# Patient Record
Sex: Male | Born: 1955 | Race: White | Hispanic: No | Marital: Married | Smoking: Former smoker
Health system: Southern US, Community
[De-identification: ages and names within clinical notes are randomized; demographics above are authoritative.]

## PROBLEM LIST (undated history)

## (undated) DIAGNOSIS — I10 Essential (primary) hypertension: Secondary | ICD-10-CM

## (undated) DIAGNOSIS — C4490 Unspecified malignant neoplasm of skin, unspecified: Secondary | ICD-10-CM

## (undated) DIAGNOSIS — I739 Peripheral vascular disease, unspecified: Secondary | ICD-10-CM

## (undated) DIAGNOSIS — M5030 Other cervical disc degeneration, unspecified cervical region: Secondary | ICD-10-CM

## (undated) DIAGNOSIS — E7800 Pure hypercholesterolemia, unspecified: Secondary | ICD-10-CM

## (undated) DIAGNOSIS — E669 Obesity, unspecified: Secondary | ICD-10-CM

## (undated) DIAGNOSIS — R519 Headache, unspecified: Secondary | ICD-10-CM

## (undated) DIAGNOSIS — I219 Acute myocardial infarction, unspecified: Secondary | ICD-10-CM

## (undated) DIAGNOSIS — R51 Headache: Secondary | ICD-10-CM

## (undated) DIAGNOSIS — G8929 Other chronic pain: Secondary | ICD-10-CM

## (undated) DIAGNOSIS — K219 Gastro-esophageal reflux disease without esophagitis: Secondary | ICD-10-CM

## (undated) DIAGNOSIS — Z1379 Encounter for other screening for genetic and chromosomal anomalies: Principal | ICD-10-CM

## (undated) DIAGNOSIS — G4730 Sleep apnea, unspecified: Secondary | ICD-10-CM

## (undated) DIAGNOSIS — Z720 Tobacco use: Secondary | ICD-10-CM

## (undated) HISTORY — PX: CARDIAC CATHETERIZATION: SHX172

## (undated) HISTORY — DX: Headache: R51

## (undated) HISTORY — DX: Encounter for other screening for genetic and chromosomal anomalies: Z13.79

## (undated) HISTORY — PX: RETINAL DETACHMENT SURGERY: SHX105

## (undated) HISTORY — DX: Obesity, unspecified: E66.9

## (undated) HISTORY — DX: Essential (primary) hypertension: I10

## (undated) HISTORY — PX: OTHER SURGICAL HISTORY: SHX169

## (undated) HISTORY — DX: Other cervical disc degeneration, unspecified cervical region: M50.30

## (undated) HISTORY — DX: Unspecified malignant neoplasm of skin, unspecified: C44.90

## (undated) HISTORY — DX: Tobacco use: Z72.0

## (undated) HISTORY — PX: HERNIA REPAIR: SHX51

## (undated) HISTORY — DX: Headache, unspecified: R51.9

## (undated) HISTORY — DX: Pure hypercholesterolemia, unspecified: E78.00

## (undated) HISTORY — DX: Other chronic pain: G89.29

---

## 2005-03-21 ENCOUNTER — Ambulatory Visit: Payer: Self-pay | Admitting: Psychiatry

## 2005-08-01 ENCOUNTER — Ambulatory Visit: Payer: Self-pay | Admitting: Internal Medicine

## 2005-08-17 ENCOUNTER — Ambulatory Visit: Payer: Self-pay | Admitting: Gastroenterology

## 2005-08-25 ENCOUNTER — Ambulatory Visit: Payer: Self-pay | Admitting: Gastroenterology

## 2005-09-12 ENCOUNTER — Ambulatory Visit: Payer: Self-pay | Admitting: General Surgery

## 2007-10-03 ENCOUNTER — Ambulatory Visit: Payer: Self-pay | Admitting: Unknown Physician Specialty

## 2008-01-25 ENCOUNTER — Emergency Department: Payer: Self-pay | Admitting: Emergency Medicine

## 2011-04-05 ENCOUNTER — Ambulatory Visit: Payer: Self-pay | Admitting: Neurology

## 2011-11-16 ENCOUNTER — Telehealth: Payer: Self-pay | Admitting: Internal Medicine

## 2011-11-16 NOTE — Telephone Encounter (Signed)
Pt is needing his Butalb-acetamin- caff refilled its for his migranes. He uses [facility] in [place].

## 2011-11-17 NOTE — Telephone Encounter (Signed)
Patient also sent in a e-mail through our website. I called and spoke with patient to let him know that we hadn't seen any refill request from [facility] and that we are sending a phone note to dr. Lorin Picket to get this medication. Please advise patient when this has been called in.

## 2011-11-17 NOTE — Telephone Encounter (Signed)
Ok to refill fioricet (butalbital-acet...) x 1

## 2011-11-18 NOTE — Telephone Encounter (Signed)
Dr. Lorin Picket called this into [facility] with 1 refill.

## 2011-11-18 NOTE — Telephone Encounter (Signed)
Pt called his pharmacy says they haven't received a fax yet and he says he has been out of his meds for a while and has a splitting headache and getting sick. He ask if we could send that in for him.

## 2012-02-02 ENCOUNTER — Telehealth: Payer: Self-pay | Admitting: Internal Medicine

## 2012-02-02 NOTE — Telephone Encounter (Signed)
Patient is wanting labs prior to his appointment on 2.10.14.

## 2012-02-03 NOTE — Telephone Encounter (Signed)
I can schedule some labs, but to be able to get a full panel - I will need to see first (per insurance).  Please call and see if he wants to just wait until appt - so I can get everything.  Thanks.   Let me know if a problem.

## 2012-02-06 NOTE — Telephone Encounter (Signed)
Spoke with pt spouse ok to have labs at appointment time

## 2012-03-02 ENCOUNTER — Encounter: Payer: Self-pay | Admitting: Internal Medicine

## 2012-03-02 DIAGNOSIS — E7800 Pure hypercholesterolemia, unspecified: Secondary | ICD-10-CM | POA: Insufficient documentation

## 2012-03-02 DIAGNOSIS — R51 Headache: Secondary | ICD-10-CM

## 2012-03-02 DIAGNOSIS — I10 Essential (primary) hypertension: Secondary | ICD-10-CM | POA: Insufficient documentation

## 2012-03-05 ENCOUNTER — Ambulatory Visit (INDEPENDENT_AMBULATORY_CARE_PROVIDER_SITE_OTHER): Payer: BC Managed Care – PPO | Admitting: Internal Medicine

## 2012-03-05 ENCOUNTER — Encounter: Payer: Self-pay | Admitting: Internal Medicine

## 2012-03-05 VITALS — BP 130/88 | HR 76 | Temp 97.8°F | Ht 69.5 in | Wt 176.8 lb

## 2012-03-05 DIAGNOSIS — F17200 Nicotine dependence, unspecified, uncomplicated: Secondary | ICD-10-CM

## 2012-03-05 DIAGNOSIS — Z860100 Personal history of colon polyps, unspecified: Secondary | ICD-10-CM

## 2012-03-05 DIAGNOSIS — E7800 Pure hypercholesterolemia, unspecified: Secondary | ICD-10-CM

## 2012-03-05 DIAGNOSIS — R51 Headache: Secondary | ICD-10-CM

## 2012-03-05 DIAGNOSIS — Z720 Tobacco use: Secondary | ICD-10-CM

## 2012-03-05 DIAGNOSIS — R3915 Urgency of urination: Secondary | ICD-10-CM

## 2012-03-05 DIAGNOSIS — I10 Essential (primary) hypertension: Secondary | ICD-10-CM

## 2012-03-05 LAB — URINALYSIS, ROUTINE W REFLEX MICROSCOPIC
Bilirubin Urine: NEGATIVE
Hgb urine dipstick: NEGATIVE
Ketones, ur: NEGATIVE
Nitrite: NEGATIVE
Total Protein, Urine: NEGATIVE

## 2012-03-05 MED ORDER — AMITRIPTYLINE HCL 50 MG PO TABS: 50.0000 mg | ORAL_TABLET | Freq: Every day | ORAL | Status: DC

## 2012-03-05 MED ORDER — BUTALBITAL-APAP-CAFFEINE 50-325-40 MG PO TABS: ORAL_TABLET | ORAL | Status: DC

## 2012-03-06 ENCOUNTER — Encounter: Payer: Self-pay | Admitting: Internal Medicine

## 2012-03-06 NOTE — Progress Notes (Signed)
  Subjective:    Patient ID: Travis Gray, male    DOB: 1955-10-29, 57 y.o.   MRN: [id number]  HPI 57 year old male with past chronic headaches, cervical spinal stenosis, hypercholesterolemia and hypertension who comes in today for a scheduled follow up.  He states he has been doing well.  Headaches stable.  No chest pain or tightness.  No sob.  No acid reflux.  Eating and drinking well.  No bowel change.  He does report occasionally noticing some increased urinary urgency.  No dysuria.  No trouble initiating stream.   No hematuria.     Past Medical History  Diagnosis Date  . Chronic headaches   . Degenerative disc disease, cervical   . Hypertension   . Hypercholesterolemia   . Tobacco abuse     Current Outpatient Prescriptions on File Prior to Visit  Medication Sig Dispense Refill  . atenolol (TENORMIN) 50 MG tablet Take 50 mg by mouth daily.      Marland Kitchen atorvastatin (LIPITOR) 10 MG tablet Take 10 mg by mouth daily.      Marland Kitchen losartan (COZAAR) 100 MG tablet Take 100 mg by mouth daily.      . OMEGA 3-6-9 FATTY ACIDS PO Take by mouth. Take 2 tablets by mouth twice daily.      Marland Kitchen omega-3 acid ethyl esters (LOVAZA) 1 G capsule Take 1 capsule by mouth four times daily.      Marland Kitchen omeprazole (PRILOSEC) 40 MG capsule Take 40 mg by mouth daily.       No current facility-administered medications on file prior to visit.    Review of Systems Patient denies any headache currently.  Headaches stable.  No lightheadedness or dizziness.  No chest pain, tightness or palpitations.  No increased shortness of breath, cough or congestion.  No nausea or vomiting.  No acid reflux.  No abdominal pain or cramping.  No bowel change, such as diarrhea, constipation, BRBPR or melana.  Urinary symptoms as outlined.        Objective:   Physical Exam Filed Vitals:   03/05/12 0823  BP: 130/88  Pulse: 76  Temp: 97.8 F (36.6 C)   Blood pressure recheck:  120/86, pulse 66  57 year old male in no acute distress.  HEENT:   Nares - clear.  Oropharynx - without lesions. NECK:  Supple.  Nontender.  No audible carotid bruit.  HEART:  Appears to be regular.   LUNGS:  No crackles or wheezing audible.  Respirations even and unlabored.   RADIAL PULSE:  Equal bilaterally.  ABDOMEN:  Soft.  Nontender.  Bowel sounds present and normal.  No audible abdominal bruit.   RECTAL:  Could not appreciate any palpable prostate nodules. Minimal tenderness - palpation of the prostate.   Heme negative.   EXTREMITIES:  No increased edema present.  DP pulses palpable and equal bilaterally.         Assessment & Plan:  URINARY HESITANCY.  Check urinalysis.  Prostate exam as outlined.    CARDIOVASCULAR.  Stress test negative 04/19/10.  Currently asymptomatic.   BACK PAIN.  Not an issue for him now.  Follow.    HEALTH MAINTENANCE.  Physical 06/15/11.  Colonoscopy 10/02/08 with 4mm polyp in the rectum and 3mm polyp in the rectum and diverticulosis.  PSA 06/08/11 - .94.

## 2012-03-07 DIAGNOSIS — Z86010 Personal history of colonic polyps: Secondary | ICD-10-CM | POA: Insufficient documentation

## 2012-03-07 DIAGNOSIS — Z720 Tobacco use: Secondary | ICD-10-CM | POA: Insufficient documentation

## 2012-03-07 NOTE — Assessment & Plan Note (Signed)
Low cholesterol diet and exercise.  Continue lipitor.  Check lipid panel and liver function.  

## 2012-03-07 NOTE — Assessment & Plan Note (Signed)
Up to date with colonoscopy.  See above.

## 2012-03-07 NOTE — Assessment & Plan Note (Signed)
Stable.  Takes fioricet prn.  Follow.

## 2012-03-07 NOTE — Assessment & Plan Note (Signed)
Discussed need for smoking cessation.  Follow.

## 2012-03-07 NOTE — Assessment & Plan Note (Signed)
Blood pressure as outlined.  Same medication regimen.  Check metabolic panel.  

## 2012-03-15 ENCOUNTER — Other Ambulatory Visit (INDEPENDENT_AMBULATORY_CARE_PROVIDER_SITE_OTHER): Payer: BC Managed Care – PPO

## 2012-03-15 DIAGNOSIS — I10 Essential (primary) hypertension: Secondary | ICD-10-CM

## 2012-03-15 LAB — LIPID PANEL
Cholesterol: 205 mg/dL — ABNORMAL HIGH (ref 0–200)
Total CHOL/HDL Ratio: 8
VLDL: 120.2 mg/dL — ABNORMAL HIGH (ref 0.0–40.0)

## 2012-03-15 LAB — BASIC METABOLIC PANEL
BUN: 20 mg/dL (ref 6–23)
CO2: 25 mEq/L (ref 19–32)
Chloride: 105 mEq/L (ref 96–112)
Creatinine, Ser: 1 mg/dL (ref 0.4–1.5)
Glucose, Bld: 101 mg/dL — ABNORMAL HIGH (ref 70–99)

## 2012-03-15 LAB — HEPATIC FUNCTION PANEL
AST: 22 U/L (ref 0–37)
Alkaline Phosphatase: 59 U/L (ref 39–117)
Bilirubin, Direct: 0 mg/dL (ref 0.0–0.3)
Total Bilirubin: 0.6 mg/dL (ref 0.3–1.2)

## 2012-03-21 ENCOUNTER — Telehealth: Payer: Self-pay | Admitting: Internal Medicine

## 2012-03-21 MED ORDER — CIPROFLOXACIN HCL 500 MG PO TABS: 500.0000 mg | ORAL_TABLET | Freq: Two times a day (BID) | ORAL | Status: DC

## 2012-03-21 NOTE — Telephone Encounter (Signed)
Message copied by Charm Barges on Wed Mar 21, 2012  6:02 PM ------      Message from: LAWS, REGINA C      Created: Mon Mar 12, 2012  3:05 PM       Patient notified as instructed by telephone. Was advised by patient that he is still having the same problem as when he was in. Patient states that when he feels the urge to go to the bathroom he has to go. Patient states that he has an upcoming lab appointment scheduled. ------

## 2012-03-21 NOTE — Telephone Encounter (Signed)
Pt was notified of lab results.  Since he is still having the issues with urination - will treat with cipro - to confirm no prostate issues.  cipro [facility].

## 2012-03-22 ENCOUNTER — Ambulatory Visit: Payer: Self-pay | Admitting: Ophthalmology

## 2012-03-22 DIAGNOSIS — I10 Essential (primary) hypertension: Secondary | ICD-10-CM

## 2012-03-23 ENCOUNTER — Ambulatory Visit: Payer: Self-pay | Admitting: Ophthalmology

## 2012-05-06 ENCOUNTER — Telehealth: Payer: Self-pay | Admitting: Internal Medicine

## 2012-05-06 MED ORDER — OMEGA-3-ACID ETHYL ESTERS 1 G PO CAPS: ORAL_CAPSULE | ORAL | Status: DC

## 2012-05-06 MED ORDER — ATORVASTATIN CALCIUM 10 MG PO TABS: 10.0000 mg | ORAL_TABLET | Freq: Every day | ORAL | Status: DC

## 2012-05-06 NOTE — Telephone Encounter (Signed)
Called in a 3 month supply of lovaza and refilled atorvastatin #90 with one refill.

## 2012-05-21 ENCOUNTER — Emergency Department: Payer: Self-pay | Admitting: Emergency Medicine

## 2012-05-21 ENCOUNTER — Telehealth: Payer: Self-pay | Admitting: Internal Medicine

## 2012-05-21 LAB — URINALYSIS, COMPLETE
Blood: NEGATIVE
Glucose,UR: NEGATIVE mg/dL (ref 0–75)
Leukocyte Esterase: NEGATIVE
Nitrite: NEGATIVE
Ph: 6 (ref 4.5–8.0)
WBC UR: 1 /HPF (ref 0–5)

## 2012-05-21 LAB — CBC
HGB: 14.6 g/dL (ref 13.0–18.0)
MCH: 32 pg (ref 26.0–34.0)
MCHC: 34.9 g/dL (ref 32.0–36.0)
MCV: 92 fL (ref 80–100)
Platelet: 185 10*3/uL (ref 150–440)
RBC: 4.57 10*6/uL (ref 4.40–5.90)
RDW: 13 % (ref 11.5–14.5)
WBC: 7.4 10*3/uL (ref 3.8–10.6)

## 2012-05-21 LAB — COMPREHENSIVE METABOLIC PANEL
Alkaline Phosphatase: 83 U/L (ref 50–136)
Anion Gap: 8 (ref 7–16)
BUN: 23 mg/dL — ABNORMAL HIGH (ref 7–18)
Bilirubin,Total: 0.4 mg/dL (ref 0.2–1.0)
Co2: 26 mmol/L (ref 21–32)
Creatinine: 0.95 mg/dL (ref 0.60–1.30)
EGFR (African American): >60
EGFR (Non-African Amer.): >60
Osmolality: 283 (ref 275–301)
Potassium: 4 mmol/L (ref 3.5–5.1)
Sodium: 140 mmol/L (ref 136–145)

## 2012-05-21 LAB — LIPASE, BLOOD: Lipase: 96 U/L (ref 73–393)

## 2012-05-21 NOTE — Telephone Encounter (Signed)
Duplicate. See other messages.

## 2012-05-21 NOTE — Telephone Encounter (Signed)
If severe excruciating abdominal pain - see other message.  Pt needs either acute care or ER where xray/scan and lab can be done and results immediately.

## 2012-05-21 NOTE — Telephone Encounter (Signed)
Patient Information:  Caller Name: [name]  Phone: [phone]  Patient: Travis Gray, Travis Gray  Gender: Male  DOB: [birth year]  Age: 57 Years  PCP: Dale Elberfeld  Office Follow Up:  Does the office need to follow up with this patient?: Yes  Instructions For The Office: OFFICE CALLER REFUSED ED... CAN PT BE SEEN BY DR Lorin Picket TODAY? PLEASE FOLLOW UP WITH CALLER  RN Note:  decreased appetite  Symptoms  Reason For Call & Symptoms: diarrhea and abdominal pain.  No vomiting  Reviewed Health History In EMR: Yes  Reviewed Medications In EMR: Yes  Reviewed Allergies In EMR: Yes  Reviewed Surgeries / Procedures: Yes  Date of Onset of Symptoms: 05/16/2012  Guideline(s) Used:  Abdominal Pain - Male  Abdominal Pain - Upper  Disposition Per Guideline:   Go to ED Now  Reason For Disposition Reached:   Severe abdominal pain (e.g., excruciating)  Advice Given:  N/A  Patient Refused Recommendation:  Patient Refused Care Advice  caller states pt will not go to the ED.  Caller wants to know if pt can see Dr Lorin Picket today

## 2012-05-21 NOTE — Telephone Encounter (Signed)
Spoke with pts wife & informed her of Dr [employer] recommendation. Wife states that they have decided to go to Acute care or ER for evaluation, imaging, & possible labs

## 2012-05-21 NOTE — Telephone Encounter (Signed)
Please advise- Your next available appt is on Wed @ 8:15. Pt refusing to go to ER per CAN

## 2012-06-12 ENCOUNTER — Other Ambulatory Visit: Payer: Self-pay | Admitting: Internal Medicine

## 2012-06-12 MED ORDER — BUTALBITAL-APAP-CAFFEINE 50-325-40 MG PO TABS: ORAL_TABLET | ORAL | Status: DC

## 2012-06-12 NOTE — Progress Notes (Signed)
Refilled fioricet #30 with no refills.   

## 2012-06-13 ENCOUNTER — Ambulatory Visit (INDEPENDENT_AMBULATORY_CARE_PROVIDER_SITE_OTHER): Payer: BC Managed Care – PPO | Admitting: Internal Medicine

## 2012-06-13 ENCOUNTER — Encounter: Payer: Self-pay | Admitting: Internal Medicine

## 2012-06-13 VITALS — BP 130/80 | HR 78 | Temp 98.5°F | Ht 69.0 in | Wt 172.0 lb

## 2012-06-13 DIAGNOSIS — Z125 Encounter for screening for malignant neoplasm of prostate: Secondary | ICD-10-CM

## 2012-06-13 DIAGNOSIS — I10 Essential (primary) hypertension: Secondary | ICD-10-CM

## 2012-06-13 DIAGNOSIS — R51 Headache: Secondary | ICD-10-CM

## 2012-06-13 DIAGNOSIS — Z720 Tobacco use: Secondary | ICD-10-CM

## 2012-06-13 DIAGNOSIS — Z1211 Encounter for screening for malignant neoplasm of colon: Secondary | ICD-10-CM

## 2012-06-13 DIAGNOSIS — E7800 Pure hypercholesterolemia, unspecified: Secondary | ICD-10-CM

## 2012-06-13 DIAGNOSIS — Z86010 Personal history of colonic polyps: Secondary | ICD-10-CM

## 2012-06-13 DIAGNOSIS — F17200 Nicotine dependence, unspecified, uncomplicated: Secondary | ICD-10-CM

## 2012-06-13 NOTE — Progress Notes (Signed)
Subjective:    Patient ID: Travis Gray, male    DOB: 1955-03-11, 57 y.o.   MRN: [id number]  HPI 57 year old male with past chronic headaches, cervical spinal stenosis, hypercholesterolemia and hypertension who comes in today to follow up on these issues as well as for a complete physical exam.   He states he has been doing well.  Headaches stable.  No chest pain or tightness.  No sob.  No acid reflux.  Eating and drinking well.  No bowel change.  Had reported occasionally noticing some increased urinary urgency.  This improved with treatment.  No dysuria.  No trouble initiating stream.   No hematuria.  Had surgery for his retina.  Did well.  Planning to have cataract surgery 06/26/12.  Has pre op tomorrow.      Past Medical History  Diagnosis Date  . Chronic headaches   . Degenerative disc disease, cervical   . Hypertension   . Hypercholesterolemia   . Tobacco abuse     Current Outpatient Prescriptions on File Prior to Visit  Medication Sig Dispense Refill  . amitriptyline (ELAVIL) 50 MG tablet Take 1 tablet (50 mg total) by mouth at bedtime.  30 tablet  5  . atenolol (TENORMIN) 50 MG tablet Take 50 mg by mouth daily.      Marland Kitchen atorvastatin (LIPITOR) 10 MG tablet Take 1 tablet (10 mg total) by mouth daily.  90 tablet  1  . butalbital-acetaminophen-caffeine (FIORICET) 50-325-40 MG per tablet As directed  30 tablet  0  . losartan (COZAAR) 100 MG tablet Take 100 mg by mouth daily.      . OMEGA 3-6-9 FATTY ACIDS PO Take by mouth. Take 2 tablets by mouth twice daily.      Marland Kitchen omega-3 acid ethyl esters (LOVAZA) 1 G capsule Take 1 capsule by mouth four times daily.  120 capsule  2  . omeprazole (PRILOSEC) 40 MG capsule Take 40 mg by mouth daily.       No current facility-administered medications on file prior to visit.    Review of Systems Patient denies any headache currently.  Headaches stable.  No lightheadedness or dizziness.  No chest pain, tightness or palpitations.  No cardiac symptoms with  increased activity or exertion.  No increased shortness of breath, cough or congestion.  No nausea or vomiting.  No acid reflux.  No abdominal pain or cramping.  No bowel change, such as diarrhea, constipation, BRBPR or melana.  Urinary symptoms improved.  Planning for cataract surgery 06/25/12.        Objective:   Physical Exam  Filed Vitals:   06/13/12 0854  BP: 130/80  Pulse: 78  Temp: 98.5 F (54.8 C)   57 year old male in no acute distress.  HEENT:  Nares - clear.  Oropharynx - without lesions. NECK:  Supple.  Nontender.  No audible carotid bruit.  HEART:  Appears to be regular.   LUNGS:  No crackles or wheezing audible.  Respirations even and unlabored.   RADIAL PULSE:  Equal bilaterally.  ABDOMEN:  Soft.  Nontender.  Bowel sounds present and normal.  No audible abdominal bruit.  GU:  Normal descended testicles.  No palpable testicular nodules.   RECTAL:  Could not appreciate any palpable prostate nodules.  Heme negative.   EXTREMITIES:  No increased edema present.  DP pulses palpable and equal bilaterally.          Assessment & Plan:  URINARY HESITANCY.  Improved.  Follow.  CARDIOVASCULAR.  Stress test negative 04/19/10.  Currently asymptomatic.   BACK PAIN.  Not an issue for him now.  Follow.    HEALTH MAINTENANCE.  Physical today.  Colonoscopy 10/02/08 with 4mm polyp in the rectum and 3mm polyp in the rectum and diverticulosis.  PSA 06/08/11 - .94.  Recheck psa with next labs.  IFOB.

## 2012-06-17 ENCOUNTER — Encounter: Payer: Self-pay | Admitting: Internal Medicine

## 2012-06-17 NOTE — Assessment & Plan Note (Signed)
Blood pressure as outlined.  Same medication regimen.  Check metabolic panel.  

## 2012-06-17 NOTE — Assessment & Plan Note (Signed)
Stable.  Takes fioricet prn.  Follow.

## 2012-06-17 NOTE — Assessment & Plan Note (Signed)
Discussed need for smoking cessation.  Follow.

## 2012-06-17 NOTE — Assessment & Plan Note (Signed)
Up to date with colonoscopy.  IFOB today.

## 2012-06-17 NOTE — Assessment & Plan Note (Signed)
Low cholesterol diet and exercise.  Continue lipitor.  Check lipid panel and liver function.  

## 2012-06-26 ENCOUNTER — Ambulatory Visit: Payer: Self-pay | Admitting: Ophthalmology

## 2012-06-27 ENCOUNTER — Ambulatory Visit: Payer: Self-pay | Admitting: Ophthalmology

## 2012-06-27 LAB — POTASSIUM: Potassium: 3.9 mmol/L (ref 3.5–5.1)

## 2012-07-04 ENCOUNTER — Other Ambulatory Visit (INDEPENDENT_AMBULATORY_CARE_PROVIDER_SITE_OTHER): Payer: BC Managed Care – PPO

## 2012-07-04 DIAGNOSIS — I10 Essential (primary) hypertension: Secondary | ICD-10-CM

## 2012-07-04 DIAGNOSIS — E7800 Pure hypercholesterolemia, unspecified: Secondary | ICD-10-CM

## 2012-07-04 DIAGNOSIS — Z125 Encounter for screening for malignant neoplasm of prostate: Secondary | ICD-10-CM

## 2012-07-04 LAB — CBC WITH DIFFERENTIAL/PLATELET
Eosinophils Relative: 2.2 % (ref 0.0–5.0)
HCT: 45 % (ref 39.0–52.0)
Lymphs Abs: 2.3 10*3/uL (ref 0.7–4.0)
MCHC: 34.6 g/dL (ref 30.0–36.0)
MCV: 95.5 fl (ref 78.0–100.0)
Monocytes Absolute: 0.6 10*3/uL (ref 0.1–1.0)
Platelets: 218 10*3/uL (ref 150.0–400.0)
WBC: 6.3 10*3/uL (ref 4.5–10.5)

## 2012-07-04 LAB — HEPATIC FUNCTION PANEL
ALT: 20 U/L (ref 0–53)
AST: 17 U/L (ref 0–37)
Alkaline Phosphatase: 65 U/L (ref 39–117)
Bilirubin, Direct: 0.1 mg/dL (ref 0.0–0.3)
Total Bilirubin: 0.7 mg/dL (ref 0.3–1.2)

## 2012-07-04 LAB — BASIC METABOLIC PANEL
BUN: 18 mg/dL (ref 6–23)
Creatinine, Ser: 1 mg/dL (ref 0.4–1.5)
GFR: 79.65 mL/min (ref >60.00)
Glucose, Bld: 98 mg/dL (ref 70–99)
Potassium: 4.5 mEq/L (ref 3.5–5.1)

## 2012-07-04 LAB — TSH: TSH: 2.79 u[IU]/mL (ref 0.35–5.50)

## 2012-07-04 LAB — LIPID PANEL: VLDL: 98.8 mg/dL — ABNORMAL HIGH (ref 0.0–40.0)

## 2012-07-05 ENCOUNTER — Other Ambulatory Visit: Payer: Self-pay | Admitting: Internal Medicine

## 2012-07-05 ENCOUNTER — Encounter: Payer: Self-pay | Admitting: *Deleted

## 2012-07-05 DIAGNOSIS — I10 Essential (primary) hypertension: Secondary | ICD-10-CM

## 2012-07-05 DIAGNOSIS — E7800 Pure hypercholesterolemia, unspecified: Secondary | ICD-10-CM

## 2012-07-05 NOTE — Progress Notes (Signed)
Orders placed for f/u labs.  

## 2012-07-20 ENCOUNTER — Other Ambulatory Visit: Payer: Self-pay | Admitting: *Deleted

## 2012-07-20 MED ORDER — ATENOLOL 50 MG PO TABS: 50.0000 mg | ORAL_TABLET | Freq: Every day | ORAL | Status: DC

## 2012-08-27 ENCOUNTER — Other Ambulatory Visit: Payer: Self-pay | Admitting: *Deleted

## 2012-08-27 MED ORDER — AMITRIPTYLINE HCL 50 MG PO TABS: 50.0000 mg | ORAL_TABLET | Freq: Every day | ORAL | Status: DC

## 2012-09-17 ENCOUNTER — Encounter: Payer: Self-pay | Admitting: *Deleted

## 2012-09-17 ENCOUNTER — Other Ambulatory Visit: Payer: Self-pay | Admitting: *Deleted

## 2012-09-17 MED ORDER — BUTALBITAL-APAP-CAFFEINE 50-325-40 MG PO TABS: ORAL_TABLET | ORAL | Status: DC

## 2012-09-18 ENCOUNTER — Telehealth: Payer: Self-pay | Admitting: *Deleted

## 2012-09-18 NOTE — Telephone Encounter (Signed)
Notify pt that it has been shown that magnesium can help with headaches.  Would recommend taking 600mg  daily and see if this will prevent headaches from occurring.  This is not an abortive medication.  He may still need something like to fioricet occasionally to stop the headache if it occurs.  I don't know of an otc equivalent to the fioricet.  Some of the headache medications that are otc have caffeine in them (which is one of the ingredients of fioricet)

## 2012-09-18 NOTE — Telephone Encounter (Signed)
Pt came in, and brought back the Rx (Fioricet), he stated he didn't know that was a controlled substance and that he didn't want to be doing UDS, and asked if we could shred the contract since he brought back the Rx and would like to either get something else prescribed or a suggestion of something OTC

## 2012-09-19 ENCOUNTER — Telehealth: Payer: Self-pay | Admitting: *Deleted

## 2012-09-19 NOTE — Telephone Encounter (Signed)
Pt notified to try the Magnesium 600mg  daily for preventative measures. And to try something otc (such as Excedrine Migraine) during episodes of headaches.

## 2012-09-21 NOTE — Telephone Encounter (Signed)
duplicate

## 2012-10-11 ENCOUNTER — Other Ambulatory Visit (INDEPENDENT_AMBULATORY_CARE_PROVIDER_SITE_OTHER): Payer: BC Managed Care – PPO

## 2012-10-11 DIAGNOSIS — I10 Essential (primary) hypertension: Secondary | ICD-10-CM

## 2012-10-11 DIAGNOSIS — E7800 Pure hypercholesterolemia, unspecified: Secondary | ICD-10-CM

## 2012-10-11 LAB — BASIC METABOLIC PANEL
BUN: 14 mg/dL (ref 6–23)
CO2: 27 mEq/L (ref 19–32)
Chloride: 108 mEq/L (ref 96–112)
Glucose, Bld: 102 mg/dL — ABNORMAL HIGH (ref 70–99)
Potassium: 4.5 mEq/L (ref 3.5–5.1)
Sodium: 140 mEq/L (ref 135–145)

## 2012-10-11 LAB — HEPATIC FUNCTION PANEL
Alkaline Phosphatase: 54 U/L (ref 39–117)
Bilirubin, Direct: 0 mg/dL (ref 0.0–0.3)

## 2012-10-11 LAB — LIPID PANEL
HDL: 28.2 mg/dL — ABNORMAL LOW (ref >39.00)
Total CHOL/HDL Ratio: 6
VLDL: 71 mg/dL — ABNORMAL HIGH (ref 0.0–40.0)

## 2012-10-15 ENCOUNTER — Ambulatory Visit (INDEPENDENT_AMBULATORY_CARE_PROVIDER_SITE_OTHER): Payer: BC Managed Care – PPO | Admitting: Internal Medicine

## 2012-10-15 ENCOUNTER — Encounter: Payer: Self-pay | Admitting: Internal Medicine

## 2012-10-15 VITALS — BP 120/90 | HR 59 | Temp 97.6°F | Ht 69.0 in | Wt 175.2 lb

## 2012-10-15 DIAGNOSIS — Z720 Tobacco use: Secondary | ICD-10-CM

## 2012-10-15 DIAGNOSIS — Z860100 Personal history of colon polyps, unspecified: Secondary | ICD-10-CM

## 2012-10-15 DIAGNOSIS — R51 Headache: Secondary | ICD-10-CM

## 2012-10-15 DIAGNOSIS — I10 Essential (primary) hypertension: Secondary | ICD-10-CM

## 2012-10-15 DIAGNOSIS — E7800 Pure hypercholesterolemia, unspecified: Secondary | ICD-10-CM

## 2012-10-15 DIAGNOSIS — M549 Dorsalgia, unspecified: Secondary | ICD-10-CM

## 2012-10-15 DIAGNOSIS — R0781 Pleurodynia: Secondary | ICD-10-CM

## 2012-10-15 DIAGNOSIS — F17200 Nicotine dependence, unspecified, uncomplicated: Secondary | ICD-10-CM

## 2012-10-15 DIAGNOSIS — R079 Chest pain, unspecified: Secondary | ICD-10-CM

## 2012-10-15 MED ORDER — LOSARTAN POTASSIUM-HCTZ 100-12.5 MG PO TABS: 1.0000 | ORAL_TABLET | Freq: Every day | ORAL | Status: DC

## 2012-10-15 MED ORDER — ATENOLOL 50 MG PO TABS: 50.0000 mg | ORAL_TABLET | Freq: Every day | ORAL | Status: DC

## 2012-10-15 MED ORDER — OMEPRAZOLE 40 MG PO CPDR: 40.0000 mg | DELAYED_RELEASE_CAPSULE | Freq: Every day | ORAL | Status: DC

## 2012-10-15 MED ORDER — ATORVASTATIN CALCIUM 10 MG PO TABS: 10.0000 mg | ORAL_TABLET | Freq: Every day | ORAL | Status: DC

## 2012-10-15 MED ORDER — OMEGA-3-ACID ETHYL ESTERS 1 G PO CAPS: ORAL_CAPSULE | ORAL | Status: DC

## 2012-10-15 MED ORDER — AMITRIPTYLINE HCL 50 MG PO TABS: 50.0000 mg | ORAL_TABLET | Freq: Every day | ORAL | Status: DC

## 2012-10-16 ENCOUNTER — Encounter: Payer: Self-pay | Admitting: Internal Medicine

## 2012-10-16 NOTE — Assessment & Plan Note (Addendum)
Have discussed need for smoking cessation.  Follow.

## 2012-10-16 NOTE — Assessment & Plan Note (Signed)
Stable.  Off fioricet and doing well.  Follow.   

## 2012-10-16 NOTE — Assessment & Plan Note (Signed)
Low cholesterol diet and exercise.  Continue lipitor and lovaza.  Triglycerides continuing to improve.  Follow.

## 2012-10-16 NOTE — Progress Notes (Signed)
  Subjective:    Patient ID: Travis Gray, male    DOB: 12/03/1955, 57 y.o.   MRN: [id number]  HPI 57 year old male with past chronic headaches, cervical spinal stenosis, hypercholesterolemia and hypertension who comes in today for a scheduled follow up.  He states he has been doing well.  Headaches are not a significant issue now.  Off fioricet.  No chest pain or tightness.  No sob.  No acid reflux.  Eating and drinking well.  No bowel change.   Had surgery for his retina x 2 and cataract surgery.  Still having issues with his eye.  S/p recent laser surgery. Has follow up planned with opthalmology 10/23/12.  Increased stress recently.  Feels he is handling things relatively well.  Does report has been out of his blood pressure medication for two weeks.  Was having problems getting filled.     Past Medical History  Diagnosis Date  . Chronic headaches   . Degenerative disc disease, cervical   . Hypertension   . Hypercholesterolemia   . Tobacco abuse     Current Outpatient Prescriptions on File Prior to Visit  Medication Sig Dispense Refill  . OMEGA 3-6-9 FATTY ACIDS PO Take by mouth. Take 2 tablets by mouth twice daily.       No current facility-administered medications on file prior to visit.    Review of Systems Patient denies any headache currently.  Headaches better.  Off fioricet.  No lightheadedness or dizziness.  No chest pain, tightness or palpitations.  No cardiac symptoms with increased activity or exertion.  No increased shortness of breath, cough or congestion.  No nausea or vomiting.  No acid reflux.  No abdominal pain or cramping.  No bowel change, such as diarrhea, constipation, BRBPR or melana.  Persistent eye issues as outlined.  Out of blood pressure medication.         Objective:   Physical Exam  Filed Vitals:   10/15/12 0854  BP: 120/90  Pulse: 59  Temp: 97.6 F (36.4 C)   Blood pressure recheck:  150/94 pulse 52  57 year old male in no acute distress.  HEENT:   Nares - clear.  Oropharynx - without lesions. NECK:  Supple.  Nontender.  No audible carotid bruit.  HEART:  Appears to be regular.   LUNGS:  No crackles or wheezing audible.  Respirations even and unlabored.   RADIAL PULSE:  Equal bilaterally.  ABDOMEN:  Soft.  Nontender.  Bowel sounds present and normal.  No audible abdominal bruit. EXTREMITIES:  No increased edema present.  DP pulses palpable and equal bilaterally.          Assessment & Plan:  CARDIOVASCULAR.  Stress test negative 04/19/10.  Currently asymptomatic.   BACK PAIN.  Not an issue for him now.  Follow.    HEALTH MAINTENANCE.  Physical last visit.  Colonoscopy 10/02/08 with 4mm polyp in the rectum and 3mm polyp in the rectum and diverticulosis.  PSA 07/04/12 - 1/12.

## 2012-10-16 NOTE — Assessment & Plan Note (Signed)
Up to date with colonoscopy.   

## 2012-10-16 NOTE — Assessment & Plan Note (Addendum)
Blood pressure as outlined.  Off Losartan/HCTZ.  Restart.  Follow pressures closely.   Follow metabolic panel.

## 2012-11-27 ENCOUNTER — Encounter: Payer: Self-pay | Admitting: *Deleted

## 2012-11-28 ENCOUNTER — Encounter: Payer: Self-pay | Admitting: Internal Medicine

## 2012-11-28 ENCOUNTER — Ambulatory Visit (INDEPENDENT_AMBULATORY_CARE_PROVIDER_SITE_OTHER): Payer: BC Managed Care – PPO | Admitting: Internal Medicine

## 2012-11-28 VITALS — BP 140/90 | HR 59 | Temp 98.1°F | Ht 69.0 in | Wt 172.2 lb

## 2012-11-28 DIAGNOSIS — Z720 Tobacco use: Secondary | ICD-10-CM

## 2012-11-28 DIAGNOSIS — F17200 Nicotine dependence, unspecified, uncomplicated: Secondary | ICD-10-CM

## 2012-11-28 DIAGNOSIS — M25521 Pain in right elbow: Secondary | ICD-10-CM

## 2012-11-28 DIAGNOSIS — E7800 Pure hypercholesterolemia, unspecified: Secondary | ICD-10-CM

## 2012-11-28 DIAGNOSIS — R51 Headache: Secondary | ICD-10-CM

## 2012-11-28 DIAGNOSIS — I10 Essential (primary) hypertension: Secondary | ICD-10-CM

## 2012-11-28 DIAGNOSIS — M25529 Pain in unspecified elbow: Secondary | ICD-10-CM

## 2012-11-28 DIAGNOSIS — Z86010 Personal history of colonic polyps: Secondary | ICD-10-CM

## 2012-11-28 MED ORDER — ATORVASTATIN CALCIUM 10 MG PO TABS: 10.0000 mg | ORAL_TABLET | Freq: Every day | ORAL | Status: DC

## 2012-11-28 MED ORDER — LOSARTAN POTASSIUM-HCTZ 100-25 MG PO TABS: 1.0000 | ORAL_TABLET | Freq: Every day | ORAL | Status: DC

## 2012-11-28 NOTE — Assessment & Plan Note (Addendum)
Blood pressure as outlined.  Will change losartan to 100/25 q day.  Follow pressures closely.   Follow metabolic panel.  Get him back in soon to reassess.

## 2012-11-28 NOTE — Progress Notes (Signed)
Subjective:    Patient ID: Travis Gray, male    DOB: 05-14-1955, 57 y.o.   MRN: [id number]  HPI 57 year old male with past chronic headaches, cervical spinal stenosis, hypercholesterolemia and hypertension who comes in today for a scheduled follow up.  He states he has been doing well.  Headaches are not a significant issue now. Off fioricet.  No chest pain or tightness.  No sob.  No acid reflux.  Eating and drinking well.  No bowel change.   Had surgery for his retina x 2 and cataract surgery.  Still having issues with his eye.  S/p recent laser surgery.  Still seeing opthalmology.  ncreased stress recently.  Feels he is handling things relatively well.  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  Using arms a lot.  Now having increased right elbow pain - especially with twisting movements.      Past Medical History  Diagnosis Date  . Chronic headaches   . Degenerative disc disease, cervical   . Hypertension   . Hypercholesterolemia   . Tobacco abuse     Current Outpatient Prescriptions on File Prior to Visit  Medication Sig Dispense Refill  . amitriptyline (ELAVIL) 50 MG tablet Take 1 tablet (50 mg total) by mouth at bedtime.  30 tablet  5  . atenolol (TENORMIN) 50 MG tablet Take 1 tablet (50 mg total) by mouth daily.  30 tablet  5  . atorvastatin (LIPITOR) 10 MG tablet Take 1 tablet (10 mg total) by mouth daily.  30 tablet  5  . losartan-hydrochlorothiazide (HYZAAR) 100-12.5 MG per tablet Take 1 tablet by mouth daily.  30 tablet  5  . OMEGA 3-6-9 FATTY ACIDS PO Take by mouth. Take 2 tablets by mouth twice daily.      Marland Kitchen omega-3 acid ethyl esters (LOVAZA) 1 G capsule Take 1 capsule by mouth four times daily.  120 capsule  4  . omeprazole (PRILOSEC) 40 MG capsule Take 1 capsule (40 mg total) by mouth daily.  30 capsule  5   No current facility-administered medications on file prior to visit.    Review of Systems Patient denies any headache  currently.  Headaches better.  Off fioricet.  No lightheadedness or dizziness.  No chest pain, tightness or palpitations.  No cardiac symptoms with increased activity or exertion.  No increased shortness of breath, cough or congestion.  No nausea or vomiting.  No acid reflux.  No abdominal pain or cramping.  No bowel change, such as diarrhea, constipation, BRBPR or melana.  Persistent eye issues as outlined.  Back on his blood pressure medication.  Taking regularly.  Blood pressure still elevated.  Increased stress.  Feels he is coping well.  Right elbow pain as outlined.           Objective:   Physical Exam  Filed Vitals:   11/28/12 0801  BP: 140/90  Pulse: 59  Temp: 98.1 F (36.7 C)   Blood pressure recheck:  33/37  57 year old male in no acute distress.  HEENT:  Nares - clear.  Oropharynx - without lesions. NECK:  Supple.  Nontender.  No audible carotid bruit.  HEART:  Appears to be regular.   LUNGS:  No crackles or wheezing audible.  Respirations even and unlabored.   RADIAL PULSE:  Equal bilaterally.  ABDOMEN:  Soft.  Nontender.  Bowel sounds present and normal.  No audible abdominal bruit. EXTREMITIES:  No increased edema present.  DP pulses palpable and equal bilaterally.          Assessment & Plan:  CARDIOVASCULAR.  Stress test negative 04/19/10.  Currently asymptomatic.   BACK PAIN.  Not an issue for him now.  Follow.    HEALTH MAINTENANCE.  Physical 06/13/12.  Colonoscopy 10/02/08 with 4mm polyp in the rectum and 3mm polyp in the rectum and diverticulosis.  PSA 07/04/12 - 1/12.

## 2012-11-28 NOTE — Progress Notes (Signed)
Pre-visit discussion using our clinic review tool. No additional management support is needed unless otherwise documented below in the visit note.  

## 2012-12-02 ENCOUNTER — Encounter: Payer: Self-pay | Admitting: Internal Medicine

## 2012-12-02 DIAGNOSIS — M25521 Pain in right elbow: Secondary | ICD-10-CM | POA: Insufficient documentation

## 2012-12-02 NOTE — Assessment & Plan Note (Signed)
Up to date with colonoscopy.   

## 2012-12-02 NOTE — Assessment & Plan Note (Signed)
Low cholesterol diet and exercise.  Continue lipitor and lovaza.  Triglycerides continuing to improve.  Follow.

## 2012-12-02 NOTE — Assessment & Plan Note (Signed)
Have discussed need for smoking cessation.  Discussed with him again today.  Not ready to quit.  Follow.     

## 2012-12-02 NOTE — Assessment & Plan Note (Signed)
Persistent pain.  Using an elbow strap.  Will try to avoid increased antiinflammatories given his blood pressure changes.  Tylenol as directed.  Discussed possible injection.  He wants to hold at this point.  Follow.  Will call with update over the next 2-3 weeks.

## 2012-12-02 NOTE — Assessment & Plan Note (Signed)
Stable.  Off fioricet and doing well.  Follow.   

## 2013-02-01 ENCOUNTER — Encounter: Payer: Self-pay | Admitting: Internal Medicine

## 2013-02-01 ENCOUNTER — Encounter (INDEPENDENT_AMBULATORY_CARE_PROVIDER_SITE_OTHER): Payer: Self-pay

## 2013-02-01 ENCOUNTER — Other Ambulatory Visit: Payer: Self-pay | Admitting: Internal Medicine

## 2013-02-01 ENCOUNTER — Ambulatory Visit (INDEPENDENT_AMBULATORY_CARE_PROVIDER_SITE_OTHER): Payer: BC Managed Care – PPO | Admitting: Internal Medicine

## 2013-02-01 VITALS — BP 120/80 | HR 60 | Temp 98.3°F | Ht 69.0 in | Wt 168.5 lb

## 2013-02-01 DIAGNOSIS — Z720 Tobacco use: Secondary | ICD-10-CM

## 2013-02-01 DIAGNOSIS — Z86010 Personal history of colonic polyps: Secondary | ICD-10-CM

## 2013-02-01 DIAGNOSIS — E7800 Pure hypercholesterolemia, unspecified: Secondary | ICD-10-CM

## 2013-02-01 DIAGNOSIS — M25521 Pain in right elbow: Secondary | ICD-10-CM

## 2013-02-01 DIAGNOSIS — M25529 Pain in unspecified elbow: Secondary | ICD-10-CM

## 2013-02-01 DIAGNOSIS — R51 Headache: Secondary | ICD-10-CM

## 2013-02-01 DIAGNOSIS — F17200 Nicotine dependence, unspecified, uncomplicated: Secondary | ICD-10-CM

## 2013-02-01 DIAGNOSIS — I10 Essential (primary) hypertension: Secondary | ICD-10-CM

## 2013-02-01 NOTE — Progress Notes (Signed)
Subjective:    Patient ID: Travis Gray, male    DOB: February 16, 1955, 58 y.o.   MRN: [id number]  HPI 58 year old male with past history of chronic headaches, cervical spinal stenosis, hypercholesterolemia and hypertension who comes in today for a scheduled follow up.  He states he has been doing well.  Headaches are not a significant issue now. Off fioricet.  No chest pain or tightness.  No sob.  No acid reflux.  Eating and drinking well.  No bowel change.   Had surgery for his retina x 2 and cataract surgery.  S/p recent laser surgery.  Still seeing opthalmology.  Increased stress recently.  Feels he is handling things relatively well.  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       Past Medical History  Diagnosis Date  . Chronic headaches   . Degenerative disc disease, cervical   . Hypertension   . Hypercholesterolemia   . Tobacco abuse     Current Outpatient Prescriptions on File Prior to Visit  Medication Sig Dispense Refill  . amitriptyline (ELAVIL) 50 MG tablet Take 1 tablet (50 mg total) by mouth at bedtime.  30 tablet  5  . atenolol (TENORMIN) 50 MG tablet Take 1 tablet (50 mg total) by mouth daily.  30 tablet  5  . atorvastatin (LIPITOR) 10 MG tablet Take 1 tablet (10 mg total) by mouth daily.  30 tablet  5  . losartan-hydrochlorothiazide (HYZAAR) 100-25 MG per tablet Take 1 tablet by mouth daily.  30 tablet  5  . OMEGA 3-6-9 FATTY ACIDS PO Take by mouth. Take 2 tablets by mouth twice daily.      Marland Kitchen omega-3 acid ethyl esters (LOVAZA) 1 G capsule Take 1 capsule by mouth four times daily.  120 capsule  4  . omeprazole (PRILOSEC) 40 MG capsule Take 1 capsule (40 mg total) by mouth daily.  30 capsule  5   No current facility-administered medications on file prior to visit.    Review of Systems Patient denies any headache currently.  Headaches better.  Off fioricet.  No lightheadedness or dizziness.  No chest pain, tightness or palpitations.  No cardiac symptoms with  increased activity or exertion.  No increased shortness of breath, cough or congestion.  No nausea or vomiting.  No acid reflux.  No abdominal pain or cramping.  No bowel change, such as diarrhea, constipation, BRBPR or melana.  Increased stress.  Feels he is coping well.  Stress better.  Right elbow pain persist.  He is still wearing the strap.  Desires no further w/up at this point.          Objective:   Physical Exam  Filed Vitals:   02/01/13 0823  BP: 120/80  Pulse: 60  Temp: 98.3 F (36.8 C)   Blood pressure recheck:  39/55  58 year old male in no acute distress.  HEENT:  Nares - clear.  Oropharynx - without lesions. NECK:  Supple.  Nontender.  No audible carotid bruit.  HEART:  Appears to be regular.   LUNGS:  No crackles or wheezing audible.  Respirations even and unlabored.   RADIAL PULSE:  Equal bilaterally.  ABDOMEN:  Soft.  Nontender.  Bowel sounds present and normal.  No audible abdominal bruit. EXTREMITIES:  No increased edema present.  DP pulses palpable and equal bilaterally.          Assessment & Plan:  CARDIOVASCULAR.  Stress test negative 04/19/10.  Currently asymptomatic.   BACK PAIN.  Not an issue for him now.  Follow.    HEALTH MAINTENANCE.  Physical 06/13/12.  Colonoscopy 10/02/08 with 59mm polyp in the rectum and 85mm polyp in the rectum and diverticulosis.  PSA 07/04/12 - 1/12.

## 2013-02-01 NOTE — Progress Notes (Signed)
Pre-visit discussion using our clinic review tool. No additional management support is needed unless otherwise documented below in the visit note.  

## 2013-02-01 NOTE — Progress Notes (Signed)
Order placed for labs.

## 2013-02-03 ENCOUNTER — Encounter: Payer: Self-pay | Admitting: Internal Medicine

## 2013-02-03 NOTE — Assessment & Plan Note (Signed)
Up to date with colonoscopy.   

## 2013-02-03 NOTE — Assessment & Plan Note (Signed)
Persistent pain.  Using an elbow strap.  Tylenol as directed.  Discussed possible injection.  He wants to hold at this point.  Follow.

## 2013-02-03 NOTE — Assessment & Plan Note (Signed)
Low cholesterol diet and exercise.  Continue lipitor and lovaza.  Follow.

## 2013-02-03 NOTE — Assessment & Plan Note (Signed)
Have discussed need for smoking cessation.  Discussed with him again today.  Not ready to quit.  Follow.

## 2013-02-03 NOTE — Assessment & Plan Note (Signed)
Blood pressure doing better on losartan 100/25 q day.  Follow pressures closely.   Follow metabolic panel.    

## 2013-02-03 NOTE — Assessment & Plan Note (Signed)
Stable.  Off fioricet and doing well.  Follow.   

## 2013-02-18 ENCOUNTER — Other Ambulatory Visit (INDEPENDENT_AMBULATORY_CARE_PROVIDER_SITE_OTHER): Payer: BC Managed Care – PPO

## 2013-02-18 ENCOUNTER — Other Ambulatory Visit: Payer: Self-pay | Admitting: Internal Medicine

## 2013-02-18 DIAGNOSIS — I10 Essential (primary) hypertension: Secondary | ICD-10-CM

## 2013-02-18 DIAGNOSIS — E7800 Pure hypercholesterolemia, unspecified: Secondary | ICD-10-CM

## 2013-02-18 LAB — HEPATIC FUNCTION PANEL
ALBUMIN: 3.7 g/dL (ref 3.5–5.2)
ALT: 20 U/L (ref 0–53)
AST: 21 U/L (ref 0–37)
Alkaline Phosphatase: 63 U/L (ref 39–117)
Bilirubin, Direct: 0 mg/dL (ref 0.0–0.3)
TOTAL PROTEIN: 6.3 g/dL (ref 6.0–8.3)
Total Bilirubin: 0.6 mg/dL (ref 0.3–1.2)

## 2013-02-18 LAB — BASIC METABOLIC PANEL
BUN: 17 mg/dL (ref 6–23)
CALCIUM: 9.2 mg/dL (ref 8.4–10.5)
CO2: 31 mEq/L (ref 19–32)
CREATININE: 1 mg/dL (ref 0.4–1.5)
Chloride: 103 mEq/L (ref 96–112)
GFR: 80.36 mL/min (ref >60.00)
Glucose, Bld: 97 mg/dL (ref 70–99)
Potassium: 4.4 mEq/L (ref 3.5–5.1)
Sodium: 139 mEq/L (ref 135–145)

## 2013-02-18 LAB — LIPID PANEL
Cholesterol: 173 mg/dL (ref 0–200)
HDL: 29.9 mg/dL — ABNORMAL LOW (ref >39.00)
TRIGLYCERIDES: 525 mg/dL — AB (ref 0.0–149.0)
Total CHOL/HDL Ratio: 6
VLDL: 105 mg/dL — ABNORMAL HIGH (ref 0.0–40.0)

## 2013-02-18 LAB — LDL CHOLESTEROL, DIRECT: LDL DIRECT: 69.9 mg/dL

## 2013-02-18 NOTE — Progress Notes (Signed)
Orders placed for f/u labs.  

## 2013-04-24 ENCOUNTER — Other Ambulatory Visit (INDEPENDENT_AMBULATORY_CARE_PROVIDER_SITE_OTHER): Payer: BC Managed Care – PPO

## 2013-04-24 DIAGNOSIS — E7800 Pure hypercholesterolemia, unspecified: Secondary | ICD-10-CM

## 2013-04-24 LAB — LIPID PANEL
CHOLESTEROL: 187 mg/dL (ref 0–200)
HDL: 31.5 mg/dL — ABNORMAL LOW (ref >39.00)
LDL Cholesterol: 60 mg/dL (ref 0–99)
Total CHOL/HDL Ratio: 6
Triglycerides: 477 mg/dL — ABNORMAL HIGH (ref 0.0–149.0)
VLDL: 95.4 mg/dL — ABNORMAL HIGH (ref 0.0–40.0)

## 2013-04-24 LAB — URINALYSIS, ROUTINE W REFLEX MICROSCOPIC
Bilirubin Urine: NEGATIVE
Hgb urine dipstick: NEGATIVE
Ketones, ur: NEGATIVE
Leukocytes, UA: NEGATIVE
NITRITE: NEGATIVE
PH: 6 (ref 5.0–8.0)
SPECIFIC GRAVITY, URINE: 1.025 (ref 1.000–1.030)
Total Protein, Urine: NEGATIVE
UROBILINOGEN UA: 0.2 (ref 0.0–1.0)
Urine Glucose: NEGATIVE

## 2013-04-24 LAB — AST: AST: 19 U/L (ref 0–37)

## 2013-04-24 LAB — ALT: ALT: 18 U/L (ref 0–53)

## 2013-04-29 ENCOUNTER — Encounter: Payer: Self-pay | Admitting: *Deleted

## 2013-05-02 ENCOUNTER — Other Ambulatory Visit: Payer: Self-pay | Admitting: *Deleted

## 2013-05-02 MED ORDER — ATENOLOL 50 MG PO TABS: 50.0000 mg | ORAL_TABLET | Freq: Every day | ORAL | Status: DC

## 2013-05-02 MED ORDER — AMITRIPTYLINE HCL 50 MG PO TABS: 50.0000 mg | ORAL_TABLET | Freq: Every day | ORAL | Status: DC

## 2013-05-02 MED ORDER — LOSARTAN POTASSIUM-HCTZ 100-25 MG PO TABS: 1.0000 | ORAL_TABLET | Freq: Every day | ORAL | Status: DC

## 2013-05-02 MED ORDER — ATORVASTATIN CALCIUM 10 MG PO TABS: 10.0000 mg | ORAL_TABLET | Freq: Every day | ORAL | Status: DC

## 2013-05-02 NOTE — Telephone Encounter (Signed)
Refilled elavil #30 with 2 refills.

## 2013-05-02 NOTE — Telephone Encounter (Signed)
Please advise as to refill amitriptyline?

## 2013-05-02 NOTE — Telephone Encounter (Signed)
Please advise.  Ok to fill? 

## 2013-06-04 ENCOUNTER — Telehealth: Payer: Self-pay | Admitting: Internal Medicine

## 2013-06-04 NOTE — Telephone Encounter (Signed)
Needing called to [facility] Lipitor atorvastatin 10 mg Losartan HCTZ 125 mg

## 2013-06-04 NOTE — Telephone Encounter (Signed)
Pt did not call pharmacy first. Advised that 6 months of refills were sent last month, confirmed receipt by [facility]. Pt will call the pharmacy for refills.

## 2013-06-18 ENCOUNTER — Ambulatory Visit (INDEPENDENT_AMBULATORY_CARE_PROVIDER_SITE_OTHER): Payer: BC Managed Care – PPO | Admitting: Internal Medicine

## 2013-06-18 ENCOUNTER — Other Ambulatory Visit: Payer: Self-pay | Admitting: *Deleted

## 2013-06-18 ENCOUNTER — Encounter: Payer: Self-pay | Admitting: Internal Medicine

## 2013-06-18 VITALS — BP 120/70 | HR 62 | Temp 98.3°F | Ht 68.5 in | Wt 171.2 lb

## 2013-06-18 DIAGNOSIS — R51 Headache: Secondary | ICD-10-CM

## 2013-06-18 DIAGNOSIS — F17200 Nicotine dependence, unspecified, uncomplicated: Secondary | ICD-10-CM

## 2013-06-18 DIAGNOSIS — M25511 Pain in right shoulder: Secondary | ICD-10-CM | POA: Insufficient documentation

## 2013-06-18 DIAGNOSIS — M25522 Pain in left elbow: Secondary | ICD-10-CM | POA: Insufficient documentation

## 2013-06-18 DIAGNOSIS — M25521 Pain in right elbow: Secondary | ICD-10-CM

## 2013-06-18 DIAGNOSIS — Z720 Tobacco use: Secondary | ICD-10-CM

## 2013-06-18 DIAGNOSIS — E7800 Pure hypercholesterolemia, unspecified: Secondary | ICD-10-CM

## 2013-06-18 DIAGNOSIS — Z86010 Personal history of colonic polyps: Secondary | ICD-10-CM

## 2013-06-18 DIAGNOSIS — M25529 Pain in unspecified elbow: Secondary | ICD-10-CM

## 2013-06-18 DIAGNOSIS — R350 Frequency of micturition: Secondary | ICD-10-CM

## 2013-06-18 DIAGNOSIS — I10 Essential (primary) hypertension: Secondary | ICD-10-CM

## 2013-06-18 LAB — URINALYSIS, ROUTINE W REFLEX MICROSCOPIC
BILIRUBIN URINE: NEGATIVE
Hgb urine dipstick: NEGATIVE
Ketones, ur: NEGATIVE
Leukocytes, UA: NEGATIVE
NITRITE: NEGATIVE
RBC / HPF: NONE SEEN (ref 0–?)
Specific Gravity, Urine: 1.025 (ref 1.000–1.030)
TOTAL PROTEIN, URINE-UPE24: NEGATIVE
Urine Glucose: NEGATIVE
Urobilinogen, UA: 0.2 (ref 0.0–1.0)
pH: 6 (ref 5.0–8.0)

## 2013-06-18 LAB — BASIC METABOLIC PANEL
BUN: 25 mg/dL — ABNORMAL HIGH (ref 6–23)
CO2: 30 meq/L (ref 19–32)
CREATININE: 1.2 mg/dL (ref 0.4–1.5)
Calcium: 9.3 mg/dL (ref 8.4–10.5)
Chloride: 102 mEq/L (ref 96–112)
GFR: 67.19 mL/min (ref >60.00)
GLUCOSE: 97 mg/dL (ref 70–99)
Potassium: 4.2 mEq/L (ref 3.5–5.1)
Sodium: 139 mEq/L (ref 135–145)

## 2013-06-18 LAB — PSA: PSA: 1.2 ng/mL (ref 0.10–4.00)

## 2013-06-18 LAB — HEMOGLOBIN A1C: Hgb A1c MFr Bld: 5.6 % (ref 4.6–6.5)

## 2013-06-18 MED ORDER — OMEPRAZOLE 40 MG PO CPDR: 40.0000 mg | DELAYED_RELEASE_CAPSULE | Freq: Every day | ORAL | Status: DC

## 2013-06-18 MED ORDER — AMITRIPTYLINE HCL 50 MG PO TABS: 50.0000 mg | ORAL_TABLET | Freq: Every day | ORAL | Status: DC

## 2013-06-18 NOTE — Progress Notes (Signed)
Pre visit review using our clinic review tool, if applicable. No additional management support is needed unless otherwise documented below in the visit note. 

## 2013-06-18 NOTE — Progress Notes (Signed)
Subjective:    Patient ID: Travis Gray, male    DOB: Oct 26, 1955, 58 y.o.   MRN: [id number]  HPI 58 year old male with past history of chronic headaches, cervical spinal stenosis, hypercholesterolemia and hypertension who comes in today to follow up on these issues as well as for a complete physical exam.   Headaches are not a significant issue now.  Off fioricet.  No chest pain or tightness.  No sob.  No acid reflux.  Eating and drinking well.  No bowel change.   Had surgery for his retina x 2 and cataract surgery.  S/p recent laser surgery.  Still seeing opthalmology.   Less stress.  He has recently noticed some increased urination.  Present for the last 1-2 months.  Does drink a lot of water.  Still having right elbow and right shoulder pain.  Worse flares intermittently.  Has not been wearing the elbow strap recently.  Plans to restart.  Desires no further intervention at this time.  Also reports he is trying to cut back on his smoking.  Has cut down to 1/2 pack per day.  Plans to decrease more.       Past Medical History  Diagnosis Date  . Chronic headaches   . Degenerative disc disease, cervical   . Hypertension   . Hypercholesterolemia   . Tobacco abuse     Current Outpatient Prescriptions on File Prior to Visit  Medication Sig Dispense Refill  . amitriptyline (ELAVIL) 50 MG tablet Take 1 tablet (50 mg total) by mouth at bedtime.  30 tablet  2  . atenolol (TENORMIN) 50 MG tablet Take 1 tablet (50 mg total) by mouth daily.  30 tablet  5  . atorvastatin (LIPITOR) 10 MG tablet Take 1 tablet (10 mg total) by mouth daily.  30 tablet  5  . losartan-hydrochlorothiazide (HYZAAR) 100-25 MG per tablet Take 1 tablet by mouth daily.  30 tablet  5  . OMEGA 3-6-9 FATTY ACIDS PO Take by mouth. Take 2 tablets by mouth twice daily.      Marland Kitchen omega-3 acid ethyl esters (LOVAZA) 1 G capsule Take 1 capsule by mouth four times daily.  120 capsule  4  . omeprazole (PRILOSEC) 40 MG capsule Take 1 capsule (40 mg  total) by mouth daily.  30 capsule  5   No current facility-administered medications on file prior to visit.    Review of Systems Patient denies any headache currently.  Headaches better.  Off fioricet.  No lightheadedness or dizziness.  No chest pain, tightness or palpitations.  No cardiac symptoms with increased activity or exertion.  No increased shortness of breath, cough or congestion.  No nausea or vomiting.  No acid reflux.  No abdominal pain or cramping.  No bowel change, such as diarrhea, constipation, BRBPR or melana.  Stress better.  Right elbow pain persist.  He is not wearing the strap.  Plans to restart.  Desires no further w/up at this point for the elbow or shoulder.  Some increased urination as outlined.         Objective:   Physical Exam  Filed Vitals:   06/18/13 0835  BP: 120/70  Pulse: 62  Temp: 98.3 F (36.8 C)   Blood pressure recheck:  49/23  58 year old male in no acute distress.  HEENT:  Nares - clear.  Oropharynx - without lesions. NECK:  Supple.  Nontender.  No audible carotid bruit.  HEART:  Appears to be regular.   LUNGS:  No crackles or wheezing audible.  Respirations even and unlabored.   RADIAL PULSE:  Equal bilaterally.  ABDOMEN:  Soft.  Nontender.  Bowel sounds present and normal.  No audible abdominal bruit.  GU:  Normal descended testicles.  No palpable testicular nodules.  Enlarged prostate.    RECTAL:  Could not appreciate any palpable prostate nodules.  Heme negative.   EXTREMITIES:  No increased edema present.  DP pulses palpable and equal bilaterally.          Assessment & Plan:  CARDIOVASCULAR.  Stress test negative 04/19/10.  Currently asymptomatic.   HEALTH MAINTENANCE.  Physical today.  Colonoscopy 10/02/08 with 4mm polyp in the rectum and 6mm polyp in the rectum and diverticulosis.  PSA 07/04/12 - 1.12.  Check PSA with next labs.    I spent 25 minutes with the patient and more than 50% of the time was spent in consultation regarding the  above.

## 2013-06-19 LAB — CULTURE, URINE COMPREHENSIVE
Colony Count: NO GROWTH
Organism ID, Bacteria: NO GROWTH

## 2013-06-22 ENCOUNTER — Encounter: Payer: Self-pay | Admitting: Internal Medicine

## 2013-06-22 NOTE — Assessment & Plan Note (Signed)
Stable.  Off fioricet and doing well.  Follow.   

## 2013-06-22 NOTE — Assessment & Plan Note (Signed)
Low cholesterol diet and exercise.  Triglycerides 477 (04/24/13).  Continue lipitor and lovaza.  Follow.  Low carb diet.

## 2013-06-22 NOTE — Assessment & Plan Note (Signed)
Have discussed need for smoking cessation.  Discussed with him again today.  He has cut down to 1/2 pack per day.  Plans to decrease more.  Follow.

## 2013-06-22 NOTE — Assessment & Plan Note (Signed)
Blood pressure doing better on losartan 100/25 q day.  Follow pressures closely.   Follow metabolic panel.    

## 2013-06-22 NOTE — Assessment & Plan Note (Signed)
Up to date with colonoscopy.   

## 2013-06-22 NOTE — Assessment & Plan Note (Signed)
Persistent pain.  Not using an elbow strap. Plans to restart.  Some right shoulder discomfort as well (at times).   Tylenol as directed.  Discussed possible injection.  He wants to hold on any further w/up or evaluation at this point.  Follow.

## 2013-06-22 NOTE — Assessment & Plan Note (Signed)
Check urinalysis and culture.  Prostate slightly enlarged.  Pending culture results, may treat for possible prostatitis and see if symptoms improve.

## 2013-07-01 ENCOUNTER — Other Ambulatory Visit: Payer: Self-pay | Admitting: *Deleted

## 2013-07-01 ENCOUNTER — Other Ambulatory Visit: Payer: Self-pay | Admitting: Internal Medicine

## 2013-07-01 DIAGNOSIS — R3911 Hesitancy of micturition: Secondary | ICD-10-CM

## 2013-07-01 MED ORDER — CIPROFLOXACIN HCL 500 MG PO TABS: 500.0000 mg | ORAL_TABLET | Freq: Two times a day (BID) | ORAL | Status: DC

## 2013-07-01 NOTE — Progress Notes (Signed)
Order placed for f/u met b.  

## 2013-07-15 ENCOUNTER — Other Ambulatory Visit: Payer: BC Managed Care – PPO

## 2013-08-02 ENCOUNTER — Other Ambulatory Visit (INDEPENDENT_AMBULATORY_CARE_PROVIDER_SITE_OTHER): Payer: BC Managed Care – PPO

## 2013-08-02 ENCOUNTER — Encounter (INDEPENDENT_AMBULATORY_CARE_PROVIDER_SITE_OTHER): Payer: Self-pay

## 2013-08-02 DIAGNOSIS — I10 Essential (primary) hypertension: Secondary | ICD-10-CM

## 2013-08-02 DIAGNOSIS — R3911 Hesitancy of micturition: Secondary | ICD-10-CM

## 2013-08-02 LAB — BASIC METABOLIC PANEL
BUN: 25 mg/dL — AB (ref 6–23)
CO2: 29 meq/L (ref 19–32)
Calcium: 9.4 mg/dL (ref 8.4–10.5)
Chloride: 102 mEq/L (ref 96–112)
Creatinine, Ser: 1.2 mg/dL (ref 0.4–1.5)
GFR: 64.04 mL/min (ref >60.00)
Glucose, Bld: 106 mg/dL — ABNORMAL HIGH (ref 70–99)
Potassium: 4.4 mEq/L (ref 3.5–5.1)
Sodium: 138 mEq/L (ref 135–145)

## 2013-08-05 ENCOUNTER — Encounter: Payer: Self-pay | Admitting: *Deleted

## 2013-10-14 ENCOUNTER — Other Ambulatory Visit: Payer: Self-pay | Admitting: *Deleted

## 2013-10-14 MED ORDER — ATENOLOL 50 MG PO TABS: 50.0000 mg | ORAL_TABLET | Freq: Every day | ORAL | Status: DC

## 2013-10-21 ENCOUNTER — Encounter: Payer: Self-pay | Admitting: Internal Medicine

## 2013-10-21 ENCOUNTER — Ambulatory Visit (INDEPENDENT_AMBULATORY_CARE_PROVIDER_SITE_OTHER): Payer: BC Managed Care – PPO | Admitting: Internal Medicine

## 2013-10-21 VITALS — BP 120/80 | HR 65 | Temp 98.4°F | Ht 68.5 in | Wt 177.5 lb

## 2013-10-21 DIAGNOSIS — F17200 Nicotine dependence, unspecified, uncomplicated: Secondary | ICD-10-CM

## 2013-10-21 DIAGNOSIS — Z720 Tobacco use: Secondary | ICD-10-CM

## 2013-10-21 DIAGNOSIS — M25519 Pain in unspecified shoulder: Secondary | ICD-10-CM

## 2013-10-21 DIAGNOSIS — M25529 Pain in unspecified elbow: Secondary | ICD-10-CM

## 2013-10-21 DIAGNOSIS — M25521 Pain in right elbow: Secondary | ICD-10-CM

## 2013-10-21 DIAGNOSIS — E7800 Pure hypercholesterolemia, unspecified: Secondary | ICD-10-CM

## 2013-10-21 DIAGNOSIS — I10 Essential (primary) hypertension: Secondary | ICD-10-CM

## 2013-10-21 DIAGNOSIS — M25511 Pain in right shoulder: Secondary | ICD-10-CM

## 2013-10-21 DIAGNOSIS — Z86010 Personal history of colonic polyps: Secondary | ICD-10-CM

## 2013-10-21 DIAGNOSIS — R51 Headache: Secondary | ICD-10-CM

## 2013-10-21 NOTE — Progress Notes (Signed)
Subjective:    Patient ID: Travis Gray, male    DOB: 1955/04/24, 58 y.o.   MRN: [id number]  HPI 58 year old male with past history of chronic headaches, cervical spinal stenosis, hypercholesterolemia and hypertension who comes in today for a scheduled follow up.  Headaches are not a significant issue now.  Off fioricet.  No chest pain or tightness.  No sob.  No acid reflux.  Eating and drinking well.  No bowel change.   Had surgery for his retina x 2 and cataract surgery.  S/p recent laser surgery.  Still seeing opthalmology.   Has noticed some floaters and plans to f/u with opthalmology regarding his eye issues.  Less stress.   Elbow pain has resolved.  Still with some right shoulder pain.  Flares intermittently.   Desires no further intervention at this time.  Also reports he is trying to cut back on his smoking.   Plans to decrease more.  Desires no further intervention at this time.      Past Medical History  Diagnosis Date  . Chronic headaches   . Degenerative disc disease, cervical   . Hypertension   . Hypercholesterolemia   . Tobacco abuse     Current Outpatient Prescriptions on File Prior to Visit  Medication Sig Dispense Refill  . amitriptyline (ELAVIL) 50 MG tablet Take 1 tablet (50 mg total) by mouth at bedtime.  30 tablet  5  . atenolol (TENORMIN) 50 MG tablet Take 1 tablet (50 mg total) by mouth daily.  30 tablet  5  . atorvastatin (LIPITOR) 10 MG tablet Take 1 tablet (10 mg total) by mouth daily.  30 tablet  5  . ciprofloxacin (CIPRO) 500 MG tablet Take 1 tablet (500 mg total) by mouth 2 (two) times daily.  20 tablet  0  . losartan-hydrochlorothiazide (HYZAAR) 100-25 MG per tablet Take 1 tablet by mouth daily.  30 tablet  5  . OMEGA 3-6-9 FATTY ACIDS PO Take by mouth. Take 2 tablets by mouth twice daily.      Marland Kitchen omega-3 acid ethyl esters (LOVAZA) 1 G capsule Take 1 capsule by mouth four times daily.  120 capsule  4  . omeprazole (PRILOSEC) 40 MG capsule Take 1 capsule (40 mg  total) by mouth daily.  30 capsule  5   No current facility-administered medications on file prior to visit.    Review of Systems Patient denies any headache currently.  Headaches better.  Off fioricet.  No lightheadedness or dizziness.  No chest pain, tightness or palpitations.  No cardiac symptoms with increased activity or exertion.  No increased shortness of breath, cough or congestion.  No nausea or vomiting.  No acid reflux.  No abdominal pain or cramping.  No bowel change, such as diarrhea, constipation, BRBPR or melana.  Stress better.  Right elbow pain resolved.  Some intermittent right shoulder pain.  Flares intermittently.  Desires no further intervention.         Objective:   Physical Exam  Filed Vitals:   10/21/13 0851  BP: 120/80  Pulse: 65  Temp: 98.4 F (36.9 C)   Blood pressure recheck:  6/87  58 year old male in no acute distress.  HEENT:  Nares - clear.  Oropharynx - without lesions. NECK:  Supple.  Nontender.  No audible carotid bruit.  HEART:  Appears to be regular.   LUNGS:  No crackles or wheezing audible.  Respirations even and unlabored.   RADIAL PULSE:  Equal bilaterally.  ABDOMEN:  Soft.  Nontender.  Bowel sounds present and normal.  No audible abdominal bruit. EXTREMITIES:  No increased edema present.  DP pulses palpable and equal bilaterally.          Assessment & Plan:  CARDIOVASCULAR.  Stress test negative 04/19/10.  Currently asymptomatic.   HEALTH MAINTENANCE.  Physical 04/18/13 .  Colonoscopy 10/02/08 with 60mm polyp in the rectum and 16mm polyp in the rectum and diverticulosis.  Contact GI when due next colonoscopy.  PSA 06/18/13 - 1.20.

## 2013-10-21 NOTE — Progress Notes (Signed)
Pre visit review using our clinic review tool, if applicable. No additional management support is needed unless otherwise documented below in the visit note. 

## 2013-10-22 ENCOUNTER — Encounter: Payer: Self-pay | Admitting: Internal Medicine

## 2013-10-22 NOTE — Assessment & Plan Note (Signed)
Stable.  Off fioricet and doing well.  Follow.

## 2013-10-22 NOTE — Assessment & Plan Note (Signed)
Blood pressure doing better on losartan 100/25 q day.  Follow pressures closely.   Follow metabolic panel.

## 2013-10-22 NOTE — Assessment & Plan Note (Signed)
Colonoscopy 2010.  Contact GI - when due f/u colonoscopy.

## 2013-10-22 NOTE — Assessment & Plan Note (Signed)
Some persistent intermittent right shoulder pain.  We discussed further evaluation and treatment today.  He declines.  Wants to follow.

## 2013-10-22 NOTE — Assessment & Plan Note (Signed)
Resolved

## 2013-10-22 NOTE — Assessment & Plan Note (Signed)
Low cholesterol diet and exercise.  Triglycerides have been increased.  Continue lipitor and lovaza.  Follow.  Low carb diet.

## 2013-10-22 NOTE — Assessment & Plan Note (Signed)
Have discussed need for smoking cessation.  Discussed with him again today.  He has cut down.   Plans to decrease more.  Follow.  Desires no further intervention at this time.  Follow.

## 2013-10-30 ENCOUNTER — Encounter: Payer: Self-pay | Admitting: Internal Medicine

## 2013-11-11 ENCOUNTER — Other Ambulatory Visit: Payer: Self-pay | Admitting: *Deleted

## 2013-11-11 DIAGNOSIS — Z85828 Personal history of other malignant neoplasm of skin: Secondary | ICD-10-CM | POA: Insufficient documentation

## 2013-11-11 MED ORDER — LOSARTAN POTASSIUM-HCTZ 100-25 MG PO TABS: 1.0000 | ORAL_TABLET | Freq: Every day | ORAL | Status: DC

## 2013-11-11 MED ORDER — OMEGA-3-ACID ETHYL ESTERS 1 G PO CAPS: ORAL_CAPSULE | ORAL | Status: DC

## 2013-11-11 MED ORDER — ATORVASTATIN CALCIUM 10 MG PO TABS: 10.0000 mg | ORAL_TABLET | Freq: Every day | ORAL | Status: DC

## 2013-11-19 ENCOUNTER — Other Ambulatory Visit (INDEPENDENT_AMBULATORY_CARE_PROVIDER_SITE_OTHER): Payer: BC Managed Care – PPO

## 2013-11-19 DIAGNOSIS — E7800 Pure hypercholesterolemia, unspecified: Secondary | ICD-10-CM

## 2013-11-19 DIAGNOSIS — I10 Essential (primary) hypertension: Secondary | ICD-10-CM

## 2013-11-19 LAB — CBC WITH DIFFERENTIAL/PLATELET
BASOS ABS: 0 10*3/uL (ref 0.0–0.1)
Basophils Relative: 0.5 % (ref 0.0–3.0)
Eosinophils Absolute: 0.1 10*3/uL (ref 0.0–0.7)
Eosinophils Relative: 2.2 % (ref 0.0–5.0)
HEMATOCRIT: 43.1 % (ref 39.0–52.0)
Hemoglobin: 14.6 g/dL (ref 13.0–17.0)
LYMPHS PCT: 45.7 % (ref 12.0–46.0)
Lymphs Abs: 2.7 10*3/uL (ref 0.7–4.0)
MCHC: 33.9 g/dL (ref 30.0–36.0)
MCV: 93.9 fl (ref 78.0–100.0)
Monocytes Absolute: 0.6 10*3/uL (ref 0.1–1.0)
Monocytes Relative: 10.4 % (ref 3.0–12.0)
NEUTROS PCT: 41.2 % — AB (ref 43.0–77.0)
Neutro Abs: 2.4 10*3/uL (ref 1.4–7.7)
PLATELETS: 208 10*3/uL (ref 150.0–400.0)
RBC: 4.59 Mil/uL (ref 4.22–5.81)
RDW: 12.9 % (ref 11.5–15.5)
WBC: 5.9 10*3/uL (ref 4.0–10.5)

## 2013-11-19 LAB — BASIC METABOLIC PANEL
BUN: 20 mg/dL (ref 6–23)
CHLORIDE: 100 meq/L (ref 96–112)
CO2: 27 mEq/L (ref 19–32)
Calcium: 9.4 mg/dL (ref 8.4–10.5)
Creatinine, Ser: 1 mg/dL (ref 0.4–1.5)
GFR: 79.25 mL/min (ref >60.00)
GLUCOSE: 93 mg/dL (ref 70–99)
Potassium: 4.5 mEq/L (ref 3.5–5.1)
Sodium: 138 mEq/L (ref 135–145)

## 2013-11-19 LAB — HEPATIC FUNCTION PANEL
ALT: 17 U/L (ref 0–53)
AST: 19 U/L (ref 0–37)
Albumin: 3.6 g/dL (ref 3.5–5.2)
Alkaline Phosphatase: 69 U/L (ref 39–117)
BILIRUBIN DIRECT: 0.1 mg/dL (ref 0.0–0.3)
BILIRUBIN TOTAL: 0.7 mg/dL (ref 0.2–1.2)
Total Protein: 6.9 g/dL (ref 6.0–8.3)

## 2013-11-19 LAB — LIPID PANEL
CHOLESTEROL: 217 mg/dL — AB (ref 0–200)
HDL: 24 mg/dL — ABNORMAL LOW (ref >39.00)
NonHDL: 193
TRIGLYCERIDES: 661 mg/dL — AB (ref 0.0–149.0)
Total CHOL/HDL Ratio: 9
VLDL: 132.2 mg/dL — AB (ref 0.0–40.0)

## 2013-11-19 LAB — LDL CHOLESTEROL, DIRECT: LDL DIRECT: 71 mg/dL

## 2013-11-19 LAB — TSH: TSH: 2.86 u[IU]/mL (ref 0.35–4.50)

## 2013-11-20 ENCOUNTER — Encounter: Payer: Self-pay | Admitting: *Deleted

## 2013-11-29 ENCOUNTER — Other Ambulatory Visit: Payer: Self-pay | Admitting: *Deleted

## 2013-11-29 MED ORDER — OMEPRAZOLE 40 MG PO CPDR: 40.0000 mg | DELAYED_RELEASE_CAPSULE | Freq: Every day | ORAL | Status: DC

## 2013-12-13 ENCOUNTER — Other Ambulatory Visit: Payer: Self-pay

## 2013-12-13 MED ORDER — AMITRIPTYLINE HCL 50 MG PO TABS
50.0000 mg | ORAL_TABLET | Freq: Every day | ORAL | Status: DC
Start: 2013-12-13 — End: 2014-05-15

## 2013-12-30 ENCOUNTER — Encounter: Payer: Self-pay | Admitting: Internal Medicine

## 2014-03-21 ENCOUNTER — Ambulatory Visit (INDEPENDENT_AMBULATORY_CARE_PROVIDER_SITE_OTHER): Payer: BLUE CROSS/BLUE SHIELD | Admitting: Internal Medicine

## 2014-03-21 ENCOUNTER — Encounter: Payer: Self-pay | Admitting: Internal Medicine

## 2014-03-21 VITALS — BP 120/80 | HR 65 | Temp 98.6°F | Ht 68.5 in | Wt 181.5 lb

## 2014-03-21 DIAGNOSIS — M545 Low back pain: Secondary | ICD-10-CM

## 2014-03-21 DIAGNOSIS — R252 Cramp and spasm: Secondary | ICD-10-CM

## 2014-03-21 DIAGNOSIS — E7800 Pure hypercholesterolemia, unspecified: Secondary | ICD-10-CM

## 2014-03-21 DIAGNOSIS — R202 Paresthesia of skin: Secondary | ICD-10-CM

## 2014-03-21 DIAGNOSIS — I10 Essential (primary) hypertension: Secondary | ICD-10-CM

## 2014-03-21 DIAGNOSIS — J329 Chronic sinusitis, unspecified: Secondary | ICD-10-CM

## 2014-03-21 DIAGNOSIS — Z720 Tobacco use: Secondary | ICD-10-CM

## 2014-03-21 MED ORDER — ALBUTEROL SULFATE HFA 108 (90 BASE) MCG/ACT IN AERS: 2.0000 | INHALATION_SPRAY | Freq: Four times a day (QID) | RESPIRATORY_TRACT | Status: DC | PRN

## 2014-03-21 MED ORDER — CEFUROXIME AXETIL 250 MG PO TABS: 250.0000 mg | ORAL_TABLET | Freq: Two times a day (BID) | ORAL | Status: DC

## 2014-03-21 NOTE — Progress Notes (Signed)
Pre visit review using our clinic review tool, if applicable. No additional management support is needed unless otherwise documented below in the visit note. 

## 2014-03-21 NOTE — Patient Instructions (Signed)
Saline nasal spray - flush nose at least 2-3x/day  nasacort nasal spray - 2 sprays each nostril one time per day  Continue mucinex in the morning and robitussin in the evening.

## 2014-03-21 NOTE — Progress Notes (Signed)
Patient ID: Travis Gray, male   DOB: 1955/02/09, 59 y.o.   MRN: [id number]   Subjective:    Patient ID: Travis Gray, male    DOB: 03/08/1955, 59 y.o.   MRN: [id number]  HPI  Patient here as a work in with concerns regarding head and chest congestion.  Started one week ago.  Has head and chest congestion.  Productive nasal congestion - colored.  Increased sinus pressure.  Increased cough - productive colored mucus.  Some wheezing.  No nausea or vomiting.  Some decreased appetite.  No diarrhea.  He does report increased numbness and tingling in middle three fingers on his right hand.  Will feel cold at times.  Some increased cramping - muscles of back, side and legs. Also reports some lower back and hip pain and discomfort.  Worse with walking.  Some foot tingling at times.  No weakness.     Past Medical History  Diagnosis Date  . Chronic headaches   . Degenerative disc disease, cervical   . Hypertension   . Hypercholesterolemia   . Tobacco abuse     Current Outpatient Prescriptions on File Prior to Visit  Medication Sig Dispense Refill  . amitriptyline (ELAVIL) 50 MG tablet Take 1 tablet (50 mg total) by mouth at bedtime. 30 tablet 3  . atenolol (TENORMIN) 50 MG tablet Take 1 tablet (50 mg total) by mouth daily. 30 tablet 5  . atorvastatin (LIPITOR) 10 MG tablet Take 1 tablet (10 mg total) by mouth daily. 30 tablet 5  . losartan-hydrochlorothiazide (HYZAAR) 100-25 MG per tablet Take 1 tablet by mouth daily. 30 tablet 5  . omega-3 acid ethyl esters (LOVAZA) 1 G capsule Take 1 capsule by mouth four times daily. 120 capsule 5  . omeprazole (PRILOSEC) 40 MG capsule Take 1 capsule (40 mg total) by mouth daily. 30 capsule 5   No current facility-administered medications on file prior to visit.    Review of Systems  Constitutional: Negative for appetite change and unexpected weight change.  HENT: Positive for congestion and sinus pressure.        Head and nasal congestion.  Colored  mucus.   Respiratory: Positive for cough and wheezing (some occasional wheezing). Negative for chest tightness and shortness of breath.   Cardiovascular: Negative for chest pain, palpitations and leg swelling.  Gastrointestinal: Negative for nausea, vomiting and diarrhea.  Musculoskeletal: Positive for back pain (lower back and hip/leg discomfort and cramps.  ).  Skin: Negative for rash.  Neurological: Negative for dizziness and light-headedness.       Objective:    Physical Exam  Constitutional: He appears well-developed and well-nourished. No distress.  HENT:  Right Ear: External ear normal.  Left Ear: External ear normal.  Mouth/Throat: Oropharynx is clear and moist. No oropharyngeal exudate.  Nares with slightly erythematous turbinates.    Neck: Neck supple.  Cardiovascular: Normal rate and regular rhythm.   Pulmonary/Chest: Effort normal and breath sounds normal. No respiratory distress.  No wheezing on exam.   Abdominal: Soft. Bowel sounds are normal. There is no tenderness.  Musculoskeletal: He exhibits no edema.  Minimal lower back discomfort with straight leg raise.  No weakness.    Lymphadenopathy:    He has no cervical adenopathy.  Psychiatric: He has a normal mood and affect. His behavior is normal.    BP 120/80 mmHg  Pulse 65  Temp(Src) 98.6 F (37 C) (Oral)  Ht 5' 8.5" (1.74 m)  Wt 181 lb 8 oz (82.328  kg)  BMI 27.19 kg/m2  SpO2 95% Wt Readings from Last 3 Encounters:  03/21/14 181 lb 8 oz (82.328 kg)  10/21/13 177 lb 8 oz (80.513 kg)  06/18/13 171 lb 4 oz (77.678 kg)     Lab Results  Component Value Date   WBC 5.9 11/19/2013   HGB 14.6 11/19/2013   HCT 43.1 11/19/2013   PLT 208.0 11/19/2013   GLUCOSE 93 11/19/2013   CHOL 217* 11/19/2013   TRIG 661.0* 11/19/2013   HDL 24.00* 11/19/2013   LDLDIRECT 71.0 11/19/2013   LDLCALC 60 04/24/2013   ALT 17 11/19/2013   AST 19 11/19/2013   NA 138 11/19/2013   K 4.5 11/19/2013   CL 100 11/19/2013    CREATININE 1.0 11/19/2013   BUN 20 11/19/2013   CO2 27 11/19/2013   TSH 2.86 11/19/2013   PSA 1.20 06/18/2013   HGBA1C 5.6 06/18/2013       Assessment & Plan:   Problem List Items Addressed This Visit    Back pain    Some lower back, hip and leg pain and cramping.  Stay hydrated.  Stretches.  Check labs as outlined.  Discussed with him regarding further w/up.  He wants to monitor fo rnow.  Follow.        Essential hypertension, benign    Blood pressure has been doing well.  Follow.  Same medication regimen.       Relevant Orders   Basic metabolic panel   Muscle cramps    Stay hydrated.  Check potassium, magnesium, calcium.  Stretches.  Follow.        Numbness and tingling in hands    Some minimal tingling - phalen's - on exam.  Consider wrist splint.  No neck issues.  Discussed further w/up.  He declines now.  Wants to follow.  Check labs.         Relevant Orders   Vitamin B12   Sinusitis - Primary    Sinusitis/uri with increased cough and congestion.  Treat with ceftin as directed.  Mucinex in the am and robitussin in the pm.  Saline nasal spray and nasacort as directed.  albuteraol inhaler as directed.  Rest.  Fluids.  Explained to him if symptoms changed, worsened or did not resolve - is to be reevaluated.        Relevant Medications   cefUROXime (CEFTIN) tablet   Tobacco abuse    Discussed the smoking and the need to quit.  He is not ready to quit at this time.  Follow.         Other Visit Diagnoses    Hypercholesterolemia        Relevant Orders    Lipid panel    Hepatic function panel      I spent 25 minutes with the patient and more than 50% of the time was spent in consultation regarding the above.     [name], MD

## 2014-03-23 ENCOUNTER — Encounter: Payer: Self-pay | Admitting: Internal Medicine

## 2014-03-23 DIAGNOSIS — J329 Chronic sinusitis, unspecified: Secondary | ICD-10-CM | POA: Insufficient documentation

## 2014-03-23 DIAGNOSIS — R200 Anesthesia of skin: Secondary | ICD-10-CM | POA: Insufficient documentation

## 2014-03-23 DIAGNOSIS — R252 Cramp and spasm: Secondary | ICD-10-CM | POA: Insufficient documentation

## 2014-03-23 DIAGNOSIS — R202 Paresthesia of skin: Secondary | ICD-10-CM

## 2014-03-23 DIAGNOSIS — M549 Dorsalgia, unspecified: Secondary | ICD-10-CM | POA: Insufficient documentation

## 2014-03-23 NOTE — Assessment & Plan Note (Signed)
Sinusitis/uri with increased cough and congestion.  Treat with ceftin as directed.  Mucinex in the am and robitussin in the pm.  Saline nasal spray and nasacort as directed.  albuteraol inhaler as directed.  Rest.  Fluids.  Explained to him if symptoms changed, worsened or did not resolve - is to be reevaluated.

## 2014-03-23 NOTE — Assessment & Plan Note (Signed)
Blood pressure has been doing well.  Follow.  Same medication regimen.   

## 2014-03-23 NOTE — Assessment & Plan Note (Signed)
Some minimal tingling - phalen's - on exam.  Consider wrist splint.  No neck issues.  Discussed further w/up.  He declines now.  Wants to follow.  Check labs.

## 2014-03-23 NOTE — Assessment & Plan Note (Signed)
Discussed the smoking and the need to quit.  He is not ready to quit at this time.  Follow.

## 2014-03-23 NOTE — Assessment & Plan Note (Signed)
Stay hydrated.  Check potassium, magnesium, calcium.  Stretches.  Follow.

## 2014-03-23 NOTE — Assessment & Plan Note (Signed)
Some lower back, hip and leg pain and cramping.  Stay hydrated.  Stretches.  Check labs as outlined.  Discussed with him regarding further w/up.  He wants to monitor fo rnow.  Follow.

## 2014-03-27 ENCOUNTER — Other Ambulatory Visit (INDEPENDENT_AMBULATORY_CARE_PROVIDER_SITE_OTHER): Payer: BLUE CROSS/BLUE SHIELD

## 2014-03-27 DIAGNOSIS — I10 Essential (primary) hypertension: Secondary | ICD-10-CM

## 2014-03-27 DIAGNOSIS — E7800 Pure hypercholesterolemia, unspecified: Secondary | ICD-10-CM

## 2014-03-27 DIAGNOSIS — R202 Paresthesia of skin: Secondary | ICD-10-CM

## 2014-03-27 LAB — BASIC METABOLIC PANEL
BUN: 21 mg/dL (ref 6–23)
CHLORIDE: 100 meq/L (ref 96–112)
CO2: 32 meq/L (ref 19–32)
CREATININE: 1.21 mg/dL (ref 0.40–1.50)
Calcium: 9.4 mg/dL (ref 8.4–10.5)
GFR: 65.72 mL/min (ref >60.00)
Glucose, Bld: 102 mg/dL — ABNORMAL HIGH (ref 70–99)
Potassium: 4.2 mEq/L (ref 3.5–5.1)
Sodium: 137 mEq/L (ref 135–145)

## 2014-03-27 LAB — HEPATIC FUNCTION PANEL
ALT: 17 U/L (ref 0–53)
AST: 16 U/L (ref 0–37)
Albumin: 4.2 g/dL (ref 3.5–5.2)
Alkaline Phosphatase: 71 U/L (ref 39–117)
BILIRUBIN TOTAL: 0.4 mg/dL (ref 0.2–1.2)
Bilirubin, Direct: 0 mg/dL (ref 0.0–0.3)
Total Protein: 7 g/dL (ref 6.0–8.3)

## 2014-03-27 LAB — LIPID PANEL
Cholesterol: 191 mg/dL (ref 0–200)
HDL: 24.3 mg/dL — ABNORMAL LOW (ref >39.00)
Total CHOL/HDL Ratio: 8
Triglycerides: 892 mg/dL — ABNORMAL HIGH (ref 0.0–149.0)

## 2014-03-27 LAB — VITAMIN B12: Vitamin B-12: 389 pg/mL (ref 211–911)

## 2014-03-27 LAB — LDL CHOLESTEROL, DIRECT: Direct LDL: 48 mg/dL

## 2014-03-31 ENCOUNTER — Encounter: Payer: Self-pay | Admitting: *Deleted

## 2014-04-24 ENCOUNTER — Encounter: Payer: Self-pay | Admitting: Internal Medicine

## 2014-04-24 ENCOUNTER — Ambulatory Visit (INDEPENDENT_AMBULATORY_CARE_PROVIDER_SITE_OTHER): Payer: BLUE CROSS/BLUE SHIELD | Admitting: Internal Medicine

## 2014-04-24 VITALS — BP 115/77 | HR 62 | Temp 98.0°F | Ht 68.0 in | Wt 182.4 lb

## 2014-04-24 DIAGNOSIS — Z0000 Encounter for general adult medical examination without abnormal findings: Secondary | ICD-10-CM

## 2014-04-24 DIAGNOSIS — R51 Headache: Secondary | ICD-10-CM

## 2014-04-24 DIAGNOSIS — R079 Chest pain, unspecified: Secondary | ICD-10-CM

## 2014-04-24 DIAGNOSIS — Z720 Tobacco use: Secondary | ICD-10-CM

## 2014-04-24 DIAGNOSIS — I10 Essential (primary) hypertension: Secondary | ICD-10-CM

## 2014-04-24 DIAGNOSIS — R519 Headache, unspecified: Secondary | ICD-10-CM

## 2014-04-24 DIAGNOSIS — E7800 Pure hypercholesterolemia, unspecified: Secondary | ICD-10-CM

## 2014-04-24 DIAGNOSIS — M25551 Pain in right hip: Secondary | ICD-10-CM

## 2014-04-24 DIAGNOSIS — Z86010 Personal history of colonic polyps: Secondary | ICD-10-CM

## 2014-04-24 NOTE — Progress Notes (Signed)
Patient ID: [name], male   DOB: 08-04-1955, 59 y.o.   MRN: [id number]   Subjective:    Patient ID: Travis Gray, male    DOB: 02-03-1955, 59 y.o.   MRN: [id number]  HPI  Patient here for his physical exam.  We discussed his recent elevated triglycerides.  Discussed diet and exercise.  He is back taking his lovaza as scheduled.  Reports some left and right side chest pain after lifting a heavy cooler.  Pain was worse with position changes and movements.  No cardiac symptoms with increased activity or exertion.  No sob.  Breathing stable.  Pain has resolved now.  No acid reflux.  Bowels stable.  No problems with headaches now.  Right hip pain better since moving his wallet.     Past Medical History  Diagnosis Date  . Chronic headaches   . Degenerative disc disease, cervical   . Hypertension   . Hypercholesterolemia   . Tobacco abuse     Current Outpatient Prescriptions on File Prior to Visit  Medication Sig Dispense Refill  . albuterol (PROVENTIL HFA;VENTOLIN HFA) 108 (90 BASE) MCG/ACT inhaler Inhale 2 puffs into the lungs every 6 (six) hours as needed for wheezing or shortness of breath. 1 Inhaler 0  . amitriptyline (ELAVIL) 50 MG tablet Take 1 tablet (50 mg total) by mouth at bedtime. 30 tablet 3  . atenolol (TENORMIN) 50 MG tablet Take 1 tablet (50 mg total) by mouth daily. 30 tablet 5  . atorvastatin (LIPITOR) 10 MG tablet Take 1 tablet (10 mg total) by mouth daily. 30 tablet 5  . cefUROXime (CEFTIN) 250 MG tablet Take 1 tablet (250 mg total) by mouth 2 (two) times daily with a meal. 20 tablet 0  . losartan-hydrochlorothiazide (HYZAAR) 100-25 MG per tablet Take 1 tablet by mouth daily. 30 tablet 5  . omega-3 acid ethyl esters (LOVAZA) 1 G capsule Take 1 capsule by mouth four times daily. 120 capsule 5  . omeprazole (PRILOSEC) 40 MG capsule Take 1 capsule (40 mg total) by mouth daily. 30 capsule 5   No current facility-administered medications on file prior to visit.     Review of Systems  Constitutional: Negative for appetite change and unexpected weight change.  HENT: Negative for congestion and sinus pressure.   Eyes: Negative for pain and visual disturbance.  Respiratory: Negative for cough, chest tightness and shortness of breath.   Cardiovascular: Positive for chest pain (resolved now. ). Negative for palpitations and leg swelling.  Gastrointestinal: Negative for nausea, vomiting, abdominal pain and diarrhea.  Genitourinary: Negative for dysuria and difficulty urinating.  Musculoskeletal: Negative for back pain and joint swelling.  Skin: Negative for color change and rash.  Neurological: Negative for dizziness, light-headedness and headaches.  Hematological: Negative for adenopathy. Does not bruise/bleed easily.  Psychiatric/Behavioral: Negative for dysphoric mood and agitation.       Objective:     Blood pressure recheck:  118/78  Physical Exam  Constitutional: He is oriented to person, place, and time. He appears well-developed and well-nourished. No distress.  HENT:  Head: Normocephalic and atraumatic.  Nose: Nose normal.  Mouth/Throat: Oropharynx is clear and moist. No oropharyngeal exudate.  Slightly erythematous turbinates.   Eyes: Conjunctivae are normal. Right eye exhibits no discharge. Left eye exhibits no discharge.  Neck: Neck supple. No thyromegaly present.  Cardiovascular: Normal rate and regular rhythm.   Pulmonary/Chest: Breath sounds normal. No respiratory distress. He has no wheezes.  Abdominal: Soft. Bowel sounds are normal.  There is no tenderness.  Genitourinary: Rectum normal and penis normal.  Normal descended testicles.  No nodules.  Rectal exam - no palpable prostate nodules.    Musculoskeletal: He exhibits no edema or tenderness.  Lymphadenopathy:    He has no cervical adenopathy.  Neurological: He is alert and oriented to person, place, and time.  Skin: Skin is warm and dry. No rash noted.  Psychiatric: He  has a normal mood and affect. His behavior is normal.    BP 115/77 mmHg  Pulse 62  Temp(Src) 98 F (36.7 C) (Oral)  Ht 5' 8" (1.727 m)  Wt 182 lb 6 oz (82.725 kg)  BMI 27.74 kg/m2  SpO2 98% Wt Readings from Last 3 Encounters:  04/24/14 182 lb 6 oz (82.725 kg)  03/21/14 181 lb 8 oz (82.328 kg)  10/21/13 177 lb 8 oz (80.513 kg)     Lab Results  Component Value Date   WBC 5.9 11/19/2013   HGB 14.6 11/19/2013   HCT 43.1 11/19/2013   PLT 208.0 11/19/2013   GLUCOSE 102* 03/27/2014   CHOL 191 03/27/2014   TRIG * 03/27/2014    892.0 Triglyceride is over 400; calculations on Lipids are invalid.   HDL 24.30* 03/27/2014   LDLDIRECT 48.0 03/27/2014   LDLCALC 60 04/24/2013   ALT 17 03/27/2014   AST 16 03/27/2014   NA 137 03/27/2014   K 4.2 03/27/2014   CL 100 03/27/2014   CREATININE 1.21 03/27/2014   BUN 21 03/27/2014   CO2 32 03/27/2014   TSH 2.86 11/19/2013   PSA 1.20 06/18/2013   HGBA1C 5.6 06/18/2013       Assessment & Plan:   Problem List Items Addressed This Visit    Chest pain    Left and right side chest pain.  Occurred after heavy lifting.  Resolved now.  Pain was aggravated by movement and position changes.  No chest pain or sob with exertion or activity.  Follow.        Essential hypertension, benign - Primary    Blood pressure doing well.  Same medication regimen.  Follow pressures.  Follow met b.        Relevant Orders   Basic metabolic panel   Headache    Not a significant issue for him now.  Follow.       Health care maintenance    Physical today.  Colonoscopy 10/02/08.  Will need to contact GI when due f/u colonoscopy.  PSA 06/18/13 - 1.20.        History of colonic polyps    Colonoscopy 10/02/08.  Spoke to Dr [employer] office.  Due f/u colonoscopy 09/2017.        Pure hypercholesterolemia    Triglycerides are elevated.  Discussed with him today.  He is back to taking the lovaza as ordered.  Low cholesterol diet and exercise.  Follow lipid panel.         Relevant Orders   Hepatic function panel   Lipid panel   Right hip pain    Better since he moved his wallet out of his right back pocket.  Follow.        Tobacco abuse    Discussed smoking cessation.  Follow.          I spent 25 minutes with the patient and more than 50% of the time was spent in consultation regarding the above.     [name], MD

## 2014-04-24 NOTE — Progress Notes (Signed)
Pre visit review using our clinic review tool, if applicable. No additional management support is needed unless otherwise documented below in the visit note. 

## 2014-04-27 ENCOUNTER — Telehealth: Payer: Self-pay | Admitting: Internal Medicine

## 2014-04-27 ENCOUNTER — Encounter: Payer: Self-pay | Admitting: Internal Medicine

## 2014-04-27 DIAGNOSIS — M25551 Pain in right hip: Secondary | ICD-10-CM | POA: Insufficient documentation

## 2014-04-27 DIAGNOSIS — Z0000 Encounter for general adult medical examination without abnormal findings: Secondary | ICD-10-CM | POA: Insufficient documentation

## 2014-04-27 DIAGNOSIS — R079 Chest pain, unspecified: Secondary | ICD-10-CM | POA: Insufficient documentation

## 2014-04-27 NOTE — Assessment & Plan Note (Signed)
Triglycerides are elevated.  Discussed with him today.  He is back to taking the lovaza as ordered.  Low cholesterol diet and exercise.  Follow lipid panel.

## 2014-04-27 NOTE — Assessment & Plan Note (Signed)
Left and right side chest pain.  Occurred after heavy lifting.  Resolved now.  Pain was aggravated by movement and position changes.  No chest pain or sob with exertion or activity.  Follow.

## 2014-04-27 NOTE — Assessment & Plan Note (Signed)
Not a significant issue for him now.  Follow.

## 2014-04-27 NOTE — Assessment & Plan Note (Signed)
Colonoscopy 10/02/08.  Spoke to Dr Fiserv office.  Due f/u colonoscopy 09/2017.

## 2014-04-27 NOTE — Assessment & Plan Note (Signed)
Physical today.  Colonoscopy 10/02/08.  Will need to contact GI when due f/u colonoscopy.  PSA 06/18/13 - 1.20.

## 2014-04-27 NOTE — Telephone Encounter (Signed)
Needs a f/u appt scheduled in 4 months (44min)

## 2014-04-27 NOTE — Assessment & Plan Note (Signed)
Discussed smoking cessation.  Follow.

## 2014-04-27 NOTE — Assessment & Plan Note (Signed)
Better since he moved his wallet out of his right back pocket.  Follow.

## 2014-04-27 NOTE — Assessment & Plan Note (Signed)
Blood pressure doing well.  Same medication regimen.  Follow pressures.  Follow met b.   

## 2014-05-15 ENCOUNTER — Other Ambulatory Visit: Payer: Self-pay | Admitting: *Deleted

## 2014-05-15 MED ORDER — LOSARTAN POTASSIUM-HCTZ 100-25 MG PO TABS: 1.0000 | ORAL_TABLET | Freq: Every day | ORAL | Status: DC

## 2014-05-15 MED ORDER — AMITRIPTYLINE HCL 50 MG PO TABS: 50.0000 mg | ORAL_TABLET | Freq: Every day | ORAL | Status: DC

## 2014-05-15 MED ORDER — ATENOLOL 50 MG PO TABS: 50.0000 mg | ORAL_TABLET | Freq: Every day | ORAL | Status: DC

## 2014-05-16 NOTE — Op Note (Signed)
PATIENT NAME:  Travis Gray, Travis Gray MR#:  [id number] DATE OF BIRTH:  08/12/1955  DATE OF PROCEDURE:  03/23/2012  PROCEDURE PERFORMED:  1. Pars plana vitrectomy of the left eye.  2. Scleral buckle of the left eye.  3. Gas exchange of nasal left eye.  4. Endolaser of the left eye.  5. Retinal detachment repair of the left eye.   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   ESTIMATED BLOOD LOSS:  Less than 1 mL.  PRIMARY SURGEON:  Coralee Rud, [name]   ANESTHESIA:  General endotracheal anesthesia with supplemental retrobulbar block of the left eye.   COMPLICATIONS:  None.   INDICATIONS FOR PROCEDURE:  This is a patient who presented to my office who noted several floaters beginning multiple weeks ago. The patient stated that he lost central vision approximately 2 days prior. Examination revealed a macula-off rhegmatogenous retinal detachment with a large retinal tear at 6 o'clock. The retinal detachment was noted to be approximately 80% off. The risks, benefits and alternatives of the above procedure were discussed and the patient wished to proceed.  DETAILS OF PROCEDURE:  After informed consent was obtained, the patient was brought to the operative suite at [facility]. The patient was placed in supine position, was induced by the Anesthesia team without complication and a retrobulbar block was performed on the left eye by the primary surgeon without complication. The left eye was prepped and draped in sterile manner. After a lid speculum was inserted, a 360-degree conjunctival peritomy was performed. The Tenon's capsule was incised off of the globe in all 4 quadrants. Each of the 4 rectus muscles were isolated using 4-0 silks. Sutures of 5-0 nylon sutures were then placed in a horizontal mattress fashion in each of the 4 quadrants. This  was done with the anterior portion of the horizontal mattress suture 4 mm beyond the rectus muscle insertion and then the posterior portion being 4 mm beyond that. A 42 encircling band was placed circumferentially around the globe through each of the horizontal mattress sutures and underneath each of the rectus muscles. Extreme care was taken to ensure that each of the oblique muscles were not involved. The band was then brought together with a 70 sleeve in the inferotemporal quadrant. The ends were pulled up and each of the horizontal mattress sutures were tied. The ends were trimmed and attention was turned to the pars plana vitrectomy portion of the case.   A 23-gauge trocar was placed inferotemporally in an oblique fashion 4 mm beyond the limbus. The vitreous cutter was inserted through this and was used to decompressed the eye in order to reperfuse the retina. The infusion cannula was turned on and inserted through the trocar and secured in position with Steri-Strips. Two more trocars were placed in a similar fashion superotemporally and superonasally. Vitreous cutter and light pipe were introduced in the eye and a core vitrectomy was performed. Peripheral vitreous was trimmed for 360 degrees. No other tears could be identified for 360 degrees except for the initial tear at 6 o'clock. The tear was noted to be perfectly placed on top of the buckle. Once the vitrectomy was completed, it was noted that there was a small retinal fold on the superior aspect of the fovea. The retinal detachment was carefully extended slightly in order to remove this potential fold from the foveal area. Perfluoron was then  injected to flatten the retina and the fold was noted to be completely gone. The Perfluoron was injected up to the most posterior aspect of the retinal tear. The soft tip was introduced and an air-fluid exchange was performed at the level of the Perfluoron. Endolaser was introduced and 4 rows of laser was placed  around the tear. Extreme care was taken to remove all of the Perfluoron. Endolaser was completed around the original tear and then the laser was carried along the buckle to create 3 rows of laser on the buckle circumferentially. It should be noted that there was a small area of proliferative vitreal retinopathy that was identified at 3 o'clock just prior to the Perfluoron insertion. This was peeled away using forceps and it created a very small hole. After the Perfluoron had been removed and laser was completed around the buckle, any remnant fluid was removed from the posterior and removed through that hole. Four rows of laser was placed around the small hole at 3 o'clock. The retina was reinvestigated and it was noted to be completely flat. One trocar was then removed and closed with 7-0 Vicryl. Then 14% C3F8 was used as an Acupuncturist. The trocars were removed and closed using 7-0 Vicryl. The eye was pressurized to 15 mmHg with the C3F8. The buckle was investigated and the ends were noted to be trimmed. The buckle was then rinsed using gentamicin. Supplemental retrobulbar block was then given. The bridle sutures were removed and the conjunctiva was pulled up. The conjunctiva was closed using running 6-0 plain gut. Next 5 mg of dexamethasone was given subTenon's. The eye was cleaned. Cosopt and TobraDex was placed on the eye and a patch and shield were placed over the eye. The patient was reversed from anesthesia and taken to the Postanesthesia Care with instruction to remain face down.   ____________________________ Teresa Pelton. Starling Manns, MD mfa:jm D: 03/23/2012 09:26:45 ET T: 03/23/2012 10:02:24 ET JOB#: [id number]  cc: Teresa Pelton. Starling Manns, MD, <Dictator> Coralee Rud MD ELECTRONICALLY SIGNED 03/25/2012 15:26

## 2014-05-16 NOTE — Op Note (Signed)
PATIENT NAME:  Travis Gray, Travis Gray MR#:  [id number] DATE OF BIRTH:  02/10/1955  DATE OF PROCEDURE:  06/27/2012  PROCEDURES PERFORMED:  1. Pars plana vitrectomy of the right eye.  2. Gas exchange of the right eye.  3. Endolaser of the right eye.   PREOPERATIVE DIAGNOSIS: Rhegmatogenous retinal detachment.   POSTOPERATIVE DIAGNOSIS: Rhegmatogenous retinal detachment.   ESTIMATED BLOOD LOSS: Less than 1 mL.   PRIMARY SURGEON: Teresa Pelton. Starling Manns, [name]   ANESTHESIA: A retrobulbar block of the right eye with monitored anesthesia care.   COMPLICATIONS: None.   INDICATIONS FOR PROCEDURE: This is a patient who presented to my office after cataract surgery with hand motion vision. Examination revealed a macula off rhegmatogenous retinal detachment of the right eye. Risks, benefits and alternatives of the above procedure were discussed, and the patient wished to proceed.   DETAILS: After informed consent was obtained, the patient was brought into the operative suite at [facility]. The patient was placed in supine position, given a small dose of propofol and a retrobulbar block was performed on the right eye by the primary surgeon without any complications. The right eye was prepped and draped in sterile manner. After lid speculum was inserted, a 25-gauge trocar was placed inferotemporally through displaced conjunctiva 3 mm beyond the limbus. The infusion cannula was turned on and inserted through the trocar and secured in position with Steri-Strips. Two more trocars were placed in a similar fashion superotemporally and superonasally. Vitreous cutter and light pipe were introduced into the eye and peripheral vitreous was trimmed. The retina was carefully investigated, and a small retinal tear was identified just anterior to the original area of laser with a tract through the sparing of the long ciliary nerve causing full retinal detachment. EndoCautery was introduced, and a posterior  draining retinotomy was created at approximately 11:30 posterior to the buckle. Another small tear was noted at approximately 9 o'clock posterior to the buckle. Each of the tears were marked using the [facility]. An air-fluid exchange was performed through the posterior draining retinotomy. The retina was completely flattened. Four rows of laser were placed around each of the tears. Laser was carried from the buckle out to the ora serrata for 360 degrees. Laser was also carried posterior to the buckle for 360 degrees with no gaps. This extended the laser to the arcades for 360 degrees. Each of the tears were noted to be completely surrounded. Remnant fluid was removed. Then, 14% C3F8 was used as an Acupuncturist. The trocars were removed, and the wounds were noted to be airtight. Dexamethasone 5 mg was given into the inferior fornix. The lid speculum was removed, and the eye was cleaned. TobraDex was placed in the eye, and a patch and shield was placed over the eye. The patient was taken to postanesthesia care with instructions to remain face down.    ____________________________ Teresa Pelton. Starling Manns, MD mfa:gb D: 06/27/2012 21:34:55 ET T: 06/27/2012 21:54:42 ET JOB#: [id number]  cc: Teresa Pelton. Starling Manns, MD, <Dictator> Coralee Rud MD ELECTRONICALLY SIGNED 08/01/2012 7:17

## 2014-05-21 ENCOUNTER — Telehealth: Payer: Self-pay | Admitting: *Deleted

## 2014-05-21 DIAGNOSIS — M79606 Pain in leg, unspecified: Secondary | ICD-10-CM

## 2014-05-21 NOTE — Telephone Encounter (Signed)
I contacted patient today based on the mychart message that his wife sent through her chart stating that Travis Gray lower back, hip, & foot pain is no better after he tried removing his wallet to see if that would help. Pt states that he is definitely willing to see ortho. Would like a referral soon.  Thanks

## 2014-05-21 NOTE — Telephone Encounter (Signed)
Order placed for referral to ortho.

## 2014-06-05 ENCOUNTER — Other Ambulatory Visit: Payer: Self-pay | Admitting: *Deleted

## 2014-06-05 MED ORDER — OMEPRAZOLE 40 MG PO CPDR: 40.0000 mg | DELAYED_RELEASE_CAPSULE | Freq: Every day | ORAL | Status: DC

## 2014-06-16 ENCOUNTER — Other Ambulatory Visit: Payer: Self-pay | Admitting: *Deleted

## 2014-06-16 MED ORDER — ATORVASTATIN CALCIUM 10 MG PO TABS: 10.0000 mg | ORAL_TABLET | Freq: Every day | ORAL | Status: DC

## 2014-07-23 ENCOUNTER — Other Ambulatory Visit: Payer: Self-pay | Admitting: *Deleted

## 2014-07-23 MED ORDER — AMITRIPTYLINE HCL 50 MG PO TABS: 50.0000 mg | ORAL_TABLET | Freq: Every day | ORAL | Status: DC

## 2014-07-24 ENCOUNTER — Other Ambulatory Visit (INDEPENDENT_AMBULATORY_CARE_PROVIDER_SITE_OTHER): Payer: BLUE CROSS/BLUE SHIELD

## 2014-07-24 DIAGNOSIS — E7800 Pure hypercholesterolemia, unspecified: Secondary | ICD-10-CM

## 2014-07-24 DIAGNOSIS — I10 Essential (primary) hypertension: Secondary | ICD-10-CM | POA: Diagnosis not present

## 2014-07-24 LAB — LDL CHOLESTEROL, DIRECT: Direct LDL: 52 mg/dL

## 2014-07-24 LAB — HEPATIC FUNCTION PANEL
ALT: 17 U/L (ref 0–53)
AST: 19 U/L (ref 0–37)
Albumin: 4.1 g/dL (ref 3.5–5.2)
Alkaline Phosphatase: 65 U/L (ref 39–117)
BILIRUBIN DIRECT: 0.1 mg/dL (ref 0.0–0.3)
Total Bilirubin: 0.4 mg/dL (ref 0.2–1.2)
Total Protein: 6.9 g/dL (ref 6.0–8.3)

## 2014-07-24 LAB — BASIC METABOLIC PANEL
BUN: 18 mg/dL (ref 6–23)
CHLORIDE: 102 meq/L (ref 96–112)
CO2: 34 mEq/L — ABNORMAL HIGH (ref 19–32)
Calcium: 9.2 mg/dL (ref 8.4–10.5)
Creatinine, Ser: 1.07 mg/dL (ref 0.40–1.50)
GFR: 75.66 mL/min (ref >60.00)
GLUCOSE: 106 mg/dL — AB (ref 70–99)
POTASSIUM: 4.5 meq/L (ref 3.5–5.1)
Sodium: 140 mEq/L (ref 135–145)

## 2014-07-24 LAB — LIPID PANEL
Cholesterol: 184 mg/dL (ref 0–200)
HDL: 26 mg/dL — ABNORMAL LOW (ref >39.00)
Total CHOL/HDL Ratio: 7

## 2014-07-25 ENCOUNTER — Encounter: Payer: Self-pay | Admitting: *Deleted

## 2014-08-28 ENCOUNTER — Ambulatory Visit (INDEPENDENT_AMBULATORY_CARE_PROVIDER_SITE_OTHER): Payer: BLUE CROSS/BLUE SHIELD | Admitting: Internal Medicine

## 2014-08-28 ENCOUNTER — Encounter (INDEPENDENT_AMBULATORY_CARE_PROVIDER_SITE_OTHER): Payer: Self-pay

## 2014-08-28 ENCOUNTER — Encounter: Payer: Self-pay | Admitting: Internal Medicine

## 2014-08-28 VITALS — BP 130/84 | HR 65 | Temp 98.0°F | Ht 68.0 in | Wt 179.5 lb

## 2014-08-28 DIAGNOSIS — Z125 Encounter for screening for malignant neoplasm of prostate: Secondary | ICD-10-CM

## 2014-08-28 DIAGNOSIS — R519 Headache, unspecified: Secondary | ICD-10-CM

## 2014-08-28 DIAGNOSIS — Z720 Tobacco use: Secondary | ICD-10-CM

## 2014-08-28 DIAGNOSIS — R51 Headache: Secondary | ICD-10-CM

## 2014-08-28 DIAGNOSIS — I10 Essential (primary) hypertension: Secondary | ICD-10-CM | POA: Diagnosis not present

## 2014-08-28 DIAGNOSIS — Z0000 Encounter for general adult medical examination without abnormal findings: Secondary | ICD-10-CM

## 2014-08-28 DIAGNOSIS — L989 Disorder of the skin and subcutaneous tissue, unspecified: Secondary | ICD-10-CM | POA: Diagnosis not present

## 2014-08-28 DIAGNOSIS — E7800 Pure hypercholesterolemia, unspecified: Secondary | ICD-10-CM

## 2014-08-28 DIAGNOSIS — Z86010 Personal history of colonic polyps: Secondary | ICD-10-CM

## 2014-08-28 DIAGNOSIS — M545 Low back pain: Secondary | ICD-10-CM

## 2014-08-28 NOTE — Progress Notes (Signed)
Patient ID: Travis Gray, male   DOB: May 20, 1955, 59 y.o.   MRN: [id number]   Subjective:    Patient ID: Travis Gray, male    DOB: 1955/08/23, 59 y.o.   MRN: [id number]  HPI  Patient here for a scheduled follow up.  Is seeing ortho for his back and leg.  Going to physical therapy now.  Planning to start core exercises.  Still with some discomfort.  Stays active.  No cardiac symptoms with increased activity or exertion.  No sob.  No acid reflux.  Discussed diet and exercise.  Discussed triglycerides.  Persistent face and jaw lesion.  Needs f/u with his dermatologist.     Past Medical History  Diagnosis Date  . Chronic headaches   . Degenerative disc disease, cervical   . Hypertension   . Hypercholesterolemia   . Tobacco abuse     Outpatient Encounter Prescriptions as of 08/28/2014  Medication Sig  . albuterol (PROVENTIL HFA;VENTOLIN HFA) 108 (90 BASE) MCG/ACT inhaler Inhale 2 puffs into the lungs every 6 (six) hours as needed for wheezing or shortness of breath.  Marland Kitchen amitriptyline (ELAVIL) 50 MG tablet Take 1 tablet (50 mg total) by mouth at bedtime.  Marland Kitchen atenolol (TENORMIN) 50 MG tablet Take 1 tablet (50 mg total) by mouth daily.  Marland Kitchen atorvastatin (LIPITOR) 10 MG tablet Take 1 tablet (10 mg total) by mouth daily.  Marland Kitchen gabapentin (NEURONTIN) 300 MG capsule Take 300 mg by mouth daily.  Marland Kitchen losartan-hydrochlorothiazide (HYZAAR) 100-25 MG per tablet Take 1 tablet by mouth daily.  . meloxicam (MOBIC) 7.5 MG tablet Take 7.5 mg by mouth daily.  Marland Kitchen omega-3 acid ethyl esters (LOVAZA) 1 G capsule Take 1 capsule by mouth four times daily.  Marland Kitchen omeprazole (PRILOSEC) 40 MG capsule Take 1 capsule (40 mg total) by mouth daily.  . [DISCONTINUED] cefUROXime (CEFTIN) 250 MG tablet Take 1 tablet (250 mg total) by mouth 2 (two) times daily with a meal.   No facility-administered encounter medications on file as of 08/28/2014.    Review of Systems  Constitutional: Negative for appetite change and unexpected weight  change.  HENT: Negative for congestion and sinus pressure.   Respiratory: Negative for cough, chest tightness and shortness of breath.   Cardiovascular: Negative for chest pain, palpitations and leg swelling.  Gastrointestinal: Negative for nausea, vomiting, abdominal pain and diarrhea.  Genitourinary: Negative for dysuria and difficulty urinating.  Musculoskeletal: Positive for back pain.       Back and leg pain as outlined.    Skin: Negative for color change and rash.  Neurological: Positive for headaches (has a history of headaches.  overall improved.  ). Negative for dizziness and light-headedness.  Psychiatric/Behavioral: Negative for dysphoric mood and agitation.       Objective:    Physical Exam  Constitutional: He appears well-developed and well-nourished. No distress.  HENT:  Nose: Nose normal.  Mouth/Throat: Oropharynx is clear and moist.  Neck: Neck supple. No thyromegaly present.  Cardiovascular: Normal rate and regular rhythm.   Pulmonary/Chest: Effort normal and breath sounds normal. No respiratory distress.  Abdominal: Soft. Bowel sounds are normal. There is no tenderness.  Musculoskeletal: He exhibits no edema or tenderness.  Lymphadenopathy:    He has no cervical adenopathy.  Skin: No rash noted. No erythema.  Psychiatric: He has a normal mood and affect. His behavior is normal.    BP 130/84 mmHg  Pulse 65  Temp(Src) 98 F (36.7 C) (Oral)  Ht 5\' 8"  (1.727 m)  Wt 179 lb 8 oz (81.421 kg)  BMI 27.30 kg/m2  SpO2 95% Wt Readings from Last 3 Encounters:  08/28/14 179 lb 8 oz (81.421 kg)  04/24/14 182 lb 6 oz (82.725 kg)  03/21/14 181 lb 8 oz (82.328 kg)     Lab Results  Component Value Date   WBC 5.9 11/19/2013   HGB 14.6 11/19/2013   HCT 43.1 11/19/2013   PLT 208.0 11/19/2013   GLUCOSE 106* 07/24/2014   CHOL 184 07/24/2014   TRIG * 07/24/2014    530.0 Triglyceride is over 400; calculations on Lipids are invalid.   HDL 26.00* 07/24/2014   LDLDIRECT  52.0 07/24/2014   LDLCALC 60 04/24/2013   ALT 17 07/24/2014   AST 19 07/24/2014   NA 140 07/24/2014   K 4.5 07/24/2014   CL 102 07/24/2014   CREATININE 1.07 07/24/2014   BUN 18 07/24/2014   CO2 34* 07/24/2014   TSH 2.86 11/19/2013   PSA 1.20 06/18/2013   HGBA1C 5.6 06/18/2013       Assessment & Plan:   Problem List Items Addressed This Visit    Back pain    Persistent.  Seeing ortho.  Undergoing physical therapy now.  Follow.       Relevant Medications   meloxicam (MOBIC) 7.5 MG tablet   Essential hypertension, benign    Blood pressure under good control.  Continue same medication regimen.  Follow pressures.  Follow metabolic panel.        Relevant Orders   TSH   Basic metabolic panel   Headache    Has a history of chronic headaches.  Overall better now.  Follow.       Relevant Medications   gabapentin (NEURONTIN) 300 MG capsule   meloxicam (MOBIC) 7.5 MG tablet   Other Relevant Orders   CBC with Differential/Platelet   Health care maintenance    Physical 04/24/14.  Colonoscopy 10/02/08.  Due psa.  Will schedule with next labs.        History of colonic polyps    Spoke to Dr Elliot's office.  States due f/u colonoscopy 09/2017.        Pure hypercholesterolemia    Triglycerides are elevated.  Low carb diet.  Take lovaza.  Follow lipid panel.        Relevant Orders   Lipid panel   Hepatic function panel   Scalp lesion - Primary    Refer back to dermatology.        Relevant Orders   Ambulatory referral to Dermatology   Tobacco abuse    Discussed the need to quit smoking.  Is trying to decrease. Follow.         Other Visit Diagnoses    Face lesion        Relevant Orders    Ambulatory referral to Dermatology    Prostate cancer screening        Relevant Orders    PSA        [name], MD

## 2014-08-28 NOTE — Progress Notes (Signed)
Pre visit review using our clinic review tool, if applicable. No additional management support is needed unless otherwise documented below in the visit note. 

## 2014-08-30 ENCOUNTER — Encounter: Payer: Self-pay | Admitting: Internal Medicine

## 2014-08-30 NOTE — Assessment & Plan Note (Signed)
Has a history of chronic headaches.  Overall better now.  Follow.

## 2014-08-30 NOTE — Assessment & Plan Note (Signed)
Persistent.  Seeing ortho.  Undergoing physical therapy now.  Follow.

## 2014-08-30 NOTE — Assessment & Plan Note (Signed)
Triglycerides are elevated.  Low carb diet.  Take lovaza.  Follow lipid panel.

## 2014-08-30 NOTE — Assessment & Plan Note (Signed)
Blood pressure under good control.  Continue same medication regimen.  Follow pressures.  Follow metabolic panel.   

## 2014-08-30 NOTE — Assessment & Plan Note (Signed)
Physical 04/24/14.  Colonoscopy 10/02/08.  Due psa.  Will schedule with next labs.

## 2014-08-30 NOTE — Assessment & Plan Note (Signed)
Spoke to Dr Fiserv office.  States due f/u colonoscopy 09/2017.

## 2014-08-30 NOTE — Assessment & Plan Note (Signed)
Discussed the need to quit smoking.  Is trying to decrease. Follow.

## 2014-08-30 NOTE — Assessment & Plan Note (Signed)
Refer back to dermatology

## 2014-11-17 ENCOUNTER — Other Ambulatory Visit: Payer: Self-pay

## 2014-11-17 MED ORDER — ATORVASTATIN CALCIUM 10 MG PO TABS: 10.0000 mg | ORAL_TABLET | Freq: Every day | ORAL | Status: DC

## 2014-11-17 MED ORDER — LOSARTAN POTASSIUM-HCTZ 100-25 MG PO TABS: 1.0000 | ORAL_TABLET | Freq: Every day | ORAL | Status: DC

## 2014-11-26 ENCOUNTER — Other Ambulatory Visit: Payer: BLUE CROSS/BLUE SHIELD

## 2014-11-27 ENCOUNTER — Other Ambulatory Visit: Payer: Self-pay | Admitting: *Deleted

## 2014-11-27 MED ORDER — AMITRIPTYLINE HCL 50 MG PO TABS
50.0000 mg | ORAL_TABLET | Freq: Every day | ORAL | Status: DC
Start: 2014-11-27 — End: 2015-02-26

## 2014-11-27 MED ORDER — ATENOLOL 50 MG PO TABS: 50.0000 mg | ORAL_TABLET | Freq: Every day | ORAL | Status: DC

## 2014-11-28 ENCOUNTER — Ambulatory Visit (INDEPENDENT_AMBULATORY_CARE_PROVIDER_SITE_OTHER): Payer: BLUE CROSS/BLUE SHIELD | Admitting: Internal Medicine

## 2014-11-28 ENCOUNTER — Encounter: Payer: Self-pay | Admitting: Internal Medicine

## 2014-11-28 VITALS — BP 110/70 | HR 58 | Temp 98.1°F | Resp 18 | Ht 68.0 in | Wt 179.0 lb

## 2014-11-28 DIAGNOSIS — Z86010 Personal history of colonic polyps: Secondary | ICD-10-CM | POA: Diagnosis not present

## 2014-11-28 DIAGNOSIS — R519 Headache, unspecified: Secondary | ICD-10-CM

## 2014-11-28 DIAGNOSIS — E7800 Pure hypercholesterolemia, unspecified: Secondary | ICD-10-CM

## 2014-11-28 DIAGNOSIS — Z720 Tobacco use: Secondary | ICD-10-CM

## 2014-11-28 DIAGNOSIS — R51 Headache: Secondary | ICD-10-CM

## 2014-11-28 DIAGNOSIS — M25551 Pain in right hip: Secondary | ICD-10-CM

## 2014-11-28 DIAGNOSIS — I10 Essential (primary) hypertension: Secondary | ICD-10-CM | POA: Diagnosis not present

## 2014-11-28 MED ORDER — OMEPRAZOLE 40 MG PO CPDR: 40.0000 mg | DELAYED_RELEASE_CAPSULE | Freq: Every day | ORAL | Status: DC

## 2014-11-28 NOTE — Progress Notes (Signed)
Pre-visit discussion using our clinic review tool. No additional management support is needed unless otherwise documented below in the visit note.  

## 2014-11-28 NOTE — Progress Notes (Signed)
Patient ID: Travis Gray, male   DOB: 25-Oct-1955, 59 y.o.   MRN: [id number]   Subjective:    Patient ID: Travis Gray, male    DOB: 09-16-1955, 59 y.o.   MRN: [id number]  HPI  Patient with past history of hypercholesterolemia, chronic headaches, DDD, tobacco abuse and hypertension who comes in today to follow up on these issues.  He tries to stay active.  No cardiac symptoms with increased activity or exertion.  No sob.  Still smoking.  Smoking < 1ppd.  Has cut down and plans to cut down more.  Discussed the need to quit.  No abdominal pain or cramping.  Bowels stable.  Still having right hip and leg pain (right lower back discomfort).  Bothers him when he walks.  Saw ortho.  Took mobic and neurontin.  Went to therapy.  No change.  Discussed referral back to ortho and/or MRI.  He wants to hold at this point.  Wants to try chiropractor.     Past Medical History  Diagnosis Date  . Chronic headaches   . Degenerative disc disease, cervical   . Hypertension   . Hypercholesterolemia   . Tobacco abuse    Past Surgical History  Procedure Laterality Date  . Hernia repair      Inguinal,laparoscopic surgery  . Radio-ablated therapy      completed in the pain clinic   Family History  Problem Relation Age of Onset  . Hypertension Mother   . Hyperlipidemia Father   . Diabetes Maternal Grandmother   . Cancer Paternal Grandfather    Social History   Social History  . Marital Status: Married    Spouse Name: N/A  . Number of Children: N/A  . Years of Education: N/A   Social History Main Topics  . Smoking status: Current Every Day Smoker -- 1.00 packs/day    Types: Cigarettes  . Smokeless tobacco: Never Used  . Alcohol Use: 0.0 oz/week    0 Standard drinks or equivalent per week  . Drug Use: No  . Sexual Activity: Not Asked   Other Topics Concern  . None   Social History Narrative    Outpatient Encounter Prescriptions as of 11/28/2014  Medication Sig  . albuterol (PROVENTIL  HFA;VENTOLIN HFA) 108 (90 BASE) MCG/ACT inhaler Inhale 2 puffs into the lungs every 6 (six) hours as needed for wheezing or shortness of breath.  Marland Kitchen amitriptyline (ELAVIL) 50 MG tablet Take 1 tablet (50 mg total) by mouth at bedtime.  Marland Kitchen atenolol (TENORMIN) 50 MG tablet Take 1 tablet (50 mg total) by mouth daily.  Marland Kitchen atorvastatin (LIPITOR) 10 MG tablet Take 1 tablet (10 mg total) by mouth daily.  Marland Kitchen gabapentin (NEURONTIN) 300 MG capsule Take 300 mg by mouth daily.  Marland Kitchen losartan-hydrochlorothiazide (HYZAAR) 100-25 MG tablet Take 1 tablet by mouth daily.  . meloxicam (MOBIC) 7.5 MG tablet Take 7.5 mg by mouth daily.  Marland Kitchen omega-3 acid ethyl esters (LOVAZA) 1 G capsule Take 1 capsule by mouth four times daily.  Marland Kitchen omeprazole (PRILOSEC) 40 MG capsule Take 1 capsule (40 mg total) by mouth daily.  . [DISCONTINUED] omeprazole (PRILOSEC) 40 MG capsule Take 1 capsule (40 mg total) by mouth daily.   No facility-administered encounter medications on file as of 11/28/2014.    Review of Systems  Constitutional: Negative for appetite change and unexpected weight change.  HENT: Negative for congestion and sinus pressure.   Eyes: Negative for discharge and visual disturbance.  Respiratory: Negative for cough, chest  tightness and shortness of breath.   Cardiovascular: Negative for chest pain, palpitations and leg swelling.  Gastrointestinal: Negative for nausea, vomiting, abdominal pain and diarrhea.  Genitourinary: Negative for dysuria and difficulty urinating.  Musculoskeletal: Negative for joint swelling.       Right hip and right leg pain as outlined.  Mobic, neurontin and physical therapy - not help.    Skin: Negative for color change and rash.  Neurological: Negative for dizziness and light-headedness.       Headaches better.    Psychiatric/Behavioral: Negative for dysphoric mood and agitation.       Objective:     Blood pressure rechecked by me:  120/82  Physical Exam  Constitutional: He appears  well-developed and well-nourished. No distress.  HENT:  Nose: Nose normal.  Mouth/Throat: Oropharynx is clear and moist.  Eyes: Conjunctivae are normal. Right eye exhibits no discharge. Left eye exhibits no discharge.  Neck: Neck supple. No thyromegaly present.  Cardiovascular: Normal rate and regular rhythm.   Pulmonary/Chest: Effort normal and breath sounds normal. No respiratory distress.  Abdominal: Soft. Bowel sounds are normal. There is no tenderness.  Musculoskeletal: He exhibits no edema or tenderness.  Lymphadenopathy:    He has no cervical adenopathy.  Skin: No rash noted. No erythema.  Psychiatric: He has a normal mood and affect. His behavior is normal.    BP 110/70 mmHg  Pulse 58  Temp(Src) 98.1 F (36.7 C) (Oral)  Resp 18  Ht 5\' 8"  (1.727 m)  Wt 179 lb (81.194 kg)  BMI 27.22 kg/m2  SpO2 98% Wt Readings from Last 3 Encounters:  11/28/14 179 lb (81.194 kg)  08/28/14 179 lb 8 oz (81.421 kg)  04/24/14 182 lb 6 oz (82.725 kg)     Lab Results  Component Value Date   WBC 5.9 11/19/2013   HGB 14.6 11/19/2013   HCT 43.1 11/19/2013   PLT 208.0 11/19/2013   GLUCOSE 106* 07/24/2014   CHOL 184 07/24/2014   TRIG * 07/24/2014    530.0 Triglyceride is over 400; calculations on Lipids are invalid.   HDL 26.00* 07/24/2014   LDLDIRECT 52.0 07/24/2014   LDLCALC 60 04/24/2013   ALT 17 07/24/2014   AST 19 07/24/2014   NA 140 07/24/2014   K 4.5 07/24/2014   CL 102 07/24/2014   CREATININE 1.07 07/24/2014   BUN 18 07/24/2014   CO2 34* 07/24/2014   TSH 2.86 11/19/2013   PSA 1.20 06/18/2013   HGBA1C 5.6 06/18/2013       Assessment & Plan:   Problem List Items Addressed This Visit    Essential hypertension, benign - Primary    Blood pressure under good control.  Continue same medication regimen.  Follow pressures.  Follow metabolic panel.        Headache    Has a history of chronic headaches.  Better now.  Follow.        History of colonic polyps    Spoke to  Dr Elliot's office.  States due f/u colonoscopy 09/2017.        Pure hypercholesterolemia    Low cholesterol diet and exercise.  Discussed diet and exercise.  Low carb diet - to help triglycerides.  Follow lipid panel.       Right hip pain    Right hip and leg pain as outlined.  Has seen ortho.  Has been on mobic, neurontin and has tried physical therapy.  No improvement.  We disucssed getting back in with ortho.  He  declines at this time.  Discussed possible MRI.  Wants to try a chiropractor.        Tobacco abuse    Discussed smoking cessation and the need to quit.  He has cut back.  Follow.  Wants to continue to decrease on his own.            [name], MD

## 2014-11-30 ENCOUNTER — Encounter: Payer: Self-pay | Admitting: Internal Medicine

## 2014-11-30 NOTE — Assessment & Plan Note (Signed)
Has a history of chronic headaches.  Better now.  Follow.

## 2014-11-30 NOTE — Assessment & Plan Note (Signed)
Blood pressure under good control.  Continue same medication regimen.  Follow pressures.  Follow metabolic panel.   

## 2014-11-30 NOTE — Assessment & Plan Note (Signed)
Spoke to Dr Elliot's office.  States due f/u colonoscopy 09/2017.   

## 2014-11-30 NOTE — Assessment & Plan Note (Signed)
Discussed smoking cessation and the need to quit.  He has cut back.  Follow.  Wants to continue to decrease on his own.

## 2014-11-30 NOTE — Assessment & Plan Note (Signed)
Low cholesterol diet and exercise.  Discussed diet and exercise.  Low carb diet - to help triglycerides.  Follow lipid panel.

## 2014-11-30 NOTE — Assessment & Plan Note (Signed)
Right hip and leg pain as outlined.  Has seen ortho.  Has been on mobic, neurontin and has tried physical therapy.  No improvement.  We disucssed getting back in with ortho.  He declines at this time.  Discussed possible MRI.  Wants to try a chiropractor.

## 2014-12-11 ENCOUNTER — Other Ambulatory Visit (INDEPENDENT_AMBULATORY_CARE_PROVIDER_SITE_OTHER): Payer: BLUE CROSS/BLUE SHIELD

## 2014-12-11 DIAGNOSIS — Z125 Encounter for screening for malignant neoplasm of prostate: Secondary | ICD-10-CM

## 2014-12-11 DIAGNOSIS — R519 Headache, unspecified: Secondary | ICD-10-CM

## 2014-12-11 DIAGNOSIS — R51 Headache: Secondary | ICD-10-CM

## 2014-12-11 DIAGNOSIS — E7800 Pure hypercholesterolemia, unspecified: Secondary | ICD-10-CM

## 2014-12-11 DIAGNOSIS — I10 Essential (primary) hypertension: Secondary | ICD-10-CM

## 2014-12-11 LAB — HEPATIC FUNCTION PANEL
ALT: 17 U/L (ref 0–53)
AST: 17 U/L (ref 0–37)
Albumin: 4.5 g/dL (ref 3.5–5.2)
Alkaline Phosphatase: 61 U/L (ref 39–117)
BILIRUBIN DIRECT: 0.1 mg/dL (ref 0.0–0.3)
Total Bilirubin: 0.6 mg/dL (ref 0.2–1.2)
Total Protein: 6.8 g/dL (ref 6.0–8.3)

## 2014-12-11 LAB — CBC WITH DIFFERENTIAL/PLATELET
Basophils Absolute: 0 10*3/uL (ref 0.0–0.1)
Basophils Relative: 0.4 % (ref 0.0–3.0)
EOS ABS: 0.2 10*3/uL (ref 0.0–0.7)
EOS PCT: 2.7 % (ref 0.0–5.0)
HEMATOCRIT: 43.4 % (ref 39.0–52.0)
HEMOGLOBIN: 15 g/dL (ref 13.0–17.0)
LYMPHS PCT: 37.1 % (ref 12.0–46.0)
Lymphs Abs: 2 10*3/uL (ref 0.7–4.0)
MCHC: 34.5 g/dL (ref 30.0–36.0)
MCV: 93.9 fl (ref 78.0–100.0)
MONO ABS: 0.6 10*3/uL (ref 0.1–1.0)
Monocytes Relative: 10.1 % (ref 3.0–12.0)
Neutro Abs: 2.7 10*3/uL (ref 1.4–7.7)
Neutrophils Relative %: 49.7 % (ref 43.0–77.0)
Platelets: 224 10*3/uL (ref 150.0–400.0)
RBC: 4.62 Mil/uL (ref 4.22–5.81)
RDW: 13.1 % (ref 11.5–15.5)
WBC: 5.5 10*3/uL (ref 4.0–10.5)

## 2014-12-11 LAB — BASIC METABOLIC PANEL
BUN: 21 mg/dL (ref 6–23)
CO2: 29 meq/L (ref 19–32)
Calcium: 9.9 mg/dL (ref 8.4–10.5)
Chloride: 101 mEq/L (ref 96–112)
Creatinine, Ser: 1.11 mg/dL (ref 0.40–1.50)
GFR: 72.42 mL/min (ref >60.00)
GLUCOSE: 99 mg/dL (ref 70–99)
POTASSIUM: 5.4 meq/L — AB (ref 3.5–5.1)
SODIUM: 140 meq/L (ref 135–145)

## 2014-12-11 LAB — TSH: TSH: 2.52 u[IU]/mL (ref 0.35–4.50)

## 2014-12-11 LAB — LIPID PANEL
Cholesterol: 203 mg/dL — ABNORMAL HIGH (ref 0–200)
HDL: 29.6 mg/dL — ABNORMAL LOW (ref >39.00)
Total CHOL/HDL Ratio: 7

## 2014-12-11 LAB — LDL CHOLESTEROL, DIRECT: Direct LDL: 72 mg/dL

## 2014-12-11 LAB — PSA: PSA: 1.08 ng/mL (ref 0.10–4.00)

## 2014-12-16 ENCOUNTER — Other Ambulatory Visit: Payer: Self-pay | Admitting: Internal Medicine

## 2014-12-16 ENCOUNTER — Telehealth: Payer: Self-pay | Admitting: *Deleted

## 2014-12-16 ENCOUNTER — Other Ambulatory Visit (INDEPENDENT_AMBULATORY_CARE_PROVIDER_SITE_OTHER): Payer: BLUE CROSS/BLUE SHIELD

## 2014-12-16 DIAGNOSIS — E875 Hyperkalemia: Secondary | ICD-10-CM

## 2014-12-16 LAB — POTASSIUM: Potassium: 4.5 mEq/L (ref 3.5–5.1)

## 2014-12-16 NOTE — Progress Notes (Signed)
Order placed for f/u potassium.  

## 2014-12-16 NOTE — Telephone Encounter (Signed)
Order placed for f/u potassium.  

## 2014-12-16 NOTE — Telephone Encounter (Signed)
Labs and dx?  

## 2014-12-17 ENCOUNTER — Encounter: Payer: Self-pay | Admitting: *Deleted

## 2015-01-08 ENCOUNTER — Telehealth: Payer: Self-pay | Admitting: Internal Medicine

## 2015-01-08 NOTE — Telephone Encounter (Signed)
[facility] Patient Name: Travis Gray DOB: 11-05-1955 Initial Comment Caller states her husband started throwing up last night and is still in the bed, throwing up all day. He can't keep anything. Has diarrhea. Nurse Assessment Nurse: Martyn Ehrich RN, Felicia Date/Time (Eastern Time): 01/08/2015 4:54:06 PM Confirm and document reason for call. If symptomatic, describe symptoms. ---PT has been vomiting and diarrhea since last night. No fever. (severe) Has the patient traveled out of the country within the last 30 days? ---No Does the patient have any new or worsening symptoms? ---Yes Will a triage be completed? ---Yes Related visit to physician within the last 2 weeks? ---No Does the PT have any chronic conditions? (i.e. diabetes, asthma, etc.) ---No Is this a behavioral health or substance abuse call? ---NoGuidelines Guideline Title Affirmed Question Affirmed Notes Vomiting [1] SEVERE vomiting (e.g., 6 or more times/day) AND [2] present > 8 hours Final Disposition User Go to ED Now (or PCP triage) Martyn Ehrich, RN, Felicia Comments Told caller a message will be put in the chart that patient wants to be called back bc he is refusing to go to ER Told wife it could be dangerous if he doesnt go. Wife wants him to - he is refusing. Told her if he becomes confused or can't stand or faints call 911 referenced vomiting triage guide Referrals GO TO FACILITY REFUSED Disagree/Comply: Disagree Disagree/Comply Reason: Disagree with instructions Call Id: [id number]

## 2015-01-09 NOTE — Telephone Encounter (Signed)
Please advise if there is anything I can do to assist

## 2015-01-09 NOTE — Telephone Encounter (Signed)
This message is from yesterday pm.  Just received.  Is he still vomiting or having diarrhea now?  Any other symptoms, i.e., abdominal pain, fever, etc?  Just need update to know how to advise since this was yesterday 5:00 pm message.  Thanks

## 2015-01-09 NOTE — Telephone Encounter (Signed)
Continue bland foods and stay hydrated.  Keep [place] posted and let us know if need anything.

## 2015-01-09 NOTE — Telephone Encounter (Signed)
Called and talked to patients wife.  She stated that the patient is doing much better today.  Has eaten some rice with broth and is drinking well.

## 2015-01-29 ENCOUNTER — Other Ambulatory Visit: Payer: Self-pay | Admitting: Internal Medicine

## 2015-02-26 ENCOUNTER — Other Ambulatory Visit: Payer: Self-pay | Admitting: Internal Medicine

## 2015-05-01 ENCOUNTER — Ambulatory Visit (INDEPENDENT_AMBULATORY_CARE_PROVIDER_SITE_OTHER): Payer: BLUE CROSS/BLUE SHIELD | Admitting: Internal Medicine

## 2015-05-01 ENCOUNTER — Encounter: Payer: Self-pay | Admitting: Internal Medicine

## 2015-05-01 VITALS — BP 122/80 | HR 71 | Temp 98.1°F | Resp 18 | Ht 67.0 in | Wt 176.5 lb

## 2015-05-01 DIAGNOSIS — R519 Headache, unspecified: Secondary | ICD-10-CM

## 2015-05-01 DIAGNOSIS — R0989 Other specified symptoms and signs involving the circulatory and respiratory systems: Secondary | ICD-10-CM

## 2015-05-01 DIAGNOSIS — M5442 Lumbago with sciatica, left side: Secondary | ICD-10-CM | POA: Diagnosis not present

## 2015-05-01 DIAGNOSIS — Z720 Tobacco use: Secondary | ICD-10-CM

## 2015-05-01 DIAGNOSIS — E7800 Pure hypercholesterolemia, unspecified: Secondary | ICD-10-CM

## 2015-05-01 DIAGNOSIS — R51 Headache: Secondary | ICD-10-CM

## 2015-05-01 DIAGNOSIS — Z0000 Encounter for general adult medical examination without abnormal findings: Secondary | ICD-10-CM | POA: Diagnosis not present

## 2015-05-01 DIAGNOSIS — Z86010 Personal history of colonic polyps: Secondary | ICD-10-CM

## 2015-05-01 DIAGNOSIS — I10 Essential (primary) hypertension: Secondary | ICD-10-CM | POA: Diagnosis not present

## 2015-05-01 MED ORDER — LOSARTAN POTASSIUM-HCTZ 100-25 MG PO TABS: 1.0000 | ORAL_TABLET | Freq: Every day | ORAL | Status: DC

## 2015-05-01 MED ORDER — ATORVASTATIN CALCIUM 10 MG PO TABS: 10.0000 mg | ORAL_TABLET | Freq: Every day | ORAL | Status: DC

## 2015-05-01 NOTE — Progress Notes (Signed)
Patient ID: Travis Gray, male   DOB: October 10, 1955, 60 y.o.   MRN: [id number]   Subjective:    Patient ID: Travis Gray, male    DOB: 10/20/1955, 60 y.o.   MRN: [id number]  HPI  Patient here for his physical exam.  He reports he is doing well except for continued back and right leg pain.  Has seen ortho.  Has been to physical therapy and chiropractor.  Has taken antiinflammatories and gabapentin.  Nothing seems to help.  He is ready to have something more done.  Tries to stay active.  No cardiac symptoms with increased activity or exertion.  No sob.  Still smoking.  Is trying to decrease the amount.  Have smoked for over 40 years.  Discussed the need to quit.  No acid reflux.  No abdominal pain or cramping.  Bowels stable.  No urine change.  Handling stress.  Stress is better.     Past Medical History  Diagnosis Date  . Chronic headaches   . Degenerative disc disease, cervical   . Hypertension   . Hypercholesterolemia   . Tobacco abuse    Past Surgical History  Procedure Laterality Date  . Hernia repair      Inguinal,laparoscopic surgery  . Radio-ablated therapy      completed in the pain clinic   Family History  Problem Relation Age of Onset  . Hypertension Mother   . Hyperlipidemia Father   . Diabetes Maternal Grandmother   . Cancer Paternal Grandfather    Social History   Social History  . Marital Status: Married    Spouse Name: N/A  . Number of Children: N/A  . Years of Education: N/A   Social History Main Topics  . Smoking status: Current Every Day Smoker -- 1.00 packs/day    Types: Cigarettes  . Smokeless tobacco: Never Used  . Alcohol Use: 0.0 oz/week    0 Standard drinks or equivalent per week  . Drug Use: No  . Sexual Activity: Not Asked   Other Topics Concern  . None   Social History Narrative    Outpatient Encounter Prescriptions as of 05/01/2015  Medication Sig  . albuterol (PROVENTIL HFA;VENTOLIN HFA) 108 (90 BASE) MCG/ACT inhaler Inhale 2 puffs  into the lungs every 6 (six) hours as needed for wheezing or shortness of breath.  Marland Kitchen amitriptyline (ELAVIL) 50 MG tablet TAKE 1 TABLET BY MOUTH AT BEDTIME  . atenolol (TENORMIN) 50 MG tablet TAKE 1 TABLET BY MOUTH ONCE DAILY.  Marland Kitchen atorvastatin (LIPITOR) 10 MG tablet Take 1 tablet (10 mg total) by mouth daily.  Marland Kitchen gabapentin (NEURONTIN) 300 MG capsule Take 300 mg by mouth daily.  Marland Kitchen losartan-hydrochlorothiazide (HYZAAR) 100-25 MG tablet Take 1 tablet by mouth daily.  . meloxicam (MOBIC) 7.5 MG tablet Take 7.5 mg by mouth daily.  Marland Kitchen omega-3 acid ethyl esters (LOVAZA) 1 g capsule TAKE 1 CAPSULE BY MOUTH 4 TIMES DAILY.  Marland Kitchen omeprazole (PRILOSEC) 40 MG capsule Take 1 capsule (40 mg total) by mouth daily.  . [DISCONTINUED] atorvastatin (LIPITOR) 10 MG tablet Take 1 tablet (10 mg total) by mouth daily.  . [DISCONTINUED] losartan-hydrochlorothiazide (HYZAAR) 100-25 MG tablet Take 1 tablet by mouth daily.   No facility-administered encounter medications on file as of 05/01/2015.    Review of Systems  Constitutional: Negative for appetite change and unexpected weight change.  HENT: Negative for congestion and sinus pressure.   Eyes: Negative for pain and visual disturbance.  Respiratory: Negative for cough, chest tightness  and shortness of breath.   Cardiovascular: Negative for chest pain, palpitations and leg swelling.  Gastrointestinal: Negative for nausea, vomiting, abdominal pain and diarrhea.  Genitourinary: Negative for dysuria and difficulty urinating.  Musculoskeletal: Negative for back pain and joint swelling.  Skin: Negative for color change and rash.  Neurological: Negative for dizziness, light-headedness and headaches.  Hematological: Negative for adenopathy. Does not bruise/bleed easily.  Psychiatric/Behavioral: Negative for dysphoric mood and agitation.       Objective:    Physical Exam  Constitutional: He is oriented to person, place, and time. He appears well-developed and  well-nourished. No distress.  HENT:  Head: Normocephalic and atraumatic.  Nose: Nose normal.  Mouth/Throat: Oropharynx is clear and moist. No oropharyngeal exudate.  Eyes: Conjunctivae are normal. Right eye exhibits no discharge. Left eye exhibits no discharge.  Neck: Neck supple. No thyromegaly present.  Cardiovascular: Normal rate and regular rhythm.   Pulmonary/Chest: Breath sounds normal. No respiratory distress. He has no wheezes.  Abdominal: Soft. Bowel sounds are normal. There is no tenderness.  Genitourinary:  Rectal exam -  no palpable prostate nodules.  Heme negative.   Musculoskeletal: He exhibits no edema or tenderness.  Lymphadenopathy:    He has no cervical adenopathy.  Neurological: He is alert and oriented to person, place, and time.  Skin: Skin is warm and dry. No rash noted. No erythema.  Psychiatric: He has a normal mood and affect. His behavior is normal.    BP 122/80 mmHg  Pulse 71  Temp(Src) 98.1 F (36.7 C) (Oral)  Resp 18  Ht 5\' 7"  (1.702 m)  Wt 176 lb 8 oz (80.06 kg)  BMI 27.64 kg/m2  SpO2 98% Wt Readings from Last 3 Encounters:  05/01/15 176 lb 8 oz (80.06 kg)  11/28/14 179 lb (81.194 kg)  08/28/14 179 lb 8 oz (81.421 kg)     Lab Results  Component Value Date   WBC 5.5 12/11/2014   HGB 15.0 12/11/2014   HCT 43.4 12/11/2014   PLT 224.0 12/11/2014   GLUCOSE 99 12/11/2014   CHOL 203* 12/11/2014   TRIG * 12/11/2014    472.0 Triglyceride is over 400; calculations on Lipids are invalid.   HDL 29.60* 12/11/2014   LDLDIRECT 72.0 12/11/2014   LDLCALC 60 04/24/2013   ALT 17 12/11/2014   AST 17 12/11/2014   NA 140 12/11/2014   K 4.5 12/16/2014   CL 101 12/11/2014   CREATININE 1.11 12/11/2014   BUN 21 12/11/2014   CO2 29 12/11/2014   TSH 2.52 12/11/2014   PSA 1.08 12/11/2014   HGBA1C 5.6 06/18/2013       Assessment & Plan:   Problem List Items Addressed This Visit    Absent pulse in lower extremity    I could not appreciate a pulse in  the right lower extremity (no DP or PT pulse palpable).  Will have vascular surgery evaluate with ABIs.        Relevant Orders   Ambulatory referral to Vascular Surgery   Back pain    Persistent pain despite medication, therapy and chiropractor.  Discussed today.  Ready for referral back to ortho to discuss other treatment options.  Question if would benefit from referral to Dr Sharlet Salina.  Will refer to ortho to discuss. Pt comfortable with this plan.       Relevant Orders   Ambulatory referral to Orthopedic Surgery   Essential hypertension, benign    Blood pressure under good control.  Continue same medication regimen.  Follow pressures.  Follow metabolic panel.        Relevant Medications   atorvastatin (LIPITOR) 10 MG tablet   losartan-hydrochlorothiazide (HYZAAR) 100-25 MG tablet   Other Relevant Orders   Basic metabolic panel   Headache    Not a significant issue for him now.  Follow.       Health care maintenance    Physical today 05/01/15.  Colonoscopy per report due 09/2017.  12/11/14 - psa 1.08.        History of colonic polyps    Spoke to Dr Elliot's office.  Due f/u colonoscopy 09/2017.       Pure hypercholesterolemia    Low cholesterol diet and exercise.  Follow lipid panel.  He is trying to adjust his diet.        Relevant Medications   atorvastatin (LIPITOR) 10 MG tablet   losartan-hydrochlorothiazide (HYZAAR) 100-25 MG tablet   Other Relevant Orders   Lipid panel   Hepatic function panel   Tobacco abuse    Discussed the need to quit.  He has cut back.  Discussed screening CT.  He is in agreement.  Will contact [facility] to arrange.            [name], MD

## 2015-05-01 NOTE — Progress Notes (Signed)
Pre-visit discussion using our clinic review tool. No additional management support is needed unless otherwise documented below in the visit note.  

## 2015-05-03 ENCOUNTER — Encounter: Payer: Self-pay | Admitting: Internal Medicine

## 2015-05-03 DIAGNOSIS — I739 Peripheral vascular disease, unspecified: Secondary | ICD-10-CM | POA: Insufficient documentation

## 2015-05-03 NOTE — Assessment & Plan Note (Signed)
Discussed the need to quit.  He has cut back.  Discussed screening CT.  He is in agreement.  Will contact [facility] to arrange.

## 2015-05-03 NOTE — Assessment & Plan Note (Signed)
Not a significant issue for him now.  Follow.  

## 2015-05-03 NOTE — Assessment & Plan Note (Signed)
I could not appreciate a pulse in the right lower extremity (no DP or PT pulse palpable).  Will have vascular surgery evaluate with ABIs.

## 2015-05-03 NOTE — Assessment & Plan Note (Signed)
Low cholesterol diet and exercise.  Follow lipid panel.  He is trying to adjust his diet.

## 2015-05-03 NOTE — Assessment & Plan Note (Signed)
Spoke to Dr [employer] office.  Due f/u colonoscopy 09/2017.

## 2015-05-03 NOTE — Assessment & Plan Note (Signed)
Physical today 05/01/15.  Colonoscopy per report due 09/2017.  12/11/14 - psa 1.08.

## 2015-05-03 NOTE — Assessment & Plan Note (Signed)
Persistent pain despite medication, therapy and chiropractor.  Discussed today.  Ready for referral back to ortho to discuss other treatment options.  Question if would benefit from referral to Dr Sharlet Salina.  Will refer to ortho to discuss. Pt comfortable with this plan.

## 2015-05-03 NOTE — Assessment & Plan Note (Signed)
Blood pressure under good control.  Continue same medication regimen.  Follow pressures.  Follow metabolic panel.   

## 2015-05-08 ENCOUNTER — Telehealth: Payer: Self-pay | Admitting: *Deleted

## 2015-05-08 NOTE — Telephone Encounter (Signed)
Received referral for low dose lung cancer screening CT scan. Attempted to leave Voicemail at phone number listed in EMR for patient to call me back to facilitate scheduling scan, however, was not able to do so.

## 2015-05-12 ENCOUNTER — Telehealth: Payer: Self-pay | Admitting: Internal Medicine

## 2015-05-12 DIAGNOSIS — R6889 Other general symptoms and signs: Secondary | ICD-10-CM

## 2015-05-12 DIAGNOSIS — M79604 Pain in right leg: Secondary | ICD-10-CM

## 2015-05-12 NOTE — Telephone Encounter (Signed)
Record request faxed to [facility]

## 2015-05-12 NOTE — Telephone Encounter (Signed)
I have not seen the results.  Need results.  Thanks

## 2015-05-12 NOTE — Telephone Encounter (Signed)
Pt called to find out the results of vascular study that was done on 05/06/15. Please advise pt/msn

## 2015-05-13 NOTE — Telephone Encounter (Signed)
Report received & placed in yellow folder. Wife sent a message again today through her mychart asking for the results. I will let her know that he will be contacted tomorrow.

## 2015-05-14 NOTE — Telephone Encounter (Signed)
Order placed for referral.  Please schedule an appt as asap.  Pt and his wife are anxious.

## 2015-05-15 ENCOUNTER — Other Ambulatory Visit (INDEPENDENT_AMBULATORY_CARE_PROVIDER_SITE_OTHER): Payer: BLUE CROSS/BLUE SHIELD

## 2015-05-15 DIAGNOSIS — I10 Essential (primary) hypertension: Secondary | ICD-10-CM | POA: Diagnosis not present

## 2015-05-15 DIAGNOSIS — E7800 Pure hypercholesterolemia, unspecified: Secondary | ICD-10-CM

## 2015-05-15 LAB — BASIC METABOLIC PANEL
BUN: 22 mg/dL (ref 6–23)
CALCIUM: 9.3 mg/dL (ref 8.4–10.5)
CO2: 29 meq/L (ref 19–32)
Chloride: 101 mEq/L (ref 96–112)
Creatinine, Ser: 1.12 mg/dL (ref 0.40–1.50)
GFR: 71.57 mL/min (ref >60.00)
GLUCOSE: 105 mg/dL — AB (ref 70–99)
POTASSIUM: 4.2 meq/L (ref 3.5–5.1)
SODIUM: 138 meq/L (ref 135–145)

## 2015-05-15 LAB — LIPID PANEL
CHOL/HDL RATIO: 7
CHOLESTEROL: 185 mg/dL (ref 0–200)
HDL: 28.1 mg/dL — ABNORMAL LOW (ref >39.00)
TRIGLYCERIDES: 441 mg/dL — AB (ref 0.0–149.0)

## 2015-05-15 LAB — HEPATIC FUNCTION PANEL
ALBUMIN: 4.3 g/dL (ref 3.5–5.2)
ALT: 17 U/L (ref 0–53)
AST: 18 U/L (ref 0–37)
Alkaline Phosphatase: 59 U/L (ref 39–117)
BILIRUBIN TOTAL: 0.6 mg/dL (ref 0.2–1.2)
Bilirubin, Direct: 0.1 mg/dL (ref 0.0–0.3)
Total Protein: 7.1 g/dL (ref 6.0–8.3)

## 2015-05-15 LAB — LDL CHOLESTEROL, DIRECT: Direct LDL: 57 mg/dL

## 2015-05-18 ENCOUNTER — Telehealth: Payer: Self-pay | Admitting: *Deleted

## 2015-05-18 ENCOUNTER — Encounter: Payer: Self-pay | Admitting: *Deleted

## 2015-05-18 NOTE — Telephone Encounter (Signed)
Received referral for initial lung cancer screening scan. Contacted patient and answered questions about screening. He is scheduled for a vascular procedure later this week and is requesting that I follow up with him at a later date.

## 2015-05-19 ENCOUNTER — Other Ambulatory Visit: Payer: Self-pay | Admitting: Vascular Surgery

## 2015-05-20 ENCOUNTER — Other Ambulatory Visit
Admission: RE | Admit: 2015-05-20 | Discharge: 2015-05-20 | Disposition: A | Discharge status: Home / Self Care | Payer: BLUE CROSS/BLUE SHIELD | Source: Ambulatory Visit | Attending: Vascular Surgery | Admitting: Vascular Surgery

## 2015-05-20 DIAGNOSIS — I70211 Atherosclerosis of native arteries of extremities with intermittent claudication, right leg: Secondary | ICD-10-CM | POA: Insufficient documentation

## 2015-05-20 LAB — CREATININE, SERUM
Creatinine, Ser: 1.1 mg/dL (ref 0.61–1.24)
GFR calc Af Amer: >60 mL/min (ref >60)
GFR calc non Af Amer: >60 mL/min (ref >60)

## 2015-05-20 LAB — BUN: BUN: 19 mg/dL (ref 6–20)

## 2015-05-21 ENCOUNTER — Encounter: Payer: Self-pay | Admitting: *Deleted

## 2015-05-21 ENCOUNTER — Ambulatory Visit
Admission: RE | Admit: 2015-05-21 | Discharge: 2015-05-21 | Disposition: A | Discharge status: Home / Self Care | Payer: BLUE CROSS/BLUE SHIELD | Source: Ambulatory Visit | Attending: Vascular Surgery | Admitting: Vascular Surgery

## 2015-05-21 ENCOUNTER — Encounter
Admission: RE | Disposition: A | Discharge status: Home / Self Care | Payer: Self-pay | Source: Ambulatory Visit | Attending: Vascular Surgery

## 2015-05-21 DIAGNOSIS — Z8489 Family history of other specified conditions: Secondary | ICD-10-CM | POA: Insufficient documentation

## 2015-05-21 DIAGNOSIS — E7800 Pure hypercholesterolemia, unspecified: Secondary | ICD-10-CM | POA: Insufficient documentation

## 2015-05-21 DIAGNOSIS — Z79899 Other long term (current) drug therapy: Secondary | ICD-10-CM | POA: Diagnosis not present

## 2015-05-21 DIAGNOSIS — I10 Essential (primary) hypertension: Secondary | ICD-10-CM | POA: Diagnosis not present

## 2015-05-21 DIAGNOSIS — F1010 Alcohol abuse, uncomplicated: Secondary | ICD-10-CM | POA: Insufficient documentation

## 2015-05-21 DIAGNOSIS — F17210 Nicotine dependence, cigarettes, uncomplicated: Secondary | ICD-10-CM | POA: Diagnosis not present

## 2015-05-21 DIAGNOSIS — M7989 Other specified soft tissue disorders: Secondary | ICD-10-CM | POA: Diagnosis not present

## 2015-05-21 DIAGNOSIS — M79609 Pain in unspecified limb: Secondary | ICD-10-CM | POA: Diagnosis not present

## 2015-05-21 DIAGNOSIS — I70211 Atherosclerosis of native arteries of extremities with intermittent claudication, right leg: Secondary | ICD-10-CM | POA: Insufficient documentation

## 2015-05-21 DIAGNOSIS — R200 Anesthesia of skin: Secondary | ICD-10-CM | POA: Diagnosis not present

## 2015-05-21 DIAGNOSIS — Z809 Family history of malignant neoplasm, unspecified: Secondary | ICD-10-CM | POA: Insufficient documentation

## 2015-05-21 DIAGNOSIS — Z716 Tobacco abuse counseling: Secondary | ICD-10-CM | POA: Diagnosis not present

## 2015-05-21 HISTORY — PX: PERIPHERAL VASCULAR CATHETERIZATION: SHX172C

## 2015-05-21 HISTORY — DX: Peripheral vascular disease, unspecified: I73.9

## 2015-05-21 SURGERY — LOWER EXTREMITY ANGIOGRAPHY
Anesthesia: Moderate Sedation | Laterality: Right

## 2015-05-21 MED ORDER — METHYLPREDNISOLONE SODIUM SUCC 125 MG IJ SOLR: 125.0000 mg | INTRAMUSCULAR | Status: DC | PRN

## 2015-05-21 MED ORDER — LIDOCAINE-EPINEPHRINE (PF) 1 %-1:200000 IJ SOLN
INTRAMUSCULAR | Status: DC | PRN
  Administered 2015-05-21: 10 mL via INTRADERMAL

## 2015-05-21 MED ORDER — GUAIFENESIN-DM 100-10 MG/5ML PO SYRP: 15.0000 mL | ORAL_SOLUTION | ORAL | Status: DC | PRN

## 2015-05-21 MED ORDER — CEFUROXIME SODIUM 1.5 G IJ SOLR
1.5000 g | INTRAMUSCULAR | Status: AC
  Administered 2015-05-21: 1.5 g via INTRAVENOUS

## 2015-05-21 MED ORDER — HEPARIN (PORCINE) IN NACL 2-0.9 UNIT/ML-% IJ SOLN
INTRAMUSCULAR | Status: AC
  Filled 2015-05-21: qty 1000

## 2015-05-21 MED ORDER — CLOPIDOGREL BISULFATE 75 MG PO TABS
ORAL_TABLET | ORAL | Status: AC
  Administered 2015-05-21: 10:00:00
  Filled 2015-05-21: qty 1

## 2015-05-21 MED ORDER — ONDANSETRON HCL 4 MG/2ML IJ SOLN: 4.0000 mg | Freq: Four times a day (QID) | INTRAMUSCULAR | Status: DC | PRN

## 2015-05-21 MED ORDER — HYDRALAZINE HCL 20 MG/ML IJ SOLN: 5.0000 mg | INTRAMUSCULAR | Status: DC | PRN

## 2015-05-21 MED ORDER — IOPAMIDOL (ISOVUE-300) INJECTION 61%
INTRAVENOUS | Status: DC | PRN
  Administered 2015-05-21: 120 mL via INTRA_ARTERIAL

## 2015-05-21 MED ORDER — FENTANYL CITRATE (PF) 100 MCG/2ML IJ SOLN
INTRAMUSCULAR | Status: DC | PRN
  Administered 2015-05-21 (×4): 50 ug via INTRAVENOUS

## 2015-05-21 MED ORDER — SODIUM CHLORIDE 0.9 % IV SOLN
INTRAVENOUS | Status: DC
  Administered 2015-05-21: 08:00:00 via INTRAVENOUS

## 2015-05-21 MED ORDER — METOPROLOL TARTRATE 5 MG/5ML IV SOLN: 2.0000 mg | INTRAVENOUS | Status: DC | PRN

## 2015-05-21 MED ORDER — OMEGA-3-ACID ETHYL ESTERS 1 G PO CAPS: 1.0000 g | ORAL_CAPSULE | Freq: Every day | ORAL | Status: DC

## 2015-05-21 MED ORDER — HYDROMORPHONE HCL 1 MG/ML IJ SOLN
INTRAMUSCULAR | Status: AC
  Filled 2015-05-21: qty 1

## 2015-05-21 MED ORDER — LIDOCAINE-EPINEPHRINE (PF) 1 %-1:200000 IJ SOLN
INTRAMUSCULAR | Status: AC
Start: 2015-05-21 — End: 2015-05-21
  Filled 2015-05-21: qty 30

## 2015-05-21 MED ORDER — ACETAMINOPHEN 325 MG RE SUPP: 325.0000 mg | RECTAL | Status: DC | PRN

## 2015-05-21 MED ORDER — OXYCODONE-ACETAMINOPHEN 5-325 MG PO TABS: 1.0000 | ORAL_TABLET | ORAL | Status: DC | PRN

## 2015-05-21 MED ORDER — FAMOTIDINE 20 MG PO TABS: 40.0000 mg | ORAL_TABLET | ORAL | Status: DC | PRN

## 2015-05-21 MED ORDER — DEXTROSE 5 % IV SOLN
INTRAVENOUS | Status: AC
  Filled 2015-05-21 (×22): qty 1.5

## 2015-05-21 MED ORDER — SODIUM CHLORIDE FLUSH 0.9 % IV SOLN
INTRAVENOUS | Status: AC
  Filled 2015-05-21: qty 50

## 2015-05-21 MED ORDER — MIDAZOLAM HCL 5 MG/5ML IJ SOLN
INTRAMUSCULAR | Status: AC
Start: 2015-05-21 — End: 2015-05-21
  Filled 2015-05-21: qty 5

## 2015-05-21 MED ORDER — CLOPIDOGREL BISULFATE 75 MG PO TABS: 75.0000 mg | ORAL_TABLET | Freq: Every day | ORAL | Status: DC

## 2015-05-21 MED ORDER — PHENOL 1.4 % MT LIQD: 1.0000 | OROMUCOSAL | Status: DC | PRN

## 2015-05-21 MED ORDER — LABETALOL HCL 5 MG/ML IV SOLN: 10.0000 mg | INTRAVENOUS | Status: DC | PRN

## 2015-05-21 MED ORDER — FENTANYL CITRATE (PF) 100 MCG/2ML IJ SOLN
INTRAMUSCULAR | Status: AC
  Filled 2015-05-21: qty 2

## 2015-05-21 MED ORDER — HEPARIN SODIUM (PORCINE) 1000 UNIT/ML IJ SOLN
INTRAMUSCULAR | Status: DC | PRN
  Administered 2015-05-21: 2500 [IU] via INTRAVENOUS
  Administered 2015-05-21: 2000 [IU] via INTRAVENOUS

## 2015-05-21 MED ORDER — SODIUM CHLORIDE 0.9 % IV SOLN: 500.0000 mL | Freq: Once | INTRAVENOUS | Status: DC | PRN

## 2015-05-21 MED ORDER — MIDAZOLAM HCL 2 MG/2ML IJ SOLN
INTRAMUSCULAR | Status: DC | PRN
  Administered 2015-05-21: 1 mg via INTRAVENOUS
  Administered 2015-05-21: 2 mg via INTRAVENOUS
  Administered 2015-05-21 (×2): 1 mg via INTRAVENOUS

## 2015-05-21 MED ORDER — HYDROMORPHONE HCL 1 MG/ML IJ SOLN
1.0000 mg | Freq: Once | INTRAMUSCULAR | Status: AC
  Administered 2015-05-21: 1 mg via INTRAVENOUS

## 2015-05-21 MED ORDER — ACETAMINOPHEN 325 MG PO TABS: 325.0000 mg | ORAL_TABLET | ORAL | Status: DC | PRN

## 2015-05-21 MED ORDER — HYDROMORPHONE HCL 1 MG/ML IJ SOLN: 0.5000 mg | INTRAMUSCULAR | Status: DC | PRN

## 2015-05-21 MED ORDER — HEPARIN SODIUM (PORCINE) 1000 UNIT/ML IJ SOLN
INTRAMUSCULAR | Status: AC
  Filled 2015-05-21: qty 1

## 2015-05-21 SURGICAL SUPPLY — 26 items
BALLN LUTONIX 6X150X130 (BALLOONS) ×4
BALLN LUTONIX DCB 6X100X130 (BALLOONS) ×4
BALLN ULTRVRSE 7X60X75C (BALLOONS) ×4
BALLOON LUTONIX 6X150X130 (BALLOONS) ×2 IMPLANT
BALLOON LUTONIX DCB 6X100X130 (BALLOONS) ×2 IMPLANT
BALLOON ULTRVRSE 7X60X75C (BALLOONS) ×2 IMPLANT
CANNULA 5F STIFF (CANNULA) ×4 IMPLANT
CATH 5FR REUT (CATHETERS) ×4 IMPLANT
CATH PIG 70CM (CATHETERS) ×4 IMPLANT
CATH RIM 65CM (CATHETERS) ×4 IMPLANT
CATH TORCON 5FR 0.38 (CATHETERS) ×4 IMPLANT
DEVICE PRESTO INFLATION (MISCELLANEOUS) ×8 IMPLANT
DEVICE STARCLOSE SE CLOSURE (Vascular Products) ×8 IMPLANT
ENSNARE 9-15 (MISCELLANEOUS) ×4 IMPLANT
GLIDEWIRE ADV .035X180CM (WIRE) ×8 IMPLANT
GLIDEWIRE ADV .035X260CM (WIRE) ×4 IMPLANT
LIFESTENT 7X100X130 (Permanent Stent) ×4 IMPLANT
PACK ANGIOGRAPHY (CUSTOM PROCEDURE TRAY) ×4 IMPLANT
SHEATH ANL2 6FRX45 HC (SHEATH) ×4 IMPLANT
SHEATH BRITE TIP 5FRX11 (SHEATH) ×4 IMPLANT
SHEATH BRITE TIP 6FRX11 (SHEATH) ×8 IMPLANT
STENT LIFESTAR 8X60X80 (Permanent Stent) ×4 IMPLANT
STENT OMNILINK ELITE 8X59X80 (Permanent Stent) ×8 IMPLANT
SYR MEDRAD MARK V 150ML (SYRINGE) ×4 IMPLANT
TUBING CONTRAST HIGH PRESS 72 (TUBING) ×4 IMPLANT
WIRE J 3MM .035X145CM (WIRE) ×4 IMPLANT

## 2015-05-21 NOTE — H&P (Signed)
  [facility] History & Physical Update  The patient was interviewed and re-examined.  The patient's previous History and Physical has been reviewed and is unchanged.  There is no change in the plan of care. We plan to proceed with the scheduled procedure.  DEW,JASON, MD  05/21/2015, 7:59 AM

## 2015-05-21 NOTE — Discharge Instructions (Signed)
Angiogram, Care After °Refer to this sheet in the next few weeks. These instructions provide you with information about caring for yourself after your procedure. Your health care provider may also give you more specific instructions. Your treatment has been planned according to current medical practices, but problems sometimes occur. Call your health care provider if you have any problems or questions after your procedure. °WHAT TO EXPECT AFTER THE PROCEDURE °After your procedure, it is typical to have the following: °· Bruising at the catheter insertion site that usually fades within 1-2 weeks. °· Blood collecting in the tissue (hematoma) that may be painful to the touch. It should usually decrease in size and tenderness within 1-2 weeks. °HOME CARE INSTRUCTIONS °· Take medicines only as directed by your health care provider. °· You may shower 24-48 hours after the procedure or as directed by your health care provider. Remove the bandage (dressing) and gently wash the site with plain soap and water. Pat the area dry with a clean towel. Do not rub the site, because this may cause bleeding. °· Do not take baths, swim, or use a hot tub until your health care provider approves. °· Check your insertion site every day for redness, swelling, or drainage. °· Do not apply powder or lotion to the site. °· Do not lift over 10 lb (4.5 kg) for 5 days after your procedure or as directed by your health care provider. °· Ask your health care provider when it is okay to: °¨ Return to work or school. °¨ Resume usual physical activities or sports. °¨ Resume sexual activity. °· Do not drive home if you are discharged the same day as the procedure. Have someone else drive you. °· You may drive 24 hours after the procedure unless otherwise instructed by your health care provider. °· Do not operate machinery or power tools for 24 hours after the procedure or as directed by your health care provider. °· If your procedure was done as an  outpatient procedure, which means that you went home the same day as your procedure, a responsible adult should be with you for the first 24 hours after you arrive home. °· Keep all follow-up visits as directed by your health care provider. This is important. °SEEK MEDICAL CARE IF: °· You have a fever. °· You have chills. °· You have increased bleeding from the catheter insertion site. Hold pressure on the site. °SEEK IMMEDIATE MEDICAL CARE IF: °· You have unusual pain at the catheter insertion site. °· You have redness, warmth, or swelling at the catheter insertion site. °· You have drainage (other than a small amount of blood on the dressing) from the catheter insertion site. °· The catheter insertion site is bleeding, and the bleeding does not stop after 30 minutes of holding steady pressure on the site. °· The area near or just beyond the catheter insertion site becomes pale, cool, tingly, or numb. °  °This information is not intended to replace advice given to you by your health care provider. Make sure you discuss any questions you have with your health care provider. °  °Document Released: 07/29/2004 Document Revised: 01/31/2014 Document Reviewed: 06/13/2012 °[employer]vier Interactive Patient Education ©2016 [employer] ° °

## 2015-05-21 NOTE — Op Note (Signed)
[facility] Percutaneous Study/Intervention Procedural Note   Date of Surgery: 05/21/2015  Surgeon(s):DEW,JASON   Assistants:none  Pre-operative Diagnosis: PAD with claudication  Post-operative diagnosis: Same  Procedure(s) Performed: 1. Ultrasound guidance for vascular access bilateral femoral arteries 2. Catheter placement into right superficial femoral artery from left femoral approach and into left common femoral artery from right femoral approach 3. Aortogram and selective right lower extremity angiogram 4. Percutaneous transluminal angioplasty of right distal superficial femoral artery with 6 mm diameter by 10 cm length Lutonix drug-coated angioplasty balloon 5. Percutaneous transluminal angioplasty of right common iliac artery and external iliac artery with 6 mm diameter by 15 cm length Lutonix drug-coated angioplasty balloon  6.  Self-expanding stent placement to the right superficial femoral artery with 7 mm diameter by 10 cm length life stent  7.  Kissing balloon stent placement to bilateral common iliac arteries with 21m diameter by 59 mm length Omnilink stents  8.  Self-expanding stent placement to the right external iliac artery with 834mdiameter by 6 cm length life Star stent for greater than 50% residual stenosis after angioplasty  9. StarClose closure device bilateral femoral arteries  EBL: 30 cc  Contrast: 120 cc  Fluoro Time: 21.6 minutes  Moderate Conscious Sedation Time: approximately 75 minutes using 5 mg of Versed and 100 mcg of Fentanyl  Indications: Patient is a [age].o.male with short distance claudication. The patient has noninvasive study showing markedly reduced right ABI. The patient is brought in for angiography for further evaluation and potential treatment. Risks and benefits are discussed and informed consent is obtained  Procedure: The  patient was identified and appropriate procedural time out was performed. The patient was then placed supine on the table and prepped and draped in the usual sterile fashion.Moderate conscious sedation was administered during a face to face encounter with the patient throughout the procedure with my supervision of the RN administering medicines and monitoring the patient's vital signs, pulse oximetry, telemetry and mental status throughout from the start of the procedure until the patient was taken to the recovery room. Ultrasound was used to evaluate the left common femoral artery. It was patent . A digital ultrasound image was acquired. A Seldinger needle was used to access the left common femoral artery under direct ultrasound guidance and a permanent image was performed. A 0.035 J wire was advanced without resistance and a 5Fr sheath was placed. Pigtail catheter was placed into the aorta and an AP aortogram was performed. This demonstrated normal renal arteries and normal aorta with a near flush occlusion of the right common iliac artery.  The right common femoral artery reconstituted distally.  The left common iliac artery had about a 60-65% stenosis. I then decided to access the right common femoral artery. This was done under direct ultrasound guidance with a micropuncture needle and a permanent image was recorded. A micropuncture wire and sheath were then placed. I upsized to a 6 [place]ath on the right. The patient was systemically heparinized. Despite multiple attempts with Kumpe catheter and advantage wire I could not across the occlusion in the right iliac system and get into the aorta from the right femoral approach initially. I then elected to try to go up and over. I first started with a rim catheter but this did not gain adequate purchase. I was then able to use a VS 1 catheter from the left femoral approach and get access into the right common iliac artery occlusion. With the advantage  wire was able to navigate through the occlusion and get catheter access in the proximal superficial femoral artery on the right from the left femoral approach.. Selective right lower extremity angiogram was then performed. This demonstrated about a 75% stenosis in the mid to distal superficial femoral artery with two-vessel runoff distally. The patient was systemically heparinized and a 6 [place] Ansell sheath was then placed over the [employer] wire. I then used a Kumpe catheter and the advantage wire to navigate through the SFA stenosis. I then began treatment. A 6 mm diameter by 10 cm length Lutonix drug-coated angioplasty balloon was inflated to 14 atm for 1 minute in the distal superficial femoral artery stenosis. Completion angiogram showed greater than 50% residual stenosis, so a 7 mm diameter by 10 cm length life stent was deployed in the mid to distal superficial femoral artery and postdilated with a 6 mm balloon with an excellent angiographic completion result and less than 20% residual stenosis. I then from the left femoral approach treated the right common iliac artery and external iliac artery occlusion with angioplasty. A 6 mm diameter by 15 cm length Lutonix drug-coated angioplasty balloon was inflated to 14 atm for 1 minute. Completion angiogram showed high-grade residual stenosis throughout both iliac systems. I was still unable to get easy access from the right femoral approach even after angioplasty so I elected to snare the wire from the left. The small snare was then placed through the left femoral sheath in the right common femoral artery. I then passed an advantage wire through the right femoral sheath access and snared this wire pulling it out through the sheath in the common femoral artery. I then advanced a Kumpe catheter into the left common femoral artery from the right femoral approach and pulled the left Ansell sheath back to the left iliac system. At this point a wire and catheter  were placed into the aorta and pulled out of the left femoral system from the right femoral approach and the left femoral access was exchanged for a short 6 [place] sheath. I now proceeded with treatment of the iliac lesions. I used kissing balloon stents to address both the moderate left common iliac artery stenosis and the right common iliac artery occlusion. 68m diameter by 59 mm length balloon expandable stents were deployed from the distal aorta through the common iliac arteries bilaterally. The inflations were to 12 atm bilaterally. On the left, this treated the disease successfully. On the right, there was still significant residual stenosis and dissection in the external iliac artery. An 8 mm diameter by 6 cm length self-expanding stent was then deployed in the right external iliac artery and postdilated with a 7 mm balloon with an excellent angiographic completion result and less than 20% residual stenosis. At this point, his perfusion appeared to be reasonably normal and I felt we had done as much as could be possible from a percutaneous approach. I elected to terminate the procedure. The sheath was removed and StarClose closure device was deployed in the left femoral artery first with excellent hemostatic result. StarClose closure device was then deployed in the right femoral artery with excellent hemostatic result as well. The patient was taken to the recovery room in stable condition having tolerated the procedure well.  Findings:  Aortogram: Normal renal arteries and aorta. 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 Left common iliac artery stenosis of about 60%. Right Lower Extremity: 75% stenosis in the mid to distal right superficial femoral artery with two-vessel  runoff distally.   Disposition: Patient was taken to the recovery room in stable condition having tolerated the procedure well.  Complications: None  DEW,JASON 05/21/2015 10:08 AM

## 2015-05-24 ENCOUNTER — Emergency Department
Admission: EM | Admit: 2015-05-24 | Discharge: 2015-05-24 | Disposition: A | Discharge status: Home / Self Care | Payer: BLUE CROSS/BLUE SHIELD | Attending: Student | Admitting: Student

## 2015-05-24 ENCOUNTER — Emergency Department: Payer: BLUE CROSS/BLUE SHIELD

## 2015-05-24 ENCOUNTER — Encounter: Payer: Self-pay | Admitting: Radiology

## 2015-05-24 DIAGNOSIS — Z79899 Other long term (current) drug therapy: Secondary | ICD-10-CM | POA: Diagnosis not present

## 2015-05-24 DIAGNOSIS — M5030 Other cervical disc degeneration, unspecified cervical region: Secondary | ICD-10-CM | POA: Diagnosis not present

## 2015-05-24 DIAGNOSIS — Z791 Long term (current) use of non-steroidal anti-inflammatories (NSAID): Secondary | ICD-10-CM | POA: Diagnosis not present

## 2015-05-24 DIAGNOSIS — Z8679 Personal history of other diseases of the circulatory system: Secondary | ICD-10-CM | POA: Diagnosis not present

## 2015-05-24 DIAGNOSIS — R0789 Other chest pain: Secondary | ICD-10-CM | POA: Diagnosis not present

## 2015-05-24 DIAGNOSIS — R079 Chest pain, unspecified: Secondary | ICD-10-CM

## 2015-05-24 DIAGNOSIS — I10 Essential (primary) hypertension: Secondary | ICD-10-CM | POA: Diagnosis not present

## 2015-05-24 DIAGNOSIS — F17210 Nicotine dependence, cigarettes, uncomplicated: Secondary | ICD-10-CM | POA: Insufficient documentation

## 2015-05-24 LAB — PROTIME-INR
INR: 1.09
Prothrombin Time: 14.3 seconds (ref 11.4–15.0)

## 2015-05-24 LAB — CBC
HCT: 35.4 % — ABNORMAL LOW (ref 40.0–52.0)
HEMOGLOBIN: 12.5 g/dL — AB (ref 13.0–18.0)
MCH: 31.9 pg (ref 26.0–34.0)
MCHC: 35.4 g/dL (ref 32.0–36.0)
MCV: 90.1 fL (ref 80.0–100.0)
Platelets: 208 10*3/uL (ref 150–440)
RBC: 3.93 MIL/uL — AB (ref 4.40–5.90)
RDW: 13.2 % (ref 11.5–14.5)
WBC: 8.6 10*3/uL (ref 3.8–10.6)

## 2015-05-24 LAB — TROPONIN I: Troponin I: <0.03 ng/mL (ref <0.031)

## 2015-05-24 LAB — COMPREHENSIVE METABOLIC PANEL
ALBUMIN: 3.7 g/dL (ref 3.5–5.0)
ALK PHOS: 48 U/L (ref 38–126)
ALT: 16 U/L — ABNORMAL LOW (ref 17–63)
ANION GAP: 10 (ref 5–15)
AST: 19 U/L (ref 15–41)
BUN: 29 mg/dL — ABNORMAL HIGH (ref 6–20)
CALCIUM: 8.5 mg/dL — AB (ref 8.9–10.3)
CHLORIDE: 100 mmol/L — AB (ref 101–111)
CO2: 25 mmol/L (ref 22–32)
Creatinine, Ser: 1.88 mg/dL — ABNORMAL HIGH (ref 0.61–1.24)
GFR calc Af Amer: 44 mL/min — ABNORMAL LOW (ref >60)
GFR calc non Af Amer: 38 mL/min — ABNORMAL LOW (ref >60)
GLUCOSE: 136 mg/dL — AB (ref 65–99)
Potassium: 3.5 mmol/L (ref 3.5–5.1)
SODIUM: 135 mmol/L (ref 135–145)
Total Bilirubin: 0.8 mg/dL (ref 0.3–1.2)
Total Protein: 7 g/dL (ref 6.5–8.1)

## 2015-05-24 LAB — APTT: APTT: 30 s (ref 24–36)

## 2015-05-24 MED ORDER — SODIUM CHLORIDE 0.9 % IV BOLUS (SEPSIS)
1000.0000 mL | Freq: Once | INTRAVENOUS | Status: AC
  Administered 2015-05-24: 1000 mL via INTRAVENOUS

## 2015-05-24 MED ORDER — IOPAMIDOL (ISOVUE-370) INJECTION 76%
60.0000 mL | Freq: Once | INTRAVENOUS | Status: AC | PRN
  Administered 2015-05-24: 60 mL via INTRAVENOUS

## 2015-05-24 MED ORDER — SODIUM CHLORIDE 0.9 % IV BOLUS (SEPSIS): 1000.0000 mL | Freq: Once | INTRAVENOUS | Status: DC

## 2015-05-24 NOTE — ED Notes (Signed)
Pt states that he had stents placed in his legs Thursday for decreased blood flow to the legs, pt states that today he was up moving around and started having some left sided chest pain, states that he didn't feel right, states that he felt like his left arm "had a sunburn sensation" pt took his bp and was 85/60 then 91/59

## 2015-05-24 NOTE — ED Provider Notes (Signed)
[facility] Emergency Department Provider Note   ____________________________________________  Time seen: Approximately 4:46 PM  I have reviewed the triage vital signs and the nursing notes.   HISTORY  Chief Complaint Chest Pain    HPI SALEEM FUESS is a 60 y.o. male with history of hypertension, hyperlipidemia, tobacco use, peripheral vascular disease with claudication status post bilateral common iliac stents, right common and external iliac stents and right facial femoral artery stents performed by Dr. Lucky Cowboy on 05/21/2015 presents for evaluation of lancinating left-sided chest pain today, gradual onset, intermittent, no modifying factors. She reports that since this afternoon he has had sharp pains in the left chest. Typically they last for a few seconds and then resolved but will recur. They're not worsened with exertion or movements, not associated with any shortness of breath, he has had no sudden sweating. He reports he has had chills but no fever. He reports that he took his blood pressure today and it was very low, systolic was in the [id number] and this prompted him to present to the emergency department for evaluation. He has had this chest pain before on and off. He reports his groin sites are healing well and that his bilateral leg complaints are much improved since stents were placed.   Past Medical History  Diagnosis Date  . Chronic headaches   . Degenerative disc disease, cervical   . Hypertension   . Hypercholesterolemia   . Tobacco abuse   . Peripheral vascular disease [facility])     Patient Active Problem List   Diagnosis Date Noted  . Absent pulse in lower extremity 05/03/2015  . Scalp lesion 08/28/2014  . Chest pain 04/27/2014  . Right hip pain 04/27/2014  . Health care maintenance 04/27/2014  . Sinusitis 03/23/2014  . Numbness and tingling in hands 03/23/2014  . Muscle cramps 03/23/2014  . Back pain 03/23/2014  . Right shoulder pain  06/18/2013  . Right elbow pain 12/02/2012  . History of colonic polyps 03/07/2012  . Tobacco abuse 03/07/2012  . Pure hypercholesterolemia 03/02/2012  . Headache 03/02/2012  . Essential hypertension, benign 03/02/2012    Past Surgical History  Procedure Laterality Date  . Hernia repair      Inguinal,laparoscopic surgery  . Radio-ablated therapy      completed in the pain clinic  . Peripheral vascular catheterization Right 05/21/2015    Procedure: Lower Extremity Angiography;  Surgeon: Algernon Huxley, MD;  Location: [place] [facility];  Service: Cardiovascular;  Laterality: Right;  . Peripheral vascular catheterization  05/21/2015    Procedure: Lower Extremity Intervention;  Surgeon: Algernon Huxley, MD;  Location: [place] [facility];  Service: Cardiovascular;;    Current Outpatient Rx  Name  Route  Sig  Dispense  Refill  . albuterol (PROVENTIL HFA;VENTOLIN HFA) 108 (90 BASE) MCG/ACT inhaler   Inhalation   Inhale 2 puffs into the lungs every 6 (six) hours as needed for wheezing or shortness of breath.   1 Inhaler   0   . amitriptyline (ELAVIL) 50 MG tablet   Oral   Take 50 mg by mouth daily.         Marland Kitchen atenolol (TENORMIN) 50 MG tablet      TAKE 1 TABLET BY MOUTH ONCE DAILY.   30 tablet   3   . atorvastatin (LIPITOR) 10 MG tablet   Oral   Take 1 tablet (10 mg total) by mouth daily.   30 tablet   11   .  clopidogrel (PLAVIX) 75 MG tablet   Oral   Take 1 tablet (75 mg total) by mouth daily.   30 tablet   11   . gabapentin (NEURONTIN) 300 MG capsule   Oral   Take 300 mg by mouth daily.      0   . losartan-hydrochlorothiazide (HYZAAR) 100-25 MG tablet   Oral   Take 1 tablet by mouth daily.   30 tablet   11   . meloxicam (MOBIC) 7.5 MG tablet   Oral   Take 7.5 mg by mouth daily.      0   . omega-3 acid ethyl esters (LOVAZA) 1 g capsule   Oral   Take 1 capsule (1 g total) by mouth daily.   120 capsule   3   . omeprazole (PRILOSEC) 40 MG capsule   Oral    Take 1 capsule (40 mg total) by mouth daily.   30 capsule   11     Allergies Review of patient's allergies indicates no known allergies.  Family History  Problem Relation Age of Onset  . Hypertension Mother   . Hyperlipidemia Father   . Diabetes Maternal Grandmother   . Cancer Paternal Grandfather     Social History Social History  Substance Use Topics  . Smoking status: Current Every Day Smoker -- 1.00 packs/day for 36 years    Types: Cigarettes  . Smokeless tobacco: Never Used  . Alcohol Use: 0.0 oz/week    0 Standard drinks or equivalent per week     Comment: very occasional alcohol use    Review of Systems Constitutional: No fever/chills Eyes: No visual changes. ENT: No sore throat. Cardiovascular: + chest pain. Respiratory: Denies shortness of breath. Gastrointestinal: No abdominal pain.  No nausea, no vomiting.  No diarrhea.  No constipation. Genitourinary: Negative for dysuria. Musculoskeletal: Negative for back pain. Skin: Negative for rash. Neurological: Negative for headaches, focal weakness or numbness.  10-point ROS otherwise negative.  ____________________________________________   PHYSICAL EXAM:  VITAL SIGNS: ED Triage Vitals  Enc Vitals Group     BP 05/24/15 1617 102/63 mmHg     Pulse Rate 05/24/15 1617 72     Resp 05/24/15 1617 18     Temp 05/24/15 1617 98.3 F (36.8 C)     Temp Source 05/24/15 1617 Oral     SpO2 05/24/15 1617 97 %     Weight 05/24/15 1617 175 lb (79.379 kg)     Height 05/24/15 1617 5\' 10"  (1.778 m)     Head Cir --      Peak Flow --      Pain Score 05/24/15 1617 4     Pain Loc --      Pain Edu? --      Excl. in [place]? --     Constitutional: Alert and oriented. Well appearing and in no acute distress. Eyes: Conjunctivae are normal. PERRL. EOMI. Head: Atraumatic. Nose: No congestion/rhinnorhea. Mouth/Throat: Mucous membranes are moist.  Oropharynx non-erythematous. Neck: No stridor.   Cardiovascular: Normal rate,  regular rhythm. Grossly normal heart sounds.  Good peripheral circulation. Respiratory: Normal respiratory effort.  No retractions. Lungs CTAB. Gastrointestinal: Soft and nontender. No distention.  No CVA tenderness. Genitourinary: deferred Musculoskeletal: No lower extremity tenderness nor edema.  No joint effusions. Groin puncture sites appear to be healing well without hematoma, no drainage, no erythema. Bilateral lower extremities are warm, well-perfused with brisk capillary refill. Neurologic:  Normal speech and language. No gross focal neurologic deficits are appreciated.  No gait instability.  Skin:  Skin is warm, dry and intact. No rash noted. Psychiatric: Mood and affect are normal. Speech and behavior are normal.  ____________________________________________   LABS (all labs ordered are listed, but only abnormal results are displayed)  Labs Reviewed  CBC - Abnormal; Notable for the following:    RBC 3.93 (*)    Hemoglobin 12.5 (*)    HCT 35.4 (*)    All other components within normal limits  COMPREHENSIVE METABOLIC PANEL - Abnormal; Notable for the following:    Chloride 100 (*)    Glucose, Bld 136 (*)    BUN 29 (*)    Creatinine, Ser 1.88 (*)    Calcium 8.5 (*)    ALT 16 (*)    GFR calc non Af Amer 38 (*)    GFR calc Af Amer 44 (*)    All other components within normal limits  TROPONIN I  PROTIME-INR  APTT  TROPONIN I   ____________________________________________  EKG  ED ECG REPORT I, Joanne Gavel, the attending physician, personally viewed and interpreted this ECG.   Date: 05/24/2015  EKG Time: 16:12  Rate: 72  Rhythm: normal EKG, normal sinus rhythm  Axis: normal  Intervals:none  ST&T Change: No acute ST elevation.  ____________________________________________  RADIOLOGY  CXR IMPRESSION: No acute cardiopulmonary abnormality seen. ____________________________________________   PROCEDURES  Procedure(s) performed: None  Critical Care  performed: No  ____________________________________________   INITIAL IMPRESSION / ASSESSMENT AND PLAN / ED COURSE  Pertinent labs & imaging results that were available during my care of the patient were reviewed by me and considered in my medical decision making (see chart for details).  ALISA APPLEYARD is a 60 y.o. male with history of hypertension, hyperlipidemia, tobacco use, peripheral vascular disease with claudication status post bilateral common iliac stents, right common and external iliac stents and right facial femoral artery stents performed by Dr. Lucky Cowboy on 05/21/2015 presents for evaluation of lancinating left-sided chest pain today. On exam, he is well-appearing and in no acute distress. He currently has had some hypotension with a systolic blood pressure in the 90s however this is been responsive to IV fluids. The remainder of his vital signs are stable, he is afebrile. He appears to be healing well from his bilateral lower extremity stents and his limbs are warm and well perfused. EKG is reassuring, not consistent with acute ischemia and he certainly does not give a typical story for ACS. Initial troponin is negative, we'll get a second troponin. We'll obtain CTA chest to rule out PE given his new hypotension, recent surgery. Continue liberal IV fluids. CBC shows mild anemia with hemoglobin 12.5. CMP with BUN and creatinine elevation, creatinine is 1.88, continue IV fluids. Normal coags. Patient has refused any pain medication stating that he has no pain currently. Reassess for disposition.  ----------------------------------------- 7:39 PM on 05/24/2015 ----------------------------------------- Patient continues to appear well. He remains chest pain-free has not had any recurrence of chest pain since arrival to the emergency department. His Doppler DP pulses in the feet bilaterally. 2 sets of troponins are negative, I doubt that this represents ACS. CTA chest negative for PE. He is  already on Plavix and took it today.  Blood pressure at the time of discharge is 111/78. He will not take his antihypertensive medications tomorow and will call his doctor about how to proceed in terms of restarting his medications. We discussed return precautions, need for close PCP follow-up and he and his wife at  bedside are comfortable with the discharge plan. DC home.  ____________________________________________   FINAL CLINICAL IMPRESSION(S) / ED DIAGNOSES  Final diagnoses:  Chest pain, unspecified chest pain type      NEW MEDICATIONS STARTED DURING THIS VISIT:  New Prescriptions   No medications on file     Note:  This document was prepared using Dragon voice recognition software and may include unintentional dictation errors.    Joanne Gavel, MD 05/24/15 [phone]

## 2015-05-25 ENCOUNTER — Telehealth: Payer: Self-pay | Admitting: Internal Medicine

## 2015-05-25 NOTE — Telephone Encounter (Signed)
Patient wife stated his better he was seating outside in the sun. I told patient to write down BP readings and bring with him to visit. The wife was glad we are able to get him in.

## 2015-05-25 NOTE — Telephone Encounter (Signed)
Pt had 4 stents put into his leg on Thursday. Pt was at the ED this weekend for his blood pressure. (80/50) Pt was advised to not take his blood pressure this morning. Pt's wife does not know what to do next. Please call her at [phone].

## 2015-05-25 NOTE — Telephone Encounter (Signed)
Patients wife was called and pt gave consent to speak with her. She stated that after leaving the hospital on Thursday from surgery pt's BP was at 180/106. Patient started complaining of left arm burning, chest pain, and not feeling well. When taken to hospital they ran a CT scan of chest it was negative, EKG- normal, Labs drawn twice no Heartattack enzymes found, and Blood clot was ruled out as well. Pt was given fluid and told to stop BP medication and continue blood thinner medication. As of today 05/25/15 patient did not take BP medication and BP was 124/84 @12 :30p.m. Patient was scheduled for 05/28/15 @ 10 a.m with you for ED follow up.

## 2015-05-25 NOTE — Telephone Encounter (Signed)
How is he feeling now.  I would like for him to take it easy.  Monitor blood pressures.  Make sure ok with appt 05/28/15.  Can call in readings if any questions.

## 2015-05-26 ENCOUNTER — Telehealth: Payer: Self-pay | Admitting: *Deleted

## 2015-05-26 NOTE — Telephone Encounter (Signed)
Received referral for initial lung cancer screening scan. Contacted patient and obtained smoking history as well as answering questions related to screening process. Patient is tentatively scheduled for shared decision making visit and CT scan on 06/05/15, pending insurance approval from business office.

## 2015-05-28 ENCOUNTER — Ambulatory Visit: Payer: BLUE CROSS/BLUE SHIELD | Admitting: Internal Medicine

## 2015-05-29 ENCOUNTER — Ambulatory Visit (INDEPENDENT_AMBULATORY_CARE_PROVIDER_SITE_OTHER): Payer: BLUE CROSS/BLUE SHIELD | Admitting: Internal Medicine

## 2015-05-29 ENCOUNTER — Encounter: Payer: Self-pay | Admitting: Internal Medicine

## 2015-05-29 VITALS — BP 102/62 | HR 62 | Temp 98.0°F | Resp 18 | Ht 67.0 in | Wt 178.0 lb

## 2015-05-29 DIAGNOSIS — R748 Abnormal levels of other serum enzymes: Secondary | ICD-10-CM | POA: Diagnosis not present

## 2015-05-29 DIAGNOSIS — D649 Anemia, unspecified: Secondary | ICD-10-CM | POA: Diagnosis not present

## 2015-05-29 DIAGNOSIS — Z720 Tobacco use: Secondary | ICD-10-CM

## 2015-05-29 DIAGNOSIS — R0989 Other specified symptoms and signs involving the circulatory and respiratory systems: Secondary | ICD-10-CM

## 2015-05-29 DIAGNOSIS — E7800 Pure hypercholesterolemia, unspecified: Secondary | ICD-10-CM

## 2015-05-29 DIAGNOSIS — I10 Essential (primary) hypertension: Secondary | ICD-10-CM

## 2015-05-29 LAB — CBC WITH DIFFERENTIAL/PLATELET
BASOS ABS: 66 {cells}/uL (ref 0–200)
Basophils Relative: 1 %
Eosinophils Absolute: 198 cells/uL (ref 15–500)
Eosinophils Relative: 3 %
HEMATOCRIT: 34.8 % — AB (ref 38.5–50.0)
HEMOGLOBIN: 11.7 g/dL — AB (ref 13.2–17.1)
LYMPHS ABS: 2640 {cells}/uL (ref 850–3900)
Lymphocytes Relative: 40 %
MCH: 30.9 pg (ref 27.0–33.0)
MCHC: 33.6 g/dL (ref 32.0–36.0)
MCV: 91.8 fL (ref 80.0–100.0)
MONO ABS: 660 {cells}/uL (ref 200–950)
MPV: 9.7 fL (ref 7.5–12.5)
Monocytes Relative: 10 %
NEUTROS PCT: 46 %
Neutro Abs: 3036 cells/uL (ref 1500–7800)
Platelets: 299 10*3/uL (ref 140–400)
RBC: 3.79 MIL/uL — ABNORMAL LOW (ref 4.20–5.80)
RDW: 13.2 % (ref 11.0–15.0)
WBC: 6.6 10*3/uL (ref 3.8–10.8)

## 2015-05-29 LAB — BASIC METABOLIC PANEL
BUN: 19 mg/dL (ref 7–25)
CO2: 24 mmol/L (ref 20–31)
Calcium: 8.8 mg/dL (ref 8.6–10.3)
Chloride: 106 mmol/L (ref 98–110)
Creat: 1.25 mg/dL (ref 0.70–1.33)
GLUCOSE: 90 mg/dL (ref 65–99)
POTASSIUM: 4.7 mmol/L (ref 3.5–5.3)
SODIUM: 142 mmol/L (ref 135–146)

## 2015-05-29 NOTE — Progress Notes (Signed)
Pre-visit discussion using our clinic review tool. No additional management support is needed unless otherwise documented below in the visit note.  

## 2015-05-29 NOTE — Progress Notes (Signed)
Patient ID: Travis Gray, male   DOB: 06/06/1955, 60 y.o.   MRN: [id number]   Subjective:    Patient ID: Travis Gray, male    DOB: 03-17-1955, 60 y.o.   MRN: [id number]  HPI  Patient here for ER follow up.  Is s/p bilateral common iliac stents and external iliac stents and right femoral artery stent on 05/21/15.  On 05/24/15, started feeling funny.  Had some left side chest pain.  Blood pressure low.  Went to ER.  Note reviewed.  Was given fluids.  CT angiogram - negative for PE.  Enzymes ok.  EKG ok.  Cr increased.  He was instructed to remain off his blood pressure medication and follow up.  He remained off his medication for 24-48 hours and blood pressure started increasing.  He is back on his blood pressure medication now.  No further chest pain.  Blood pressure doing better.  No sob.  Has not smoked for a few days.  Feels like back to his baseline.  Is back at work.     Past Medical History  Diagnosis Date  . Chronic headaches   . Degenerative disc disease, cervical   . Hypertension   . Hypercholesterolemia   . Tobacco abuse   . Peripheral vascular disease [facility])    Past Surgical History  Procedure Laterality Date  . Hernia repair      Inguinal,laparoscopic surgery  . Radio-ablated therapy      completed in the pain clinic  . Peripheral vascular catheterization Right 05/21/2015    Procedure: Lower Extremity Angiography;  Surgeon: Algernon Huxley, MD;  Location: [place] [facility];  Service: Cardiovascular;  Laterality: Right;  . Peripheral vascular catheterization  05/21/2015    Procedure: Lower Extremity Intervention;  Surgeon: Algernon Huxley, MD;  Location: [place] [facility];  Service: Cardiovascular;;   Family History  Problem Relation Age of Onset  . Hypertension Mother   . Hyperlipidemia Father   . Diabetes Maternal Grandmother   . Cancer Paternal Grandfather    Social History   Social History  . Marital Status: Married    Spouse Name: N/A  . Number of Children: N/A    . Years of Education: N/A   Social History Main Topics  . Smoking status: Current Every Day Smoker -- 1.00 packs/day for 36 years    Types: Cigarettes  . Smokeless tobacco: Never Used     Comment: pt stopped a few days ago  . Alcohol Use: 0.0 oz/week    0 Standard drinks or equivalent per week     Comment: very occasional alcohol use  . Drug Use: No  . Sexual Activity: Not Asked   Other Topics Concern  . None   Social History Narrative    Outpatient Encounter Prescriptions as of 05/29/2015  Medication Sig  . albuterol (PROVENTIL HFA;VENTOLIN HFA) 108 (90 BASE) MCG/ACT inhaler Inhale 2 puffs into the lungs every 6 (six) hours as needed for wheezing or shortness of breath.  Marland Kitchen amitriptyline (ELAVIL) 50 MG tablet Take 50 mg by mouth daily.  Marland Kitchen atenolol (TENORMIN) 50 MG tablet TAKE 1 TABLET BY MOUTH ONCE DAILY.  Marland Kitchen atorvastatin (LIPITOR) 10 MG tablet Take 1 tablet (10 mg total) by mouth daily.  . clopidogrel (PLAVIX) 75 MG tablet Take 1 tablet (75 mg total) by mouth daily.  Marland Kitchen gabapentin (NEURONTIN) 300 MG capsule Take 300 mg by mouth daily.  Marland Kitchen losartan-hydrochlorothiazide (HYZAAR) 100-25 MG tablet Take 1 tablet by mouth daily.  Marland Kitchen  meloxicam (MOBIC) 7.5 MG tablet Take 7.5 mg by mouth daily.  Marland Kitchen omega-3 acid ethyl esters (LOVAZA) 1 g capsule Take 1 capsule (1 g total) by mouth daily.  Marland Kitchen omeprazole (PRILOSEC) 40 MG capsule Take 1 capsule (40 mg total) by mouth daily.   No facility-administered encounter medications on file as of 05/29/2015.    Review of Systems  Constitutional: Negative for appetite change and unexpected weight change.  HENT: Negative for congestion and sinus pressure.   Respiratory: Negative for cough, chest tightness and shortness of breath.   Cardiovascular: Negative for palpitations and leg swelling.       Previous chest pain.  Resolved.   Gastrointestinal: Negative for nausea, vomiting, abdominal pain and diarrhea.  Genitourinary: Negative for dysuria and difficulty  urinating.  Musculoskeletal: Negative for back pain and joint swelling.  Skin: Negative for color change and rash.  Neurological: Negative for dizziness and headaches.  Psychiatric/Behavioral: Negative for dysphoric mood and agitation.       Objective:     Blood pressure rechecked by me:  120/72  Physical Exam  Constitutional: He appears well-developed and well-nourished. No distress.  HENT:  Nose: Nose normal.  Mouth/Throat: Oropharynx is clear and moist.  Neck: Neck supple.  Cardiovascular: Normal rate and regular rhythm.   Pulmonary/Chest: Effort normal and breath sounds normal. No respiratory distress.  Abdominal: Soft. Bowel sounds are normal. There is no tenderness.  Musculoskeletal: He exhibits no edema or tenderness.  Lymphadenopathy:    He has no cervical adenopathy.  Skin: No rash noted. No erythema.  Resolving hematomas - bilateral femoral region.  Bilateral bruits.    Psychiatric: He has a normal mood and affect. His behavior is normal.    BP 102/62 mmHg  Pulse 62  Temp(Src) 98 F (36.7 C) (Oral)  Resp 18  Ht 5' 7"  (1.702 m)  Wt 178 lb (80.74 kg)  BMI 27.87 kg/m2  SpO2 95% Wt Readings from Last 3 Encounters:  05/29/15 178 lb (80.74 kg)  05/24/15 175 lb (79.379 kg)  05/21/15 176 lb (79.833 kg)     Lab Results  Component Value Date   WBC 6.6 05/29/2015   HGB 11.7* 05/29/2015   HCT 34.8* 05/29/2015   PLT 299 05/29/2015   GLUCOSE 90 05/29/2015   CHOL 185 05/15/2015   TRIG 441.0* 05/15/2015   HDL 28.10* 05/15/2015   LDLDIRECT 57.0 05/15/2015   LDLCALC 60 04/24/2013   ALT 16* 05/24/2015   AST 19 05/24/2015   NA 142 05/29/2015   K 4.7 05/29/2015   CL 106 05/29/2015   CREATININE 1.25 05/29/2015   BUN 19 05/29/2015   CO2 24 05/29/2015   TSH 2.52 12/11/2014   PSA 1.08 12/11/2014   INR 1.09 05/24/2015   HGBA1C 5.6 06/18/2013    Ct Angio Chest Pe W/cm &/or Wo Cm  05/24/2015  CLINICAL DATA:  Left-sided chest pain.  Left arm pain.  Smoker. EXAM:  CT ANGIOGRAPHY CHEST WITH CONTRAST TECHNIQUE: Multidetector CT imaging of the chest was performed using the standard protocol during bolus administration of intravenous contrast. Multiplanar CT image reconstructions and MIPs were obtained to evaluate the vascular anatomy. CONTRAST:  60 cc of Isovue 370 COMPARISON:  None. FINDINGS: Mediastinum/Lymph Nodes: No pulmonary emboli identified. No masses or pathologically enlarged lymph nodes identified. Lungs/Pleura: Moderate changes of upper lobe predominant paraseptal emphysema. No airspace consolidation, interstitial edema or pleural effusion identified. Dependent changes are noted within the posterior lung bases. Upper abdomen: The adrenal glands are normal. No focal  liver abnormality identified. The gallbladder is normal. Normal appearance of the pancreas and spleen. Musculoskeletal: No chest wall mass or suspicious bone lesions identified. Spondylosis identified within the thoracic spine. Review of the MIP images confirms the above findings. IMPRESSION: 1. No evidence for acute pulmonary embolus. 2. Emphysema. Electronically Signed   By: Kerby Moors [name]   On: 05/24/2015 18:36   Dg Chest Portable 1 View  05/24/2015  CLINICAL DATA:  Chest pain. EXAM: PORTABLE CHEST 1 VIEW COMPARISON:  None. FINDINGS: The heart size and mediastinal contours are within normal limits. Both lungs are clear. No pneumothorax or pleural effusion is noted. The visualized skeletal structures are unremarkable. IMPRESSION: No acute cardiopulmonary abnormality seen. Electronically Signed   By: Marijo Conception, [name]   On: 05/24/2015 17:20       Assessment & Plan:   Problem List Items Addressed This Visit    Absent pulse in lower extremity    S/p stents as outlined.  Followed by vascular surgery.  Spoke with Dr Lucky Cowboy.  Discussed presentation and bruits.  Felt no further w/up warranted at this time.  Follow.  Given decreased hgb and increased cr, recheck met b and cbc today.         Essential hypertension, benign    Blood pressure doing well back on his medications.  Follow pressures.  Follow metabolic panel.  Cr increased.  Recheck today.        Pure hypercholesterolemia    On lipitor.  Low cholesterol diet.       Tobacco abuse    Has stopped smoking.  Follow.        Other Visit Diagnoses    Elevated creatine kinase    -  Primary    Relevant Orders    CBC with Differential/Platelet (Completed)    Basic Metabolic Panel (BMET) (Completed)    Anemia, unspecified anemia type        Relevant Orders    CBC with Differential/Platelet (Completed)    Basic Metabolic Panel (BMET) (Completed)        [name], MD

## 2015-05-30 ENCOUNTER — Other Ambulatory Visit: Payer: Self-pay | Admitting: Internal Medicine

## 2015-05-30 ENCOUNTER — Encounter: Payer: Self-pay | Admitting: Internal Medicine

## 2015-05-30 DIAGNOSIS — D649 Anemia, unspecified: Secondary | ICD-10-CM

## 2015-05-30 NOTE — Progress Notes (Signed)
Order placed for f/u labs.  

## 2015-05-31 ENCOUNTER — Encounter: Payer: Self-pay | Admitting: Internal Medicine

## 2015-05-31 NOTE — Assessment & Plan Note (Signed)
Has stopped smoking.  Follow.

## 2015-05-31 NOTE — Assessment & Plan Note (Signed)
Blood pressure doing well back on his medications.  Follow pressures.  Follow metabolic panel.  Cr increased.  Recheck today.

## 2015-05-31 NOTE — Assessment & Plan Note (Signed)
On lipitor.  Low cholesterol diet.

## 2015-05-31 NOTE — Assessment & Plan Note (Signed)
S/p stents as outlined.  Followed by vascular surgery.  Spoke with Dr Lucky Cowboy.  Discussed presentation and bruits.  Felt no further w/up warranted at this time.  Follow.  Given decreased hgb and increased cr, recheck met b and cbc today.

## 2015-06-01 ENCOUNTER — Telehealth: Payer: Self-pay | Admitting: *Deleted

## 2015-06-01 NOTE — Telephone Encounter (Signed)
Upon review of patient for lung cancer screening scan, noted CT angio done on 05/24/15. Notified patient that screening scan not needed at this time. Will follow for screening opportunity in future.

## 2015-06-01 NOTE — Telephone Encounter (Signed)
I had sent you a staff message about this pts labs.  He has not read his my chart message and needs f/u labs this week.  Thanks.

## 2015-06-02 NOTE — Telephone Encounter (Signed)
Pt notified of labs & coming on Friday 5/12 to recheck hgb & iron

## 2015-06-05 ENCOUNTER — Other Ambulatory Visit (INDEPENDENT_AMBULATORY_CARE_PROVIDER_SITE_OTHER): Payer: BLUE CROSS/BLUE SHIELD

## 2015-06-05 DIAGNOSIS — D649 Anemia, unspecified: Secondary | ICD-10-CM

## 2015-06-05 LAB — CBC WITH DIFFERENTIAL/PLATELET
BASOS ABS: 0 10*3/uL (ref 0.0–0.1)
Basophils Relative: 0.7 % (ref 0.0–3.0)
EOS ABS: 0.3 10*3/uL (ref 0.0–0.7)
EOS PCT: 4.9 % (ref 0.0–5.0)
HCT: 37 % — ABNORMAL LOW (ref 39.0–52.0)
HEMOGLOBIN: 12.8 g/dL — AB (ref 13.0–17.0)
Lymphocytes Relative: 41.1 % (ref 12.0–46.0)
Lymphs Abs: 2.4 10*3/uL (ref 0.7–4.0)
MCHC: 34.5 g/dL (ref 30.0–36.0)
MCV: 90.7 fl (ref 78.0–100.0)
MONO ABS: 0.5 10*3/uL (ref 0.1–1.0)
Monocytes Relative: 8.9 % (ref 3.0–12.0)
NEUTROS PCT: 44.4 % (ref 43.0–77.0)
Neutro Abs: 2.6 10*3/uL (ref 1.4–7.7)
Platelets: 314 10*3/uL (ref 150.0–400.0)
RBC: 4.08 Mil/uL — AB (ref 4.22–5.81)
RDW: 13.1 % (ref 11.5–15.5)
WBC: 5.8 10*3/uL (ref 4.0–10.5)

## 2015-06-05 LAB — VITAMIN B12: Vitamin B-12: 307 pg/mL (ref 211–911)

## 2015-06-05 LAB — FERRITIN: FERRITIN: 187.2 ng/mL (ref 22.0–322.0)

## 2015-06-05 LAB — IBC PANEL
IRON: 100 ug/dL (ref 42–165)
Saturation Ratios: 27.5 % (ref 20.0–50.0)
TRANSFERRIN: 260 mg/dL (ref 212.0–360.0)

## 2015-07-01 ENCOUNTER — Other Ambulatory Visit: Payer: Self-pay | Admitting: Internal Medicine

## 2015-07-09 ENCOUNTER — Other Ambulatory Visit (INDEPENDENT_AMBULATORY_CARE_PROVIDER_SITE_OTHER): Payer: BLUE CROSS/BLUE SHIELD

## 2015-07-09 ENCOUNTER — Telehealth: Payer: Self-pay | Admitting: *Deleted

## 2015-07-09 DIAGNOSIS — D649 Anemia, unspecified: Secondary | ICD-10-CM

## 2015-07-09 LAB — CBC WITH DIFFERENTIAL/PLATELET
BASOS ABS: 0 10*3/uL (ref 0.0–0.1)
BASOS PCT: 0.8 % (ref 0.0–3.0)
EOS ABS: 0.2 10*3/uL (ref 0.0–0.7)
Eosinophils Relative: 3.1 % (ref 0.0–5.0)
HEMATOCRIT: 36.3 % — AB (ref 39.0–52.0)
Hemoglobin: 12.6 g/dL — ABNORMAL LOW (ref 13.0–17.0)
LYMPHS PCT: 42.1 % (ref 12.0–46.0)
Lymphs Abs: 2.3 10*3/uL (ref 0.7–4.0)
MCHC: 34.6 g/dL (ref 30.0–36.0)
MCV: 92.2 fl (ref 78.0–100.0)
MONO ABS: 0.5 10*3/uL (ref 0.1–1.0)
Monocytes Relative: 9.3 % (ref 3.0–12.0)
NEUTROS ABS: 2.4 10*3/uL (ref 1.4–7.7)
Neutrophils Relative %: 44.7 % (ref 43.0–77.0)
Platelets: 260 10*3/uL (ref 150.0–400.0)
RBC: 3.94 Mil/uL — ABNORMAL LOW (ref 4.22–5.81)
RDW: 13.2 % (ref 11.5–15.5)
WBC: 5.4 10*3/uL (ref 4.0–10.5)

## 2015-07-09 LAB — FERRITIN: FERRITIN: 170.6 ng/mL (ref 22.0–322.0)

## 2015-07-09 NOTE — Telephone Encounter (Signed)
Labs and dx?  

## 2015-07-09 NOTE — Telephone Encounter (Signed)
Lab order placed.

## 2015-07-10 ENCOUNTER — Encounter: Payer: Self-pay | Admitting: *Deleted

## 2015-07-10 ENCOUNTER — Other Ambulatory Visit: Payer: Self-pay | Admitting: Internal Medicine

## 2015-07-10 DIAGNOSIS — E7800 Pure hypercholesterolemia, unspecified: Secondary | ICD-10-CM

## 2015-07-10 DIAGNOSIS — D649 Anemia, unspecified: Secondary | ICD-10-CM

## 2015-07-10 DIAGNOSIS — I10 Essential (primary) hypertension: Secondary | ICD-10-CM

## 2015-07-10 NOTE — Progress Notes (Signed)
Order placed for fasting labs.   

## 2015-07-31 ENCOUNTER — Other Ambulatory Visit: Payer: Self-pay | Admitting: Internal Medicine

## 2015-08-18 ENCOUNTER — Encounter: Payer: Self-pay | Admitting: *Deleted

## 2015-08-31 ENCOUNTER — Encounter: Payer: Self-pay | Admitting: Internal Medicine

## 2015-08-31 ENCOUNTER — Ambulatory Visit (INDEPENDENT_AMBULATORY_CARE_PROVIDER_SITE_OTHER): Payer: 59 | Admitting: Internal Medicine

## 2015-08-31 DIAGNOSIS — I10 Essential (primary) hypertension: Secondary | ICD-10-CM

## 2015-08-31 DIAGNOSIS — I739 Peripheral vascular disease, unspecified: Secondary | ICD-10-CM

## 2015-08-31 DIAGNOSIS — M5442 Lumbago with sciatica, left side: Secondary | ICD-10-CM

## 2015-08-31 DIAGNOSIS — E7800 Pure hypercholesterolemia, unspecified: Secondary | ICD-10-CM | POA: Diagnosis not present

## 2015-08-31 NOTE — Progress Notes (Signed)
Patient ID: Travis Gray, male   DOB: Jan 24, 1956, 60 y.o.   MRN: [id number]   Subjective:    Patient ID: Travis Gray, male    DOB: 1955/12/24, 60 y.o.   MRN: [id number]  HPI  Patient here for a scheduled follow up.  Is doing well.  Feels good.  Stays active.  No chest pain.  No sob.  Has quit smoking.  No acid reflux.  No abdominal pain or cramping.  Bowels stable.  Leg doing well.  No pain.  Overall feels good.    Past Medical History:  Diagnosis Date  . Chronic headaches   . Degenerative disc disease, cervical   . Hypercholesterolemia   . Hypertension   . Peripheral vascular disease ([place])   . Tobacco abuse    Past Surgical History:  Procedure Laterality Date  . HERNIA REPAIR     Inguinal,laparoscopic surgery  . PERIPHERAL VASCULAR CATHETERIZATION Right 05/21/2015   Procedure: Lower Extremity Angiography;  Surgeon: Algernon Huxley, MD;  Location: [place] [facility];  Service: Cardiovascular;  Laterality: Right;  . PERIPHERAL VASCULAR CATHETERIZATION  05/21/2015   Procedure: Lower Extremity Intervention;  Surgeon: Algernon Huxley, MD;  Location: [place] [facility];  Service: Cardiovascular;;  . radio-ablated therapy     completed in the pain clinic   Family History  Problem Relation Age of Onset  . Hypertension Mother   . Hyperlipidemia Father   . Cancer Paternal Grandfather   . Diabetes Maternal Grandmother    Social History   Social History  . Marital status: Married    Spouse name: N/A  . Number of children: N/A  . Years of education: N/A   Social History Main Topics  . Smoking status: Former Smoker    Packs/day: 1.00    Years: 36.00    Types: Cigarettes  . Smokeless tobacco: Never Used  . Alcohol use 0.0 oz/week     Comment: very occasional alcohol use  . Drug use: No  . Sexual activity: Not Asked   Other Topics Concern  . None   Social History Narrative  . None    Outpatient Encounter Prescriptions as of 08/31/2015  Medication Sig  . amitriptyline  (ELAVIL) 50 MG tablet TAKE 1 TABLET BY MOUTH AT BEDTIME  . atenolol (TENORMIN) 50 MG tablet TAKE 1 TABLET BY MOUTH ONCE DAILY  . atorvastatin (LIPITOR) 10 MG tablet Take 1 tablet (10 mg total) by mouth daily.  . clopidogrel (PLAVIX) 75 MG tablet Take 1 tablet (75 mg total) by mouth daily.  Marland Kitchen losartan-hydrochlorothiazide (HYZAAR) 100-25 MG tablet Take 1 tablet by mouth daily.  Marland Kitchen omega-3 acid ethyl esters (LOVAZA) 1 g capsule Take 1 capsule (1 g total) by mouth daily.  Marland Kitchen omeprazole (PRILOSEC) 40 MG capsule Take 1 capsule (40 mg total) by mouth daily.  . [DISCONTINUED] albuterol (PROVENTIL HFA;VENTOLIN HFA) 108 (90 BASE) MCG/ACT inhaler Inhale 2 puffs into the lungs every 6 (six) hours as needed for wheezing or shortness of breath.  . [DISCONTINUED] gabapentin (NEURONTIN) 300 MG capsule Take 300 mg by mouth daily.  . [DISCONTINUED] meloxicam (MOBIC) 7.5 MG tablet Take 7.5 mg by mouth daily.   No facility-administered encounter medications on file as of 08/31/2015.     Review of Systems  Constitutional: Negative for appetite change and unexpected weight change.  HENT: Negative for congestion and sinus pressure.   Respiratory: Negative for cough, chest tightness and shortness of breath.   Cardiovascular: Negative for chest pain, palpitations and  leg swelling.  Gastrointestinal: Negative for abdominal pain, diarrhea, nausea and vomiting.  Musculoskeletal:       Leg pain better.  No significant back pain.   Skin:       S/p multiple lesions removed - saw dermatology.   Neurological: Negative for dizziness, light-headedness and headaches.  Psychiatric/Behavioral: Negative for agitation and dysphoric mood.       Objective:     Blood pressure rechecked by me:  128/82  Physical Exam  Constitutional: He appears well-developed and well-nourished. No distress.  HENT:  Nose: Nose normal.  Mouth/Throat: Oropharynx is clear and moist.  Neck: Neck supple. No thyromegaly present.  Cardiovascular:  Normal rate and regular rhythm.   Pulmonary/Chest: Effort normal and breath sounds normal. No respiratory distress.  Abdominal: Soft. Bowel sounds are normal. There is no tenderness.  Musculoskeletal: He exhibits no edema or tenderness.  Lymphadenopathy:    He has no cervical adenopathy.  Skin: No rash noted. No erythema.  Psychiatric: He has a normal mood and affect. His behavior is normal.    BP 128/76   Pulse 60   Temp 98.3 F (36.8 C)   Resp 16   Wt 179 lb (81.2 kg)   BMI 28.04 kg/m  Wt Readings from Last 3 Encounters:  08/31/15 179 lb (81.2 kg)  05/29/15 178 lb (80.7 kg)  05/24/15 175 lb (79.4 kg)     Lab Results  Component Value Date   WBC 5.4 07/09/2015   HGB 12.6 (L) 07/09/2015   HCT 36.3 (L) 07/09/2015   PLT 260.0 07/09/2015   GLUCOSE 90 05/29/2015   CHOL 185 05/15/2015   TRIG 441.0 (H) 05/15/2015   HDL 28.10 (L) 05/15/2015   LDLDIRECT 57.0 05/15/2015   LDLCALC 60 04/24/2013   ALT 16 (L) 05/24/2015   AST 19 05/24/2015   NA 142 05/29/2015   K 4.7 05/29/2015   CL 106 05/29/2015   CREATININE 1.25 05/29/2015   BUN 19 05/29/2015   CO2 24 05/29/2015   TSH 2.52 12/11/2014   PSA 1.08 12/11/2014   INR 1.09 05/24/2015   HGBA1C 5.6 06/18/2013    Ct Angio Chest Pe W/cm &/or Wo Cm  Result Date: 05/24/2015 CLINICAL DATA:  Left-sided chest pain.  Left arm pain.  Smoker. EXAM: CT ANGIOGRAPHY CHEST WITH CONTRAST TECHNIQUE: Multidetector CT imaging of the chest was performed using the standard protocol during bolus administration of intravenous contrast. Multiplanar CT image reconstructions and MIPs were obtained to evaluate the vascular anatomy. CONTRAST:  60 cc of Isovue 370 COMPARISON:  None. FINDINGS: Mediastinum/Lymph Nodes: No pulmonary emboli identified. No masses or pathologically enlarged lymph nodes identified. Lungs/Pleura: Moderate changes of upper lobe predominant paraseptal emphysema. No airspace consolidation, interstitial edema or pleural effusion  identified. Dependent changes are noted within the posterior lung bases. Upper abdomen: The adrenal glands are normal. No focal liver abnormality identified. The gallbladder is normal. Normal appearance of the pancreas and spleen. Musculoskeletal: No chest wall mass or suspicious bone lesions identified. Spondylosis identified within the thoracic spine. Review of the MIP images confirms the above findings. IMPRESSION: 1. No evidence for acute pulmonary embolus. 2. Emphysema. Electronically Signed   By: Kerby Moors [name]   On: 05/24/2015 18:36   Dg Chest Portable 1 View  Result Date: 05/24/2015 CLINICAL DATA:  Chest pain. EXAM: PORTABLE CHEST 1 VIEW COMPARISON:  None. FINDINGS: The heart size and mediastinal contours are within normal limits. Both lungs are clear. No pneumothorax or pleural effusion is noted. The visualized  skeletal structures are unremarkable. IMPRESSION: No acute cardiopulmonary abnormality seen. Electronically Signed   By: Marijo Conception, [name]   On: 05/24/2015 17:20       Assessment & Plan:   Problem List Items Addressed This Visit    Back pain    Not a significant issue for him today.  Stays active.  Follow.       Essential hypertension, benign    Blood pressure under good control.  Continue same medication regimen.  Follow pressures.  Follow metabolic panel.        Peripheral vascular disease ([place])    S/p stents.  Followed by vascular surgery.  Has f/u in 10/2015.  Doing well.  No pain.  Not smoking.  Treat cholesterol.        Pure hypercholesterolemia    On lipitor.  Low cholesterol diet and exercise.  Follow lipid panel and liver function tests.         Other Visit Diagnoses   None.      [name], MD

## 2015-08-31 NOTE — Assessment & Plan Note (Signed)
S/p stents.  Followed by vascular surgery.  Has f/u in 10/2015.  Doing well.  No pain.  Not smoking.  Treat cholesterol.

## 2015-08-31 NOTE — Assessment & Plan Note (Signed)
Not a significant issue for him today.  Stays active.  Follow.

## 2015-08-31 NOTE — Assessment & Plan Note (Signed)
On lipitor.  Low cholesterol diet and exercise.  Follow lipid panel and liver function tests.   

## 2015-08-31 NOTE — Assessment & Plan Note (Signed)
Blood pressure under good control.  Continue same medication regimen.  Follow pressures.  Follow metabolic panel.   

## 2015-08-31 NOTE — Progress Notes (Signed)
Pre visit review using our clinic review tool, if applicable. No additional management support is needed unless otherwise documented below in the visit note. 

## 2015-09-15 ENCOUNTER — Other Ambulatory Visit (INDEPENDENT_AMBULATORY_CARE_PROVIDER_SITE_OTHER): Payer: 59

## 2015-09-15 DIAGNOSIS — E7800 Pure hypercholesterolemia, unspecified: Secondary | ICD-10-CM

## 2015-09-15 DIAGNOSIS — I10 Essential (primary) hypertension: Secondary | ICD-10-CM | POA: Diagnosis not present

## 2015-09-15 DIAGNOSIS — D649 Anemia, unspecified: Secondary | ICD-10-CM

## 2015-09-15 LAB — BASIC METABOLIC PANEL
BUN: 17 mg/dL (ref 6–23)
CO2: 30 mEq/L (ref 19–32)
Calcium: 9 mg/dL (ref 8.4–10.5)
Chloride: 103 mEq/L (ref 96–112)
Creatinine, Ser: 1.07 mg/dL (ref 0.40–1.50)
GFR: 75.35 mL/min (ref >60.00)
Glucose, Bld: 106 mg/dL — ABNORMAL HIGH (ref 70–99)
POTASSIUM: 4.4 meq/L (ref 3.5–5.1)
SODIUM: 140 meq/L (ref 135–145)

## 2015-09-15 LAB — CBC WITH DIFFERENTIAL/PLATELET
BASOS ABS: 0 10*3/uL (ref 0.0–0.1)
Basophils Relative: 0.7 % (ref 0.0–3.0)
EOS ABS: 0.1 10*3/uL (ref 0.0–0.7)
Eosinophils Relative: 3.2 % (ref 0.0–5.0)
HEMATOCRIT: 38.5 % — AB (ref 39.0–52.0)
HEMOGLOBIN: 13.5 g/dL (ref 13.0–17.0)
LYMPHS PCT: 39.8 % (ref 12.0–46.0)
Lymphs Abs: 1.8 10*3/uL (ref 0.7–4.0)
MCHC: 35.1 g/dL (ref 30.0–36.0)
MCV: 91.9 fl (ref 78.0–100.0)
Monocytes Absolute: 0.5 10*3/uL (ref 0.1–1.0)
Monocytes Relative: 10.4 % (ref 3.0–12.0)
Neutro Abs: 2 10*3/uL (ref 1.4–7.7)
Neutrophils Relative %: 45.9 % (ref 43.0–77.0)
Platelets: 198 10*3/uL (ref 150.0–400.0)
RBC: 4.19 Mil/uL — AB (ref 4.22–5.81)
RDW: 13.5 % (ref 11.5–15.5)
WBC: 4.5 10*3/uL (ref 4.0–10.5)

## 2015-09-15 LAB — FERRITIN: Ferritin: 145.6 ng/mL (ref 22.0–322.0)

## 2015-09-15 LAB — LDL CHOLESTEROL, DIRECT: LDL DIRECT: 55 mg/dL

## 2015-09-15 LAB — LIPID PANEL
CHOLESTEROL: 198 mg/dL (ref 0–200)
HDL: 24.4 mg/dL — AB (ref >39.00)
Total CHOL/HDL Ratio: 8

## 2015-09-15 LAB — HEPATIC FUNCTION PANEL
ALT: 15 U/L (ref 0–53)
AST: 15 U/L (ref 0–37)
Albumin: 4.3 g/dL (ref 3.5–5.2)
Alkaline Phosphatase: 58 U/L (ref 39–117)
Bilirubin, Direct: 0.1 mg/dL (ref 0.0–0.3)
TOTAL PROTEIN: 7 g/dL (ref 6.0–8.3)
Total Bilirubin: 0.4 mg/dL (ref 0.2–1.2)

## 2015-09-15 LAB — IBC PANEL
IRON: 78 ug/dL (ref 42–165)
SATURATION RATIOS: 21.4 % (ref 20.0–50.0)
TRANSFERRIN: 260 mg/dL (ref 212.0–360.0)

## 2015-09-16 ENCOUNTER — Encounter: Payer: Self-pay | Admitting: *Deleted

## 2015-10-13 ENCOUNTER — Telehealth: Payer: Self-pay

## 2015-10-13 NOTE — Telephone Encounter (Signed)
PA started on Cover my meds for Lovaza

## 2015-10-14 ENCOUNTER — Encounter: Payer: Self-pay | Admitting: Internal Medicine

## 2015-10-14 MED ORDER — OMEGA-3-ACID ETHYL ESTERS 1 G PO CAPS: 1.0000 g | ORAL_CAPSULE | Freq: Every day | ORAL | 3 refills | Status: DC

## 2015-10-14 NOTE — Telephone Encounter (Signed)
rx sent in for lovaza.

## 2015-11-02 ENCOUNTER — Telehealth: Payer: Self-pay | Admitting: Internal Medicine

## 2015-11-02 ENCOUNTER — Encounter: Payer: Self-pay | Admitting: Internal Medicine

## 2015-11-02 ENCOUNTER — Other Ambulatory Visit: Payer: Self-pay | Admitting: Internal Medicine

## 2015-11-02 NOTE — Telephone Encounter (Signed)
Pharamcy called and stated that they have a long term back order on atenolol (TENORMIN) 50 MG tablet. Is there something else that you could prescribe?    Call pharmacy [phone]

## 2015-11-02 NOTE — Telephone Encounter (Signed)
This is nationwide for all atenolol

## 2015-11-02 NOTE — Telephone Encounter (Signed)
My chart message sent to pt about changing atenolol to toprol.

## 2015-11-02 NOTE — Telephone Encounter (Signed)
Is the pharmacy able to get any other dose of atenolol (i.e., 100mg  - for him to take 1/2 tablet q day).  Also, is this multiple pharmacies that are having this problem?

## 2015-11-02 NOTE — Telephone Encounter (Signed)
Please advise 

## 2015-11-03 ENCOUNTER — Other Ambulatory Visit: Payer: Self-pay | Admitting: Internal Medicine

## 2015-11-03 MED ORDER — METOPROLOL SUCCINATE ER 50 MG PO TB24: 50.0000 mg | ORAL_TABLET | Freq: Every day | ORAL | 2 refills | Status: DC

## 2015-11-03 NOTE — Progress Notes (Signed)
rx sent in for toprol xl 50mg  q day #30 with 2 refills.

## 2015-11-30 ENCOUNTER — Other Ambulatory Visit: Payer: Self-pay | Admitting: Internal Medicine

## 2016-01-02 ENCOUNTER — Other Ambulatory Visit: Payer: Self-pay | Admitting: Internal Medicine

## 2016-01-04 ENCOUNTER — Ambulatory Visit: Payer: 59 | Admitting: Internal Medicine

## 2016-01-11 ENCOUNTER — Encounter: Payer: Self-pay | Admitting: Internal Medicine

## 2016-01-21 ENCOUNTER — Encounter: Payer: Self-pay | Admitting: Internal Medicine

## 2016-01-21 ENCOUNTER — Ambulatory Visit (INDEPENDENT_AMBULATORY_CARE_PROVIDER_SITE_OTHER): Payer: 59 | Admitting: Internal Medicine

## 2016-01-21 VITALS — BP 114/68 | HR 79 | Temp 97.4°F | Ht 67.0 in | Wt 180.6 lb

## 2016-01-21 DIAGNOSIS — I739 Peripheral vascular disease, unspecified: Secondary | ICD-10-CM | POA: Diagnosis not present

## 2016-01-21 DIAGNOSIS — D649 Anemia, unspecified: Secondary | ICD-10-CM | POA: Insufficient documentation

## 2016-01-21 DIAGNOSIS — R51 Headache: Secondary | ICD-10-CM

## 2016-01-21 DIAGNOSIS — Z125 Encounter for screening for malignant neoplasm of prostate: Secondary | ICD-10-CM | POA: Diagnosis not present

## 2016-01-21 DIAGNOSIS — R519 Headache, unspecified: Secondary | ICD-10-CM

## 2016-01-21 DIAGNOSIS — E7800 Pure hypercholesterolemia, unspecified: Secondary | ICD-10-CM

## 2016-01-21 DIAGNOSIS — I10 Essential (primary) hypertension: Secondary | ICD-10-CM

## 2016-01-21 DIAGNOSIS — M25511 Pain in right shoulder: Secondary | ICD-10-CM

## 2016-01-21 MED ORDER — METOPROLOL SUCCINATE ER 50 MG PO TB24: 50.0000 mg | ORAL_TABLET | Freq: Every day | ORAL | 5 refills | Status: DC

## 2016-01-21 NOTE — Assessment & Plan Note (Signed)
Previous pain in his shoulder.  Noticed more with adduction.  Better now.  Follow.  Will notify me if reoccurrence.

## 2016-01-21 NOTE — Progress Notes (Signed)
Pre visit review using our clinic review tool, if applicable. No additional management support is needed unless otherwise documented below in the visit note. 

## 2016-01-21 NOTE — Assessment & Plan Note (Signed)
Blood pressure under good control.  Continue same medication regimen.  Follow pressures.  Follow metabolic panel.   

## 2016-01-21 NOTE — Assessment & Plan Note (Signed)
Low cholesterol diet and exercise.  On lipitor.  Follow lipid panel and liver function tests.   

## 2016-01-21 NOTE — Assessment & Plan Note (Signed)
Not an issue now.  Follow.    

## 2016-01-21 NOTE — Progress Notes (Signed)
Patient ID: Travis Gray, male   DOB: 07-Jul-1955, 60 y.o.   MRN: [id number]   Subjective:    Patient ID: Travis Gray, male    DOB: 1955/08/23, 60 y.o.   MRN: [id number]  HPI  Patient here for a scheduled follow up.  States he is doing well.  Feels good.  Stays active.  No chest pain.  No sob.  No acid reflux.  No abdominal pain or cramping.  Leg doing well since his procedure.  Has f/u with vascular surgery after first of the year.  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  Clear margins.  Continues to f/u with dermatology.  Overall feels good.  Had cough recently.  Is better. Some residual, but much improved.     Past Medical History:  Diagnosis Date  . Chronic headaches   . Degenerative disc disease, cervical   . Hypercholesterolemia   . Hypertension   . Peripheral vascular disease ([place])   . Tobacco abuse    Past Surgical History:  Procedure Laterality Date  . HERNIA REPAIR     Inguinal,laparoscopic surgery  . PERIPHERAL VASCULAR CATHETERIZATION Right 05/21/2015   Procedure: Lower Extremity Angiography;  Surgeon: Algernon Huxley, MD;  Location: [facility];  Service: Cardiovascular;  Laterality: Right;  . PERIPHERAL VASCULAR CATHETERIZATION  05/21/2015   Procedure: Lower Extremity Intervention;  Surgeon: Algernon Huxley, MD;  Location: [facility];  Service: Cardiovascular;;  . radio-ablated therapy     completed in the pain clinic   Family History  Problem Relation Age of Onset  . Hypertension Mother   . Hyperlipidemia Father   . Cancer Paternal Grandfather   . Diabetes Maternal Grandmother    Social History   Social History  . Marital status: Married    Spouse name: N/A  . Number of children: N/A  . Years of education: N/A   Social History Main Topics  . Smoking status: Former Smoker    Packs/day: 1.00    Years: 36.00    Types: Cigarettes  . Smokeless tobacco: Never Used  . Alcohol use 0.0 oz/week     Comment: very occasional alcohol  use  . Drug use: No  . Sexual activity: Not Asked   Other Topics Concern  . None   Social History Narrative  . None    Outpatient Encounter Prescriptions as of 01/21/2016  Medication Sig  . amitriptyline (ELAVIL) 50 MG tablet TAKE 1 TABLET BY MOUTH AT BEDTIME  . atorvastatin (LIPITOR) 10 MG tablet Take 1 tablet (10 mg total) by mouth daily.  . clopidogrel (PLAVIX) 75 MG tablet Take 1 tablet (75 mg total) by mouth daily.  Marland Kitchen losartan-hydrochlorothiazide (HYZAAR) 100-25 MG tablet Take 1 tablet by mouth daily.  . metoprolol succinate (TOPROL-XL) 50 MG 24 hr tablet Take 1 tablet (50 mg total) by mouth daily. Take with or immediately following a meal.  . omega-3 acid ethyl esters (LOVAZA) 1 g capsule Take 1 capsule (1 g total) by mouth daily.  Marland Kitchen omeprazole (PRILOSEC) 40 MG capsule TAKE 1 CAPSULE BY MOUTH ONCE DAILY.  . [DISCONTINUED] metoprolol succinate (TOPROL-XL) 50 MG 24 hr tablet TAKE 1 TABLET BY MOUTH ONCE DAILY   No facility-administered encounter medications on file as of 01/21/2016.     Review of Systems  Constitutional: Negative for appetite change and unexpected weight change.  HENT: Negative for congestion and sinus pressure.   Respiratory: Positive for cough. Negative for chest tightness and shortness of  breath.   Cardiovascular: Negative for chest pain, palpitations and leg swelling.  Gastrointestinal: Negative for abdominal pain, diarrhea, nausea and vomiting.  Genitourinary: Negative for difficulty urinating and dysuria.  Musculoskeletal: Negative for back pain and joint swelling.  Skin: Negative for color change and rash.  Neurological: Negative for dizziness, light-headedness and headaches.  Psychiatric/Behavioral: Negative for agitation and dysphoric mood.       Objective:     Blood pressure rechecked by me:  120/82  Physical Exam  Constitutional: He appears well-developed and well-nourished. No distress.  HENT:  Nose: Nose normal.  Mouth/Throat: Oropharynx  is clear and moist.  Neck: Neck supple. No thyromegaly present.  Cardiovascular: Normal rate and regular rhythm.   Pulmonary/Chest: Effort normal and breath sounds normal. No respiratory distress.  Abdominal: Soft. Bowel sounds are normal. There is no tenderness.  Musculoskeletal: He exhibits no edema or tenderness.  Lymphadenopathy:    He has no cervical adenopathy.  Skin: No rash noted. No erythema.  Psychiatric: He has a normal mood and affect. His behavior is normal.    BP 114/68   Pulse 79   Temp 97.4 F (36.3 C) (Oral)   Ht 5\' 7"  (1.702 m)   Wt 180 lb 9.6 oz (81.9 kg)   SpO2 98%   BMI 28.29 kg/m  Wt Readings from Last 3 Encounters:  01/21/16 180 lb 9.6 oz (81.9 kg)  08/31/15 179 lb (81.2 kg)  05/29/15 178 lb (80.7 kg)     Lab Results  Component Value Date   WBC 4.5 09/15/2015   HGB 13.5 09/15/2015   HCT 38.5 (L) 09/15/2015   PLT 198.0 09/15/2015   GLUCOSE 106 (H) 09/15/2015   CHOL 198 09/15/2015   TRIG (H) 09/15/2015    648.0 Triglyceride is over 400; calculations on Lipids are invalid.   HDL 24.40 (L) 09/15/2015   LDLDIRECT 55.0 09/15/2015   LDLCALC 60 04/24/2013   ALT 15 09/15/2015   AST 15 09/15/2015   NA 140 09/15/2015   K 4.4 09/15/2015   CL 103 09/15/2015   CREATININE 1.07 09/15/2015   BUN 17 09/15/2015   CO2 30 09/15/2015   TSH 2.52 12/11/2014   PSA 1.08 12/11/2014   INR 1.09 05/24/2015   HGBA1C 5.6 06/18/2013    Ct Angio Chest Pe W/cm &/or Wo Cm  Result Date: 05/24/2015 CLINICAL DATA:  Left-sided chest pain.  Left arm pain.  Smoker. EXAM: CT ANGIOGRAPHY CHEST WITH CONTRAST TECHNIQUE: Multidetector CT imaging of the chest was performed using the standard protocol during bolus administration of intravenous contrast. Multiplanar CT image reconstructions and MIPs were obtained to evaluate the vascular anatomy. CONTRAST:  60 cc of Isovue 370 COMPARISON:  None. FINDINGS: Mediastinum/Lymph Nodes: No pulmonary emboli identified. No masses or  pathologically enlarged lymph nodes identified. Lungs/Pleura: Moderate changes of upper lobe predominant paraseptal emphysema. No airspace consolidation, interstitial edema or pleural effusion identified. Dependent changes are noted within the posterior lung bases. Upper abdomen: The adrenal glands are normal. No focal liver abnormality identified. The gallbladder is normal. Normal appearance of the pancreas and spleen. Musculoskeletal: No chest wall mass or suspicious bone lesions identified. Spondylosis identified within the thoracic spine. Review of the MIP images confirms the above findings. IMPRESSION: 1. No evidence for acute pulmonary embolus. 2. Emphysema. Electronically Signed   By: Kerby Moors [name]   On: 05/24/2015 18:36   Dg Chest Portable 1 View  Result Date: 05/24/2015 CLINICAL DATA:  Chest pain. EXAM: PORTABLE CHEST 1 VIEW COMPARISON:  None. FINDINGS: The heart size and mediastinal contours are within normal limits. Both lungs are clear. No pneumothorax or pleural effusion is noted. The visualized skeletal structures are unremarkable. IMPRESSION: No acute cardiopulmonary abnormality seen. Electronically Signed   By: Marijo Conception, [name]   On: 05/24/2015 17:20       Assessment & Plan:   Problem List Items Addressed This Visit    Anemia    Previous slightly decreased hgb.  B12 low normal range.  On oral B12.  Recheck cbc and B12 with next labs.        Relevant Orders   CBC with Differential/Platelet   Ferritin   Vitamin B12   Essential hypertension, benign    Blood pressure under good control.  Continue same medication regimen.  Follow pressures.  Follow metabolic panel.        Relevant Medications   metoprolol succinate (TOPROL-XL) 50 MG 24 hr tablet   Other Relevant Orders   Basic metabolic panel   Headache    Not an issue now.  Follow.       Relevant Medications   metoprolol succinate (TOPROL-XL) 50 MG 24 hr tablet   Peripheral vascular disease ([facility])    S/p stents  of lower extremity.  Followed by vascular surgery.  Doing well.  Has f/u planned after first of the year.  Not smoking.  Treating cholesterol.        Relevant Medications   metoprolol succinate (TOPROL-XL) 50 MG 24 hr tablet   Pure hypercholesterolemia    Low cholesterol diet and exercise.  On lipitor.  Follow lipid panel and liver function tests.        Relevant Medications   metoprolol succinate (TOPROL-XL) 50 MG 24 hr tablet   Other Relevant Orders   Hepatic function panel   Lipid panel   Right shoulder pain    Previous pain in his shoulder.  Noticed more with adduction.  Better now.  Follow.  Will notify me if reoccurrence.         Other Visit Diagnoses    Prostate cancer screening    -  Primary   Relevant Orders   PSA       [name], MD

## 2016-01-21 NOTE — Assessment & Plan Note (Signed)
S/p stents of lower extremity.  Followed by vascular surgery.  Doing well.  Has f/u planned after first of the year.  Not smoking.  Treating cholesterol.

## 2016-01-21 NOTE — Assessment & Plan Note (Signed)
Previous slightly decreased hgb.  B12 low normal range.  On oral B12.  Recheck cbc and B12 with next labs.

## 2016-03-24 ENCOUNTER — Other Ambulatory Visit: Payer: Self-pay | Admitting: Internal Medicine

## 2016-03-24 NOTE — Telephone Encounter (Signed)
Refilled on 11/30/2015 with 3 refills Last Ov: 01/21/2016 Next Ov: no follow up appt scheduled.   Please advise

## 2016-03-25 NOTE — Telephone Encounter (Signed)
ok'd rx for amitriptyline #30 with 3 refills.

## 2016-03-30 ENCOUNTER — Encounter (INDEPENDENT_AMBULATORY_CARE_PROVIDER_SITE_OTHER): Payer: Self-pay

## 2016-03-30 ENCOUNTER — Ambulatory Visit (INDEPENDENT_AMBULATORY_CARE_PROVIDER_SITE_OTHER): Payer: Self-pay | Admitting: Vascular Surgery

## 2016-04-06 ENCOUNTER — Other Ambulatory Visit: Payer: Self-pay | Admitting: Internal Medicine

## 2016-04-29 ENCOUNTER — Other Ambulatory Visit (INDEPENDENT_AMBULATORY_CARE_PROVIDER_SITE_OTHER): Payer: Self-pay | Admitting: Vascular Surgery

## 2016-04-29 ENCOUNTER — Ambulatory Visit (INDEPENDENT_AMBULATORY_CARE_PROVIDER_SITE_OTHER): Payer: 59 | Admitting: Vascular Surgery

## 2016-04-29 ENCOUNTER — Ambulatory Visit (INDEPENDENT_AMBULATORY_CARE_PROVIDER_SITE_OTHER): Payer: 59

## 2016-04-29 ENCOUNTER — Encounter (INDEPENDENT_AMBULATORY_CARE_PROVIDER_SITE_OTHER): Payer: Self-pay | Admitting: Vascular Surgery

## 2016-04-29 VITALS — BP 129/80 | HR 74 | Resp 16 | Ht 68.0 in | Wt 179.0 lb

## 2016-04-29 DIAGNOSIS — E7800 Pure hypercholesterolemia, unspecified: Secondary | ICD-10-CM

## 2016-04-29 DIAGNOSIS — I739 Peripheral vascular disease, unspecified: Secondary | ICD-10-CM | POA: Diagnosis not present

## 2016-04-29 DIAGNOSIS — Z720 Tobacco use: Secondary | ICD-10-CM | POA: Diagnosis not present

## 2016-05-16 ENCOUNTER — Telehealth: Payer: Self-pay | Admitting: *Deleted

## 2016-05-16 DIAGNOSIS — Z87891 Personal history of nicotine dependence: Secondary | ICD-10-CM

## 2016-05-16 NOTE — Telephone Encounter (Signed)
Received referral for initial lung cancer screening scan. Contacted patient and obtained smoking history,(former, quit 05/2015, 37 pack year) as well as answering questions related to screening process. Patient denies signs of lung cancer such as weight loss or hemoptysis. Patient denies comorbidity that would prevent curative treatment if lung cancer were found. Patient is tentatively scheduled for shared decision making visit and CT scan on 05/24/16, pending insurance approval from business office.

## 2016-05-23 ENCOUNTER — Other Ambulatory Visit: Payer: Self-pay | Admitting: *Deleted

## 2016-05-23 DIAGNOSIS — Z87891 Personal history of nicotine dependence: Secondary | ICD-10-CM

## 2016-05-24 ENCOUNTER — Inpatient Hospital Stay: Payer: 59 | Admitting: Oncology

## 2016-05-24 ENCOUNTER — Ambulatory Visit
Admission: RE | Admit: 2016-05-24 | Discharge: 2016-05-24 | Disposition: A | Discharge status: Home / Self Care | Payer: 59 | Source: Ambulatory Visit | Attending: Oncology | Admitting: Oncology

## 2016-05-24 ENCOUNTER — Encounter: Payer: Self-pay | Admitting: Oncology

## 2016-05-24 ENCOUNTER — Ambulatory Visit: Payer: 59

## 2016-05-24 DIAGNOSIS — Z87891 Personal history of nicotine dependence: Secondary | ICD-10-CM | POA: Insufficient documentation

## 2016-05-24 DIAGNOSIS — J439 Emphysema, unspecified: Secondary | ICD-10-CM | POA: Insufficient documentation

## 2016-05-25 ENCOUNTER — Other Ambulatory Visit: Payer: Self-pay | Admitting: Internal Medicine

## 2016-05-25 ENCOUNTER — Other Ambulatory Visit (INDEPENDENT_AMBULATORY_CARE_PROVIDER_SITE_OTHER): Payer: Self-pay | Admitting: Vascular Surgery

## 2016-05-27 ENCOUNTER — Encounter: Payer: Self-pay | Admitting: *Deleted

## 2016-05-30 ENCOUNTER — Other Ambulatory Visit: Payer: Self-pay | Admitting: Internal Medicine

## 2016-07-01 DIAGNOSIS — D485 Neoplasm of uncertain behavior of skin: Secondary | ICD-10-CM | POA: Diagnosis not present

## 2016-07-01 DIAGNOSIS — L570 Actinic keratosis: Secondary | ICD-10-CM | POA: Diagnosis not present

## 2016-07-01 DIAGNOSIS — L814 Other melanin hyperpigmentation: Secondary | ICD-10-CM | POA: Diagnosis not present

## 2016-07-01 DIAGNOSIS — C4490 Unspecified malignant neoplasm of skin, unspecified: Secondary | ICD-10-CM | POA: Diagnosis not present

## 2016-07-19 NOTE — Progress Notes (Signed)
Subjective:    Patient ID: Travis Gray, male    DOB: 04/30/1955, 61 y.o.   MRN: [id number] Chief Complaint  Patient presents with  . Re-evaluation    6 month ABI   Patient presents for a 6 month peripheral artery disease follow-up. Presents today without complaint. Denies any claudication, rest pain or ulceration to his lower extremity. The patient underwent a bilateral lower extremity ABI which was notable for right: 1.13 and left: 1.08. The great toe pressure and PPG waveform are within normal limits bilaterally when compared to previous ABI on 09/25/2015 these results are stable the patient also underwent a right lower extremity arterial duplex exam which was notable for triphasic blood flow and patent stents distally. When compared to the previous exam on 09/25/2015 this is also stable. Patient denies any fever, nausea or vomiting.   Review of Systems  Constitutional: Negative.   HENT: Negative.   Eyes: Negative.   Respiratory: Negative.   Cardiovascular: Negative.   Gastrointestinal: Negative.   Endocrine: Negative.   Genitourinary: Negative.   Musculoskeletal: Negative.   Skin: Negative.   Allergic/Immunologic: Negative.   Neurological: Negative.   Hematological: Negative.   Psychiatric/Behavioral: Negative.       Objective:   Physical Exam  Constitutional: He is oriented to person, place, and time. He appears well-developed and well-nourished. No distress.  HENT:  Head: Normocephalic and atraumatic.  Eyes: Conjunctivae are normal. Pupils are equal, round, and reactive to light.  Neck: Normal range of motion.  Cardiovascular: Normal rate, regular rhythm, normal heart sounds and intact distal pulses.   Pulses:      Radial pulses are 2+ on the right side, and 2+ on the left side.       Dorsalis pedis pulses are 2+ on the right side, and 2+ on the left side.       Posterior tibial pulses are 2+ on the right side, and 2+ on the left side.  Pulmonary/Chest: Effort  normal.  Musculoskeletal: Normal range of motion. He exhibits no edema.  Neurological: He is alert and oriented to person, place, and time.  Skin: Skin is warm and dry. He is not diaphoretic.  Psychiatric: He has a normal mood and affect. His behavior is normal. Judgment and thought content normal.  Vitals reviewed.  BP 129/80 (BP Location: Right Arm)   Pulse 74   Resp 16   Ht 5\' 8"  (1.727 m)   Wt 179 lb (81.2 kg)   BMI 27.22 kg/m   Past Medical History:  Diagnosis Date  . Chronic headaches   . Degenerative disc disease, cervical   . Hypercholesterolemia   . Hypertension   . Peripheral vascular disease ([place])   . Tobacco abuse    Social History   Social History  . Marital status: Married    Spouse name: N/A  . Number of children: N/A  . Years of education: N/A   Occupational History  . Not on file.   Social History Main Topics  . Smoking status: Former Smoker    Packs/day: 1.00    Years: 37.00    Types: Cigarettes    Quit date: 05/25/2015  . Smokeless tobacco: Never Used  . Alcohol use 0.0 oz/week     Comment: very occasional alcohol use  . Drug use: No  . Sexual activity: Not on file   Other Topics Concern  . Not on file   Social History Narrative  . No narrative on file   Past Surgical  History:  Procedure Laterality Date  . HERNIA REPAIR     Inguinal,laparoscopic surgery  . PERIPHERAL VASCULAR CATHETERIZATION Right 05/21/2015   Procedure: Lower Extremity Angiography;  Surgeon: Algernon Huxley, MD;  Location: [facility];  Service: Cardiovascular;  Laterality: Right;  . PERIPHERAL VASCULAR CATHETERIZATION  05/21/2015   Procedure: Lower Extremity Intervention;  Surgeon: Algernon Huxley, MD;  Location: [facility];  Service: Cardiovascular;;  . radio-ablated therapy     completed in the pain clinic   Family History  Problem Relation Age of Onset  . Hypertension Mother   . Hyperlipidemia Father   . Cancer Paternal Grandfather   . Diabetes  Maternal Grandmother    No Known Allergies     Assessment & Plan:  Patient presents for a 6 month peripheral artery disease follow-up. Presents today without complaint. Denies any claudication, rest pain or ulceration to his lower extremity. The patient underwent a bilateral lower extremity ABI which was notable for right: 1.13 and left: 1.08. The great toe pressure and PPG waveform are within normal limits bilaterally when compared to previous ABI on 09/25/2015 these results are stable the patient also underwent a right lower extremity arterial duplex exam which was notable for triphasic blood flow and patent stents distally. When compared to the previous exam on 09/25/2015 this is also stable. Patient denies any fever, nausea or vomiting.  1. Peripheral vascular disease ([place]) - Stable Patient is asymptomatic ABI and arterial duplex are stable Physical exam is unremarkable No indication for intervention at this time Patient to follow up in 1 year with ABI and right lower extremity arterial duplex to assess stent. Patient to call the office if he starts to experience any symptoms sooner  - VAS [place] ABI WITH/WO TBI; Future - VAS [place] LOWER EXTREMITY ARTERIAL DUPLEX; Future  2. Pure hypercholesterolemia  -stable Encouraged good control as its slows the progression of atherosclerotic disease  3. Tobacco abuse - stable I have discussed (approximately 5 minutes) with the patient the role of tobacco in the pathogenesis of atherosclerosis and its effect on the progression of the disease, impact on the durability of interventions and its limitations on the formation of collateral pathways. I have recommended absolute tobacco cessation. I have discussed various options available for assistance with tobacco cessation including over the counter methods (Nicotine gum, patch and lozenges). We also discussed prescription options (Chantix, Nicotine Inhaler / Nasal Spray). The patient is not interested in  pursuing any prescription tobacco cessation options at this time. The patient voices their understanding.    Current Outpatient Prescriptions on File Prior to Visit  Medication Sig Dispense Refill  . amitriptyline (ELAVIL) 50 MG tablet TAKE ONE TABLET BY MOUTH EVERY NIGHT AT BEDTIME. 30 tablet 3  . clopidogrel (PLAVIX) 75 MG tablet Take 1 tablet (75 mg total) by mouth daily. 30 tablet 11  . metoprolol succinate (TOPROL-XL) 50 MG 24 hr tablet Take 1 tablet (50 mg total) by mouth daily. Take with or immediately following a meal. 30 tablet 5  . omega-3 acid ethyl esters (LOVAZA) 1 g capsule Take 1 capsule (1 g total) by mouth daily. 90 capsule 3  . omeprazole (PRILOSEC) 40 MG capsule TAKE 1 CAPSULE BY MOUTH ONCE DAILY 30 capsule 3   No current facility-administered medications on file prior to visit.     There are no Patient Instructions on file for this visit. No Follow-up on file.   Fany Cavanaugh A Jaana Brodt, PA-C

## 2016-08-01 ENCOUNTER — Other Ambulatory Visit: Payer: Self-pay | Admitting: Internal Medicine

## 2016-08-01 NOTE — Telephone Encounter (Signed)
Last OV 12/17 please advise to refill.

## 2016-08-01 NOTE — Telephone Encounter (Signed)
Please notify pt that omeprazole can interact with plavix.  Confirm that hs is not having acid reflux or any other upper GI issues.  If no, would he be willing to try a different medication.  Would he be willing to try zantac 150mg  bid.  If so, would like to change omprazole to zantac as outlined.  Thanks  Ok to refill amitriptyline #30 with 2 refills.

## 2016-08-02 MED ORDER — RANITIDINE HCL 150 MG PO TABS: 150.0000 mg | ORAL_TABLET | Freq: Two times a day (BID) | ORAL | 3 refills | Status: DC

## 2016-08-02 NOTE — Telephone Encounter (Signed)
Spoke with the patient and discussed in detail that the Prilosec and Plavix have an interaction and he is willing to try the Zantac.  He has no issues with reflux as long as he takes the Prilosec currently, so he needs something.  Sent In the Zantac and Elavil to the pharmacy, thanks

## 2016-08-18 ENCOUNTER — Telehealth: Payer: Self-pay | Admitting: Internal Medicine

## 2016-08-18 NOTE — Telephone Encounter (Signed)
Pt called c/o rt sided pain for about a week,he also states that he is having pain while urniating. Offered pt appt with another provider but only wants to see Dr. Nicki Reaper. Please advise, thank you!  Call pt @ [phone]

## 2016-08-18 NOTE — Telephone Encounter (Signed)
Agree with need for evaluation.  Per note, pt made aware of need for evaluation.

## 2016-08-18 NOTE — Telephone Encounter (Signed)
Called patient he states he has been having symptoms x 1 week pain in abdomen 3/10 some burning with urination. He denies any fever, n&v, blood in urine. 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 I have given him app time/date on Monday. Informed him if any change in symptoms or new symptoms he will need to be evaluated before app at ED or walk in. Went over everything with him and he repeated back to me.

## 2016-08-22 ENCOUNTER — Ambulatory Visit (INDEPENDENT_AMBULATORY_CARE_PROVIDER_SITE_OTHER): Payer: 59 | Admitting: Internal Medicine

## 2016-08-22 ENCOUNTER — Encounter: Payer: Self-pay | Admitting: Internal Medicine

## 2016-08-22 VITALS — BP 120/80 | HR 66 | Temp 98.6°F | Resp 12 | Ht 66.0 in | Wt 171.2 lb

## 2016-08-22 DIAGNOSIS — I10 Essential (primary) hypertension: Secondary | ICD-10-CM

## 2016-08-22 DIAGNOSIS — Z125 Encounter for screening for malignant neoplasm of prostate: Secondary | ICD-10-CM | POA: Diagnosis not present

## 2016-08-22 DIAGNOSIS — R350 Frequency of micturition: Secondary | ICD-10-CM

## 2016-08-22 DIAGNOSIS — K219 Gastro-esophageal reflux disease without esophagitis: Secondary | ICD-10-CM | POA: Insufficient documentation

## 2016-08-22 DIAGNOSIS — M5442 Lumbago with sciatica, left side: Secondary | ICD-10-CM

## 2016-08-22 DIAGNOSIS — I739 Peripheral vascular disease, unspecified: Secondary | ICD-10-CM

## 2016-08-22 DIAGNOSIS — D649 Anemia, unspecified: Secondary | ICD-10-CM

## 2016-08-22 DIAGNOSIS — E7800 Pure hypercholesterolemia, unspecified: Secondary | ICD-10-CM | POA: Diagnosis not present

## 2016-08-22 DIAGNOSIS — R109 Unspecified abdominal pain: Secondary | ICD-10-CM | POA: Diagnosis not present

## 2016-08-22 MED ORDER — ATORVASTATIN CALCIUM 10 MG PO TABS: 10.0000 mg | ORAL_TABLET | Freq: Every day | ORAL | 3 refills | Status: DC

## 2016-08-22 MED ORDER — PANTOPRAZOLE SODIUM 40 MG PO TBEC: 40.0000 mg | DELAYED_RELEASE_TABLET | Freq: Every day | ORAL | 3 refills | Status: DC

## 2016-08-22 MED ORDER — LOSARTAN POTASSIUM-HCTZ 100-25 MG PO TABS: 1.0000 | ORAL_TABLET | Freq: Every day | ORAL | 3 refills | Status: DC

## 2016-08-22 NOTE — Assessment & Plan Note (Signed)
Blood pressure under good control.  Continue same medication regimen.  Follow pressures.  Follow metabolic panel.   

## 2016-08-22 NOTE — Assessment & Plan Note (Signed)
Was questioning if this could be contributing to his abdominal pain.  Worse with certain positions.  Unclear etiology.   Will obtain CT of abdomen and pelvis as outlined.  Check urine for infection.  Consider treatment for prostatitis - follow symptoms.

## 2016-08-22 NOTE — Assessment & Plan Note (Signed)
Right side abdominal pain as outlined.  Feels sometimes related to eating.  No clear trigger.  Check cbc, met b and liver panel.  Will also obtain CT scan.  Further w/up pending results.

## 2016-08-22 NOTE — Assessment & Plan Note (Signed)
Low cholesterol diet and exercise.  On lipitor.  Follow lipid panel and liver function tests.   

## 2016-08-22 NOTE — Progress Notes (Signed)
Patient ID: Travis Gray, male   DOB: 25-Jun-1955, 61 y.o.   MRN: [id number]   Subjective:    Patient ID: Travis Gray, male    DOB: 05/13/1955, 61 y.o.   MRN: [id number]  HPI  Patient here as a work in with concerns regarding some right side abdominal pain.  This pain started one month ago.  Notices occasionally when he eats, but can occur at other times as well.  Also has been having some right low back pain.  This started prior to the abdominal discomfort.  No bowel change.  The back hurts worse with certain positions and movements.  Also has noticed some increased urinary frequency.  No dysuria.  No hematuria.  No blood in his stool.  No fever.  Eating and drinking.  No vomiting.     Past Medical History:  Diagnosis Date  . Chronic headaches   . Degenerative disc disease, cervical   . Hypercholesterolemia   . Hypertension   . Peripheral vascular disease ([place])   . Tobacco abuse    Past Surgical History:  Procedure Laterality Date  . HERNIA REPAIR     Inguinal,laparoscopic surgery  . PERIPHERAL VASCULAR CATHETERIZATION Right 05/21/2015   Procedure: Lower Extremity Angiography;  Surgeon: Algernon Huxley, MD;  Location: [place] [facility];  Service: Cardiovascular;  Laterality: Right;  . PERIPHERAL VASCULAR CATHETERIZATION  05/21/2015   Procedure: Lower Extremity Intervention;  Surgeon: Algernon Huxley, MD;  Location: [place] [facility];  Service: Cardiovascular;;  . radio-ablated therapy     completed in the pain clinic   Family History  Problem Relation Age of Onset  . Hypertension Mother   . Hyperlipidemia Father   . Cancer Paternal Grandfather   . Diabetes Maternal Grandmother    Social History   Social History  . Marital status: Married    Spouse name: N/A  . Number of children: N/A  . Years of education: N/A   Social History Main Topics  . Smoking status: Former Smoker    Packs/day: 1.00    Years: 37.00    Types: Cigarettes    Quit date: 05/25/2015  . Smokeless  tobacco: Never Used  . Alcohol use 0.0 oz/week     Comment: very occasional alcohol use  . Drug use: No  . Sexual activity: Not Asked   Other Topics Concern  . None   Social History Narrative  . None    Outpatient Encounter Prescriptions as of 08/22/2016  Medication Sig  . amitriptyline (ELAVIL) 50 MG tablet TAKE 1 TABLET BY MOUTH ONCE DAILY BEDTIME  . atorvastatin (LIPITOR) 10 MG tablet Take 1 tablet (10 mg total) by mouth daily.  . clopidogrel (PLAVIX) 75 MG tablet Take 1 tablet (75 mg total) by mouth daily.  Marland Kitchen losartan-hydrochlorothiazide (HYZAAR) 100-25 MG tablet Take 1 tablet by mouth daily.  . metoprolol succinate (TOPROL-XL) 50 MG 24 hr tablet Take 1 tablet (50 mg total) by mouth daily. Take with or immediately following a meal.  . omega-3 acid ethyl esters (LOVAZA) 1 g capsule Take 1 capsule (1 g total) by mouth daily.  . vitamin B-12 (CYANOCOBALAMIN) 1000 MCG tablet Take 1,000 mcg by mouth daily.  . [DISCONTINUED] atorvastatin (LIPITOR) 10 MG tablet TAKE ONE TABLET BY MOUTH ONCE DAILY  . [DISCONTINUED] losartan-hydrochlorothiazide (HYZAAR) 100-25 MG tablet TAKE 1 TABLET BY MOUTH ONCE DAILY.  . pantoprazole (PROTONIX) 40 MG tablet Take 1 tablet (40 mg total) by mouth daily.  . ranitidine (ZANTAC) 150 MG  tablet Take 1 tablet (150 mg total) by mouth 2 (two) times daily.  . [DISCONTINUED] omeprazole (PRILOSEC) 40 MG capsule TAKE 1 CAPSULE BY MOUTH ONCE DAILY   No facility-administered encounter medications on file as of 08/22/2016.     Review of Systems  Constitutional: Negative for appetite change and fever.  Respiratory: Negative for cough, chest tightness and shortness of breath.   Cardiovascular: Negative for chest pain, palpitations and leg swelling.  Gastrointestinal: Positive for abdominal pain. Negative for diarrhea and vomiting.  Genitourinary: Positive for frequency. Negative for difficulty urinating and dysuria.  Musculoskeletal: Positive for back pain. Negative for  joint swelling and myalgias.  Skin: Negative for color change and rash.  Neurological: Negative for dizziness, light-headedness and headaches.  Psychiatric/Behavioral: Negative for agitation and dysphoric mood.       Objective:     Blood pressure rechecked by me:  128/78  Physical Exam  Constitutional: He appears well-developed and well-nourished. No distress.  HENT:  Nose: Nose normal.  Mouth/Throat: Oropharynx is clear and moist.  Neck: Neck supple.  Cardiovascular: Normal rate and regular rhythm.   Pulmonary/Chest: Effort normal and breath sounds normal. No respiratory distress.  Abdominal: Soft. Bowel sounds are normal.  Tenderness to palpation - right abdomen.  No rebound or guarding.    Genitourinary:  Genitourinary Comments: Rectal:  Slightly boggy prostate.  Heme negative.    Musculoskeletal: He exhibits no edema or tenderness.  Lymphadenopathy:    He has no cervical adenopathy.  Skin: No rash noted. No erythema.  Psychiatric: He has a normal mood and affect. His behavior is normal.    BP 120/80 (BP Location: Left Arm, Patient Position: Sitting, Cuff Size: Normal)   Pulse 66   Temp 98.6 F (37 C) (Oral)   Resp 12   Ht _0  (1.676 m)   Wt 171 lb 3.2 oz (77.7 kg)   SpO2 95%   BMI 27.63 kg/m  Wt Readings from Last 3 Encounters:  08/22/16 171 lb 3.2 oz (77.7 kg)  05/24/16 175 lb (79.4 kg)  04/29/16 179 lb (81.2 kg)     Lab Results  Component Value Date   WBC 4.5 09/15/2015   HGB 13.5 09/15/2015   HCT 38.5 (L) 09/15/2015   PLT 198.0 09/15/2015   GLUCOSE 106 (H) 09/15/2015   CHOL 198 09/15/2015   TRIG (H) 09/15/2015    648.0 Triglyceride is over 400; calculations on Lipids are invalid.   HDL 24.40 (L) 09/15/2015   LDLDIRECT 55.0 09/15/2015   LDLCALC 60 04/24/2013   ALT 15 09/15/2015   AST 15 09/15/2015   NA 140 09/15/2015   K 4.4 09/15/2015   CL 103 09/15/2015   CREATININE 1.07 09/15/2015   BUN 17 09/15/2015   CO2 30 09/15/2015   TSH 2.52  12/11/2014   PSA 1.08 12/11/2014   INR 1.09 05/24/2015   HGBA1C 5.6 06/18/2013    Ct Chest Lung Cancer Screening Low Dose Wo Contrast  Result Date: 05/25/2016 CLINICAL DATA:  Former smoker, quit 1 year ago, 23 pack-year history, lung cancer screening. EXAM: CT CHEST WITHOUT CONTRAST LOW-DOSE FOR LUNG CANCER SCREENING TECHNIQUE: Multidetector CT imaging of the chest was performed following the standard protocol without IV contrast. COMPARISON:  05/24/2015. FINDINGS: Cardiovascular: Vascular structures are unremarkable. Heart size normal. No pericardial effusion. Mediastinum/Nodes: No pathologically enlarged mediastinal or axillary lymph nodes. Hilar regions are difficult to definitively evaluate without IV contrast but appear grossly unremarkable. Esophagus is grossly unremarkable. Lungs/Pleura: Centrilobular and paraseptal emphysema. Mild  scarring in the lower lobes. No worrisome pulmonary nodules. No pleural fluid. Airway is unremarkable. Upper Abdomen: Visualized portions of the liver, adrenal glands, spleen, pancreas, stomach and bowel are grossly unremarkable. No upper abdominal adenopathy. Musculoskeletal: No worrisome lytic or sclerotic lesions. Degenerative changes are seen in the spine. IMPRESSION: 1. Lung-RADS Category 1, negative. Continue annual screening with low-dose chest CT without contrast in 12 months. 2.  Emphysema (ICD10-J43.9). Electronically Signed   By: Lorin Picket [name]   On: 05/25/2016 08:33       Assessment & Plan:   Problem List Items Addressed This Visit    Abdominal pain    Right side abdominal pain as outlined.  Feels sometimes related to eating.  No clear trigger.  Check cbc, met b and liver panel.  Will also obtain CT scan.  Further w/up pending results.        Relevant Orders   Urinalysis, Routine w reflex microscopic   Urine Culture   CT Abdomen Pelvis W Contrast   Anemia   Relevant Medications   vitamin B-12 (CYANOCOBALAMIN) 1000 MCG tablet   Back pain     Was questioning if this could be contributing to his abdominal pain.  Worse with certain positions.  Unclear etiology.   Will obtain CT of abdomen and pelvis as outlined.  Check urine for infection.  Consider treatment for prostatitis - follow symptoms.        Essential hypertension, benign    Blood pressure under good control.  Continue same medication regimen.  Follow pressures.  Follow metabolic panel.        Relevant Medications   losartan-hydrochlorothiazide (HYZAAR) 100-25 MG tablet   atorvastatin (LIPITOR) 10 MG tablet   GERD (gastroesophageal reflux disease)    Symptoms previous controlled on omeprazole.  On plavix.  Changed to zantac.  Not controlling symptoms.  Will change to protonix.in the am and zantac in the pm.  Follow.        Relevant Medications   pantoprazole (PROTONIX) 40 MG tablet   Peripheral vascular disease ([facility])    S/p stents of lower extremity.  Followed by vascular surgery.  Leg doing well.  No pain in his legs.        Relevant Medications   losartan-hydrochlorothiazide (HYZAAR) 100-25 MG tablet   atorvastatin (LIPITOR) 10 MG tablet   Pure hypercholesterolemia    Low cholesterol diet and exercise.  On lipitor.  Follow lipid panel and liver function tests.        Relevant Medications   losartan-hydrochlorothiazide (HYZAAR) 100-25 MG tablet   atorvastatin (LIPITOR) 10 MG tablet    Other Visit Diagnoses    Urinary frequency    -  Primary   check urine to confirm no infection.  prostate exam as outlined.  hold on treatment for prostatitis.       Prostate cancer screening           [name], MD

## 2016-08-22 NOTE — Assessment & Plan Note (Signed)
Symptoms previous controlled on omeprazole.  On plavix.  Changed to zantac.  Not controlling symptoms.  Will change to protonix.in the am and zantac in the pm.  Follow.

## 2016-08-22 NOTE — Assessment & Plan Note (Signed)
S/p stents of lower extremity.  Followed by vascular surgery.  Leg doing well.  No pain in his legs.

## 2016-08-22 NOTE — Progress Notes (Signed)
Pre-visit discussion using our clinic review tool. No additional management support is needed unless otherwise documented below in the visit note.  

## 2016-08-23 LAB — URINALYSIS, ROUTINE W REFLEX MICROSCOPIC
Bilirubin Urine: NEGATIVE
Hgb urine dipstick: NEGATIVE
KETONES UR: NEGATIVE
LEUKOCYTES UA: NEGATIVE
NITRITE: NEGATIVE
PH: 6 (ref 5.0–8.0)
RBC / HPF: NONE SEEN (ref 0–?)
Specific Gravity, Urine: 1.015 (ref 1.000–1.030)
TOTAL PROTEIN, URINE-UPE24: NEGATIVE
UROBILINOGEN UA: 0.2 (ref 0.0–1.0)
Urine Glucose: NEGATIVE

## 2016-08-23 LAB — CBC WITH DIFFERENTIAL/PLATELET
BASOS PCT: 1.2 % (ref 0.0–3.0)
Basophils Absolute: 0.1 10*3/uL (ref 0.0–0.1)
EOS PCT: 2.4 % (ref 0.0–5.0)
Eosinophils Absolute: 0.2 10*3/uL (ref 0.0–0.7)
HEMATOCRIT: 39.5 % (ref 39.0–52.0)
HEMOGLOBIN: 13.6 g/dL (ref 13.0–17.0)
Lymphocytes Relative: 38 % (ref 12.0–46.0)
Lymphs Abs: 2.6 10*3/uL (ref 0.7–4.0)
MCHC: 34.5 g/dL (ref 30.0–36.0)
MCV: 94.2 fl (ref 78.0–100.0)
MONO ABS: 0.7 10*3/uL (ref 0.1–1.0)
Monocytes Relative: 9.8 % (ref 3.0–12.0)
NEUTROS ABS: 3.3 10*3/uL (ref 1.4–7.7)
Neutrophils Relative %: 48.6 % (ref 43.0–77.0)
PLATELETS: 216 10*3/uL (ref 150.0–400.0)
RBC: 4.19 Mil/uL — ABNORMAL LOW (ref 4.22–5.81)
RDW: 13 % (ref 11.5–15.5)
WBC: 6.8 10*3/uL (ref 4.0–10.5)

## 2016-08-23 LAB — BASIC METABOLIC PANEL
BUN: 16 mg/dL (ref 6–23)
CO2: 32 meq/L (ref 19–32)
Calcium: 9.4 mg/dL (ref 8.4–10.5)
Chloride: 101 mEq/L (ref 96–112)
Creatinine, Ser: 1.01 mg/dL (ref 0.40–1.50)
GFR: 80.28 mL/min (ref >60.00)
Glucose, Bld: 84 mg/dL (ref 70–99)
Potassium: 3.7 mEq/L (ref 3.5–5.1)
SODIUM: 140 meq/L (ref 135–145)

## 2016-08-23 LAB — HEPATIC FUNCTION PANEL
ALBUMIN: 4.6 g/dL (ref 3.5–5.2)
ALT: 14 U/L (ref 0–53)
AST: 20 U/L (ref 0–37)
Alkaline Phosphatase: 65 U/L (ref 39–117)
Bilirubin, Direct: 0.1 mg/dL (ref 0.0–0.3)
Total Bilirubin: 0.6 mg/dL (ref 0.2–1.2)
Total Protein: 6.9 g/dL (ref 6.0–8.3)

## 2016-08-23 LAB — URINE CULTURE: Organism ID, Bacteria: NO GROWTH

## 2016-08-23 LAB — FERRITIN: FERRITIN: 144.8 ng/mL (ref 22.0–322.0)

## 2016-08-23 LAB — PSA: PSA: 2.03 ng/mL (ref 0.10–4.00)

## 2016-08-23 LAB — VITAMIN B12: VITAMIN B 12: 871 pg/mL (ref 211–911)

## 2016-08-25 ENCOUNTER — Other Ambulatory Visit: Payer: Self-pay | Admitting: Internal Medicine

## 2016-08-26 ENCOUNTER — Ambulatory Visit
Admission: RE | Admit: 2016-08-26 | Discharge: 2016-08-26 | Disposition: A | Discharge status: Home / Self Care | Payer: Commercial Managed Care - HMO | Source: Ambulatory Visit | Attending: Internal Medicine | Admitting: Internal Medicine

## 2016-08-26 DIAGNOSIS — K5730 Diverticulosis of large intestine without perforation or abscess without bleeding: Secondary | ICD-10-CM | POA: Insufficient documentation

## 2016-08-26 DIAGNOSIS — R112 Nausea with vomiting, unspecified: Secondary | ICD-10-CM | POA: Diagnosis not present

## 2016-08-26 DIAGNOSIS — R109 Unspecified abdominal pain: Secondary | ICD-10-CM | POA: Diagnosis not present

## 2016-08-26 DIAGNOSIS — Z9889 Other specified postprocedural states: Secondary | ICD-10-CM | POA: Diagnosis not present

## 2016-08-26 DIAGNOSIS — K4090 Unilateral inguinal hernia, without obstruction or gangrene, not specified as recurrent: Secondary | ICD-10-CM | POA: Diagnosis not present

## 2016-08-26 DIAGNOSIS — R1110 Vomiting, unspecified: Secondary | ICD-10-CM | POA: Diagnosis not present

## 2016-08-26 DIAGNOSIS — R1031 Right lower quadrant pain: Secondary | ICD-10-CM | POA: Diagnosis not present

## 2016-08-26 MED ORDER — IOPAMIDOL (ISOVUE-300) INJECTION 61%
100.0000 mL | Freq: Once | INTRAVENOUS | Status: AC | PRN
  Administered 2016-08-26: 100 mL via INTRAVENOUS

## 2016-08-28 ENCOUNTER — Other Ambulatory Visit: Payer: Self-pay | Admitting: Internal Medicine

## 2016-08-29 ENCOUNTER — Other Ambulatory Visit: Payer: Self-pay | Admitting: Internal Medicine

## 2016-08-29 DIAGNOSIS — R110 Nausea: Secondary | ICD-10-CM

## 2016-08-29 DIAGNOSIS — R109 Unspecified abdominal pain: Secondary | ICD-10-CM

## 2016-08-29 NOTE — Progress Notes (Signed)
Order placed for referral to GI 

## 2016-08-31 DIAGNOSIS — K219 Gastro-esophageal reflux disease without esophagitis: Secondary | ICD-10-CM | POA: Diagnosis not present

## 2016-08-31 DIAGNOSIS — R1013 Epigastric pain: Secondary | ICD-10-CM | POA: Diagnosis not present

## 2016-08-31 DIAGNOSIS — Z1211 Encounter for screening for malignant neoplasm of colon: Secondary | ICD-10-CM | POA: Diagnosis not present

## 2016-10-01 ENCOUNTER — Other Ambulatory Visit: Payer: Self-pay | Admitting: Internal Medicine

## 2016-11-25 ENCOUNTER — Encounter
Admission: RE | Disposition: A | Discharge status: Home / Self Care | Payer: Self-pay | Source: Ambulatory Visit | Attending: Internal Medicine

## 2016-11-25 ENCOUNTER — Ambulatory Visit
Admission: RE | Admit: 2016-11-25 | Discharge: 2016-11-25 | Disposition: A | Discharge status: Home / Self Care | Payer: Commercial Managed Care - HMO | Source: Ambulatory Visit | Attending: Internal Medicine | Admitting: Internal Medicine

## 2016-11-25 ENCOUNTER — Ambulatory Visit: Payer: Commercial Managed Care - HMO | Admitting: Anesthesiology

## 2016-11-25 ENCOUNTER — Encounter: Payer: Self-pay | Admitting: *Deleted

## 2016-11-25 DIAGNOSIS — K5730 Diverticulosis of large intestine without perforation or abscess without bleeding: Secondary | ICD-10-CM | POA: Insufficient documentation

## 2016-11-25 DIAGNOSIS — D128 Benign neoplasm of rectum: Secondary | ICD-10-CM | POA: Diagnosis not present

## 2016-11-25 DIAGNOSIS — R1011 Right upper quadrant pain: Secondary | ICD-10-CM | POA: Diagnosis not present

## 2016-11-25 DIAGNOSIS — K2950 Unspecified chronic gastritis without bleeding: Secondary | ICD-10-CM | POA: Diagnosis not present

## 2016-11-25 DIAGNOSIS — K640 First degree hemorrhoids: Secondary | ICD-10-CM | POA: Insufficient documentation

## 2016-11-25 DIAGNOSIS — K219 Gastro-esophageal reflux disease without esophagitis: Secondary | ICD-10-CM | POA: Diagnosis not present

## 2016-11-25 DIAGNOSIS — E7800 Pure hypercholesterolemia, unspecified: Secondary | ICD-10-CM | POA: Insufficient documentation

## 2016-11-25 DIAGNOSIS — Z79899 Other long term (current) drug therapy: Secondary | ICD-10-CM | POA: Diagnosis not present

## 2016-11-25 DIAGNOSIS — I739 Peripheral vascular disease, unspecified: Secondary | ICD-10-CM | POA: Insufficient documentation

## 2016-11-25 DIAGNOSIS — K5790 Diverticulosis of intestine, part unspecified, without perforation or abscess without bleeding: Secondary | ICD-10-CM | POA: Diagnosis not present

## 2016-11-25 DIAGNOSIS — K635 Polyp of colon: Secondary | ICD-10-CM | POA: Diagnosis not present

## 2016-11-25 DIAGNOSIS — Z1211 Encounter for screening for malignant neoplasm of colon: Secondary | ICD-10-CM | POA: Diagnosis not present

## 2016-11-25 DIAGNOSIS — K449 Diaphragmatic hernia without obstruction or gangrene: Secondary | ICD-10-CM | POA: Diagnosis not present

## 2016-11-25 DIAGNOSIS — D126 Benign neoplasm of colon, unspecified: Secondary | ICD-10-CM | POA: Diagnosis not present

## 2016-11-25 DIAGNOSIS — Z87891 Personal history of nicotine dependence: Secondary | ICD-10-CM | POA: Insufficient documentation

## 2016-11-25 DIAGNOSIS — K2960 Other gastritis without bleeding: Secondary | ICD-10-CM | POA: Diagnosis not present

## 2016-11-25 DIAGNOSIS — Z7902 Long term (current) use of antithrombotics/antiplatelets: Secondary | ICD-10-CM | POA: Insufficient documentation

## 2016-11-25 DIAGNOSIS — R1031 Right lower quadrant pain: Secondary | ICD-10-CM | POA: Diagnosis not present

## 2016-11-25 DIAGNOSIS — I10 Essential (primary) hypertension: Secondary | ICD-10-CM | POA: Diagnosis not present

## 2016-11-25 DIAGNOSIS — R109 Unspecified abdominal pain: Secondary | ICD-10-CM | POA: Diagnosis present

## 2016-11-25 HISTORY — PX: ESOPHAGOGASTRODUODENOSCOPY (EGD) WITH PROPOFOL: SHX5813

## 2016-11-25 HISTORY — PX: COLONOSCOPY WITH PROPOFOL: SHX5780

## 2016-11-25 LAB — HM COLONOSCOPY

## 2016-11-25 SURGERY — ESOPHAGOGASTRODUODENOSCOPY (EGD) WITH PROPOFOL
Anesthesia: General

## 2016-11-25 MED ORDER — PROPOFOL 500 MG/50ML IV EMUL
INTRAVENOUS | Status: AC
Start: 2016-11-25 — End: 2016-11-25
  Filled 2016-11-25: qty 50

## 2016-11-25 MED ORDER — LIDOCAINE HCL (PF) 2 % IJ SOLN
INTRAMUSCULAR | Status: AC
Start: 2016-11-25 — End: 2016-11-25
  Filled 2016-11-25: qty 10

## 2016-11-25 MED ORDER — PROPOFOL 10 MG/ML IV BOLUS
INTRAVENOUS | Status: DC | PRN
  Administered 2016-11-25 (×2): 40 mg via INTRAVENOUS

## 2016-11-25 MED ORDER — PROPOFOL 500 MG/50ML IV EMUL
INTRAVENOUS | Status: DC | PRN
  Administered 2016-11-25: 160 ug/kg/min via INTRAVENOUS

## 2016-11-25 MED ORDER — SODIUM CHLORIDE 0.9 % IV SOLN
INTRAVENOUS | Status: DC
  Administered 2016-11-25: 13:00:00 via INTRAVENOUS

## 2016-11-25 MED ORDER — LIDOCAINE HCL (CARDIAC) 20 MG/ML IV SOLN
INTRAVENOUS | Status: DC | PRN
  Administered 2016-11-25: 50 mg via INTRAVENOUS

## 2016-11-25 NOTE — Op Note (Signed)
[facility] Gastroenterology Patient Name: Travis Gray Procedure Date: 11/25/2016 1:49 PM MRN: [id number] Account #: [id number] Date of Birth: [birth year] Admit Type: Outpatient Age: 61 Room: [facility] ENDO ROOM 3 Gender: Male Note Status: Finalized Procedure:            Upper GI endoscopy Indications:          Epigastric abdominal pain, Gastro-esophageal reflux                        disease Providers:            [name]. Alice Reichert MD, MD Referring MD:         [name], MD (Referring MD) Medicines:            Propofol per Anesthesia Complications:        No immediate complications. Procedure:            Pre-Anesthesia Assessment:                       - The risks and benefits of the procedure and the                        sedation options and risks were discussed with the                        patient. All questions were answered and informed                        consent was obtained.                       - Patient identification and proposed procedure were                        verified prior to the procedure by the nurse. The                        procedure was verified in the procedure room.                       - ASA Grade Assessment: III - A patient with severe                        systemic disease.                       - After reviewing the risks and benefits, the patient                        was deemed in satisfactory condition to undergo the                        procedure.                       After obtaining informed consent, the endoscope was                        passed under direct vision. Throughout the procedure,                        the  patient's blood pressure, pulse, and oxygen                        saturations were monitored continuously. The Endoscope                        was introduced through the mouth, and advanced to the                        third part of duodenum. The upper GI endoscopy was   accomplished without difficulty. The patient tolerated                        the procedure well. Findings:      The examined esophagus was normal.      Diffuse mildly erythematous mucosa without bleeding was found on the       anterior wall of the gastric antrum. Biopsies were taken with a cold       forceps for Helicobacter pylori testing.      A 2 cm hiatal hernia was present.      The examined duodenum was normal. Impression:           - Normal esophagus.                       - Erythematous mucosa in the anterior wall of the                        gastric antrum. Biopsied.                       - 2 cm hiatal hernia.                       - Normal examined duodenum. Recommendation:       - Await pathology results.                       - See the other procedure note for documentation of                        additional recommendations. Procedure Code(s):    --- Professional ---                       [phone], Esophagogastroduodenoscopy, flexible, transoral;                        with biopsy, single or multiple Diagnosis Code(s):    --- Professional ---                       K31.89, Other diseases of stomach and duodenum                       K44.9, Diaphragmatic hernia without obstruction or                        gangrene                       R10.13, Epigastric pain                       K21.9,  Gastro-esophageal reflux disease without                        esophagitis CPT copyright 2016 American Medical Association. All rights reserved. The codes documented in this report are preliminary and upon coder review may  be revised to meet current compliance requirements. Efrain Sella MD, MD 11/25/2016 2:05:28 PM This report has been signed electronically. Number of Addenda: 0 Note Initiated On: 11/25/2016 1:49 PM      [facility]

## 2016-11-25 NOTE — Transfer of Care (Signed)
Immediate Anesthesia Transfer of Care Note  Patient: Travis Gray  Procedure(s) Performed: ESOPHAGOGASTRODUODENOSCOPY (EGD) WITH PROPOFOL (N/A ) COLONOSCOPY WITH PROPOFOL (N/A )  Patient Location: PACU  Anesthesia Type:General  Level of Consciousness: sedated  Airway & Oxygen Therapy: Patient Spontanous Breathing and Patient connected to nasal cannula oxygen  Post-op Assessment: Report given to RN and Post -op Vital signs reviewed and stable  Post vital signs: Reviewed and stable  Last Vitals:  Vitals:   11/25/16 1311 11/25/16 1428  BP: 131/90 (P) 98/72  Pulse: 66   Resp: 16   Temp: (!) 36 C (P) 36.6 C  SpO2: 100%     Last Pain:  Vitals:   11/25/16 1428  TempSrc: (P) Tympanic         Complications: No apparent anesthesia complications

## 2016-11-25 NOTE — Anesthesia Post-op Follow-up Note (Signed)
Anesthesia QCDR form completed.        

## 2016-11-25 NOTE — H&P (Signed)
Outpatient short stay form Pre-procedure 11/25/2016 1:47 PM Travis Gray [name], [name]  Primary Physician: [name], [name]  Reason for visit:  Abdominal pain, Colon cancer screening, chronic GERD  History of present illness:  61 y/o male presents for intermittent abdominal pain in the RLQ and RUQ, constant, waxing and waning. No change in bowel habits, weight loss or rectal bleeding. Patient has chronic GERD with water brash symptoms and pyrosis completely controlled on Pantoprazole 40mg  daily.     Current Facility-Administered Medications:  .  0.9 %  sodium chloride infusion, , Intravenous, Continuous, [name], [name], MD, Last Rate: 20 mL/hr at 11/25/16 1326  Prescriptions Prior to Admission  Medication Sig Dispense Refill Last Dose  . amitriptyline (ELAVIL) 50 MG tablet TAKE 1 TABLET BY MOUTH ONCE DAILY AT BEDTIME 30 tablet 3 11/25/2016 at 0630  . atorvastatin (LIPITOR) 10 MG tablet Take 1 tablet (10 mg total) by mouth daily. 30 tablet 3 11/25/2016 at 0630  . losartan-hydrochlorothiazide (HYZAAR) 100-25 MG tablet Take 1 tablet by mouth daily. 30 tablet 3 11/25/2016 at 0630  . metoprolol succinate (TOPROL-XL) 50 MG 24 hr tablet Take 1 tablet (50 mg total) by mouth daily. Take with or immediately following a meal. 30 tablet 5 11/25/2016 at 0630  . omega-3 acid ethyl esters (LOVAZA) 1 g capsule Take 1 capsule (1 g total) by mouth daily. 90 capsule 3 Past Week at Unknown time  . pantoprazole (PROTONIX) 40 MG tablet Take 1 tablet (40 mg total) by mouth daily. 30 tablet 3 11/25/2016 at Unknown time  . vitamin B-12 (CYANOCOBALAMIN) 1000 MCG tablet Take 1,000 mcg by mouth daily.   Past Week at Unknown time  . clopidogrel (PLAVIX) 75 MG tablet Take 1 tablet (75 mg total) by mouth daily. 30 tablet 11 11/20/2016  . ranitidine (ZANTAC) 150 MG tablet Take 1 tablet (150 mg total) by mouth 2 (two) times daily. 60 tablet 3      No Known Allergies   Past Medical History:  Diagnosis Date  . Chronic  headaches   . Degenerative disc disease, cervical   . Hypercholesterolemia   . Hypertension   . Peripheral vascular disease ([place])   . Tobacco abuse     Review of systems:      Physical Exam  General appearance: alert, cooperative and appears stated age Resp: clear to auscultation bilaterally Cardio: regular rate and rhythm, S1, S2 normal, no murmur, click, rub or gallop GI: soft, non-tender; bowel sounds normal; no masses,  no organomegaly     Planned procedures: EGD and colonoscopy. The patient understands the nature of the planned procedure, indications, risks, alternatives and potential complications including but not limited to bleeding, infection, perforation, damage to internal organs and possible oversedation/side effects from anesthesia. The patient agrees and gives consent to proceed.  Please refer to procedure notes for findings, recommendations and patient disposition/instructions.     [name] [name], [name] Gastroenterology 11/25/2016  1:47 PM

## 2016-11-25 NOTE — Interval H&P Note (Signed)
History and Physical Interval Note:  11/25/2016 1:54 PM  Travis Gray  has presented today for surgery, with the diagnosis of GERD,RIGHT UPPER QUADRANT PAIN COLON CANCER SCREENING  The various methods of treatment have been discussed with the patient and family. After consideration of risks, benefits and other options for treatment, the patient has consented to  Procedure(s): ESOPHAGOGASTRODUODENOSCOPY (EGD) WITH PROPOFOL (N/A) COLONOSCOPY WITH PROPOFOL (N/A) as a surgical intervention .  The patient's history has been reviewed, patient examined, no change in status, stable for surgery.  I have reviewed the patient's chart and labs.  Questions were answered to the patient's satisfaction.     [place], [place]

## 2016-11-25 NOTE — Anesthesia Preprocedure Evaluation (Signed)
Anesthesia Evaluation  Patient identified by MRN, date of birth, ID band Patient awake    Reviewed: Allergy & Precautions, H&P , NPO status , Patient's Chart, lab work & pertinent test results  History of Anesthesia Complications Negative for: history of anesthetic complications  Airway Mallampati: III  TM Distance: <3 FB Neck ROM: limited    Dental  (+) Chipped, Missing, Caps   Pulmonary neg pulmonary ROS, neg shortness of breath, former smoker,           Cardiovascular Exercise Tolerance: Good hypertension, (-) angina+ Peripheral Vascular Disease  (-) Past MI and (-) DOE      Neuro/Psych  Headaches, negative psych ROS   GI/Hepatic negative GI ROS, Neg liver ROS, GERD  Medicated and Controlled,  Endo/Other  negative endocrine ROS  Renal/GU negative Renal ROS  negative genitourinary   Musculoskeletal  (+) Arthritis ,   Abdominal   Peds  Hematology negative hematology ROS (+)   Anesthesia Other Findings Past Medical History: No date: Chronic headaches No date: Degenerative disc disease, cervical No date: Hypercholesterolemia No date: Hypertension No date: Peripheral vascular disease ([facility]) No date: Tobacco abuse  Past Surgical History: No date: HERNIA REPAIR     Comment:  Inguinal,laparoscopic surgery 05/21/2015: PERIPHERAL VASCULAR CATHETERIZATION; Right     Comment:  Procedure: Lower Extremity Angiography;  Surgeon: Algernon Huxley, MD;  Location: [place] [facility];  Service:               Cardiovascular;  Laterality: Right; 05/21/2015: PERIPHERAL VASCULAR CATHETERIZATION     Comment:  Procedure: Lower Extremity Intervention;  Surgeon: Algernon Huxley, MD;  Location: [place] [facility];  Service:               Cardiovascular;; No date: radio-ablated therapy     Comment:  completed in the pain clinic  BMI    Body Mass Index:  25.84 kg/m      Reproductive/Obstetrics negative OB  ROS                             Anesthesia Physical Anesthesia Plan  ASA: III  Anesthesia Plan: General   Post-op Pain Management:    Induction: Intravenous  PONV Risk Score and Plan: Propofol infusion  Airway Management Planned: Natural Airway and Nasal Cannula  Additional Equipment:   Intra-op Plan:   Post-operative Plan:   Informed Consent: I have reviewed the patients History and Physical, chart, labs and discussed the procedure including the risks, benefits and alternatives for the proposed anesthesia with the patient or authorized representative who has indicated his/her understanding and acceptance.   Dental Advisory Given  Plan Discussed with: Anesthesiologist, CRNA and Surgeon  Anesthesia Plan Comments: (Patient consented for risks of anesthesia including but not limited to:  - adverse reactions to medications - risk of intubation if required - damage to teeth, lips or other oral mucosa - sore throat or hoarseness - Damage to heart, brain, lungs or loss of life  Patient voiced understanding.)        Anesthesia Quick Evaluation

## 2016-11-25 NOTE — Anesthesia Postprocedure Evaluation (Signed)
Anesthesia Post Note  Patient: Travis Gray  Procedure(s) Performed: ESOPHAGOGASTRODUODENOSCOPY (EGD) WITH PROPOFOL (N/A ) COLONOSCOPY WITH PROPOFOL (N/A )  Patient location during evaluation: Endoscopy Anesthesia Type: General Level of consciousness: awake and alert Pain management: pain level controlled Vital Signs Assessment: post-procedure vital signs reviewed and stable Respiratory status: spontaneous breathing, nonlabored ventilation, respiratory function stable and patient connected to nasal cannula oxygen Cardiovascular status: blood pressure returned to baseline and stable Postop Assessment: no apparent nausea or vomiting Anesthetic complications: no     Last Vitals:  Vitals:   11/25/16 1428 11/25/16 1438  BP: 98/72   Pulse:    Resp:  20  Temp: 36.6 C   SpO2:      Last Pain:  Vitals:   11/25/16 1428  TempSrc: Tympanic                 Precious Haws [place]

## 2016-11-25 NOTE — Op Note (Signed)
[facility] Patient Name: Travis Gray Procedure Date: 11/25/2016 1:48 PM MRN: [id number] Account #: [id number] Date of Birth: Apr 14, 1955 Admit Type: Outpatient Age: 61 Room: [facility] ENDO ROOM 3 Gender: Male Note Status: Finalized Procedure:            Colonoscopy Indications:          Screening for colorectal malignant neoplasm Providers:            [name]. Alice Reichert MD, MD Referring MD:         [name], MD (Referring MD) Medicines:            Propofol per Anesthesia Complications:        No immediate complications. Procedure:            Pre-Anesthesia Assessment:                       - The risks and benefits of the procedure and the                        sedation options and risks were discussed with the                        patient. All questions were answered and informed                        consent was obtained.                       - Patient identification and proposed procedure were                        verified prior to the procedure by the nurse. The                        procedure was verified in the procedure room.                       - ASA Grade Assessment: III - A patient with severe                        systemic disease.                       - After reviewing the risks and benefits, the patient                        was deemed in satisfactory condition to undergo the                        procedure.                       After obtaining informed consent, the colonoscope was                        passed under direct vision. Throughout the procedure,                        the patient's blood pressure, pulse, and oxygen  saturations were monitored continuously. The                        Colonoscope was introduced through the anus and                        advanced to the the cecum, identified by appendiceal                        orifice and ileocecal valve. The colonoscopy was                performed without difficulty. The patient tolerated the                        procedure well. The quality of the bowel preparation                        was good. The ileocecal valve, appendiceal orifice, and                        rectum were photographed. Findings:      The perianal and digital rectal examinations were normal. Pertinent       negatives include normal sphincter tone and no palpable rectal lesions.      Multiple small and large-mouthed diverticula were found in the sigmoid       colon. There was no evidence of diverticular bleeding.      Non-bleeding internal hemorrhoids were found during retroflexion. The       hemorrhoids were Grade I (internal hemorrhoids that do not prolapse).      The entire examined colon appeared normal.      A 4 mm polyp was found in the rectum. The polyp was sessile. The polyp       was removed with a jumbo cold forceps. Resection and retrieval were       complete.      The exam was otherwise without abnormality. Impression:           - Moderate diverticulosis in the sigmoid colon. There                        was no evidence of diverticular bleeding.                       - Non-bleeding internal hemorrhoids.                       - The entire examined colon is normal.                       - One 4 mm polyp in the rectum, removed with a jumbo                        cold forceps. Resected and retrieved.                       - The examination was otherwise normal. Recommendation:       - Patient has a contact number available for                        emergencies. The signs and symptoms of potential  delayed complications were discussed with the patient.                        Return to normal activities tomorrow. Written discharge                        instructions were provided to the patient.                       - Resume previous diet.                       - Continue present medications.                        - Await pathology results.                       - Repeat colonoscopy for surveillance based on                        pathology results.                       - Return to GI office PRN.                       - Telephone GI office for pathology results in 1 week.                       - The findings and recommendations were discussed with                        the patient and their spouse. Procedure Code(s):    --- Professional ---                       [phone], Colonoscopy, flexible; with biopsy, single or                        multiple Diagnosis Code(s):    --- Professional ---                       Z12.11, Encounter for screening for malignant neoplasm                        of colon                       K64.0, First degree hemorrhoids                       K62.1, Rectal polyp                       K57.30, Diverticulosis of large intestine without                        perforation or abscess without bleeding CPT copyright 2016 American Medical Association. All rights reserved. The codes documented in this report are preliminary and upon coder review may  be revised to meet current compliance requirements. Efrain Sella MD, MD 11/25/2016 2:27:49 PM This report has been signed electronically. Number of Addenda: 0 Note Initiated On: 11/25/2016 1:48  PM Scope Withdrawal Time: 0 hours 7 minutes 35 seconds  Total Procedure Duration: 0 hours 17 minutes 5 seconds       [facility]

## 2016-11-28 ENCOUNTER — Encounter: Payer: Self-pay | Admitting: Internal Medicine

## 2016-11-29 LAB — SURGICAL PATHOLOGY

## 2016-12-07 ENCOUNTER — Encounter: Payer: Self-pay | Admitting: Internal Medicine

## 2016-12-27 ENCOUNTER — Encounter: Payer: Self-pay | Admitting: Internal Medicine

## 2016-12-30 ENCOUNTER — Other Ambulatory Visit: Payer: Self-pay | Admitting: Internal Medicine

## 2017-01-08 ENCOUNTER — Other Ambulatory Visit: Payer: Self-pay | Admitting: Internal Medicine

## 2017-01-20 DIAGNOSIS — D2339 Other benign neoplasm of skin of other parts of face: Secondary | ICD-10-CM | POA: Diagnosis not present

## 2017-01-20 DIAGNOSIS — D485 Neoplasm of uncertain behavior of skin: Secondary | ICD-10-CM | POA: Diagnosis not present

## 2017-01-20 DIAGNOSIS — Z85828 Personal history of other malignant neoplasm of skin: Secondary | ICD-10-CM | POA: Diagnosis not present

## 2017-01-20 DIAGNOSIS — L570 Actinic keratosis: Secondary | ICD-10-CM | POA: Diagnosis not present

## 2017-01-23 ENCOUNTER — Ambulatory Visit (INDEPENDENT_AMBULATORY_CARE_PROVIDER_SITE_OTHER): Payer: 59

## 2017-01-23 ENCOUNTER — Encounter: Payer: Self-pay | Admitting: Internal Medicine

## 2017-01-23 ENCOUNTER — Ambulatory Visit: Payer: 59 | Admitting: Internal Medicine

## 2017-01-23 VITALS — BP 136/86 | HR 66 | Temp 98.5°F | Ht 69.0 in | Wt 172.6 lb

## 2017-01-23 DIAGNOSIS — R202 Paresthesia of skin: Secondary | ICD-10-CM

## 2017-01-23 DIAGNOSIS — M545 Low back pain, unspecified: Secondary | ICD-10-CM

## 2017-01-23 DIAGNOSIS — I10 Essential (primary) hypertension: Secondary | ICD-10-CM | POA: Diagnosis not present

## 2017-01-23 DIAGNOSIS — M5134 Other intervertebral disc degeneration, thoracic region: Secondary | ICD-10-CM | POA: Diagnosis not present

## 2017-01-23 DIAGNOSIS — R200 Anesthesia of skin: Secondary | ICD-10-CM

## 2017-01-23 DIAGNOSIS — M47816 Spondylosis without myelopathy or radiculopathy, lumbar region: Secondary | ICD-10-CM | POA: Diagnosis not present

## 2017-01-23 DIAGNOSIS — E7800 Pure hypercholesterolemia, unspecified: Secondary | ICD-10-CM | POA: Diagnosis not present

## 2017-01-23 DIAGNOSIS — I739 Peripheral vascular disease, unspecified: Secondary | ICD-10-CM

## 2017-01-23 DIAGNOSIS — R109 Unspecified abdominal pain: Secondary | ICD-10-CM | POA: Diagnosis not present

## 2017-01-23 DIAGNOSIS — M48061 Spinal stenosis, lumbar region without neurogenic claudication: Secondary | ICD-10-CM | POA: Diagnosis not present

## 2017-01-23 DIAGNOSIS — M5136 Other intervertebral disc degeneration, lumbar region: Secondary | ICD-10-CM | POA: Diagnosis not present

## 2017-01-23 DIAGNOSIS — M47814 Spondylosis without myelopathy or radiculopathy, thoracic region: Secondary | ICD-10-CM | POA: Diagnosis not present

## 2017-01-23 NOTE — Progress Notes (Signed)
Pre visit review using our clinic review tool, if applicable. No additional management support is needed unless otherwise documented below in the visit note. 

## 2017-01-23 NOTE — Progress Notes (Signed)
Patient ID: CALAHAN PAK, male   DOB: [birth year], 61 y.o.   MRN: [id number]   Subjective:    Patient ID: YUVAAN OLANDER, male    DOB: 24-Apr-1955, 61 y.o.   MRN: [id number]  HPI  Patient here as a work in appt with concerns regarding right hand (finger) numbness.  More localized to his fingers.  States has noticed for a while.  Worsened over the last 6 months.  Notices at various times now.  Does notice at night and in the morning - when wakes up. Also notices various times throughout the day.  States may occur 4-5x/day.  Shakes his hand and symptoms will improve.  Has noticed dropping some things at times.  Also notices a change in sensation in his right mid arm - elbow and forearm.  No increased neck pain or shoulder pain.  States neck and shoulder issues appear to be better.  No chest pain or sob reported.  No increased headache.  Does report intermittent back and right side pain.  Will notice in his mid to lower back and then radiates around to right side and right mid abdomen.  Had recent abdominal CT.  Unrevealing.  Was doing a lot of physical activity a couple of days ago and noticed it flared.  Better today.  No pain going down legs.  No numbness/tingling in lower extremities.  No nausea or vomiting.  No bowel change.  Eating and drinking well.     Past Medical History:  Diagnosis Date  . Chronic headaches   . Degenerative disc disease, cervical   . Hypercholesterolemia   . Hypertension   . Peripheral vascular disease ([place])   . Tobacco abuse    Past Surgical History:  Procedure Laterality Date  . COLONOSCOPY WITH PROPOFOL N/A 11/25/2016   Procedure: COLONOSCOPY WITH PROPOFOL;  Surgeon: Toledo, [name], MD;  Location: [facility] ENDOSCOPY;  Service: Endoscopy;  Laterality: N/A;  . ESOPHAGOGASTRODUODENOSCOPY (EGD) WITH PROPOFOL N/A 11/25/2016   Procedure: ESOPHAGOGASTRODUODENOSCOPY (EGD) WITH PROPOFOL;  Surgeon: Toledo, [name], MD;  Location: [facility] ENDOSCOPY;  Service: Endoscopy;   Laterality: N/A;  . HERNIA REPAIR     Inguinal,laparoscopic surgery  . PERIPHERAL VASCULAR CATHETERIZATION Right 05/21/2015   Procedure: Lower Extremity Angiography;  Surgeon: Algernon Huxley, MD;  Location: [place] [facility];  Service: Cardiovascular;  Laterality: Right;  . PERIPHERAL VASCULAR CATHETERIZATION  05/21/2015   Procedure: Lower Extremity Intervention;  Surgeon: Algernon Huxley, MD;  Location: [place] [facility];  Service: Cardiovascular;;  . radio-ablated therapy     completed in the pain clinic   Family History  Problem Relation Age of Onset  . Hypertension Mother   . Hyperlipidemia Father   . Cancer Paternal Grandfather   . Diabetes Maternal Grandmother    Social History   Socioeconomic History  . Marital status: Married    Spouse name: None  . Number of children: None  . Years of education: None  . Highest education level: None  Social Needs  . Financial resource strain: None  . Food insecurity - worry: None  . Food insecurity - inability: None  . Transportation needs - medical: None  . Transportation needs - non-medical: None  Occupational History  . None  Tobacco Use  . Smoking status: Former Smoker    Packs/day: 1.00    Years: 37.00    Pack years: 37.00    Types: Cigarettes    Last attempt to quit: 05/25/2015    Years since quitting:  1.6  . Smokeless tobacco: Never Used  Substance and Sexual Activity  . Alcohol use: Yes    Alcohol/week: 0.0 oz    Comment: very occasional alcohol use  . Drug use: No  . Sexual activity: None  Other Topics Concern  . None  Social History Narrative  . None    Outpatient Encounter Medications as of 01/23/2017  Medication Sig  . amitriptyline (ELAVIL) 50 MG tablet TAKE 1 TABLET BY MOUTH ONCE DAILY AT BEDTIME  . atorvastatin (LIPITOR) 10 MG tablet Take 1 tablet (10 mg total) by mouth daily.  Marland Kitchen atorvastatin (LIPITOR) 10 MG tablet TAKE 1 TABLET BY MOUTH ONCE DAILY  . clopidogrel (PLAVIX) 75 MG tablet Take 1 tablet (75 mg  total) by mouth daily.  Marland Kitchen losartan-hydrochlorothiazide (HYZAAR) 100-25 MG tablet Take 1 tablet by mouth daily.  Marland Kitchen losartan-hydrochlorothiazide (HYZAAR) 100-25 MG tablet TAKE 1 TABLET BY MOUTH ONCE DAILY  . metoprolol succinate (TOPROL-XL) 50 MG 24 hr tablet Take 1 tablet (50 mg total) by mouth daily. Take with or immediately following a meal.  . metoprolol succinate (TOPROL-XL) 50 MG 24 hr tablet TAKE 1 TABLET BY MOUTH ONCE DAILY  . omega-3 acid ethyl esters (LOVAZA) 1 g capsule Take 1 capsule (1 g total) by mouth daily.  . pantoprazole (PROTONIX) 40 MG tablet Take 1 tablet (40 mg total) by mouth daily.  . ranitidine (ZANTAC) 150 MG tablet Take 1 tablet (150 mg total) by mouth 2 (two) times daily.  . vitamin B-12 (CYANOCOBALAMIN) 1000 MCG tablet Take 1,000 mcg by mouth daily.   No facility-administered encounter medications on file as of 01/23/2017.     Review of Systems  Constitutional: Negative for appetite change and unexpected weight change.  HENT: Negative for congestion and sinus pressure.   Respiratory: Negative for chest tightness and shortness of breath.   Cardiovascular: Negative for chest pain and leg swelling.  Gastrointestinal: Negative for abdominal pain, diarrhea, nausea and vomiting.  Musculoskeletal: Positive for back pain. Negative for joint swelling.       Right finger numbness and tingling as outlined.  Right arm discomfort as outlined.    Skin: Negative for color change and rash.  Neurological: Negative for dizziness and headaches.  Psychiatric/Behavioral: Negative for agitation and dysphoric mood.       Objective:     Blood pressure rechecked by me:  134-136/84-86  Physical Exam  Constitutional: He appears well-developed and well-nourished. No distress.  HENT:  Nose: Nose normal.  Mouth/Throat: Oropharynx is clear and moist.  Neck: Neck supple.  Cardiovascular: Normal rate and regular rhythm.  Pulmonary/Chest: Effort normal and breath sounds normal. No  respiratory distress.  Abdominal: Soft. Bowel sounds are normal. There is no tenderness.  Musculoskeletal: He exhibits no edema or tenderness.  Grip strength appears to be normal and equal - bilateral.  Minimal sensation change to palpation over the forearm and mid arm.  Good rom - shoulder and neck with no increased pain.  Negative Tinels.  Positive Phalens.    Lymphadenopathy:    He has no cervical adenopathy.  Skin: No rash noted. No erythema.  Psychiatric: He has a normal mood and affect. His behavior is normal.    BP 136/86   Pulse 66   Temp 98.5 F (36.9 C) (Oral)   Ht 5\' 9"  (1.753 m)   Wt 172 lb 9.6 oz (78.3 kg)   SpO2 95%   BMI 25.49 kg/m  Wt Readings from Last 3 Encounters:  01/23/17 172 lb 9.6  oz (78.3 kg)  11/25/16 175 lb (79.4 kg)  08/22/16 171 lb 3.2 oz (77.7 kg)     Lab Results  Component Value Date   WBC 6.8 08/22/2016   HGB 13.6 08/22/2016   HCT 39.5 08/22/2016   PLT 216.0 08/22/2016   GLUCOSE 84 08/22/2016   CHOL 198 09/15/2015   TRIG (H) 09/15/2015    648.0 Triglyceride is over 400; calculations on Lipids are invalid.   HDL 24.40 (L) 09/15/2015   LDLDIRECT 55.0 09/15/2015   LDLCALC 60 04/24/2013   ALT 14 08/22/2016   AST 20 08/22/2016   NA 140 08/22/2016   K 3.7 08/22/2016   CL 101 08/22/2016   CREATININE 1.01 08/22/2016   BUN 16 08/22/2016   CO2 32 08/22/2016   TSH 2.52 12/11/2014   PSA 2.03 08/22/2016   INR 1.09 05/24/2015   HGBA1C 5.6 06/18/2013       Assessment & Plan:   Problem List Items Addressed This Visit    Abdominal pain    Right side pain - occurs with radiation from his back.  No pain to palpation.  No GI symptoms.  CT abdomen with no acute intraabdominal abnormality.  Discussed possible MSK origin.  Will monitor if worse with increased physical activity.  Check thoracic and L-S spine xray.        Back pain    Back and side pain as outlined.  Discussed possible MSK origin.  Previous CT abdomen as outlined.  Check xrays as  outlined.        Relevant Orders   DG Thoracic Spine 2 View (Completed)   DG Lumbar Spine 2-3 Views (Completed)   Essential hypertension, benign    Blood pressure on recheck improved.  Continue same medication regimen.  Have him follow pressures.  Follow metabolic panel.        Relevant Orders   TSH   Basic metabolic panel   Peripheral vascular disease ([place])    S/p stents.  Followed by vascular surgery.  Doing well.        Pure hypercholesterolemia    On lipitor.  Low cholesterol diet and exercise.  Follow lipid panel and liver function tests.        Relevant Orders   Lipid panel   Hepatic function panel   Right arm numbness    Persistent/worsening symptoms as outlined.  Exam as outlined.  Discussed possible etiologies.  Neck and shoulder better.  Check NCS.  Discussed splint.        Relevant Orders   Ambulatory referral to Neurology    Other Visit Diagnoses    Right sided numbness    -  Primary   Relevant Orders   DG Thoracic Spine 2 View (Completed)   DG Lumbar Spine 2-3 Views (Completed)       [name], MD

## 2017-01-24 ENCOUNTER — Encounter: Payer: Self-pay | Admitting: Internal Medicine

## 2017-01-24 NOTE — Assessment & Plan Note (Signed)
Back and side pain as outlined.  Discussed possible MSK origin.  Previous CT abdomen as outlined.  Check xrays as outlined.

## 2017-01-24 NOTE — Assessment & Plan Note (Signed)
S/p stents.  Followed by vascular surgery.  Doing well.

## 2017-01-24 NOTE — Assessment & Plan Note (Signed)
Right side pain - occurs with radiation from his back.  No pain to palpation.  No GI symptoms.  CT abdomen with no acute intraabdominal abnormality.  Discussed possible MSK origin.  Will monitor if worse with increased physical activity.  Check thoracic and L-S spine xray.

## 2017-01-24 NOTE — Assessment & Plan Note (Signed)
Blood pressure on recheck improved.  Continue same medication regimen.  Have him follow pressures.  Follow metabolic panel.

## 2017-01-24 NOTE — Assessment & Plan Note (Signed)
On lipitor.  Low cholesterol diet and exercise.  Follow lipid panel and liver function tests.   

## 2017-01-24 NOTE — Assessment & Plan Note (Signed)
Persistent/worsening symptoms as outlined.  Exam as outlined.  Discussed possible etiologies.  Neck and shoulder better.  Check NCS.  Discussed splint.

## 2017-01-25 ENCOUNTER — Other Ambulatory Visit: Payer: Self-pay | Admitting: Internal Medicine

## 2017-01-25 DIAGNOSIS — M5442 Lumbago with sciatica, left side: Secondary | ICD-10-CM

## 2017-01-25 DIAGNOSIS — M546 Pain in thoracic spine: Secondary | ICD-10-CM

## 2017-01-25 NOTE — Progress Notes (Signed)
Orders placed for ortho referral.

## 2017-01-31 DIAGNOSIS — G5601 Carpal tunnel syndrome, right upper limb: Secondary | ICD-10-CM | POA: Diagnosis not present

## 2017-02-03 ENCOUNTER — Encounter: Payer: Self-pay | Admitting: Internal Medicine

## 2017-02-03 ENCOUNTER — Telehealth: Payer: Self-pay

## 2017-02-03 DIAGNOSIS — D485 Neoplasm of uncertain behavior of skin: Secondary | ICD-10-CM

## 2017-02-03 NOTE — Telephone Encounter (Signed)
Copied from [place] [id number]. Topic: Inquiry >> Feb 03, 2017  9:54 AM Pricilla Handler wrote: Reason for CRM: Patient would like for Dr. Nicki Reaper to call him ASAP. Patient has been diagnosed with Muir-Torre Syndrome. Patient request a call from Dr. Nicki Reaper ASAP. [phone] (M)      Thank You!!!

## 2017-02-03 NOTE — Telephone Encounter (Signed)
I do not mind calling pt, but Please call and let pt know that I am seeing pts.  I also need more information.  Need to know what dermatologist he saw and I need records from dermatology asap.

## 2017-02-04 NOTE — Telephone Encounter (Signed)
Called pt and discussed Muir-Torre syndrome.  He has had recent colonoscopy and EGD.  Also urine 07/2016 - no blood.  Given concern raised over this syndrome, will refer him to oncology for further evaluation and question of need for further testing.  He is in agreement.  Also will obtain records.  He will fax over results and will get office notes from Dr Tyler Deis.

## 2017-02-07 ENCOUNTER — Other Ambulatory Visit: Payer: Self-pay | Admitting: Internal Medicine

## 2017-02-08 NOTE — Telephone Encounter (Signed)
Have you seen this information that his wife faxed?  Also, please send a form for him to sign so that we can get a copy of Dr Bonna Gains records.  Please fax to him and he will fax back.

## 2017-02-09 NOTE — Telephone Encounter (Signed)
Ok.  The paper work was to send to oncology.  He will sign the medical release form and then we need to send to Dr Rowland Lathe office for records.

## 2017-02-09 NOTE — Telephone Encounter (Signed)
Yes, paper work is in [place] from his wife. I have faxed ROI to him for him to sign and fax back.

## 2017-02-10 NOTE — Telephone Encounter (Signed)
Spoke to patient. He said it would probably be Monday before his wife faxed release of information back to our office.

## 2017-02-14 ENCOUNTER — Encounter: Payer: Self-pay | Admitting: Internal Medicine

## 2017-02-14 DIAGNOSIS — G5601 Carpal tunnel syndrome, right upper limb: Secondary | ICD-10-CM | POA: Insufficient documentation

## 2017-02-14 NOTE — Telephone Encounter (Signed)
Please refax a blank release of information form to [phone]

## 2017-02-14 NOTE — Telephone Encounter (Signed)
refaxed paper, sent my chart message.

## 2017-02-15 DIAGNOSIS — G5601 Carpal tunnel syndrome, right upper limb: Secondary | ICD-10-CM | POA: Diagnosis not present

## 2017-02-16 NOTE — Telephone Encounter (Signed)
Spoke with patient to be sure paper has been faxed back. Waiting for return

## 2017-02-17 NOTE — Telephone Encounter (Signed)
Noted  

## 2017-02-17 NOTE — Telephone Encounter (Signed)
Spoke with patients wife. She did receive our fax and has faxed back. We have not received. Patients wife stated that she would re-fax on Monday and if it did not work he could come by the office and sign release.

## 2017-02-20 NOTE — Telephone Encounter (Signed)
Patient came by and signed ROI form. Will fax to [facility] for office notes.

## 2017-02-24 ENCOUNTER — Telehealth: Payer: Self-pay

## 2017-02-24 NOTE — Telephone Encounter (Signed)
faxed pathology report to Dr. Rogue Bussing.

## 2017-02-27 ENCOUNTER — Inpatient Hospital Stay: Payer: 59 | Attending: Internal Medicine | Admitting: Internal Medicine

## 2017-02-27 ENCOUNTER — Encounter: Payer: Self-pay | Admitting: Internal Medicine

## 2017-02-27 VITALS — BP 138/88 | HR 66 | Temp 98.0°F | Wt 174.4 lb

## 2017-02-27 DIAGNOSIS — Z87891 Personal history of nicotine dependence: Secondary | ICD-10-CM

## 2017-02-27 DIAGNOSIS — I739 Peripheral vascular disease, unspecified: Secondary | ICD-10-CM

## 2017-02-27 DIAGNOSIS — G8929 Other chronic pain: Secondary | ICD-10-CM

## 2017-02-27 DIAGNOSIS — Z9889 Other specified postprocedural states: Secondary | ICD-10-CM

## 2017-02-27 DIAGNOSIS — S0990XA Unspecified injury of head, initial encounter: Secondary | ICD-10-CM

## 2017-02-27 DIAGNOSIS — Z803 Family history of malignant neoplasm of breast: Secondary | ICD-10-CM | POA: Diagnosis not present

## 2017-02-27 DIAGNOSIS — Z8052 Family history of malignant neoplasm of bladder: Secondary | ICD-10-CM | POA: Diagnosis not present

## 2017-02-27 DIAGNOSIS — I10 Essential (primary) hypertension: Secondary | ICD-10-CM | POA: Diagnosis not present

## 2017-02-27 DIAGNOSIS — Z79899 Other long term (current) drug therapy: Secondary | ICD-10-CM

## 2017-02-27 DIAGNOSIS — R51 Headache: Secondary | ICD-10-CM

## 2017-02-27 DIAGNOSIS — M5030 Other cervical disc degeneration, unspecified cervical region: Secondary | ICD-10-CM

## 2017-02-27 DIAGNOSIS — G4730 Sleep apnea, unspecified: Secondary | ICD-10-CM

## 2017-02-27 DIAGNOSIS — R1011 Right upper quadrant pain: Secondary | ICD-10-CM

## 2017-02-27 DIAGNOSIS — D485 Neoplasm of uncertain behavior of skin: Secondary | ICD-10-CM

## 2017-02-27 DIAGNOSIS — E7800 Pure hypercholesterolemia, unspecified: Secondary | ICD-10-CM | POA: Diagnosis not present

## 2017-02-27 DIAGNOSIS — Z800 Family history of malignant neoplasm of digestive organs: Secondary | ICD-10-CM

## 2017-02-27 DIAGNOSIS — H2640 Unspecified secondary cataract: Secondary | ICD-10-CM

## 2017-02-27 DIAGNOSIS — Z8719 Personal history of other diseases of the digestive system: Secondary | ICD-10-CM

## 2017-02-27 DIAGNOSIS — H269 Unspecified cataract: Secondary | ICD-10-CM | POA: Insufficient documentation

## 2017-02-27 NOTE — Assessment & Plan Note (Addendum)
#  Given the presence of sebaceous adenoma/also significant family history of multiple family members with multiple malignancies-suspicious for Lynch syndrome-related malignancy.  Recommend genetic counseling/evaluation.  #Chronic abdominal pain for the last 1-2 years.  Extensive workup including CT August 2018; EGD colonoscopy negative.  #We will make a referral to genetic counseling; patient follow-up on March 5 in [place] to review the results of the genetic workup.  Thank you Dr. Nicki Reaper for the referral and allowing me to participate in the care of your pleasant patient.   Cc; Dr.Scott.

## 2017-02-27 NOTE — Progress Notes (Signed)
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     HISTORY OF PRESENTING ILLNESS:  Travis Gray 62 y.o.  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   More recently he had a lesion taken off his left nasal crease-which was found to be sebaceous adenoma.  This led to the possibility/consideration for further workup/counseling regarding any genetic basis of malignancy.   Patient had been having chronic abdominal pain mostly right upper quadrant for many years; he has had extensive workup including CT of the chest and pelvis summer 2018.  He also had a EGD colonoscopy that was negative.  ROS: A complete 10 point review of system is done which is negative except mentioned above in history of present illness  MEDICAL HISTORY:  Past Medical History:  Diagnosis Date  . Chronic headaches   . Degenerative disc disease, cervical   . Hypercholesterolemia   . Hypertension   . Peripheral vascular disease ([place])   . Tobacco abuse     SURGICAL HISTORY: Past Surgical History:  Procedure Laterality Date  . COLONOSCOPY WITH PROPOFOL N/A 11/25/2016   Procedure: COLONOSCOPY WITH PROPOFOL;  Surgeon: Toledo, [name], MD;  Location: [facility] END[facility]COPY;  Service: Endoscopy;  Laterality: N/A;  . ESOPHAGOGASTRODUODENOSCOPY (EGD) WITH PROPOFOL N/A 11/25/2016   Procedure: ESOPHAGOGASTRODUODENOSCOPY (EGD) WITH PROPOFOL;  Surgeon: Toledo, [name], MD;  Location: [facility] ENDOSCOPY;  Service: Endoscopy;  Laterality: N/A;  . HERNIA REPAIR      Inguinal,laparoscopic surgery  . PERIPHERAL VASCULAR CATHETERIZATION Right 05/21/2015   Procedure: Lower Extremity Angiography;  Surgeon: Algernon Huxley, MD;  Location: [facility];  Service: Cardiovascular;  Laterality: Right;  . PERIPHERAL VASCULAR CATHETERIZATION  05/21/2015   Procedure: Lower Extremity Intervention;  Surgeon: Algernon Huxley, MD;  Location: [facility];  Service: Cardiovascular;;  . 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  Social History   Socioeconomic History  . Marital status: Married    Spouse name: Not on file  . Number of children: Not on file  . Years of education: Not on file  . Highest education level: Not on file  Social Needs  . Financial resource strain: Not on file  . Food insecurity - worry: Not on file  . Food insecurity - inability: Not on file  . Transportation needs - medical: Not on file  . Transportation needs - non-medical: Not on file  Occupational History  . Not on file  Tobacco Use  . Smoking status: Former Smoker    Packs/day: 1.00    Years: 37.00    Pack years: 37.00    Types: Cigarettes    Last attempt to quit: 05/25/2015    Years since quitting: 1.7  . Smokeless tobacco: Never Used  Substance and Sexual Activity  . Alcohol use: Yes    Alcohol/week: 0.0 oz    Comment: very occasional alcohol use  . Drug use: No  .  Sexual activity: Not on file  Other Topics Concern  . Not on file  Social History Narrative  . Not on file    FAMILY HISTORY: pat grandadad- in 41s- bladder ca; mom- breast cancer- 7y; 1 sister- younger- [58y]- no cancer; 2 childersn- 67 daughtre; son 18; father crohns disase; pat aunt- stomach cancer [25 yeats- in mid 21s] Family History  Problem Relation Age of Onset  . Hypertension Mother   . Breast cancer Mother 30  . Uterine cancer Mother        unconfirmed; dx 62s; TAH/BSO;  currently 53  . Hyperlipidemia Father   . Bladder Cancer Paternal Grandfather 97       smoker; deceased 29  . Diabetes Maternal Grandmother   . Non-Hodgkin's lymphoma Paternal Aunt        deceased 20    ALLERGIES:  has No Known Allergies.  MEDICATIONS:  Current Outpatient Medications  Medication Sig Dispense Refill  . amitriptyline (ELAVIL) 50 MG tablet TAKE 1 TABLET BY MOUTH AT BEDTIME 30 tablet 3  . atorvastatin (LIPITOR) 10 MG tablet Take 1 tablet (10 mg total) by mouth daily. 30 tablet 3  . clopidogrel (PLAVIX) 75 MG tablet Take 1 tablet (75 mg total) by mouth daily. 30 tablet 11  . losartan-hydrochlorothiazide (HYZAAR) 100-25 MG tablet Take 1 tablet by mouth daily. 30 tablet 3  . metoprolol succinate (TOPROL-XL) 50 MG 24 hr tablet Take 1 tablet (50 mg total) by mouth daily. Take with or immediately following a meal. 30 tablet 5  . omega-3 acid ethyl esters (LOVAZA) 1 g capsule Take 1 capsule (1 g total) by mouth daily. 90 capsule 3  . pantoprazole (PROTONIX) 40 MG tablet Take 1 tablet (40 mg total) by mouth daily. 30 tablet 3  . vitamin B-12 (CYANOCOBALAMIN) 1000 MCG tablet Take 1,000 mcg by mouth daily.    Marland Kitchen atorvastatin (LIPITOR) 10 MG tablet TAKE 1 TABLET BY MOUTH ONCE DAILY (Patient not taking: Reported on 02/27/2017) 30 tablet 3  . losartan-hydrochlorothiazide (HYZAAR) 100-25 MG tablet TAKE 1 TABLET BY MOUTH ONCE DAILY (Patient not taking: Reported on 02/27/2017) 30 tablet 3  . metoprolol succinate (TOPROL-XL) 50 MG 24 hr tablet TAKE 1 TABLET BY MOUTH ONCE DAILY (Patient not taking: Reported on 02/27/2017) 30 tablet 5  . ranitidine (ZANTAC) 150 MG tablet Take 1 tablet (150 mg total) by mouth 2 (two) times daily. (Patient not taking: Reported on 02/27/2017) 60 tablet 3   No current facility-administered medications for this visit.       Marland Kitchen  PHYSICAL EXAMINATION: ECOG PERFORMANCE STATUS: 0 - Asymptomatic  Vitals:   02/27/17 1501  BP: 138/88  Pulse: 66  Temp: 98 F (36.7 C)   SpO2: 97%   Filed Weights   02/27/17 1501  Weight: 174 lb 6.4 oz (79.1 kg)    GENERAL: Well-nourished well-developed; Alert, no distress and comfortable.   With family.  EYES: no pallor or icterus OROPHARYNX: no thrush or ulceration; good dentition  NECK: supple, no masses felt LYMPH:  no palpable lymphadenopathy in the cervical, axillary or inguinal regions LUNGS: clear to auscultation and  No wheeze or crackles HEART/CVS: regular rate & rhythm and no murmurs; No lower extremity edema ABDOMEN: abdomen soft, non-tender and normal bowel sounds Musculoskeletal:no cyanosis of digits and no clubbing  PSYCH: alert & oriented x 3 with fluent speech NEURO: no focal motor/sensory deficits SKIN:  no rashes or significant lesions  LABORATORY DATA:  I have reviewed the data as listed Lab Results  Component Value Date   WBC 6.8 08/22/2016   HGB 13.6 08/22/2016   HCT 39.5 08/22/2016   MCV 94.2 08/22/2016   PLT 216.0 08/22/2016   Recent Labs    08/22/16 1600  NA 140  K 3.7  CL 101  CO2 32  GLUCOSE 84  BUN 16  CREATININE 1.01  CALCIUM 9.4  PROT 6.9  ALBUMIN 4.6  AST 20  ALT 14  ALKPHOS 65  BILITOT 0.6  BILIDIR 0.1    RADIOGRAPHIC STUDIES: I have personally reviewed the radiological images as listed and agreed with the findings in the report. No results found.  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  Recommend genetic counseling/evaluation.  #Chronic abdominal pain for the last 1-2 years.  Extensive workup including CT August 2018; EGD colonoscopy negative.  #We will make a referral to genetic counseling; patient follow-up on March 5 in [place] to review the results of the genetic workup.  Thank you Dr. Nicki Reaper for the referral and allowing me to participate in the care of your pleasant patient.    Cc; Dr.Scott.   All questions were answered. The patient knows to call the clinic with any problems, questions or concerns.  # 45 minutes face-to-face with the patient discussing the above plan of care; more than 50% of time spent on prognosis/ natural history; counseling and coordination.     Cammie Sickle, MD 03/07/2017 11:24 AM

## 2017-03-01 ENCOUNTER — Telehealth: Payer: Self-pay | Admitting: Genetic Counselor

## 2017-03-01 DIAGNOSIS — G5601 Carpal tunnel syndrome, right upper limb: Secondary | ICD-10-CM | POA: Diagnosis not present

## 2017-03-01 NOTE — Telephone Encounter (Signed)
Dr. Rogue Bussing is referring Travis Gray for genetic counseling due to a personal history of a sebaceous adenoma and family history of cancer. I spoke with him today and he chose to schedule this telegenetics visit to be done by phone on Monday, 03/06/17 at 8:30am.   Steele Berg, [place], [place] Genetic Counselor Phone: [phone]

## 2017-03-06 ENCOUNTER — Encounter: Payer: Self-pay | Admitting: Genetic Counselor

## 2017-03-06 ENCOUNTER — Telehealth: Payer: Self-pay | Admitting: Genetic Counselor

## 2017-03-06 DIAGNOSIS — D239 Other benign neoplasm of skin, unspecified: Secondary | ICD-10-CM | POA: Insufficient documentation

## 2017-03-06 NOTE — Telephone Encounter (Signed)
Cancer Genetics            Telegenetics Initial Visit    Patient Name: Travis Gray Patient DOB: Jun 23, 1955 Patient Age: 62 y.o. Phone Call Date: 03/06/2017  Referring Provider: Charlaine Dalton, MD  Reason for Visit: Evaluate for hereditary susceptibility to cancer    Assessment and Plan:  . Travis Gray' history of a sebaceous adenoma is suggestive of a hereditary predisposition to cancer, specifically Lynch syndrome (Muir-Torre variant). Any sebaceous neoplasm (benign or otherwise) requires genetic evaluation. It is unclear whether his mother truly had uterine cancer or not. She had no siblings and may be why more Lynch-associated cancers are not seen in his family.  . Testing is recommended to determine whether he has a pathogenic mutation that will impact his screening and risk-reduction for cancer. A negative result will be reassuring.  . Travis Gray wished to pursue genetic testing and will schedule a lab visit for a blood sample. Analysis will include the 83 genes on [facility]'s Multi-Cancer panel (ALK, APC, ATM, AXIN2, BAP1, BARD1, BLM, BMPR1A, BRCA1, BRCA2, BRIP1, CASR, CDC73, CDH1, CDK4, CDKN1B, CDKN1C, CDKN2A, CEBPA, CHEK2, CTNNA1, DICER1, DIS3L2, EGFR, EPCAM, FH, FLCN, GATA2, GPC3, GREM1, HOXB13, HRAS, KIT, MAX, MEN1, MET, MITF, MLH1, MSH2, MSH3, MSH6, MUTYH, NBN, NF1, NF2, NTHL1, PALB2, PDGFRA, PHOX2B, PMS2, POLD1, POLE, POT1, PRKAR1A, PTCH1, PTEN, RAD50, RAD51C, RAD51D, RB1, RECQL4, RET, RUNX1, SDHA, SDHAF2, SDHB, SDHC, SDHD, SMAD4, SMARCA4, SMARCB1, SMARCE1, STK11, SUFU, TERC, TERT, TMEM127, TP53, TSC1, TSC2, VHL, WRN, WT1).  . Results should be available in approximately 2-3 weeks, at which point we will contact him and address implications for him as well as address genetic testing for at-risk family members, if needed.     Dr. Grayland Ormond was available for questions concerning this case. Total time spent by counseling by phone was approximately 30 minutes.     _____________________________________________________________________   History of Present Illness: Travis Gray, a 62 y.o. male, was referred for genetic counseling to discuss the possibility of a hereditary predisposition to cancer (Muir-Torre) and discuss whether genetic testing is warranted. This was a telegenetics visit via phone.  Travis Gray was recently found to have one sebaceous adenoma in his nasal crease. 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  A colonoscopy in 11/2016 revealed one 30m tubular adenoma in the rectum which was removed. He states that a colonoscopy around age [age]revealed 1-2 polyps, but he does not recall the type.  Past Medical History:  Diagnosis Date  . Chronic headaches   . Degenerative disc disease, cervical   . Hypercholesterolemia   . Hypertension   . Peripheral vascular disease ([place]   . Tobacco abuse     Past Surgical History:  Procedure Laterality Date  . COLONOSCOPY WITH PROPOFOL N/A 11/25/2016   Procedure: COLONOSCOPY WITH PROPOFOL;  Surgeon: Toledo, TBenay Pike MD;  Location: [facility];  Service: Endoscopy;  Laterality: N/A;  . ESOPHAGOGASTRODUODENOSCOPY (EGD) WITH PROPOFOL N/A 11/25/2016   Procedure: ESOPHAGOGASTRODUODENOSCOPY (EGD) WITH PROPOFOL;  Surgeon: Toledo, TBenay Pike MD;  Location: [facility];  Service: Endoscopy;  Laterality: N/A;  . HERNIA REPAIR     Inguinal,laparoscopic surgery  . PERIPHERAL VASCULAR CATHETERIZATION Right 05/21/2015   Procedure: Lower Extremity Angiography;  Surgeon: JAlgernon Huxley MD;  [facility];  Service: Cardiovascular;  Laterality: Right;  . PERIPHERAL VASCULAR CATHETERIZATION  05/21/2015  Procedure: Lower Extremity Intervention;  Surgeon: Algernon Huxley, MD;  Location: [facility];  Service: Cardiovascular;;  . radio-ablated therapy     completed in the pain clinic    Family History: Significant diagnoses include the  following:  Family History  Problem Relation Age of Onset  . Hypertension Mother   . Breast cancer Mother 14  . Uterine cancer Mother        unconfirmed; dx 61s; TAH/BSO; currently 33  . Hyperlipidemia Father   . Bladder Cancer Paternal Grandfather 46       smoker; deceased 64  . Diabetes Maternal Grandmother   . Non-Hodgkin's lymphoma Paternal Aunt        deceased 44    Additionally, Travis Gray has a daughter (age 47) and a son (age 19). He has one full sister (age 42) and one maternal half-brother (76). He does not know whether they have a history of polyps. His mother (noted above) is an only child. His father died at 65, cancer-free. He had a sister and brother.  Travis Gray ancestry is Caucasian - NOS. There is no known Jewish ancestry and no consanguinity.  Discussion: We reviewed the characteristics, features and inheritance patterns of hereditary cancer syndromes. We discussed his risk of harboring a mutation in the context of his personal and family history. We discussed that his small maternal family and makes risk assessment challenging and it is not clear whether his mother had uterine cancer or not. We discussed the process of genetic testing, insurance coverage and implications of results: positive, negative and variant of unknown significance (VUS).   Travis Gray questions were answered to his satisfaction today and he is welcome to call with any additional questions or concerns. Thank you for the referral and allowing [place] to share in the care of your patient.    Steele Berg, MS, [place] Certified Genetic Counselor phone: [phone]

## 2017-03-07 ENCOUNTER — Inpatient Hospital Stay: Payer: 59

## 2017-03-07 DIAGNOSIS — D239 Other benign neoplasm of skin, unspecified: Secondary | ICD-10-CM | POA: Diagnosis not present

## 2017-03-07 DIAGNOSIS — Z803 Family history of malignant neoplasm of breast: Secondary | ICD-10-CM | POA: Diagnosis not present

## 2017-03-07 DIAGNOSIS — Z86010 Personal history of colonic polyps: Secondary | ICD-10-CM | POA: Diagnosis not present

## 2017-03-20 ENCOUNTER — Telehealth: Payer: Self-pay | Admitting: Genetic Counselor

## 2017-03-20 ENCOUNTER — Encounter: Payer: Self-pay | Admitting: Genetic Counselor

## 2017-03-20 DIAGNOSIS — Z1379 Encounter for other screening for genetic and chromosomal anomalies: Secondary | ICD-10-CM

## 2017-03-20 HISTORY — DX: Encounter for other screening for genetic and chromosomal anomalies: Z13.79

## 2017-03-20 NOTE — Telephone Encounter (Signed)
Cancer Genetics             Telegenetics Results Disclosure   Patient Name: ZIMRI BRENNEN Patient DOB: 26-May-1955 Patient Age: 62 y.o. Phone Call Date: 03/20/2017  Referring Provider: Charlaine Dalton, MD    Mr. Pheasant was called today to discuss genetic test results. Please see the Genetics telephone note from 03/06/2017 for a detailed discussion of his personal and family histories and the recommendations provided.  Genetic Testing: At the time of Mr. Cedano's telegenetics visit, he decided to pursue genetic testing of multiple genes associated with hereditary susceptibility to cancer. Testing included sequencing and deletion/duplication analysis. Testing did not reveal any pathogenic mutation in any of the genes analyzed.  A copy of the genetic test report will be scanned into Epic under the Media tab.  The genes analyzed were the 83 genes on [facility]'s Multi-Cancer panel (ALK, APC, ATM, AXIN2, BAP1, BARD1, BLM, BMPR1A, BRCA1, BRCA2, BRIP1, CASR, CDC73, CDH1, CDK4, CDKN1B, CDKN1C, CDKN2A, CEBPA, CHEK2, CTNNA1, DICER1, DIS3L2, EGFR, EPCAM, FH, FLCN, GATA2, GPC3, GREM1, HOXB13, HRAS, KIT, MAX, MEN1, MET, MITF, MLH1, MSH2, MSH3, MSH6, MUTYH, NBN, NF1, NF2, NTHL1, PALB2, PDGFRA, PHOX2B, PMS2, POLD1, POLE, POT1, PRKAR1A, PTCH1, PTEN, RAD50, RAD51C, RAD51D, RB1, RECQL4, RET, RUNX1, SDHA, SDHAF2, SDHB, SDHC, SDHD, SMAD4, SMARCA4, SMARCB1, SMARCE1, STK11, SUFU, TERC, TERT, TMEM127, TP53, TSC1, TSC2, VHL, WRN, WT1).  Since the current test is not perfect, it is possible that there may be a gene mutation that current testing cannot detect, but that chance is small. It is possible that a different genetic factor, which has not yet been discovered or is not on this panel, is responsible for the cancer diagnoses in the family. Again, the likelihood of this is low. No additional testing is recommended at this time for Mr. Jawad.  Two Variants of Uncertain Significance were  detected: NF2 c.685G>T (p.Gly229Cys) and RECQL4 c.2836C>T (p.Arg946Cys). This is still considered a normal result. While at this time, it is unknown if this finding is associated with increased cancer risk, the majority of these variants get reclassified to be inconsequential. Medical management should not be based on this finding. With time, we suspect the lab will determine the significance, if any. If we do learn more about it, we will try to contact Mr. Likes to discuss it further. It is important to stay in touch with [place] periodically and keep the address and phone number up to date.  Cancer Screening: These results suggest that Mr. Zazueta's sebaceous adenoma was most likely not due to an inherited predisposition, and specifically not due to Lynch syndrome (Muir-Torre). He is recommended to follow the cancer screening guidelines provided by his physician.   Family Members: Family members may be at some increased risk of developing cancer, over the general population risk, simply due to the family history. They are recommended to speak with their own providers about appropriate cancer screenings.  Any relative who had cancer at a young age or had a particularly rare cancer may also wish to pursue genetic testing. Genetic counselors can be located in other cities, by visiting the website of the [employer] ([employer]ist[employer]) and Field seismologist for a Dietitian by zip code.   Family members are not recommended to get tested for the above VUSs outside of a research protocol as this finding has no implications for their medical management.  Lastly,  cancer genetics is a rapidly advancing field and it is possible that new genetic tests will be appropriate for Mr. Dubey in the future. We encourage Mr. Dains to remain in contact with Genetics on an annual basis so his personal and family histories can be updated.    Steele Berg, MS, [place] Certified Genetic Counselor phone:  [phone]

## 2017-03-21 ENCOUNTER — Encounter: Payer: Self-pay | Admitting: Oncology

## 2017-03-28 ENCOUNTER — Other Ambulatory Visit: Payer: Self-pay

## 2017-03-28 ENCOUNTER — Inpatient Hospital Stay: Payer: 59 | Attending: Internal Medicine | Admitting: Internal Medicine

## 2017-03-28 VITALS — BP 156/94 | HR 61 | Temp 97.0°F | Resp 18 | Wt 175.5 lb

## 2017-03-28 DIAGNOSIS — I739 Peripheral vascular disease, unspecified: Secondary | ICD-10-CM | POA: Diagnosis not present

## 2017-03-28 DIAGNOSIS — D239 Other benign neoplasm of skin, unspecified: Secondary | ICD-10-CM

## 2017-03-28 DIAGNOSIS — M5030 Other cervical disc degeneration, unspecified cervical region: Secondary | ICD-10-CM | POA: Diagnosis not present

## 2017-03-28 DIAGNOSIS — Z87891 Personal history of nicotine dependence: Secondary | ICD-10-CM | POA: Insufficient documentation

## 2017-03-28 DIAGNOSIS — E7800 Pure hypercholesterolemia, unspecified: Secondary | ICD-10-CM | POA: Diagnosis not present

## 2017-03-28 DIAGNOSIS — Z79899 Other long term (current) drug therapy: Secondary | ICD-10-CM

## 2017-03-28 DIAGNOSIS — D2330 Other benign neoplasm of skin of unspecified part of face: Secondary | ICD-10-CM

## 2017-03-28 DIAGNOSIS — Z803 Family history of malignant neoplasm of breast: Secondary | ICD-10-CM | POA: Insufficient documentation

## 2017-03-28 DIAGNOSIS — I10 Essential (primary) hypertension: Secondary | ICD-10-CM | POA: Diagnosis not present

## 2017-03-28 DIAGNOSIS — R51 Headache: Secondary | ICD-10-CM | POA: Insufficient documentation

## 2017-03-28 DIAGNOSIS — Z800 Family history of malignant neoplasm of digestive organs: Secondary | ICD-10-CM | POA: Insufficient documentation

## 2017-03-28 NOTE — Progress Notes (Signed)
[facility] CONSULT NOTE  Patient Care Team: [name], MD as PCP - General (Internal Medicine)  CHIEF COMPLAINTS/PURPOSE OF CONSULTATION: "Travis Gray syndrome"  # Feb 2019- Sebceous adenoma -genetic testing-negative for Lynch [Ofri]; VUS unknown significance **-normal variant  # PVD [setent dr.dew]; no cad;   No history exists.     HISTORY OF PRESENTING ILLNESS:  Travis Gray 62 y.o.  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   Given the association of sebaceous adenoma- [name] who was referred to genetic counseling/had genetic testing done.  Is here to review the results.    ROS: A complete 10 point review of system is done which is negative except mentioned above in history of present illness  MEDICAL HISTORY:  Past Medical History:  Diagnosis Date  . Chronic headaches   . Degenerative disc disease, cervical   . Genetic testing 03/20/2017   Multi-Cancer panel (83 genes) @ [facility] - No pathogenic mutations detected  . Hypercholesterolemia   . Hypertension   . Peripheral vascular disease ([place])   . Tobacco abuse     SURGICAL HISTORY: Past Surgical History:  Procedure Laterality Date  . COLONOSCOPY WITH PROPOFOL N/A 11/25/2016   Procedure: COLONOSCOPY WITH PROPOFOL;  Surgeon: Toledo, [name], MD;  Location: [facility] END[facility]COPY;  Service: Endoscopy;  Laterality: N/A;  . ESOPHAGOGASTRODUODENOSCOPY (EGD) WITH PROPOFOL N/A 11/25/2016   Procedure: ESOPHAGOGASTRODUODENOSCOPY (EGD) WITH PROPOFOL;  Surgeon: Toledo, [name], MD;  Location: [facility] ENDOSCOPY;  Service: Endoscopy;  Laterality: N/A;  . HERNIA REPAIR     Inguinal,laparoscopic surgery  . PERIPHERAL VASCULAR CATHETERIZATION Right 05/21/2015   Procedure: Lower Extremity Angiography;  Surgeon: Algernon Huxley, MD;   Location: [place] [facility];  Service: Cardiovascular;  Laterality: Right;  . PERIPHERAL VASCULAR CATHETERIZATION  05/21/2015   Procedure: Lower Extremity Intervention;  Surgeon: Algernon Huxley, MD;  Location: [place] [facility];  Service: Cardiovascular;;  . 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  Social History   Socioeconomic History  . Marital status: Married    Spouse name: Not on file  . Number of children: Not on file  . Years of education: Not on file  . Highest education level: Not on file  Social Needs  . Financial resource strain: Not on file  . Food insecurity - worry: Not on file  . Food insecurity - inability: Not on file  . Transportation needs - medical: Not on file  . Transportation needs - non-medical: Not on file  Occupational History  . Not on file  Tobacco Use  . Smoking status: Former Smoker    Packs/day: 1.00    Years: 37.00    Pack years: 37.00    Types: Cigarettes    Last attempt to quit: 05/25/2015    Years since quitting: 1.8  . Smokeless tobacco: Never Used  Substance and Sexual Activity  . Alcohol use: Yes    Alcohol/week: 0.0 oz    Comment: very occasional alcohol use  . Drug use: No  . Sexual activity: Not on file  Other Topics Concern  . Not on file  Social History Narrative  . Not on file    FAMILY HISTORY: pat grandadad- in 28s-  bladder ca; mom- breast cancer- 59y; 1 sister- younger- [58y]- no cancer; 2 childersn- 68 daughtre; son 61; father crohns disase; pat aunt- stomach cancer [25 yeats- in mid 34s] Family History  Problem Relation Age of Onset  . Hypertension Mother   . Breast cancer Mother 6  . Uterine cancer Mother        unconfirmed; dx 60s; TAH/BSO; currently 61  . Hyperlipidemia Father   . Bladder Cancer Paternal Grandfather 82       smoker; deceased 45  . Diabetes Maternal Grandmother   . Non-Hodgkin's  lymphoma Paternal Aunt        deceased 72    ALLERGIES:  has No Known Allergies.  MEDICATIONS:  Current Outpatient Medications  Medication Sig Dispense Refill  . amitriptyline (ELAVIL) 50 MG tablet TAKE 1 TABLET BY MOUTH AT BEDTIME 30 tablet 3  . atorvastatin (LIPITOR) 10 MG tablet Take 1 tablet (10 mg total) by mouth daily. 30 tablet 3  . clopidogrel (PLAVIX) 75 MG tablet Take 1 tablet (75 mg total) by mouth daily. 30 tablet 11  . losartan-hydrochlorothiazide (HYZAAR) 100-25 MG tablet Take 1 tablet by mouth daily. 30 tablet 3  . metoprolol succinate (TOPROL-XL) 50 MG 24 hr tablet Take 1 tablet (50 mg total) by mouth daily. Take with or immediately following a meal. 30 tablet 5  . omega-3 acid ethyl esters (LOVAZA) 1 g capsule Take 1 capsule (1 g total) by mouth daily. 90 capsule 3  . pantoprazole (PROTONIX) 40 MG tablet Take 1 tablet (40 mg total) by mouth daily. 30 tablet 3  . vitamin B-12 (CYANOCOBALAMIN) 1000 MCG tablet Take 1,000 mcg by mouth daily.    Marland Kitchen HYDROcodone-acetaminophen (NORCO/VICODIN) 5-325 MG tablet Take by mouth.     No current facility-administered medications for this visit.       Marland Kitchen  PHYSICAL EXAMINATION: ECOG PERFORMANCE STATUS: 0 - Asymptomatic  Vitals:   03/28/17 1018  BP: (!) 156/94  Pulse: 61  Resp: 18  Temp: (!) 97 F (36.1 C)   Filed Weights   03/28/17 1018  Weight: 175 lb 7.8 oz (79.6 kg)    GENERAL: Well-nourished well-developed; Alert, no distress and comfortable.  He is alone. EYES: no pallor or icterus OROPHARYNX: no thrush or ulceration; good dentition  NECK: supple, no masses felt LYMPH:  no palpable lymphadenopathy in the cervical, axillary or inguinal regions LUNGS: clear to auscultation and  No wheeze or crackles HEART/CVS: regular rate & rhythm and no murmurs; No lower extremity edema ABDOMEN: abdomen soft, non-tender and normal bowel sounds Musculoskeletal:no cyanosis of digits and no clubbing  PSYCH: alert & oriented x 3 with  fluent speech NEURO: no focal motor/sensory deficits SKIN:  no rashes or significant lesions  LABORATORY DATA:  I have reviewed the data as listed Lab Results  Component Value Date   WBC 6.8 08/22/2016   HGB 13.6 08/22/2016   HCT 39.5 08/22/2016   MCV 94.2 08/22/2016   PLT 216.0 08/22/2016   Recent Labs    08/22/16 1600  NA 140  K 3.7  CL 101  CO2 32  GLUCOSE 84  BUN 16  CREATININE 1.01  CALCIUM 9.4  PROT 6.9  ALBUMIN 4.6  AST 20  ALT 14  ALKPHOS 65  BILITOT 0.6  BILIDIR 0.1    RADIOGRAPHIC STUDIES: I have personally reviewed the radiological images as listed and agreed with the findings in the report. No results found.  ASSESSMENT & PLAN:   Sebaceous adenoma #Given the presence  of sebaceous adenoma/also significant family history of multiple family members with multiple malignancies-suspicious for Lynch syndrome-related malignancy.   #However genetic testing-negative for Lynch syndrome; VUS of unknown significance noted.  This is likely benign abnormality.  #Recommend continued age appropriate screening with PCP.  #No further follow-ups recommended at the cancer center.   Cc; Dr.Scott.   All questions were answered. The patient knows to call the clinic with any problems, questions or concerns.  # 45 minutes face-to-face with the patient discussing the above plan of care; more than 50% of time spent on prognosis/ natural history; counseling and coordination.     Cammie Sickle, MD 03/28/2017 2:48 PM

## 2017-03-28 NOTE — Progress Notes (Signed)
Here for follow up. Doing well. Stated he had carpel tunnel release surg Feb/19- doing well w this.

## 2017-03-28 NOTE — Assessment & Plan Note (Signed)
#  Given the presence of sebaceous adenoma/also significant family history of multiple family members with multiple malignancies-suspicious for Lynch syndrome-related malignancy.   #However genetic testing-negative for Lynch syndrome; VUS of unknown significance noted.  This is likely benign abnormality.  #Recommend continued age appropriate screening with PCP.  #No further follow-ups recommended at the cancer center.   Cc; Dr.Scott.

## 2017-04-04 ENCOUNTER — Encounter: Payer: Self-pay | Admitting: Oncology

## 2017-05-02 ENCOUNTER — Encounter (INDEPENDENT_AMBULATORY_CARE_PROVIDER_SITE_OTHER): Payer: Self-pay | Admitting: Vascular Surgery

## 2017-05-02 ENCOUNTER — Encounter (INDEPENDENT_AMBULATORY_CARE_PROVIDER_SITE_OTHER): Payer: Self-pay

## 2017-05-02 ENCOUNTER — Ambulatory Visit (INDEPENDENT_AMBULATORY_CARE_PROVIDER_SITE_OTHER): Payer: 59

## 2017-05-02 ENCOUNTER — Ambulatory Visit (INDEPENDENT_AMBULATORY_CARE_PROVIDER_SITE_OTHER): Payer: 59 | Admitting: Vascular Surgery

## 2017-05-02 VITALS — BP 142/94 | HR 57 | Resp 16 | Ht 69.0 in | Wt 174.8 lb

## 2017-05-02 DIAGNOSIS — E7800 Pure hypercholesterolemia, unspecified: Secondary | ICD-10-CM

## 2017-05-02 DIAGNOSIS — I10 Essential (primary) hypertension: Secondary | ICD-10-CM

## 2017-05-02 DIAGNOSIS — I739 Peripheral vascular disease, unspecified: Secondary | ICD-10-CM | POA: Diagnosis not present

## 2017-05-02 NOTE — Progress Notes (Signed)
MRN : [id number]  Travis Gray is a 62 y.o. (1955-09-11) male who presents with chief complaint of  Chief Complaint  Patient presents with  . Follow-up    39yr abi  .  History of Present Illness: Patient returns today in follow up of PAD.  He is about 2 years status post bilateral iliac stents as well as left SFA intervention for lifestyle limiting claudication.  He is doing well.  He currently has no lifestyle limiting claudication, ischemic rest pain, or ulceration. His ABIs today are normal at 1.2 bilaterally with brisk normal waveforms consistent with no arterial insufficiency and patent interventions.  Current Outpatient Medications  Medication Sig Dispense Refill  . amitriptyline (ELAVIL) 50 MG tablet TAKE 1 TABLET BY MOUTH AT BEDTIME 30 tablet 3  . atorvastatin (LIPITOR) 10 MG tablet Take 1 tablet (10 mg total) by mouth daily. 30 tablet 3  . clopidogrel (PLAVIX) 75 MG tablet Take 1 tablet (75 mg total) by mouth daily. 30 tablet 11  . losartan-hydrochlorothiazide (HYZAAR) 100-25 MG tablet Take 1 tablet by mouth daily. 30 tablet 3  . metoprolol succinate (TOPROL-XL) 50 MG 24 hr tablet Take 1 tablet (50 mg total) by mouth daily. Take with or immediately following a meal. 30 tablet 5  . omega-3 acid ethyl esters (LOVAZA) 1 g capsule Take 1 capsule (1 g total) by mouth daily. 90 capsule 3  . pantoprazole (PROTONIX) 40 MG tablet Take 1 tablet (40 mg total) by mouth daily. 30 tablet 3  . vitamin B-12 (CYANOCOBALAMIN) 1000 MCG tablet Take 1,000 mcg by mouth daily.    Marland Kitchen HYDROcodone-acetaminophen (NORCO/VICODIN) 5-325 MG tablet Take by mouth.     No current facility-administered medications for this visit.     Past Medical History:  Diagnosis Date  . Chronic headaches   . Degenerative disc disease, cervical   . Genetic testing 03/20/2017   Multi-Cancer panel (83 genes) @ [facility] - No pathogenic mutations detected  . Hypercholesterolemia   . Hypertension   . Peripheral vascular  disease ([place])   . Tobacco abuse     Past Surgical History:  Procedure Laterality Date  . COLONOSCOPY WITH PROPOFOL N/A 11/25/2016   Procedure: COLONOSCOPY WITH PROPOFOL;  Surgeon: Toledo, [name], MD;  Location: [facility];  Service: Endoscopy;  Laterality: N/A;  . ESOPHAGOGASTRODUODENOSCOPY (EGD) WITH PROPOFOL N/A 11/25/2016   Procedure: ESOPHAGOGASTRODUODENOSCOPY (EGD) WITH PROPOFOL;  Surgeon: Toledo, [name], MD;  Location: [facility];  Service: Endoscopy;  Laterality: N/A;  . HERNIA REPAIR     Inguinal,laparoscopic surgery  . PERIPHERAL VASCULAR CATHETERIZATION Right 05/21/2015   Procedure: Lower Extremity Angiography;  Surgeon: Algernon Huxley, MD;  Location: [place] [facility];  Service: Cardiovascular;  Laterality: Right;  . PERIPHERAL VASCULAR CATHETERIZATION  05/21/2015   Procedure: Lower Extremity Intervention;  Surgeon: Algernon Huxley, MD;  Location: [place] [facility];  Service: Cardiovascular;;  . radio-ablated therapy     completed in the pain clinic    Social History Social History   Tobacco Use  . Smoking status: Former Smoker    Packs/day: 1.00    Years: 37.00    Pack years: 37.00    Types: Cigarettes    Last attempt to quit: 05/25/2015    Years since quitting: 1.9  . Smokeless tobacco: Never Used  Substance Use Topics  . Alcohol use: Yes    Alcohol/week: 0.0 oz    Comment: very occasional alcohol use  . Drug use: No  Family History Family History  Problem Relation Age of Onset  . Hypertension Mother   . Breast cancer Mother 64  . Uterine cancer Mother        unconfirmed; dx 3s; TAH/BSO; currently 69  . Hyperlipidemia Father   . Bladder Cancer Paternal Grandfather 38       smoker; deceased 4  . Diabetes Maternal Grandmother   . Non-Hodgkin's lymphoma Paternal Aunt        deceased 101     No Known Allergies   REVIEW OF SYSTEMS (Negative unless checked)  Constitutional: [] Weight loss  [] Fever  [] Chills Cardiac: [] Chest pain    [] Chest pressure   [] Palpitations   [] Shortness of breath when laying flat   [] Shortness of breath at rest   [] Shortness of breath with exertion. Vascular:  [x] Pain in legs with walking   [] Pain in legs at rest   [] Pain in legs when laying flat   [] Claudication   [] Pain in feet when walking  [] Pain in feet at rest  [] Pain in feet when laying flat   [] History of DVT   [] Phlebitis   [] Swelling in legs   [] Varicose veins   [] Non-healing ulcers Pulmonary:   [] Uses home oxygen   [] Productive cough   [] Hemoptysis   [] Wheeze  [] COPD   [] Asthma Neurologic:  [] Dizziness  [] Blackouts   [] Seizures   [] History of stroke   [] History of TIA  [] Aphasia   [] Temporary blindness   [] Dysphagia   [] Weakness or numbness in arms   [] Weakness or numbness in legs Musculoskeletal:  [x] Arthritis   [] Joint swelling   [] Joint pain   [] Low back pain Hematologic:  [] Easy bruising  [] Easy bleeding   [] Hypercoagulable state   [] Anemic   Gastrointestinal:  [] Blood in stool   [] Vomiting blood  [] Gastroesophageal reflux/heartburn   [] Abdominal pain Genitourinary:  [] Chronic kidney disease   [] Difficult urination  [] Frequent urination  [] Burning with urination   [] Hematuria Skin:  [] Rashes   [] Ulcers   [] Wounds Psychological:  [] History of anxiety   []  History of major depression.  Physical Examination  BP (!) 142/94 (BP Location: Right Arm)   Pulse (!) 57   Resp 16   Ht 5\' 9"  (1.753 m)   Wt 79.3 kg (174 lb 12.8 oz)   BMI 25.81 kg/m  Gen:  WD/WN, NAD Head: [place]/AT, No temporalis wasting. Ear/Nose/Throat: Hearing grossly intact, nares w/o erythema or drainage Eyes: Conjunctiva clear. Sclera non-icteric Neck: Supple.  Trachea midline Pulmonary:  Good air movement, no use of accessory muscles.  Cardiac: RRR, no JVD Vascular:  Vessel Right Left  Radial Palpable Palpable                          PT Palpable Palpable  DP Palpable Palpable     Musculoskeletal: M/S 5/5 throughout.  No deformity or atrophy.  No lower  extremity edema. Neurologic: Sensation grossly intact in extremities.  Symmetrical.  Speech is fluent.  Psychiatric: Judgment intact, Mood & affect appropriate for pt's clinical situation. Dermatologic: No rashes or ulcers noted.  No cellulitis or open wounds.       Labs No results found for this or any previous visit (from the past 2160 hour(s)).  Radiology No results found.  Assessment/Plan  Essential hypertension, benign blood pressure control important in reducing the progression of atherosclerotic disease. On appropriate oral medications.   Pure hypercholesterolemia lipid control important in reducing the progression of atherosclerotic disease. Continue statin therapy  Peripheral vascular disease ([place]) His ABIs today are normal at 1.2 bilaterally with brisk normal waveforms consistent with no arterial insufficiency and patent interventions. Doing well.  Continue Plavix and Lipitor.  Recheck in 1 year    Leotis Pain, MD  05/02/2017 11:12 AM    This note was created with Dragon medical transcription system.  Any errors from dictation are purely unintentional

## 2017-05-02 NOTE — Assessment & Plan Note (Signed)
His ABIs today are normal at 1.2 bilaterally with brisk normal waveforms consistent with no arterial insufficiency and patent interventions. Doing well.  Continue Plavix and Lipitor.  Recheck in 1 year

## 2017-05-02 NOTE — Assessment & Plan Note (Signed)
blood pressure control important in reducing the progression of atherosclerotic disease. On appropriate oral medications.  

## 2017-05-02 NOTE — Assessment & Plan Note (Signed)
lipid control important in reducing the progression of atherosclerotic disease. Continue statin therapy  

## 2017-05-02 NOTE — Patient Instructions (Signed)
Peripheral Vascular Disease Peripheral vascular disease (PVD) is a disease of the blood vessels that are not part of your heart and brain. A simple term for PVD is poor circulation. In most cases, PVD narrows the blood vessels that carry blood from your heart to the rest of your body. This can result in a decreased supply of blood to your arms, legs, and internal organs, like your stomach or kidneys. However, it most often affects a person's lower legs and feet. There are two types of PVD.  Organic PVD. This is the more common type. It is caused by damage to the structure of blood vessels.  Functional PVD. This is caused by conditions that make blood vessels contract and tighten (spasm).  Without treatment, PVD tends to get worse over time. PVD can also lead to acute ischemic limb. This is when an arm or limb suddenly has trouble getting enough blood. This is a medical emergency. What are the causes? Each type of PVD has many different causes. The most common cause of PVD is buildup of a fatty material (plaque) inside of your arteries (atherosclerosis). Small amounts of plaque can break off from the walls of the blood vessels and become lodged in a smaller artery. This blocks blood flow and can cause acute ischemic limb. Other common causes of PVD include:  Blood clots that form inside of blood vessels.  Injuries to blood vessels.  Diseases that cause inflammation of blood vessels or cause blood vessel spasms.  Health behaviors and health history that increase your risk of developing PVD.  What increases the risk? You may have a greater risk of PVD if you:  Have a family history of PVD.  Have certain medical conditions, including: ? High cholesterol. ? Diabetes. ? High blood pressure (hypertension). ? Coronary heart disease. ? Past problems with blood clots. ? Past injury, such as burns or a broken bone. These may have damaged blood vessels in your limbs. ? Buerger disease. This is  caused by inflamed blood vessels in your hands and feet. ? Some forms of arthritis. ? Rare birth defects that affect the arteries in your legs.  Use tobacco.  Do not get enough exercise.  Are obese.  Are age 50 or older.  What are the signs or symptoms? PVD may cause many different symptoms. Your symptoms depend on what part of your body is not getting enough blood. Some common signs and symptoms include:  Cramps in your lower legs. This may be a symptom of poor leg circulation (claudication).  Pain and weakness in your legs while you are physically active that goes away when you rest (intermittent claudication).  Leg pain when at rest.  Leg numbness, tingling, or weakness.  Coldness in a leg or foot, especially when compared with the other leg.  Skin or hair changes. These can include: ? Hair loss. ? Shiny skin. ? Pale or bluish skin. ? Thick toenails.  Inability to get or maintain an erection (erectile dysfunction).  People with PVD are more prone to developing ulcers and sores on their toes, feet, or legs. These may take longer than normal to heal. How is this diagnosed? Your health care provider may diagnose PVD from your signs and symptoms. The health care provider will also do a physical exam. You may have tests to find out what is causing your PVD and determine its severity. Tests may include:  Blood pressure recordings from your arms and legs and measurements of the strength of your pulses (  pulse volume recordings).  Imaging studies using sound waves to take pictures of the blood flow through your blood vessels (Doppler ultrasound).  Injecting a dye into your blood vessels before having imaging studies using: ? X-rays (angiogram or arteriogram). ? Computer-generated X-rays (CT angiogram). ? A powerful electromagnetic field and a computer (magnetic resonance angiogram or MRA).  How is this treated? Treatment for PVD depends on the cause of your condition and the  severity of your symptoms. It also depends on your age. Underlying causes need to be treated and controlled. These include long-lasting (chronic) conditions, such as diabetes, high cholesterol, and high blood pressure. You may need to first try making lifestyle changes and taking medicines. Surgery may be needed if these do not work. Lifestyle changes may include:  Quitting smoking.  Exercising regularly.  Following a low-fat, low-cholesterol diet.  Medicines may include:  Blood thinners to prevent blood clots.  Medicines to improve blood flow.  Medicines to improve your blood cholesterol levels.  Surgical procedures may include:  A procedure that uses an inflated balloon to open a blocked artery and improve blood flow (angioplasty).  A procedure to put in a tube (stent) to keep a blocked artery open (stent implant).  Surgery to reroute blood flow around a blocked artery (peripheral bypass surgery).  Surgery to remove dead tissue from an infected wound on the affected limb.  Amputation. This is surgical removal of the affected limb. This may be necessary in cases of acute ischemic limb that are not improved through medical or surgical treatments.  Follow these instructions at home:  Take medicines only as directed by your health care provider.  Do not use any tobacco products, including cigarettes, chewing tobacco, or electronic cigarettes. If you need help quitting, ask your health care provider.  Lose weight if you are overweight, and maintain a healthy weight as directed by your health care provider.  Eat a diet that is low in fat and cholesterol. If you need help, ask your health care provider.  Exercise regularly. Ask your health care provider to suggest some good activities for you.  Use compression stockings or other mechanical devices as directed by your health care provider.  Take good care of your feet. ? Wear comfortable shoes that fit well. ? Check your feet  often for any cuts or sores. Contact a health care provider if:  You have cramps in your legs while walking.  You have leg pain when you are at rest.  You have coldness in a leg or foot.  Your skin changes.  You have erectile dysfunction.  You have cuts or sores on your feet that are not healing. Get help right away if:  Your arm or leg turns cold and blue.  Your arms or legs become red, warm, swollen, painful, or numb.  You have chest pain or trouble breathing.  You suddenly have weakness in your face, arm, or leg.  You become very confused or lose the ability to speak.  You suddenly have a very bad headache or lose your vision. This information is not intended to replace advice given to you by your health care provider. Make sure you discuss any questions you have with your health care provider. Document Released: 02/18/2004 Document Revised: 06/18/2015 Document Reviewed: 06/20/2013 Elsevier Interactive Patient Education  2017 [employer]  

## 2017-05-31 ENCOUNTER — Other Ambulatory Visit (INDEPENDENT_AMBULATORY_CARE_PROVIDER_SITE_OTHER): Payer: Self-pay | Admitting: Vascular Surgery

## 2017-05-31 ENCOUNTER — Other Ambulatory Visit: Payer: Self-pay | Admitting: Internal Medicine

## 2017-06-09 ENCOUNTER — Telehealth: Payer: Self-pay | Admitting: *Deleted

## 2017-06-09 DIAGNOSIS — Z87891 Personal history of nicotine dependence: Secondary | ICD-10-CM

## 2017-06-09 DIAGNOSIS — Z122 Encounter for screening for malignant neoplasm of respiratory organs: Secondary | ICD-10-CM

## 2017-06-09 NOTE — Telephone Encounter (Signed)
Notified patient that annual lung cancer screening low dose CT scan is due currently or will be in near future. Confirmed that patient is within the age range of 55-77, and asymptomatic, (no signs or symptoms of lung cancer). Patient denies illness that would prevent curative treatment for lung cancer if found. Verified smoking history, (former, quit 2017, 37 pack year). The shared decision making visit was done 05/24/16. Patient is agreeable for CT scan being scheduled.

## 2017-06-13 ENCOUNTER — Ambulatory Visit
Admission: RE | Admit: 2017-06-13 | Discharge: 2017-06-13 | Disposition: A | Discharge status: Home / Self Care | Payer: 59 | Source: Ambulatory Visit | Attending: Oncology | Admitting: Oncology

## 2017-06-13 DIAGNOSIS — Z122 Encounter for screening for malignant neoplasm of respiratory organs: Secondary | ICD-10-CM | POA: Diagnosis not present

## 2017-06-13 DIAGNOSIS — J439 Emphysema, unspecified: Secondary | ICD-10-CM | POA: Diagnosis not present

## 2017-06-13 DIAGNOSIS — Z87891 Personal history of nicotine dependence: Secondary | ICD-10-CM | POA: Diagnosis not present

## 2017-06-16 ENCOUNTER — Telehealth: Payer: Self-pay | Admitting: *Deleted

## 2017-06-16 NOTE — Telephone Encounter (Signed)
Is this someone that Dr Genevive Bi would see and follow, or do you just think 3 month f/u scan at this point and go from there?

## 2017-06-16 NOTE — Telephone Encounter (Signed)
Notified patient of LDCT lung cancer screening program results with recommendation for 3 month follow up imaging. Also notified of incidental findings noted below and is encouraged to discuss further with PCP who will receive a copy of this note and/or the CT report. Patient verbalizes understanding.   IMPRESSION: Lung-RADS 4B, suspicious. Additional imaging evaluation or consultation with Pulmonology or Thoracic Surgery recommended.  Three new right upper lobe nodules measuring up to 8.7 mm. Dominant nodule it is still likely beneath size threshold for PET sensitivity. Follow up low-dose chest CT without contrast in 3 months (please use the following order, "CT CHEST LCS NODULE FOLLOW-UP W/O CM") is recommended.  Emphysema (ICD10-J43.9).

## 2017-06-21 ENCOUNTER — Other Ambulatory Visit: Payer: Self-pay | Admitting: Internal Medicine

## 2017-06-22 ENCOUNTER — Telehealth: Payer: Self-pay | Admitting: Internal Medicine

## 2017-06-22 NOTE — Telephone Encounter (Signed)
-----   Message from Lieutenant Diego, RN sent at 06/22/2017  8:36 AM EDT ----- Regarding: RE: f/u for CT  Just reviewed it with Dr. Genevive Bi, he said that the largest spot does look potentially malignant but given that he has other nodules a 3 month follow up Ct would be good. By then we should be able to see which, if any of the nodules are growing and have a better idea of what treatment will be best for Mr. Nakama.  ----- Message ----- From: [name], MD Sent: 06/21/2017  11:57 AM To: Lieutenant Diego, RN Subject: RE: f/u for CT                                 Thank you.    [name] ----- Message ----- From: Lieutenant Diego, RN Sent: 06/21/2017  11:27 AM To: [name], MD Subject: f/u for CT                                     I'll have Dr. Genevive Bi review Mr. Porzio' scan tomorrow and let you know what he thinks. shawn

## 2017-06-26 NOTE — Telephone Encounter (Signed)
Yes.  Needs to schedule f/u appt.

## 2017-06-26 NOTE — Telephone Encounter (Signed)
Pt has not been seen since 12/2016. OK for 30 day rx if appt is scheduled?

## 2017-07-17 ENCOUNTER — Other Ambulatory Visit: Payer: Self-pay | Admitting: Internal Medicine

## 2017-08-22 ENCOUNTER — Other Ambulatory Visit: Payer: Self-pay | Admitting: Internal Medicine

## 2017-08-28 ENCOUNTER — Telehealth: Payer: Self-pay | Admitting: *Deleted

## 2017-08-28 NOTE — Telephone Encounter (Signed)
Attempted to contact patient r/t CT Screening due at this time.  No answer and unable to leave message at this time.      

## 2017-08-29 ENCOUNTER — Telehealth: Payer: Self-pay | Admitting: *Deleted

## 2017-08-29 NOTE — Telephone Encounter (Signed)
Patient called r/t CT screening of lung due at this time.  Patient voiced understanding and agreement with CT screening.  Appointment sent to scheduling for future appointment.    

## 2017-08-30 ENCOUNTER — Telehealth: Payer: Self-pay | Admitting: *Deleted

## 2017-08-30 DIAGNOSIS — R918 Other nonspecific abnormal finding of lung field: Secondary | ICD-10-CM

## 2017-08-30 DIAGNOSIS — Z87891 Personal history of nicotine dependence: Secondary | ICD-10-CM

## 2017-08-30 DIAGNOSIS — Z122 Encounter for screening for malignant neoplasm of respiratory organs: Secondary | ICD-10-CM

## 2017-08-30 NOTE — Telephone Encounter (Signed)
Patient has been notified that lung cancer screening low dose CT scan follow up imaging is due currently or will be in near future. Patient is agreeable to scheduling.

## 2017-09-11 ENCOUNTER — Other Ambulatory Visit: Payer: Self-pay | Admitting: Internal Medicine

## 2017-09-14 ENCOUNTER — Encounter: Payer: Self-pay | Admitting: *Deleted

## 2017-09-14 ENCOUNTER — Ambulatory Visit
Admission: RE | Admit: 2017-09-14 | Discharge: 2017-09-14 | Disposition: A | Discharge status: Home / Self Care | Payer: 59 | Source: Ambulatory Visit | Attending: Nurse Practitioner | Admitting: Nurse Practitioner

## 2017-09-14 DIAGNOSIS — I2510 Atherosclerotic heart disease of native coronary artery without angina pectoris: Secondary | ICD-10-CM | POA: Diagnosis not present

## 2017-09-14 DIAGNOSIS — I700 Atherosclerosis of aorta: Secondary | ICD-10-CM | POA: Insufficient documentation

## 2017-09-14 DIAGNOSIS — Z122 Encounter for screening for malignant neoplasm of respiratory organs: Secondary | ICD-10-CM | POA: Insufficient documentation

## 2017-09-14 DIAGNOSIS — J432 Centrilobular emphysema: Secondary | ICD-10-CM | POA: Insufficient documentation

## 2017-09-14 DIAGNOSIS — J438 Other emphysema: Secondary | ICD-10-CM | POA: Diagnosis not present

## 2017-09-14 DIAGNOSIS — Z87891 Personal history of nicotine dependence: Secondary | ICD-10-CM | POA: Diagnosis not present

## 2017-09-14 DIAGNOSIS — R918 Other nonspecific abnormal finding of lung field: Secondary | ICD-10-CM | POA: Diagnosis not present

## 2017-09-14 DIAGNOSIS — J8410 Pulmonary fibrosis, unspecified: Secondary | ICD-10-CM | POA: Diagnosis not present

## 2017-09-18 ENCOUNTER — Other Ambulatory Visit: Payer: Self-pay | Admitting: Internal Medicine

## 2017-10-26 ENCOUNTER — Encounter: Payer: Self-pay | Admitting: Internal Medicine

## 2017-10-26 ENCOUNTER — Ambulatory Visit: Payer: 59 | Admitting: Internal Medicine

## 2017-10-26 VITALS — BP 124/70 | HR 82 | Temp 98.0°F | Resp 18 | Ht 69.0 in | Wt 174.0 lb

## 2017-10-26 DIAGNOSIS — I700 Atherosclerosis of aorta: Secondary | ICD-10-CM

## 2017-10-26 DIAGNOSIS — K219 Gastro-esophageal reflux disease without esophagitis: Secondary | ICD-10-CM | POA: Diagnosis not present

## 2017-10-26 DIAGNOSIS — I10 Essential (primary) hypertension: Secondary | ICD-10-CM | POA: Diagnosis not present

## 2017-10-26 DIAGNOSIS — R519 Headache, unspecified: Secondary | ICD-10-CM

## 2017-10-26 DIAGNOSIS — Z23 Encounter for immunization: Secondary | ICD-10-CM

## 2017-10-26 DIAGNOSIS — H539 Unspecified visual disturbance: Secondary | ICD-10-CM

## 2017-10-26 DIAGNOSIS — R51 Headache: Secondary | ICD-10-CM

## 2017-10-26 DIAGNOSIS — I739 Peripheral vascular disease, unspecified: Secondary | ICD-10-CM

## 2017-10-26 DIAGNOSIS — D649 Anemia, unspecified: Secondary | ICD-10-CM

## 2017-10-26 DIAGNOSIS — Z0000 Encounter for general adult medical examination without abnormal findings: Secondary | ICD-10-CM

## 2017-10-26 DIAGNOSIS — E7800 Pure hypercholesterolemia, unspecified: Secondary | ICD-10-CM

## 2017-10-26 DIAGNOSIS — Z125 Encounter for screening for malignant neoplasm of prostate: Secondary | ICD-10-CM

## 2017-10-26 MED ORDER — AMITRIPTYLINE HCL 25 MG PO TABS: 25.0000 mg | ORAL_TABLET | Freq: Every day | ORAL | 1 refills | Status: DC

## 2017-10-26 NOTE — Progress Notes (Signed)
Patient ID: Travis Gray, male   DOB: [birth year], 62 y.o.   MRN: [id number]   Subjective:    Patient ID: Travis Gray, male    DOB: Jul 02, 1955, 62 y.o.   MRN: [id number]  HPI  Patient here for his physical exam.  States he is doing relatively well.  His biggest concern is with his eyes.  Has noticed some change in vision (peripheral and vision up close).  He has seen eye MD.  Gradually worsening.  Increased floaters.  Needs earlier f/u appt.  Headaches are better.  Stays active.  No chest pain.  No sob.  No acid reflux.  No abdominal pain.  Bowels moving.  Saw Dr Lucky Cowboy 04/2017.  Planned f/u in one year.  Stable.  Had colonoscopy 11/2016.     Past Medical History:  Diagnosis Date  . Chronic headaches   . Degenerative disc disease, cervical   . Genetic testing 03/20/2017   Multi-Cancer panel (83 genes) @ [facility] - No pathogenic mutations detected  . Hypercholesterolemia   . Hypertension   . Peripheral vascular disease ([place])   . Tobacco abuse    Past Surgical History:  Procedure Laterality Date  . COLONOSCOPY WITH PROPOFOL N/A 11/25/2016   Procedure: COLONOSCOPY WITH PROPOFOL;  Surgeon: Toledo, [name], MD;  Location: [facility];  Service: Endoscopy;  Laterality: N/A;  . ESOPHAGOGASTRODUODENOSCOPY (EGD) WITH PROPOFOL N/A 11/25/2016   Procedure: ESOPHAGOGASTRODUODENOSCOPY (EGD) WITH PROPOFOL;  Surgeon: Toledo, [name], MD;  Location: [facility] ENDOSCOPY;  Service: Endoscopy;  Laterality: N/A;  . HERNIA REPAIR     Inguinal,laparoscopic surgery  . PERIPHERAL VASCULAR CATHETERIZATION Right 05/21/2015   Procedure: Lower Extremity Angiography;  Surgeon: Algernon Huxley, MD;  Location: [facility];  Service: Cardiovascular;  Laterality: Right;  . PERIPHERAL VASCULAR CATHETERIZATION  05/21/2015   Procedure: Lower Extremity Intervention;  Surgeon: Algernon Huxley, MD;  Location: [facility];  Service: Cardiovascular;;  . radio-ablated therapy     completed in the pain clinic   Family  History  Problem Relation Age of Onset  . Hypertension Mother   . Breast cancer Mother 23  . Uterine cancer Mother        unconfirmed; dx 91s; TAH/BSO; currently 90  . Hyperlipidemia Father   . Bladder Cancer Paternal Grandfather 72       smoker; deceased 25  . Diabetes Maternal Grandmother   . Non-Hodgkin's lymphoma Paternal Aunt        deceased 24   Social History   Socioeconomic History  . Marital status: Married    Spouse name: Not on file  . Number of children: Not on file  . Years of education: Not on file  . Highest education level: Not on file  Occupational History  . Not on file  Social Needs  . Financial resource strain: Not on file  . Food insecurity:    Worry: Not on file    Inability: Not on file  . Transportation needs:    Medical: Not on file    Non-medical: Not on file  Tobacco Use  . Smoking status: Former Smoker    Packs/day: 1.00    Years: 37.00    Pack years: 37.00    Types: Cigarettes    Last attempt to quit: 05/25/2015    Years since quitting: 2.4  . Smokeless tobacco: Never Used  Substance and Sexual Activity  . Alcohol use: Yes    Alcohol/week: 0.0 standard drinks    Comment: very  occasional alcohol use  . Drug use: No  . Sexual activity: Not on file  Lifestyle  . Physical activity:    Days per week: Not on file    Minutes per session: Not on file  . Stress: Not on file  Relationships  . Social connections:    Talks on phone: Not on file    Gets together: Not on file    Attends religious service: Not on file    Active member of club or organization: Not on file    Attends meetings of clubs or organizations: Not on file    Relationship status: Not on file  Other Topics Concern  . Not on file  Social History Narrative  . Not on file    Outpatient Encounter Medications as of 10/26/2017  Medication Sig  . amitriptyline (ELAVIL) 25 MG tablet Take 1 tablet (25 mg total) by mouth at bedtime.  Marland Kitchen atorvastatin (LIPITOR) 10 MG tablet TAKE  1 TABLET BY MOUTH ONCE DAILY  . clopidogrel (PLAVIX) 75 MG tablet TAKE 1 TABLET BY MOUTH ONCE DAILY  . losartan-hydrochlorothiazide (HYZAAR) 100-25 MG tablet TAKE 1 TABLET BY MOUTH ONCE DAILY  . omega-3 acid ethyl esters (LOVAZA) 1 g capsule Take 1 capsule (1 g total) by mouth daily.  . pantoprazole (PROTONIX) 40 MG tablet Take 1 tablet (40 mg total) by mouth daily.  . vitamin B-12 (CYANOCOBALAMIN) 1000 MCG tablet Take 1,000 mcg by mouth daily.  . [DISCONTINUED] amitriptyline (ELAVIL) 50 MG tablet TAKE 1 TABLET BY MOUTH AT BEDTIME  . [DISCONTINUED] atorvastatin (LIPITOR) 10 MG tablet Take 1 tablet (10 mg total) by mouth daily.  . [DISCONTINUED] HYDROcodone-acetaminophen (NORCO/VICODIN) 5-325 MG tablet Take by mouth.  . [DISCONTINUED] losartan-hydrochlorothiazide (HYZAAR) 100-25 MG tablet Take 1 tablet by mouth daily.  . [DISCONTINUED] metoprolol succinate (TOPROL-XL) 50 MG 24 hr tablet Take 1 tablet (50 mg total) by mouth daily. Take with or immediately following a meal.  . [DISCONTINUED] metoprolol succinate (TOPROL-XL) 50 MG 24 hr tablet TAKE 1 TABLET BY MOUTH ONCE DAILY   No facility-administered encounter medications on file as of 10/26/2017.     Review of Systems  Constitutional: Negative for appetite change and unexpected weight change.  HENT: Negative for congestion and sinus pressure.   Eyes: Positive for visual disturbance. Negative for pain.  Respiratory: Negative for cough, chest tightness and shortness of breath.   Cardiovascular: Negative for chest pain, palpitations and leg swelling.  Gastrointestinal: Negative for abdominal pain, diarrhea and nausea.  Genitourinary: Negative for difficulty urinating and dysuria.       Previous increased urination.  Better now.    Musculoskeletal: Negative for back pain and joint swelling.  Skin: Negative for color change and rash.  Neurological: Negative for dizziness and headaches.  Hematological: Negative for adenopathy. Does not  bruise/bleed easily.  Psychiatric/Behavioral: Negative for decreased concentration and dysphoric mood.       Objective:     Blood pressure rechecked by me:  120/72  Physical Exam  Constitutional: He is oriented to person, place, and time. He appears well-developed and well-nourished. No distress.  HENT:  Head: Normocephalic and atraumatic.  Nose: Nose normal.  Mouth/Throat: Oropharynx is clear and moist. No oropharyngeal exudate.  Eyes: Conjunctivae are normal. Right eye exhibits no discharge. Left eye exhibits no discharge.  Neck: Neck supple. No thyromegaly present.  Cardiovascular: Normal rate and regular rhythm.  Pulmonary/Chest: Breath sounds normal. No respiratory distress. He has no wheezes.  Abdominal: Soft. Bowel sounds are normal. There  is no tenderness.  Genitourinary:  Genitourinary Comments: Not performed.    Musculoskeletal: He exhibits no edema or tenderness.  Lymphadenopathy:    He has no cervical adenopathy.  Neurological: He is alert and oriented to person, place, and time.  Skin: No rash noted. No erythema.  Psychiatric: He has a normal mood and affect. His behavior is normal.    BP 124/70 (BP Location: Left Arm, Patient Position: Sitting, Cuff Size: Normal)   Pulse 82   Temp 98 F (36.7 C) (Oral)   Resp 18   Ht 5\' 9"  (1.753 m)   Wt 174 lb (78.9 kg)   SpO2 97%   BMI 25.70 kg/m  Wt Readings from Last 3 Encounters:  10/26/17 174 lb (78.9 kg)  06/13/17 175 lb (79.4 kg)  05/02/17 174 lb 12.8 oz (79.3 kg)     Lab Results  Component Value Date   WBC 6.8 08/22/2016   HGB 13.6 08/22/2016   HCT 39.5 08/22/2016   PLT 216.0 08/22/2016   GLUCOSE 84 08/22/2016   CHOL 198 09/15/2015   TRIG (H) 09/15/2015    648.0 Triglyceride is over 400; calculations on Lipids are invalid.   HDL 24.40 (L) 09/15/2015   LDLDIRECT 55.0 09/15/2015   LDLCALC 60 04/24/2013   ALT 14 08/22/2016   AST 20 08/22/2016   NA 140 08/22/2016   K 3.7 08/22/2016   CL 101 08/22/2016    CREATININE 1.01 08/22/2016   BUN 16 08/22/2016   CO2 32 08/22/2016   TSH 2.52 12/11/2014   PSA 2.03 08/22/2016   INR 1.09 05/24/2015   HGBA1C 5.6 06/18/2013    Ct Chest Lcs Nodule F/u W/o Contrast  Result Date: 09/14/2017 CLINICAL DATA:  62 year old male former smoker (quit 3 years ago) with 37 pack year history of smoking. Lung cancer screening examination. EXAM: CT CHEST WITHOUT CONTRAST FOR LUNG CANCER SCREENING NODULE FOLLOW-UP TECHNIQUE: Multidetector CT imaging of the chest was performed following the standard protocol without IV contrast. COMPARISON:  Low-dose lung cancer screening chest CT 06/13/2017. FINDINGS: Cardiovascular: Heart size is normal. There is no significant pericardial fluid, thickening or pericardial calcification. There is aortic atherosclerosis, as well as atherosclerosis of the great vessels of the mediastinum and the coronary arteries, including calcified atherosclerotic plaque in the left circumflex coronary arteries. 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 Mediastinum/Nodes: No pathologically enlarged mediastinal or hilar lymph nodes. Please note that accurate exclusion of hilar adenopathy is limited on noncontrast CT scans. Esophagus is unremarkable in appearance. No axillary lymphadenopathy. Lungs/Pleura: The nodule of concern in the periphery of the right upper lobe on the prior study is stable in size on today's examination (axial image 101 of series 3), with a volume derived mean diameter of 8.7 mm. Notably, this nodule also has a central calcification, suggesting a developing granuloma. Other tiny calcified granulomas are noted. No larger more suspicious appearing pulmonary nodules or masses are identified. No acute consolidative airspace disease. No pleural effusions. Mild diffuse bronchial  wall thickening with mild centrilobular and moderate paraseptal emphysema. Upper Abdomen: Unremarkable. Musculoskeletal: There are no aggressive appearing lytic or blastic lesions noted in the visualized portions of the skeleton. IMPRESSION: 1. Lung-RADS 2S, benign appearance or behavior. Continue annual screening with low-dose chest CT without contrast in 12 months. 2. The "S" modifier above refers to potentially clinically significant non lung  cancer related findings. Specifically, there is aortic atherosclerosis, in addition to left circumflex coronary artery disease. Please note that although the presence of coronary artery calcium documents the presence of coronary artery disease, the severity of this disease and any potential stenosis cannot be assessed on this non-gated CT examination. Assessment for potential risk factor modification, dietary therapy or pharmacologic therapy may be warranted, if clinically indicated. 3. Mild diffuse bronchial wall thickening with mild centrilobular and moderate paraseptal emphysema; imaging findings suggestive of underlying COPD. Aortic Atherosclerosis (ICD10-I70.0) and Emphysema (ICD10-J43.9). Electronically Signed   By: Vinnie Langton [name]   On: 09/14/2017 11:25       Assessment & Plan:   Problem List Items Addressed This Visit    Anemia    Follow cbc.       Aortic atherosclerosis (HCC)    On lipitor.        Essential hypertension, benign    Blood pressure under good control.  Continue same medication regimen.  Follow pressures.  Follow metabolic panel.        Relevant Orders   CBC with Differential/Platelet   TSH   Basic metabolic panel   GERD (gastroesophageal reflux disease)    Controlled on current regimen.        Headache    Doing better.  Follow.        Relevant Medications   amitriptyline (ELAVIL) 25 MG tablet   Health care maintenance    Physical today 10/26/17.  Colonoscopy 11/2016 - moderate diverticulosis, 53mm polyp rectum and  non bleeding internal hemorrhoids.  Check psa with next labs.        Peripheral vascular disease ([place])    Last checked 04/2017. - ABIs wit no arterial insufficiency and patent intervention.  Doing well.  On lipitor and plavix.  Recheck in one year.        Pure hypercholesterolemia    On lipitor.  Low cholesterol diet and exercise.  Follow lipid panel and liver function tests.        Relevant Orders   Hepatic function panel   Lipid panel   Vision changes    Changes in vision as outlined.  Has seen ophthalmology.  Needs earlier appt.  With increased floaters and gradual worsening.        Relevant Orders   Ambulatory referral to Ophthalmology    Other Visit Diagnoses    Routine general medical examination at a health care facility    -  Primary   Need for immunization against influenza       Relevant Orders   Flu Vaccine QUAD 36+ mos IM (Completed)   Prostate cancer screening       Relevant Orders   PSA       [name], MD

## 2017-10-26 NOTE — Assessment & Plan Note (Addendum)
Physical today 10/26/17.  Colonoscopy 11/2016 - moderate diverticulosis, 40mm polyp rectum and non bleeding internal hemorrhoids.  Check psa with next labs.

## 2017-10-27 ENCOUNTER — Other Ambulatory Visit: Payer: Self-pay | Admitting: Internal Medicine

## 2017-10-29 ENCOUNTER — Encounter: Payer: Self-pay | Admitting: Internal Medicine

## 2017-10-29 DIAGNOSIS — I700 Atherosclerosis of aorta: Secondary | ICD-10-CM | POA: Insufficient documentation

## 2017-10-29 DIAGNOSIS — H539 Unspecified visual disturbance: Secondary | ICD-10-CM | POA: Insufficient documentation

## 2017-10-29 NOTE — Assessment & Plan Note (Signed)
On lipitor.  Low cholesterol diet and exercise.  Follow lipid panel and liver function tests.   

## 2017-10-29 NOTE — Assessment & Plan Note (Signed)
Controlled on current regimen.   

## 2017-10-29 NOTE — Assessment & Plan Note (Signed)
Blood pressure under good control.  Continue same medication regimen.  Follow pressures.  Follow metabolic panel.   

## 2017-10-29 NOTE — Assessment & Plan Note (Signed)
Doing better.  Follow.   

## 2017-10-29 NOTE — Assessment & Plan Note (Signed)
Follow cbc.  

## 2017-10-29 NOTE — Assessment & Plan Note (Signed)
Changes in vision as outlined.  Has seen ophthalmology.  Needs earlier appt.  With increased floaters and gradual worsening.

## 2017-10-29 NOTE — Assessment & Plan Note (Signed)
Last checked 04/2017. - ABIs wit no arterial insufficiency and patent intervention.  Doing well.  On lipitor and plavix.  Recheck in one year.

## 2017-10-29 NOTE — Assessment & Plan Note (Signed)
On lipitor

## 2017-11-07 ENCOUNTER — Other Ambulatory Visit (INDEPENDENT_AMBULATORY_CARE_PROVIDER_SITE_OTHER): Payer: 59

## 2017-11-07 DIAGNOSIS — I10 Essential (primary) hypertension: Secondary | ICD-10-CM

## 2017-11-07 DIAGNOSIS — E7800 Pure hypercholesterolemia, unspecified: Secondary | ICD-10-CM | POA: Diagnosis not present

## 2017-11-07 DIAGNOSIS — Z125 Encounter for screening for malignant neoplasm of prostate: Secondary | ICD-10-CM | POA: Diagnosis not present

## 2017-11-07 LAB — BASIC METABOLIC PANEL
BUN: 17 mg/dL (ref 6–23)
CO2: 32 mEq/L (ref 19–32)
Calcium: 9.3 mg/dL (ref 8.4–10.5)
Chloride: 104 mEq/L (ref 96–112)
Creatinine, Ser: 1.07 mg/dL (ref 0.40–1.50)
GFR: 74.8 mL/min (ref >60.00)
Glucose, Bld: 106 mg/dL — ABNORMAL HIGH (ref 70–99)
POTASSIUM: 4.6 meq/L (ref 3.5–5.1)
Sodium: 141 mEq/L (ref 135–145)

## 2017-11-07 LAB — CBC WITH DIFFERENTIAL/PLATELET
BASOS PCT: 0.7 % (ref 0.0–3.0)
Basophils Absolute: 0 10*3/uL (ref 0.0–0.1)
EOS ABS: 0.2 10*3/uL (ref 0.0–0.7)
EOS PCT: 3.3 % (ref 0.0–5.0)
HEMATOCRIT: 41.8 % (ref 39.0–52.0)
HEMOGLOBIN: 14.4 g/dL (ref 13.0–17.0)
LYMPHS PCT: 50.1 % — AB (ref 12.0–46.0)
Lymphs Abs: 3.4 10*3/uL (ref 0.7–4.0)
MCHC: 34.4 g/dL (ref 30.0–36.0)
MCV: 93.2 fl (ref 78.0–100.0)
Monocytes Absolute: 0.5 10*3/uL (ref 0.1–1.0)
Monocytes Relative: 7.6 % (ref 3.0–12.0)
Neutro Abs: 2.6 10*3/uL (ref 1.4–7.7)
Neutrophils Relative %: 38.3 % — ABNORMAL LOW (ref 43.0–77.0)
Platelets: 226 10*3/uL (ref 150.0–400.0)
RBC: 4.49 Mil/uL (ref 4.22–5.81)
RDW: 13.5 % (ref 11.5–15.5)
WBC: 6.9 10*3/uL (ref 4.0–10.5)

## 2017-11-07 LAB — HEPATIC FUNCTION PANEL
ALBUMIN: 4.4 g/dL (ref 3.5–5.2)
ALT: 16 U/L (ref 0–53)
AST: 16 U/L (ref 0–37)
Alkaline Phosphatase: 60 U/L (ref 39–117)
BILIRUBIN DIRECT: 0.1 mg/dL (ref 0.0–0.3)
Total Bilirubin: 0.5 mg/dL (ref 0.2–1.2)
Total Protein: 7 g/dL (ref 6.0–8.3)

## 2017-11-07 LAB — TSH: TSH: 4.19 u[IU]/mL (ref 0.35–4.50)

## 2017-11-07 LAB — PSA: PSA: 1.74 ng/mL (ref 0.10–4.00)

## 2017-11-07 LAB — LIPID PANEL
CHOLESTEROL: 188 mg/dL (ref 0–200)
HDL: 26.2 mg/dL — ABNORMAL LOW (ref >39.00)
Total CHOL/HDL Ratio: 7

## 2017-11-07 LAB — LDL CHOLESTEROL, DIRECT: LDL DIRECT: 66 mg/dL

## 2017-11-08 ENCOUNTER — Other Ambulatory Visit: Payer: Self-pay | Admitting: Internal Medicine

## 2017-11-08 DIAGNOSIS — H43391 Other vitreous opacities, right eye: Secondary | ICD-10-CM | POA: Diagnosis not present

## 2017-11-08 DIAGNOSIS — R739 Hyperglycemia, unspecified: Secondary | ICD-10-CM

## 2017-11-08 NOTE — Progress Notes (Signed)
Order placed for f/u labs.  

## 2017-12-12 DIAGNOSIS — H43391 Other vitreous opacities, right eye: Secondary | ICD-10-CM | POA: Diagnosis not present

## 2017-12-14 ENCOUNTER — Other Ambulatory Visit (INDEPENDENT_AMBULATORY_CARE_PROVIDER_SITE_OTHER): Payer: 59

## 2017-12-14 DIAGNOSIS — R739 Hyperglycemia, unspecified: Secondary | ICD-10-CM | POA: Diagnosis not present

## 2017-12-14 LAB — HEMOGLOBIN A1C: Hgb A1c MFr Bld: 5.5 % (ref 4.6–6.5)

## 2017-12-15 ENCOUNTER — Encounter: Payer: Self-pay | Admitting: Internal Medicine

## 2017-12-15 LAB — GLUCOSE, FASTING: Glucose, Plasma: 101 mg/dL — ABNORMAL HIGH (ref 65–99)

## 2018-01-08 ENCOUNTER — Encounter: Payer: Self-pay | Admitting: Internal Medicine

## 2018-01-08 NOTE — Telephone Encounter (Signed)
Please have someone call him and let them know he needs to be seen.

## 2018-01-09 ENCOUNTER — Ambulatory Visit (INDEPENDENT_AMBULATORY_CARE_PROVIDER_SITE_OTHER): Payer: 59 | Admitting: Family Medicine

## 2018-01-09 VITALS — BP 100/68 | HR 78 | Temp 99.0°F | Ht 69.0 in | Wt 174.2 lb

## 2018-01-09 DIAGNOSIS — R05 Cough: Secondary | ICD-10-CM

## 2018-01-09 DIAGNOSIS — I10 Essential (primary) hypertension: Secondary | ICD-10-CM | POA: Diagnosis not present

## 2018-01-09 DIAGNOSIS — R339 Retention of urine, unspecified: Secondary | ICD-10-CM

## 2018-01-09 DIAGNOSIS — R350 Frequency of micturition: Secondary | ICD-10-CM

## 2018-01-09 DIAGNOSIS — R059 Cough, unspecified: Secondary | ICD-10-CM

## 2018-01-09 DIAGNOSIS — R0982 Postnasal drip: Secondary | ICD-10-CM

## 2018-01-09 DIAGNOSIS — R39198 Other difficulties with micturition: Secondary | ICD-10-CM | POA: Diagnosis not present

## 2018-01-09 LAB — POCT URINALYSIS DIPSTICK
BILIRUBIN UA: NEGATIVE
GLUCOSE UA: NEGATIVE
KETONES UA: NEGATIVE
Leukocytes, UA: NEGATIVE
Nitrite, UA: NEGATIVE
Protein, UA: NEGATIVE
RBC UA: NEGATIVE
Spec Grav, UA: 1.01 (ref 1.010–1.025)
Urobilinogen, UA: 0.2 E.U./dL
pH, UA: 7 (ref 5.0–8.0)

## 2018-01-09 LAB — URINALYSIS, MICROSCOPIC ONLY
RBC / HPF: NONE SEEN (ref 0–?)
WBC, UA: NONE SEEN (ref 0–?)

## 2018-01-09 MED ORDER — HYDROCHLOROTHIAZIDE 25 MG PO TABS: 25.0000 mg | ORAL_TABLET | Freq: Every day | ORAL | 3 refills | Status: DC

## 2018-01-09 MED ORDER — TAMSULOSIN HCL 0.4 MG PO CAPS: 0.4000 mg | ORAL_CAPSULE | Freq: Every day | ORAL | 3 refills | Status: DC

## 2018-01-09 MED ORDER — LOSARTAN POTASSIUM 100 MG PO TABS: 100.0000 mg | ORAL_TABLET | Freq: Every day | ORAL | 3 refills | Status: DC

## 2018-01-09 NOTE — Patient Instructions (Signed)
Take claritin (loratadine) 10 mg once daily, this is a great OTC antihistamine that can help congestion, post nasal drip  You can take mucinex and claritin together

## 2018-01-09 NOTE — Progress Notes (Signed)
Subjective:    Patient ID: Travis Gray, male    DOB: [birth year], 62 y.o.   MRN: [id number]  HPI  Presents to clinic c/o urinary frequency and urgency for over a year.  Patient states he feels like he has to go all the time, and when he does go he often has a hard time starting the stream and then feels like he does not fully empty.  Patient recently had lab work in October November and it showed he is nondiabetic, kidney functions remained stable, PSA is better than it was last year and is in the normal range.  Lab Results  Component Value Date   HGBA1C 5.5 12/14/2017   BMP Latest Ref Rng & Units 11/07/2017 08/22/2016 09/15/2015  Glucose 70 - 99 mg/dL 106(H) 84 106(H)  BUN 6 - 23 mg/dL 17 16 17   Creatinine 0.40 - 1.50 mg/dL 1.07 1.01 1.07  Sodium 135 - 145 mEq/L 141 140 140  Potassium 3.5 - 5.1 mEq/L 4.6 3.7 4.4  Chloride 96 - 112 mEq/L 104 101 103  CO2 19 - 32 mEq/L 32 32 30  Calcium 8.4 - 10.5 mg/dL 9.3 9.4 9.0   CBC Latest Ref Rng & Units 11/07/2017 08/22/2016 09/15/2015  WBC 4.0 - 10.5 K/uL 6.9 6.8 4.5  Hemoglobin 13.0 - 17.0 g/dL 14.4 13.6 13.5  Hematocrit 39.0 - 52.0 % 41.8 39.5 38.5(L)  Platelets 150.0 - 400.0 K/uL 226.0 216.0 198.0   PSA 11/07/17: 1.74 (was 2.03 a year ago)   Patient also complains of some nasal congestion and cough for the past week or so.  Patient has been using Mucinex with some effect in improving cough.  Patient also request refill BP meds  Patient Active Problem List   Diagnosis Date Noted  . Aortic atherosclerosis ([place]) 10/29/2017  . Vision changes 10/29/2017  . Genetic testing 03/20/2017  . Sebaceous adenoma 03/06/2017  . Sleep apnea 02/27/2017  . H/O hernia repair 02/27/2017  . Head injury 02/27/2017  . Cataract 02/27/2017  . 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 Right arm numbness 01/23/2017  . Abdominal pain 08/22/2016  . GERD (gastroesophageal reflux disease) 08/22/2016  . Anemia 01/21/2016  . Peripheral vascular disease ([place])  05/03/2015  . Scalp lesion 08/28/2014  . Chest pain 04/27/2014  . Right hip pain 04/27/2014  . Health care maintenance 04/27/2014  . Sinusitis 03/23/2014  . Numbness and tingling in hands 03/23/2014  . Muscle cramps 03/23/2014  . Back pain 03/23/2014  . Right shoulder pain 06/18/2013  . Right elbow pain 12/02/2012  . History of colonic polyps 03/07/2012  . Tobacco abuse 03/07/2012  . Pure hypercholesterolemia 03/02/2012  . Headache 03/02/2012  . Essential hypertension, benign 03/02/2012   Social History   Tobacco Use  . Smoking status: Former Smoker    Packs/day: 1.00    Years: 37.00    Pack years: 37.00    Types: Cigarettes    Last attempt to quit: 05/25/2015    Years since quitting: 2.6  . Smokeless tobacco: Never Used  Substance Use Topics  . Alcohol use: Yes    Alcohol/week: 0.0 standard drinks    Comment: very occasional alcohol use   Review of Systems  Constitutional: Negative for chills, fatigue and fever.  HENT: +sinus congestion. Negative for ear pain, sinus pain and sore throat.   Eyes: Negative.   Respiratory: +dry cough. Negative for shortness of breath and wheezing.   Cardiovascular: Negative for chest pain, palpitations and leg swelling.  Gastrointestinal: Negative for  abdominal pain, diarrhea, nausea and vomiting.  Genitourinary: +frequency and urgency.  Musculoskeletal: Negative for arthralgias and myalgias.  Skin: Negative for color change, pallor and rash.  Neurological: Negative for syncope, light-headedness and headaches.  Psychiatric/Behavioral: The patient is not nervous/anxious.       Objective:   Physical Exam Vitals signs and nursing note reviewed.  Constitutional:      General: He is not in acute distress.    Appearance: Normal appearance. He is not toxic-appearing.  HENT:     Head: Normocephalic and atraumatic.     Right Ear: Tympanic membrane and ear canal normal.     Left Ear: Tympanic membrane and ear canal normal.     Nose:  Rhinorrhea present.     Comments: Clear nasal drainage, +post nasal drip    Mouth/Throat:     Mouth: Mucous membranes are moist.  Eyes:     General: No scleral icterus.    Extraocular Movements: Extraocular movements intact.     Conjunctiva/sclera: Conjunctivae normal.  Neck:     Musculoskeletal: Normal range of motion. No neck rigidity.  Cardiovascular:     Rate and Rhythm: Normal rate.  Genitourinary:    Comments: Declines GU exam Musculoskeletal:     Right lower leg: No edema.     Left lower leg: No edema.  Lymphadenopathy:     Cervical: No cervical adenopathy.  Skin:    General: Skin is warm and dry.     Coloration: Skin is not jaundiced or pale.  Neurological:     Mental Status: He is alert.  Psychiatric:        Mood and Affect: Mood normal.        Behavior: Behavior normal.       Vitals:   01/09/18 1034  BP: 100/68  Pulse: 78  Temp: 99 F (37.2 C)  SpO2: 95%   Assessment & Plan:   Frequency of urination/incomplete bladder emptying/difficulty in urination- suspect the patient could possibly be having prostate issues.  He is agreeable to begin taking Flomax 0.4 mg once daily and also seeing urology due to length of time he is having these issues.  Urinalysis is unremarkable in clinic, will also send to lab for culture and microscopic exam.  Central hypertension-blood pressure stable on current medication regimen.  Refill given.  Postnasal drip/cough-suspect cough is related to postnasal drip.  Nasal discharge is clear, so I do not suspect any bacterial sinusitis.  Advised to continue using Mucinex as needed for cough and also to start taking a daily antihistamine like a Claritin to help dry up postnasal drip/nasal congestion  Keep regularly scheduled follow-up as planned with PCP.  Patient aware someone will reach out with him in regards to urology referral.

## 2018-01-10 LAB — URINE CULTURE
MICRO NUMBER: 91508428
RESULT: NO GROWTH
SPECIMEN QUALITY:: ADEQUATE

## 2018-01-11 NOTE — Telephone Encounter (Signed)
Patient saw Lauren.

## 2018-01-13 ENCOUNTER — Other Ambulatory Visit: Payer: Self-pay | Admitting: Internal Medicine

## 2018-01-29 ENCOUNTER — Telehealth (INDEPENDENT_AMBULATORY_CARE_PROVIDER_SITE_OTHER): Payer: Self-pay

## 2018-01-29 ENCOUNTER — Ambulatory Visit: Payer: Self-pay

## 2018-01-29 ENCOUNTER — Other Ambulatory Visit: Payer: Self-pay | Admitting: Internal Medicine

## 2018-01-29 NOTE — Telephone Encounter (Signed)
Patient called in with c/o "chest pain." He says "the pain started about 3 days ago in the upper chest, feeling like I had pulled a muscle while working. The pain was in my left arm a little, but now it's in my right forearm and my right leg. I had stents placed in my leg before, so I called the vascular doctor and they moved my appointment up, but told me to see Dr. Nicki Reaper for the pain in my arm and chest. The pain in my arm feels like it did in my leg when I had the stents placed for poor circulation. The pain is not sharp, but a dull, throbbing pain from a 5-8." I asked if it's worse with exertion, he denies, he says "it comes and goes, but more noticeable when I sit down, relax or even lay down in the bed. I'm active working all day, so I don't really notice it."  I asked about other symptoms, he denies. According to protocol, see PCP within 3 days, no availability with PCP, appointment scheduled for tomorrow at 1430 with Dr. Derrel Nip, care advice given, patient verbalized understanding.   Reason for Disposition . [1] Chest pain(s) lasting a few seconds AND [2] persists > 3 days  Answer Assessment - Initial Assessment Questions 1. LOCATION: "Where does it hurt?"       Upper chest 2. RADIATION: "Does the pain go anywhere else?" (e.g., into neck, jaw, arms, back)     Right arm; right leg; left arm at times 3. ONSET: "When did the chest pain begin?" (Minutes, hours or days)      Noticed it 3-4 days ago 4. PATTERN "Does the pain come and go, or has it been constant since it started?"  "Does it get worse with exertion?"      Comes and goes; not worse with exertion; more noticeable in the evening when I sit and relax or lay down 5. DURATION: "How long does it last" (e.g., seconds, minutes, hours)     Varies, may be 10 minutes to 1 hour 6. SEVERITY: "How bad is the pain?"  (e.g., Scale 1-10; mild, moderate, or severe)    - MILD (1-3): doesn't interfere with normal activities     - MODERATE (4-7):  interferes with normal activities or awakens from sleep    - SEVERE (8-10): excruciating pain, unable to do any normal activities       5-8, dull throbbing pain 7. CARDIAC RISK FACTORS: "Do you have any history of heart problems or risk factors for heart disease?" (e.g., prior heart attack, angina; high blood pressure, diabetes, being overweight, high cholesterol, smoking, or strong family history of heart disease)     Yes risk factors-HTN, high cholesterol 8. PULMONARY RISK FACTORS: "Do you have any history of lung disease?"  (e.g., blood clots in lung, asthma, emphysema, birth control pills)     No 9. CAUSE: "What do you think is causing the chest pain?"     I don't know 10. OTHER SYMPTOMS: "Do you have any other symptoms?" (e.g., dizziness, nausea, vomiting, sweating, fever, difficulty breathing, cough)       No 11. PREGNANCY: "Is there any chance you are pregnant?" "When was your last menstrual period?"       N/A  Protocols used: CHEST PAIN-A-AH

## 2018-01-29 NOTE — Telephone Encounter (Signed)
Please advise on below  

## 2018-01-29 NOTE — Telephone Encounter (Signed)
-----   Message from Carlena Bjornstad sent at 01/29/2018  1:27 PM EST ----- Regarding: pains Patient would like to know if he can have his April appointment moved up because he is having some weird leg pains, arm pains and some chest pains. [phone]

## 2018-01-29 NOTE — Telephone Encounter (Signed)
We typically see patients regarding vascular issues, so the pain that is going on in his legs is something that we can definitely see the patient about.  However, his chest and arm pains are concerning because these could be cardiac in nature, which could dangerous, but it is also something we don't treat.  He should call his primary doctor or cardiologist (if he has one) to see if there should be further tests done to look into his chest and arm pain, such as blood work and an Copy. Otherwise, we would be happy to move up his tests to further look into his leg pains.

## 2018-01-29 NOTE — Telephone Encounter (Signed)
Went over information with patient and he states he understands and will reach out to his PCP. He does want his appointment moved up, please contact patient in regards to that. Thank you

## 2018-01-30 ENCOUNTER — Ambulatory Visit: Payer: 59 | Admitting: Internal Medicine

## 2018-01-30 ENCOUNTER — Ambulatory Visit (INDEPENDENT_AMBULATORY_CARE_PROVIDER_SITE_OTHER): Payer: 59

## 2018-01-30 ENCOUNTER — Ambulatory Visit (INDEPENDENT_AMBULATORY_CARE_PROVIDER_SITE_OTHER): Payer: 59 | Admitting: Nurse Practitioner

## 2018-01-30 ENCOUNTER — Telehealth: Payer: Self-pay | Admitting: Radiology

## 2018-01-30 ENCOUNTER — Encounter (INDEPENDENT_AMBULATORY_CARE_PROVIDER_SITE_OTHER): Payer: Self-pay | Admitting: Nurse Practitioner

## 2018-01-30 ENCOUNTER — Encounter: Payer: Self-pay | Admitting: Internal Medicine

## 2018-01-30 VITALS — BP 118/76 | HR 69 | Temp 98.1°F | Resp 14 | Ht 69.0 in | Wt 178.0 lb

## 2018-01-30 VITALS — BP 143/82 | HR 59 | Resp 16 | Ht 69.0 in | Wt 174.2 lb

## 2018-01-30 DIAGNOSIS — M79606 Pain in leg, unspecified: Secondary | ICD-10-CM | POA: Diagnosis not present

## 2018-01-30 DIAGNOSIS — K219 Gastro-esophageal reflux disease without esophagitis: Secondary | ICD-10-CM | POA: Diagnosis not present

## 2018-01-30 DIAGNOSIS — R072 Precordial pain: Secondary | ICD-10-CM | POA: Diagnosis not present

## 2018-01-30 DIAGNOSIS — I739 Peripheral vascular disease, unspecified: Secondary | ICD-10-CM | POA: Diagnosis not present

## 2018-01-30 DIAGNOSIS — M5030 Other cervical disc degeneration, unspecified cervical region: Secondary | ICD-10-CM

## 2018-01-30 DIAGNOSIS — Z87891 Personal history of nicotine dependence: Secondary | ICD-10-CM | POA: Diagnosis not present

## 2018-01-30 DIAGNOSIS — I10 Essential (primary) hypertension: Secondary | ICD-10-CM

## 2018-01-30 DIAGNOSIS — M79603 Pain in arm, unspecified: Secondary | ICD-10-CM | POA: Diagnosis not present

## 2018-01-30 DIAGNOSIS — M47812 Spondylosis without myelopathy or radiculopathy, cervical region: Secondary | ICD-10-CM | POA: Diagnosis not present

## 2018-01-30 DIAGNOSIS — M7918 Myalgia, other site: Secondary | ICD-10-CM | POA: Diagnosis not present

## 2018-01-30 LAB — CK TOTAL AND CKMB (NOT AT ARMC)
CK, MB: 1.7 ng/mL (ref 0–5.0)
Relative Index: 1.5 (ref 0–4.0)
Total CK: 111 U/L (ref 44–196)

## 2018-01-30 NOTE — Telephone Encounter (Signed)
Called [facility] for stat pickup for CK total and CKMB. Spoke with [employer] representative and confirmed for stat pickup. Confirmation number [id number]

## 2018-01-30 NOTE — Progress Notes (Signed)
Subjective:  Patient ID: Travis Gray, male    DOB: 16-May-1955  Age: 63 y.o. MRN: [id number]  CC: The primary encounter diagnosis was Muscle pain, myofascial. Diagnoses of Precordial pain and Upper and lower extremity pain were also pertinent to this visit.  HPI TAOS TAPP presents for urgent evaluation of a 2 week history of intermittent chest pain .  The pain occurs across both sides of chest , and is accompanied by  intermittent throbbing pain rated as   4/10 in right upper and lower arm,  Right thigh and right calf.  It is more noticeable in the evenings after work .  He has known PAD with prior aortoiliac stent. He saw his vascular surgeon this morning  and had ABI's done which did not suggest occlusive disease.   He has DDD documented in the thoracic and lumbar spine.  He has not had cervical spine evaluated    Outpatient Medications Prior to Visit  Medication Sig Dispense Refill  . atorvastatin (LIPITOR) 10 MG tablet TAKE 1 TABLET BY MOUTH ONCE DAILY 30 tablet 2  . clopidogrel (PLAVIX) 75 MG tablet TAKE 1 TABLET BY MOUTH ONCE DAILY 30 tablet 11  . hydrochlorothiazide (HYDRODIURIL) 25 MG tablet Take 1 tablet (25 mg total) by mouth daily. 90 tablet 3  . losartan (COZAAR) 100 MG tablet Take 1 tablet (100 mg total) by mouth daily. 90 tablet 3  . metoprolol succinate (TOPROL-XL) 50 MG 24 hr tablet TAKE 1 TABLET BY MOUTH ONCE DAILY 30 tablet 2  . omega-3 acid ethyl esters (LOVAZA) 1 g capsule Take 1 capsule (1 g total) by mouth daily. 90 capsule 3  . pantoprazole (PROTONIX) 40 MG tablet Take 1 tablet (40 mg total) by mouth daily. 30 tablet 3  . tamsulosin (FLOMAX) 0.4 MG CAPS capsule Take 1 capsule (0.4 mg total) by mouth daily. 30 capsule 3  . vitamin B-12 (CYANOCOBALAMIN) 1000 MCG tablet Take 1,000 mcg by mouth daily.    Marland Kitchen amitriptyline (ELAVIL) 25 MG tablet Take 1 tablet (25 mg total) by mouth at bedtime. 30 tablet 1   No facility-administered medications prior to visit.      Review of Systems;  Patient denies headache, fevers, malaise, unintentional weight loss, skin rash, eye pain, sinus congestion and sinus pain, sore throat, dysphagia,  hemoptysis , cough, dyspnea, wheezing, chest pain, palpitations, orthopnea, edema, abdominal pain, nausea, melena, diarrhea, constipation, flank pain, dysuria, hematuria, urinary  Frequency, nocturia, numbness, tingling, seizures,  Focal weakness, Loss of consciousness,  Tremor, insomnia, depression, anxiety, and suicidal ideation.      Objective:  BP 118/76 (BP Location: Right Arm, Patient Position: Sitting, Cuff Size: Normal)   Pulse 69   Temp 98.1 F (36.7 C) (Oral)   Resp 14   Ht 5\' 9"  (1.753 m)   Wt 178 lb (80.7 kg)   SpO2 96%   BMI 26.29 kg/m   BP Readings from Last 3 Encounters:  01/30/18 118/76  01/30/18 (!) 143/82  01/09/18 100/68    Wt Readings from Last 3 Encounters:  01/30/18 178 lb (80.7 kg)  01/30/18 174 lb 3.2 oz (79 kg)  01/09/18 174 lb 3.2 oz (79 kg)    General appearance: alert, cooperative and appears stated age Ears: normal TM's and external ear canals both ears Throat: lips, mucosa, and tongue normal; teeth and gums normal Neck: no adenopathy, no carotid bruit, supple, symmetrical, trachea midline and thyroid not enlarged, symmetric, no tenderness/mass/nodules Back: symmetric, no curvature. ROM normal. No  CVA tenderness. Lungs: clear to auscultation bilaterally Heart: regular rate and rhythm, S1, S2 normal, no murmur, click, rub or gallop Abdomen: soft, non-tender; bowel sounds normal; no masses,  no organomegaly Pulses: 2+ and symmetric Skin: Skin color, texture, turgor normal. No rashes or lesions Lymph nodes: Cervical, supraclavicular, and axillary nodes normal.  Lab Results  Component Value Date   HGBA1C 5.5 12/14/2017   HGBA1C 5.6 06/18/2013    Lab Results  Component Value Date   CREATININE 1.12 01/30/2018   CREATININE 1.07 11/07/2017   CREATININE 1.01 08/22/2016     Lab Results  Component Value Date   WBC 6.9 11/07/2017   HGB 14.4 11/07/2017   HCT 41.8 11/07/2017   PLT 226.0 11/07/2017   GLUCOSE 88 01/30/2018   CHOL 188 11/07/2017   TRIG (H) 11/07/2017    530.0 Triglyceride is over 400; calculations on Lipids are invalid.   HDL 26.20 (L) 11/07/2017   LDLDIRECT 66.0 11/07/2017   LDLCALC 60 04/24/2013   ALT 16 11/07/2017   AST 16 11/07/2017   NA 139 01/30/2018   K 3.9 01/30/2018   CL 103 01/30/2018   CREATININE 1.12 01/30/2018   BUN 18 01/30/2018   CO2 27 01/30/2018   TSH 3.79 01/30/2018   PSA 1.74 11/07/2017   INR 1.09 05/24/2015   HGBA1C 5.5 12/14/2017    Ct Chest Lcs Nodule F/u W/o Contrast  Result Date: 09/14/2017 CLINICAL DATA:  63 year old male former smoker (quit 3 years ago) with 37 pack year history of smoking. Lung cancer screening examination. EXAM: CT CHEST WITHOUT CONTRAST FOR LUNG CANCER SCREENING NODULE FOLLOW-UP TECHNIQUE: Multidetector CT imaging of the chest was performed following the standard protocol without IV contrast. COMPARISON:  Low-dose lung cancer screening chest CT 06/13/2017. FINDINGS: Cardiovascular: Heart size is normal. There is no significant pericardial fluid, thickening or pericardial calcification. There is aortic atherosclerosis, as well as atherosclerosis of the great vessels of the mediastinum and the coronary arteries, including calcified atherosclerotic plaque in the left circumflex coronary arteries. 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 Mediastinum/Nodes: No pathologically enlarged mediastinal or hilar lymph nodes. Please note that accurate exclusion of hilar adenopathy is limited on noncontrast CT scans. Esophagus is unremarkable in appearance. No axillary lymphadenopathy. Lungs/Pleura: The nodule of concern in the periphery  of the right upper lobe on the prior study is stable in size on today's examination (axial image 101 of series 3), with a volume derived mean diameter of 8.7 mm. Notably, this nodule also has a central calcification, suggesting a developing granuloma. Other tiny calcified granulomas are noted. No larger more suspicious appearing pulmonary nodules or masses are identified. No acute consolidative airspace disease. No pleural effusions. Mild diffuse bronchial wall thickening with mild centrilobular and moderate paraseptal emphysema. Upper Abdomen: Unremarkable. Musculoskeletal: There are no aggressive appearing lytic or blastic lesions noted in the visualized portions of the skeleton. IMPRESSION: 1. Lung-RADS 2S, benign appearance or behavior. Continue annual screening with low-dose chest CT without contrast in 12 months. 2. The "S" modifier above refers to potentially clinically significant non lung cancer related findings. Specifically, there is aortic atherosclerosis, in addition to left circumflex coronary artery disease. Please note that although the presence of coronary artery calcium documents the presence of coronary artery disease, the severity of this disease and any potential stenosis cannot be assessed on this non-gated CT  examination. Assessment for potential risk factor modification, dietary therapy or pharmacologic therapy may be warranted, if clinically indicated. 3. Mild diffuse bronchial wall thickening with mild centrilobular and moderate paraseptal emphysema; imaging findings suggestive of underlying COPD. Aortic Atherosclerosis (ICD10-I70.0) and Emphysema (ICD10-J43.9). Electronically Signed   By: Vinnie Langton [name]   On: 09/14/2017 11:25    Assessment & Plan:   Problem List Items Addressed This Visit    Chest pain    Musculoskeletal origin suspected.  There is no evidence of cardiac or skeletal muscle damage.  Cervical spine films ordered to evaluate for possibility of cervical disk  disease       Relevant Orders   CK total and CKMB (cardiac)not at [facility] (Completed)    Other Visit Diagnoses    Muscle pain, myofascial    -  Primary   Relevant Orders   Basic metabolic panel (Completed)   TSH (Completed)   Upper and lower extremity pain       Relevant Orders   DG Cervical Spine Complete (Completed)      I am having Aarya L. Hinchliffe maintain his omega-3 acid ethyl esters, vitamin B-12, pantoprazole, clopidogrel, metoprolol succinate, losartan, hydrochlorothiazide, tamsulosin, and atorvastatin.  No orders of the defined types were placed in this encounter.   There are no discontinued medications.  Follow-up: Return in about 4 weeks (around 02/27/2018) for me or Dr Nicki Reaper .   Crecencio Mc, MD

## 2018-01-30 NOTE — Progress Notes (Signed)
Subjective:    Patient ID: Travis Gray, male    DOB: Jun 10, 1955, 63 y.o.   MRN: [id number] Chief Complaint  Patient presents with  . Follow-up    HPI  Travis Gray is a 63 y.o. male that presents today earlier than his annual follow-up with concerns for lower extremity leg pain and soreness.  He describes the pain as aching and throbbing in his right thigh that happens in the evening after working all day.  He works on [employer] a lot.  Complaining this leg pain is pain in his right arm as well as chest area.  He does not describe it as a sharp sudden, squeezing, pressure-like pain but rather an aching soreness.  His chest pain is reproducible on palpation.  He denies any pain with ambulation, he denies symptoms.  He has not tried any over-the-counter medication to remedy this.  He denies any weakness of the right lower extremities or left lower extremities.  He denies any fever, chills, nausea, vomiting or diarrhea.  He underwent bilateral ABIs today and he has an ABI of 1.23 on his right lower extremity and an ABI 1.04 on his left.  He has triphasic waveforms on his bilateral posterior tibial arteries.  He has audible triphasic waveforms on his bilateral anterior tibial waveforms.  His previous ABIs were 1.21 bilaterally.  Past Medical History:  Diagnosis Date  . Chronic headaches   . Degenerative disc disease, cervical   . Genetic testing 03/20/2017   Multi-Cancer panel (83 genes) @ [facility] - No pathogenic mutations detected  . Hypercholesterolemia   . Hypertension   . Peripheral vascular disease ([place])   . Tobacco abuse     Past Surgical History:  Procedure Laterality Date  . COLONOSCOPY WITH PROPOFOL N/A 11/25/2016   Procedure: COLONOSCOPY WITH PROPOFOL;  Surgeon: Toledo, [name], MD;  Location: [facility] ENDOSCOPY;  Service: Endoscopy;  Laterality: N/A;  . ESOPHAGOGASTRODUODENOSCOPY (EGD) WITH PROPOFOL N/A 11/25/2016   Procedure: ESOPHAGOGASTRODUODENOSCOPY (EGD) WITH  PROPOFOL;  Surgeon: Toledo, [name], MD;  Location: [facility] ENDOSCOPY;  Service: Endoscopy;  Laterality: N/A;  . HERNIA REPAIR     Inguinal,laparoscopic surgery  . PERIPHERAL VASCULAR CATHETERIZATION Right 05/21/2015   Procedure: Lower Extremity Angiography;  Surgeon: Algernon Huxley, MD;  Location: [place] [facility];  Service: Cardiovascular;  Laterality: Right;  . PERIPHERAL VASCULAR CATHETERIZATION  05/21/2015   Procedure: Lower Extremity Intervention;  Surgeon: Algernon Huxley, MD;  Location: [place] [facility];  Service: Cardiovascular;;  . radio-ablated therapy     completed in the pain clinic    Social History   Socioeconomic History  . Marital status: Married    Spouse name: Not on file  . Number of children: Not on file  . Years of education: Not on file  . Highest education level: Not on file  Occupational History  . Not on file  Social Needs  . Financial resource strain: Not on file  . Food insecurity:    Worry: Not on file    Inability: Not on file  . Transportation needs:    Medical: Not on file    Non-medical: Not on file  Tobacco Use  . Smoking status: Former Smoker    Packs/day: 1.00    Years: 37.00    Pack years: 37.00    Types: Cigarettes    Last attempt to quit: 05/25/2015    Years since quitting: 2.6  . Smokeless tobacco: Never Used  Substance and Sexual  Activity  . Alcohol use: Yes    Alcohol/week: 0.0 standard drinks    Comment: very occasional alcohol use  . Drug use: No  . Sexual activity: Not on file  Lifestyle  . Physical activity:    Days per week: Not on file    Minutes per session: Not on file  . Stress: Not on file  Relationships  . Social connections:    Talks on phone: Not on file    Gets together: Not on file    Attends religious service: Not on file    Active member of club or organization: Not on file    Attends meetings of clubs or organizations: Not on file    Relationship status: Not on file  . Intimate partner violence:    Fear  of current or ex partner: Not on file    Emotionally abused: Not on file    Physically abused: Not on file    Forced sexual activity: Not on file  Other Topics Concern  . Not on file  Social History Narrative  . Not on file    Family History  Problem Relation Age of Onset  . Hypertension Mother   . Breast cancer Mother 13  . Uterine cancer Mother        unconfirmed; dx 19s; TAH/BSO; currently 50  . Hyperlipidemia Father   . Bladder Cancer Paternal Grandfather 40       smoker; deceased 53  . Diabetes Maternal Grandmother   . Non-Hodgkin's lymphoma Paternal Aunt        deceased 58    No Known Allergies   Review of Systems   Review of Systems: Negative Unless Checked Constitutional: [] Weight loss  [] Fever  [] Chills Cardiac: [x] Chest pain   []  Atrial Fibrillation  [] Palpitations   [] Shortness of breath when laying flat   [] Shortness of breath with exertion. [] Shortness of breath at rest Vascular:  [] Pain in legs with walking   [] Pain in legs with standing [x] Pain in legs when sitting   [] Claudication    [] Pain in feet when laying flat    [] History of DVT   [] Phlebitis   [] Swelling in legs   [] Varicose veins   [] Non-healing ulcers Pulmonary:   [] Uses home oxygen   [] Productive cough   [] Hemoptysis   [] Wheeze  [] COPD   [] Asthma Neurologic:  [] Dizziness   [] Seizures  [] Blackouts [] History of stroke   [] History of TIA  [] Aphasia   [] Temporary Blindness   [] Weakness or numbness in arm   [] Weakness or numbness in leg Musculoskeletal:   [] Joint swelling   [] Joint pain   [x] Low back pain  []  History of Knee Replacement [] Arthritis [] back Surgeries  []  Spinal Stenosis    Hematologic:  [] Easy bruising  [] Easy bleeding   [] Hypercoagulable state   [x] Anemic Gastrointestinal:  [] Diarrhea   [] Vomiting  [x] Gastroesophageal reflux/heartburn   [] Difficulty swallowing. [] Abdominal pain Genitourinary:  [] Chronic kidney disease   [] Difficult urination  [] Anuric   [] Blood in urine [] Frequent urination   [] Burning with urination   [] Hematuria Skin:  [] Rashes   [] Ulcers [] Wounds Psychological:  [] History of anxiety   []  History of major depression  []  Memory Difficulties     Objective:   Physical Exam  BP (!) 143/82 (BP Location: Right Arm, Patient Position: Sitting)   Pulse (!) 59   Resp 16   Ht 5\' 9"  (1.753 m)   Wt 174 lb 3.2 oz (79 kg)   BMI 25.72 kg/m   Gen: WD/WN, NAD Head:  [place]/AT, No temporalis wasting.  Ear/Nose/Throat: Hearing grossly intact, nares w/o erythema or drainage Eyes: PER, EOMI, sclera nonicteric.  Neck: Supple, no masses.  No JVD.  Pulmonary:  Good air movement, no use of accessory muscles.  Cardiac: RRR Vascular:  Vessel Right Left  Radial Palpable Palpable  Dorsalis Pedis Palpable Palpable  Posterior Tibial Palpable Palpable   Gastrointestinal: soft, non-distended. No guarding/no peritoneal signs.  Musculoskeletal: M/S 5/5 throughout.  No deformity or atrophy.  Neurologic: Pain and light touch intact in extremities.  Symmetrical.  Speech is fluent. Motor exam as listed above. Psychiatric: Judgment intact, Mood & affect appropriate for pt's clinical situation. Dermatologic: No Venous rashes. No Ulcers Noted.  No changes consistent with cellulitis. Lymph : No Cervical lymphadenopathy, no lichenification or skin changes of chronic lymphedema.      Assessment & Plan:   1. Peripheral vascular disease ([place]) He underwent bilateral ABIs today and he has an ABI of 1.23 on his right lower extremity and an ABI 1.04 on his left.  He has triphasic waveforms on his bilateral posterior tibial arteries.  He has audible triphasic waveforms on his bilateral anterior tibial waveforms.  His previous ABIs were 1.21 bilaterally.  Based upon the patient's description of pain and discomfort it is unlikely it is due to his peripheral artery disease.  The patient has an upcoming appointment with his primary care physician for further assessment of his chest and arm pain.  I  believe it is likely due to musculoskeletal strain, due to the widespread distribution of it.  I advised him to try to utilize ibuprofen during the evenings when the pain is more noticeable to see if this gives relief.  I also advised him signs and symptoms of worsening peripheral artery disease that he should watch for such as claudication with ambulation, being able to walk longer distance without having to stop and take rest breaks.  Or, and more emergent issue such as sudden severe pain or wounds of his lower extremities or cold, black feet and toes.  We will have patient follow-up with noninvasive studies in 1 year or sooner if needed. - VAS [place] ABI WITH/WO TBI; Future  2. Essential hypertension, benign Continue antihypertensive medications as already ordered, these medications have been reviewed and there are no changes at this time.   3. Gastroesophageal reflux disease, esophagitis presence not specified Continue PPI as already ordered, this medication has been reviewed and there are no changes at this time.  Avoidence of caffeine and alcohol  Moderate elevation of the head of the bed    Current Outpatient Medications on File Prior to Visit  Medication Sig Dispense Refill  . amitriptyline (ELAVIL) 25 MG tablet Take 1 tablet (25 mg total) by mouth at bedtime. 30 tablet 1  . atorvastatin (LIPITOR) 10 MG tablet TAKE 1 TABLET BY MOUTH ONCE DAILY 30 tablet 2  . clopidogrel (PLAVIX) 75 MG tablet TAKE 1 TABLET BY MOUTH ONCE DAILY 30 tablet 11  . hydrochlorothiazide (HYDRODIURIL) 25 MG tablet Take 1 tablet (25 mg total) by mouth daily. 90 tablet 3  . losartan (COZAAR) 100 MG tablet Take 1 tablet (100 mg total) by mouth daily. 90 tablet 3  . metoprolol succinate (TOPROL-XL) 50 MG 24 hr tablet TAKE 1 TABLET BY MOUTH ONCE DAILY 30 tablet 2  . omega-3 acid ethyl esters (LOVAZA) 1 g capsule Take 1 capsule (1 g total) by mouth daily. 90 capsule 3  . pantoprazole (PROTONIX) 40 MG tablet Take 1  tablet (  40 mg total) by mouth daily. 30 tablet 3  . tamsulosin (FLOMAX) 0.4 MG CAPS capsule Take 1 capsule (0.4 mg total) by mouth daily. 30 capsule 3  . vitamin B-12 (CYANOCOBALAMIN) 1000 MCG tablet Take 1,000 mcg by mouth daily.     No current facility-administered medications on file prior to visit.     There are no Patient Instructions on file for this visit. No follow-ups on file.   Kris Hartmann, NP  This note was completed with Sales executive.  Any errors are purely unintentional.

## 2018-01-30 NOTE — Patient Instructions (Signed)
I agree that the source of your pain is likely from a disk issue in your neck ,  Or due to muscle strain  You can stop the Flomax for a few days to see if the pain is a medication side effect  The labs will rule out muscle damage  And the x rays are to look for arthritic changes that might be affecting the spinal cord

## 2018-01-31 ENCOUNTER — Encounter: Payer: Self-pay | Admitting: Internal Medicine

## 2018-01-31 DIAGNOSIS — M5030 Other cervical disc degeneration, unspecified cervical region: Secondary | ICD-10-CM | POA: Insufficient documentation

## 2018-01-31 LAB — BASIC METABOLIC PANEL
BUN: 18 mg/dL (ref 6–23)
CHLORIDE: 103 meq/L (ref 96–112)
CO2: 27 mEq/L (ref 19–32)
Calcium: 9.6 mg/dL (ref 8.4–10.5)
Creatinine, Ser: 1.12 mg/dL (ref 0.40–1.50)
GFR: 70.91 mL/min (ref >60.00)
Glucose, Bld: 88 mg/dL (ref 70–99)
Potassium: 3.9 mEq/L (ref 3.5–5.1)
Sodium: 139 mEq/L (ref 135–145)

## 2018-01-31 LAB — TSH: TSH: 3.79 u[IU]/mL (ref 0.35–4.50)

## 2018-01-31 MED ORDER — PREDNISONE 10 MG PO TABS: ORAL_TABLET | ORAL | 0 refills | Status: DC

## 2018-01-31 NOTE — Assessment & Plan Note (Signed)
Noted on plain cervical spine films.  Trial of prednisone taper

## 2018-01-31 NOTE — Assessment & Plan Note (Signed)
, °

## 2018-01-31 NOTE — Assessment & Plan Note (Addendum)
Noted on plain cervical spine films done to investigate intermittent right arm and right leg pain .  Trial of prednisone taper

## 2018-01-31 NOTE — Assessment & Plan Note (Signed)
Musculoskeletal origin suspected.  There is no evidence of cardiac or skeletal muscle damage.  Cervical spine films ordered to evaluate for possibility of cervical disk disease

## 2018-02-01 NOTE — Telephone Encounter (Signed)
Patient was evaluated by Dr. Derrel Nip on 01/30/18 for these symptoms. Do you want him to come to our office for a visit with you or are you okay with doing referral to cardiology?

## 2018-02-01 NOTE — Telephone Encounter (Signed)
If concern about not feeling right and chest pain, needs to be evaluated as we discussed.

## 2018-02-01 NOTE — Telephone Encounter (Signed)
Per conversation patient will go to ED if he has anymore pain in his chest, but if negative for any  more pain will see PCP on Monday 02/05/18.

## 2018-02-02 NOTE — Telephone Encounter (Signed)
Patient had to reschedule for 4/71/58, due to conflicting appointment , denies anymore chest pain but will go to ED if it returns.

## 2018-02-02 NOTE — Telephone Encounter (Signed)
Can see if he can come in 02/06/18 at 10:00.  That spot is being held, but I had sent Denisa a note to work in pt at another time.  See me if questions.

## 2018-02-02 NOTE — Telephone Encounter (Signed)
Patient cancelled follow up with Dr. Nicki Reaper due to a conflict with another physician.  Patient states he feels better and denied any symptoms. Advised patient he should still follow up with PCP and patient agreed. PCP has no availability within this month, please reach out to patient regarding another work in for another day.

## 2018-02-05 ENCOUNTER — Encounter: Payer: Self-pay | Admitting: Urology

## 2018-02-05 ENCOUNTER — Ambulatory Visit: Payer: 59 | Admitting: Urology

## 2018-02-05 ENCOUNTER — Ambulatory Visit: Payer: 59 | Admitting: Internal Medicine

## 2018-02-05 VITALS — BP 118/80 | HR 74 | Ht 69.0 in | Wt 175.0 lb

## 2018-02-05 DIAGNOSIS — N411 Chronic prostatitis: Secondary | ICD-10-CM

## 2018-02-05 DIAGNOSIS — R350 Frequency of micturition: Secondary | ICD-10-CM

## 2018-02-05 LAB — URINALYSIS, COMPLETE
Bilirubin, UA: NEGATIVE
Glucose, UA: NEGATIVE
Ketones, UA: NEGATIVE
Leukocytes, UA: NEGATIVE
Nitrite, UA: NEGATIVE
Protein, UA: NEGATIVE
Specific Gravity, UA: 1.01 (ref 1.005–1.030)
Urobilinogen, Ur: 0.2 mg/dL (ref 0.2–1.0)
pH, UA: 6.5 (ref 5.0–7.5)

## 2018-02-05 LAB — BLADDER SCAN AMB NON-IMAGING

## 2018-02-05 MED ORDER — MELOXICAM 7.5 MG PO TABS: 15.0000 mg | ORAL_TABLET | Freq: Every day | ORAL | 1 refills | Status: DC

## 2018-02-05 MED ORDER — CIPROFLOXACIN HCL 500 MG PO TABS: 500.0000 mg | ORAL_TABLET | Freq: Two times a day (BID) | ORAL | 0 refills | Status: DC

## 2018-02-05 NOTE — Progress Notes (Signed)
02/05/2018 11:20 AM   Travis Gray 17-Oct-1955 [id number]  Referring provider: [name], MD [address] [place], [place] [address]  Chief Complaint  Patient presents with  . New Patient (Initial Visit)    urinary frequency    HPI: I was consulted to assess the patient's discomfort with urination and frequency.  He actually has pain in the penis when he voids but otherwise not.  He is voiding every 30 to 60 minutes and has difficulty holding it for 2 hours.  Since starting CPAP he rarely gets up once a night.  He has no pain with ejaculation  His flow was poor.  Sometimes he hesitates sometimes he stops and starts.  Sometimes he strains.  He does feel empty but 10 minutes later can double void a varying amount  Neurologic issues.  No previous surgery or infection history.  Modifying factors: There are no other modifying factors  Associated signs and symptoms: There are no other associated signs and symptoms Aggravating and relieving factors: There are no other aggravating or relieving factors Severity: Moderate Duration: Persistent   PMH: Past Medical History:  Diagnosis Date  . Chronic headaches   . Degenerative disc disease, cervical   . Genetic testing 03/20/2017   Multi-Cancer panel (83 genes) @ [facility] - No pathogenic mutations detected  . Hypercholesterolemia   . Hypertension   . Peripheral vascular disease ([place])   . Tobacco abuse     Surgical History: Past Surgical History:  Procedure Laterality Date  . COLONOSCOPY WITH PROPOFOL N/A 11/25/2016   Procedure: COLONOSCOPY WITH PROPOFOL;  Surgeon: Toledo, [name], MD;  Location: [facility];  Service: Endoscopy;  Laterality: N/A;  . ESOPHAGOGASTRODUODENOSCOPY (EGD) WITH PROPOFOL N/A 11/25/2016   Procedure: ESOPHAGOGASTRODUODENOSCOPY (EGD) WITH PROPOFOL;  Surgeon: Toledo, [name], MD;  Location: [facility] END[facility]COPY;  Service: Endoscopy;  Laterality: N/A;  . HERNIA REPAIR      Inguinal,laparoscopic surgery  . PERIPHERAL VASCULAR CATHETERIZATION Right 05/21/2015   Procedure: Lower Extremity Angiography;  Surgeon: Algernon Huxley, MD;  Location: [place] [facility];  Service: Cardiovascular;  Laterality: Right;  . PERIPHERAL VASCULAR CATHETERIZATION  05/21/2015   Procedure: Lower Extremity Intervention;  Surgeon: Algernon Huxley, MD;  Location: [place] [facility];  Service: Cardiovascular;;  . radio-ablated therapy     completed in the pain clinic    Home Medications:  Allergies as of 02/05/2018   No Known Allergies     Medication List       Accurate as of February 05, 2018 11:20 AM. Always use your most recent med list.        amitriptyline 25 MG tablet Commonly known as:  ELAVIL TAKE 1 TABLET BY MOUTH AT BEDTIME   atorvastatin 10 MG tablet Commonly known as:  LIPITOR TAKE 1 TABLET BY MOUTH ONCE DAILY   clopidogrel 75 MG tablet Commonly known as:  PLAVIX TAKE 1 TABLET BY MOUTH ONCE DAILY   hydrochlorothiazide 25 MG tablet Commonly known as:  HYDRODIURIL Take 1 tablet (25 mg total) by mouth daily.   losartan 100 MG tablet Commonly known as:  COZAAR Take 1 tablet (100 mg total) by mouth daily.   metoprolol succinate 50 MG 24 hr tablet Commonly known as:  TOPROL-XL TAKE 1 TABLET BY MOUTH ONCE DAILY   omega-3 acid ethyl esters 1 g capsule Commonly known as:  LOVAZA Take 1 capsule (1 g total) by mouth daily.   pantoprazole 40 MG tablet Commonly known as:  PROTONIX Take  1 tablet (40 mg total) by mouth daily.   predniSONE 10 MG tablet Commonly known as:  DELTASONE 6 tablets on Day 1 , then reduce by 1 tablet daily until gone   tamsulosin 0.4 MG Caps capsule Commonly known as:  FLOMAX Take 1 capsule (0.4 mg total) by mouth daily.   vitamin B-12 1000 MCG tablet Commonly known as:  CYANOCOBALAMIN Take 1,000 mcg by mouth daily.       Allergies: No Known Allergies  Family History: Family History  Problem Relation Age of Onset  .  Hypertension Mother   . Breast cancer Mother 80  . Uterine cancer Mother        unconfirmed; dx 57s; TAH/BSO; currently 40  . Hyperlipidemia Father   . Bladder Cancer Paternal Grandfather 44       smoker; deceased 47  . Diabetes Maternal Grandmother   . Non-Hodgkin's lymphoma Paternal Aunt        deceased 47    Social History:  reports that he quit smoking about 2 years ago. His smoking use included cigarettes. He has a 37.00 pack-year smoking history. He has never used smokeless tobacco. He reports current alcohol use. He reports that he does not use drugs.  ROS:                                        Physical Exam: BP 118/80 (BP Location: Left Arm, Patient Position: Sitting)   Pulse 74   Ht 5\' 9"  (1.753 m)   Wt 79.4 kg   BMI 25.84 kg/m   Constitutional:  Alert and oriented, No acute distress. HEENT: [place] AT, moist mucus membranes.  Trachea midline, no masses. Cardiovascular: No clubbing, cyanosis, or edema. Respiratory: Normal respiratory effort, no increased work of breathing. GI: Abdomen is soft, nontender, nondistended, no abdominal masses GU:  40 to 50 g benign prostate with normal male genitalia Skin: No rashes, bruises or suspicious lesions. Lymph: No cervical or inguinal adenopathy. Neurologic: Grossly intact, no focal deficits, moving all 4 extremities. Psychiatric: Normal mood and affect.  Laboratory Data: Lab Results  Component Value Date   WBC 6.9 11/07/2017   HGB 14.4 11/07/2017   HCT 41.8 11/07/2017   MCV 93.2 11/07/2017   PLT 226.0 11/07/2017    Lab Results  Component Value Date   CREATININE 1.12 01/30/2018    Lab Results  Component Value Date   PSA 1.74 11/07/2017   PSA 2.03 08/22/2016   PSA 1.08 12/11/2014    No results found for: TESTOSTERONE  Lab Results  Component Value Date   HGBA1C 5.5 12/14/2017    Urinalysis    Component Value Date/Time   COLORURINE YELLOW 08/22/2016 1600   APPEARANCEUR CLEAR 08/22/2016  1600   APPEARANCEUR Clear 05/21/2012 1456   LABSPEC 1.015 08/22/2016 1600   LABSPEC 1.025 05/21/2012 1456   PHURINE 6.0 08/22/2016 1600   GLUCOSEU NEGATIVE 08/22/2016 1600   HGBUR NEGATIVE 08/22/2016 1600   BILIRUBINUR neg 01/09/2018 1049   BILIRUBINUR Negative 05/21/2012 1456   KETONESUR NEGATIVE 08/22/2016 1600   PROTEINUR Negative 01/09/2018 1049   PROTEINUR Negative 05/21/2012 1456   UROBILINOGEN 0.2 01/09/2018 1049   UROBILINOGEN 0.2 08/22/2016 1600   NITRITE neg 01/09/2018 1049   NITRITE NEGATIVE 08/22/2016 1600   LEUKOCYTESUR Negative 01/09/2018 1049   LEUKOCYTESUR Negative 05/21/2012 1456    Pertinent Imaging:   Assessment & Plan: Patient has flow symptoms  and pain with urination.  He has increased frequency.  His residual today was 262 mL.  Urine sent for culture.  I will first treat him for prostatitis.  Pathophysiology and treatment discussed.  I called in Mobic and ciprofloxacin.  It turns out he started Flomax 3 weeks ago and his flow is a bit better and he says he has less pain and I will keep him on this.  Ciprofloxacin 500 mg and Mobic 15 mg sent.  He may need cystoscopy in the future and/or urodynamics  Assessment 6 or 7 weeks with a residual  1. Urinary frequency  - Urinalysis, Complete   No follow-ups on file.  Reece Packer, MD  [place] [address] [place], [place] [address] [phone]

## 2018-02-06 NOTE — Telephone Encounter (Signed)
If he is being treated for  Prostatitis,  He should delay the prednisone until the infection is over  .  The prednisone was prescribed for his neck pain .  My nurse will call Travis Gray in the am with same message.

## 2018-02-07 NOTE — Telephone Encounter (Signed)
See phone note

## 2018-02-07 NOTE — Telephone Encounter (Signed)
Spoke with pt and informed him that Dr. Derrel Nip wants him a hold off on the Prednisone until the Prostatitis is gone. Pt gave a verbal understanding.

## 2018-02-08 LAB — CULTURE, URINE COMPREHENSIVE

## 2018-02-12 ENCOUNTER — Ambulatory Visit: Payer: 59 | Admitting: Internal Medicine

## 2018-02-12 ENCOUNTER — Encounter: Payer: Self-pay | Admitting: Internal Medicine

## 2018-02-12 DIAGNOSIS — E7800 Pure hypercholesterolemia, unspecified: Secondary | ICD-10-CM

## 2018-02-12 DIAGNOSIS — M5030 Other cervical disc degeneration, unspecified cervical region: Secondary | ICD-10-CM | POA: Diagnosis not present

## 2018-02-12 DIAGNOSIS — I10 Essential (primary) hypertension: Secondary | ICD-10-CM

## 2018-02-12 DIAGNOSIS — K219 Gastro-esophageal reflux disease without esophagitis: Secondary | ICD-10-CM | POA: Diagnosis not present

## 2018-02-12 DIAGNOSIS — I739 Peripheral vascular disease, unspecified: Secondary | ICD-10-CM

## 2018-02-12 DIAGNOSIS — M79604 Pain in right leg: Secondary | ICD-10-CM

## 2018-02-12 NOTE — Progress Notes (Signed)
Patient ID: Travis Gray, male   DOB: [birth year], 63 y.o.   MRN: [id number]   Subjective:    Patient ID: Travis Gray, male    DOB: 1955-07-01, 63 y.o.   MRN: [id number]  HPI  Patient here as a work in to follow up on recent visit with Dr Derrel Nip.  Was having chest pain.  C-spine xray with multilevel degenerative change.  Noticed right arm, chest and right leg pain.  Better now.  Still with some pain in right leg and hips.  No chest pain with increased activity or exertion.  No sob.  No acid reflux.  No abdominal pain.  Some constipation.  Takes a stool softener.  Helps.  Was also having some urinary frequency and discomfort.  Saw urology.  On flomax.  Taking mobic and cipro.  Helping.  Has f/u planned with urology.     Past Medical History:  Diagnosis Date  . Chronic headaches   . Degenerative disc disease, cervical   . Genetic testing 03/20/2017   Multi-Cancer panel (83 genes) @ [facility] - No pathogenic mutations detected  . Hypercholesterolemia   . Hypertension   . Peripheral vascular disease ([place])   . Tobacco abuse    Past Surgical History:  Procedure Laterality Date  . COLONOSCOPY WITH PROPOFOL N/A 11/25/2016   Procedure: COLONOSCOPY WITH PROPOFOL;  Surgeon: Toledo, [name], MD;  Location: [facility];  Service: Endoscopy;  Laterality: N/A;  . ESOPHAGOGASTRODUODENOSCOPY (EGD) WITH PROPOFOL N/A 11/25/2016   Procedure: ESOPHAGOGASTRODUODENOSCOPY (EGD) WITH PROPOFOL;  Surgeon: Toledo, [name], MD;  Location: [facility];  Service: Endoscopy;  Laterality: N/A;  . HERNIA REPAIR     Inguinal,laparoscopic surgery  . PERIPHERAL VASCULAR CATHETERIZATION Right 05/21/2015   Procedure: Lower Extremity Angiography;  Surgeon: Algernon Huxley, MD;  Location: [place] [facility];  Service: Cardiovascular;  Laterality: Right;  . PERIPHERAL VASCULAR CATHETERIZATION  05/21/2015   Procedure: Lower Extremity Intervention;  Surgeon: Algernon Huxley, MD;  Location: [place] [facility];  Service:  Cardiovascular;;  . radio-ablated therapy     completed in the pain clinic   Family History  Problem Relation Age of Onset  . Hypertension Mother   . Breast cancer Mother 63  . Uterine cancer Mother        unconfirmed; dx 90s; TAH/BSO; currently 61  . Hyperlipidemia Father   . Bladder Cancer Paternal Grandfather 8       smoker; deceased 65  . Diabetes Maternal Grandmother   . Non-Hodgkin's lymphoma Paternal Aunt        deceased 70   Social History   Socioeconomic History  . Marital status: Married    Spouse name: Not on file  . Number of children: Not on file  . Years of education: Not on file  . Highest education level: Not on file  Occupational History  . Not on file  Social Needs  . Financial resource strain: Not on file  . Food insecurity:    Worry: Not on file    Inability: Not on file  . Transportation needs:    Medical: Not on file    Non-medical: Not on file  Tobacco Use  . Smoking status: Former Smoker    Packs/day: 1.00    Years: 37.00    Pack years: 37.00    Types: Cigarettes    Last attempt to quit: 05/25/2015    Years since quitting: 2.7  . Smokeless tobacco: Never Used  Substance and Sexual Activity  .  Alcohol use: Yes    Alcohol/week: 0.0 standard drinks    Comment: very occasional alcohol use  . Drug use: No  . Sexual activity: Not on file  Lifestyle  . Physical activity:    Days per week: Not on file    Minutes per session: Not on file  . Stress: Not on file  Relationships  . Social connections:    Talks on phone: Not on file    Gets together: Not on file    Attends religious service: Not on file    Active member of club or organization: Not on file    Attends meetings of clubs or organizations: Not on file    Relationship status: Not on file  Other Topics Concern  . Not on file  Social History Narrative  . Not on file    Outpatient Encounter Medications as of 02/12/2018  Medication Sig  . amitriptyline (ELAVIL) 25 MG tablet TAKE 1  TABLET BY MOUTH AT BEDTIME  . atorvastatin (LIPITOR) 10 MG tablet TAKE 1 TABLET BY MOUTH ONCE DAILY  . ciprofloxacin (CIPRO) 500 MG tablet Take 1 tablet (500 mg total) by mouth every 12 (twelve) hours.  . clopidogrel (PLAVIX) 75 MG tablet TAKE 1 TABLET BY MOUTH ONCE DAILY  . hydrochlorothiazide (HYDRODIURIL) 25 MG tablet Take 1 tablet (25 mg total) by mouth daily.  Marland Kitchen losartan (COZAAR) 100 MG tablet Take 1 tablet (100 mg total) by mouth daily.  . meloxicam (MOBIC) 7.5 MG tablet Take 2 tablets (15 mg total) by mouth daily.  . metoprolol succinate (TOPROL-XL) 50 MG 24 hr tablet TAKE 1 TABLET BY MOUTH ONCE DAILY  . omega-3 acid ethyl esters (LOVAZA) 1 g capsule Take 1 capsule (1 g total) by mouth daily.  . pantoprazole (PROTONIX) 40 MG tablet Take 1 tablet (40 mg total) by mouth daily.  . predniSONE (DELTASONE) 10 MG tablet 6 tablets on Day 1 , then reduce by 1 tablet daily until gone (Patient not taking: Reported on 02/12/2018)  . tamsulosin (FLOMAX) 0.4 MG CAPS capsule Take 1 capsule (0.4 mg total) by mouth daily.  . vitamin B-12 (CYANOCOBALAMIN) 1000 MCG tablet Take 1,000 mcg by mouth daily.   No facility-administered encounter medications on file as of 02/12/2018.     Review of Systems  Constitutional: Negative for appetite change and unexpected weight change.  HENT: Negative for congestion and sinus pressure.   Respiratory: Negative for cough, chest tightness and shortness of breath.   Cardiovascular: Negative for chest pain, palpitations and leg swelling.  Gastrointestinal: Negative for abdominal pain, diarrhea and nausea.  Genitourinary:       Previous urinary frequency and dysuria.  Seeing urology.    Musculoskeletal: Negative for joint swelling and myalgias.       Right leg/hips pain.   Skin: Negative for color change and rash.  Neurological: Negative for dizziness, light-headedness and headaches.  Psychiatric/Behavioral: Negative for agitation and dysphoric mood.       Objective:     Physical Exam Constitutional:      General: He is not in acute distress.    Appearance: Normal appearance. He is well-developed.  HENT:     Mouth/Throat:     Pharynx: No oropharyngeal exudate or posterior oropharyngeal erythema.  Neck:     Musculoskeletal: Neck supple. No muscular tenderness.  Cardiovascular:     Rate and Rhythm: Normal rate and regular rhythm.  Pulmonary:     Effort: Pulmonary effort is normal. No respiratory distress.  Breath sounds: Normal breath sounds.  Abdominal:     General: Bowel sounds are normal.     Palpations: Abdomen is soft.     Tenderness: There is no abdominal tenderness.  Musculoskeletal:        General: No swelling or tenderness.     Comments: No increased pain with SLR.    Skin:    Findings: No erythema or rash.  Neurological:     Mental Status: He is alert.  Psychiatric:        Mood and Affect: Mood normal.        Behavior: Behavior normal.     BP 104/72 (BP Location: Left Arm, Patient Position: Sitting, Cuff Size: Normal)   Pulse 71   Temp 97.9 F (36.6 C) (Oral)   Resp 16   Wt 178 lb 12.8 oz (81.1 kg)   SpO2 96%   BMI 26.40 kg/m  Wt Readings from Last 3 Encounters:  02/12/18 178 lb 12.8 oz (81.1 kg)  02/05/18 175 lb (79.4 kg)  01/30/18 178 lb (80.7 kg)     Lab Results  Component Value Date   WBC 6.9 11/07/2017   HGB 14.4 11/07/2017   HCT 41.8 11/07/2017   PLT 226.0 11/07/2017   GLUCOSE 88 01/30/2018   CHOL 188 11/07/2017   TRIG (H) 11/07/2017    530.0 Triglyceride is over 400; calculations on Lipids are invalid.   HDL 26.20 (L) 11/07/2017   LDLDIRECT 66.0 11/07/2017   LDLCALC 60 04/24/2013   ALT 16 11/07/2017   AST 16 11/07/2017   NA 139 01/30/2018   K 3.9 01/30/2018   CL 103 01/30/2018   CREATININE 1.12 01/30/2018   BUN 18 01/30/2018   CO2 27 01/30/2018   TSH 3.79 01/30/2018   PSA 1.74 11/07/2017   INR 1.09 05/24/2015   HGBA1C 5.5 12/14/2017    Ct Chest Lcs Nodule F/u W/o Contrast  Result Date:  09/14/2017 CLINICAL DATA:  63 year old male former smoker (quit 3 years ago) with 37 pack year history of smoking. Lung cancer screening examination. EXAM: CT CHEST WITHOUT CONTRAST FOR LUNG CANCER SCREENING NODULE FOLLOW-UP TECHNIQUE: Multidetector CT imaging of the chest was performed following the standard protocol without IV contrast. COMPARISON:  Low-dose lung cancer screening chest CT 06/13/2017. FINDINGS: Cardiovascular: Heart size is normal. There is no significant pericardial fluid, thickening or pericardial calcification. There is aortic atherosclerosis, as well as atherosclerosis of the great vessels of the mediastinum and the coronary arteries, including calcified atherosclerotic plaque in the left circumflex coronary arteries. 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 Mediastinum/Nodes: No pathologically enlarged mediastinal or hilar lymph nodes. Please note that accurate exclusion of hilar adenopathy is limited on noncontrast CT scans. Esophagus is unremarkable in appearance. No axillary lymphadenopathy. Lungs/Pleura: The nodule of concern in the periphery of the right upper lobe on the prior study is stable in size on today's examination (axial image 101 of series 3), with a volume derived mean diameter of 8.7 mm. Notably, this nodule also has a central calcification, suggesting a developing granuloma. Other tiny calcified granulomas are noted. No larger more suspicious appearing pulmonary nodules or masses are identified. No acute consolidative airspace disease. No pleural effusions. Mild diffuse bronchial wall thickening with mild centrilobular and moderate paraseptal emphysema. Upper Abdomen: Unremarkable. Musculoskeletal: There are no aggressive appearing lytic or blastic lesions noted in  the visualized portions of the  skeleton. IMPRESSION: 1. Lung-RADS 2S, benign appearance or behavior. Continue annual screening with low-dose chest CT without contrast in 12 months. 2. The "S" modifier above refers to potentially clinically significant non lung cancer related findings. Specifically, there is aortic atherosclerosis, in addition to left circumflex coronary artery disease. Please note that although the presence of coronary artery calcium documents the presence of coronary artery disease, the severity of this disease and any potential stenosis cannot be assessed on this non-gated CT examination. Assessment for potential risk factor modification, dietary therapy or pharmacologic therapy may be warranted, if clinically indicated. 3. Mild diffuse bronchial wall thickening with mild centrilobular and moderate paraseptal emphysema; imaging findings suggestive of underlying COPD. Aortic Atherosclerosis (ICD10-I70.0) and Emphysema (ICD10-J43.9). Electronically Signed   By: Vinnie Langton [name]   On: 09/14/2017 11:25       Assessment & Plan:   Problem List Items Addressed This Visit    Degeneration of cervical intervertebral disc    Recent c-spine xray as outlined.  Was given trial of prednisone.  Has not started.  Is better.  Follow.  Desires no further intervention.        Essential hypertension, benign    Blood pressure under good control.  Continue same medication regimen.  Follow pressures.  Follow metabolic panel.        GERD (gastroesophageal reflux disease)    Controlled on current regimen.  Follow.       Peripheral vascular disease ([place])    Recently saw vascular surgery.  Checked out ok.  Follow.        Pure hypercholesterolemia    On lipitor.  Low cholesterol diet and exercise.  Follow lipid panel and liver function tests.        Right leg pain    Right leg/hip pain.  Desires no further w/up at this time.  Follow.            [name], MD

## 2018-02-16 DIAGNOSIS — H2512 Age-related nuclear cataract, left eye: Secondary | ICD-10-CM | POA: Diagnosis not present

## 2018-02-18 ENCOUNTER — Encounter: Payer: Self-pay | Admitting: Internal Medicine

## 2018-02-18 DIAGNOSIS — M79604 Pain in right leg: Secondary | ICD-10-CM | POA: Insufficient documentation

## 2018-02-18 NOTE — Assessment & Plan Note (Signed)
Recent c-spine xray as outlined.  Was given trial of prednisone.  Has not started.  Is better.  Follow.  Desires no further intervention.

## 2018-02-18 NOTE — Assessment & Plan Note (Signed)
Controlled on current regimen.  Follow.  

## 2018-02-18 NOTE — Assessment & Plan Note (Signed)
Right leg/hip pain.  Desires no further w/up at this time.  Follow.

## 2018-02-18 NOTE — Assessment & Plan Note (Signed)
Recently saw vascular surgery.  Checked out ok.  Follow.

## 2018-02-18 NOTE — Assessment & Plan Note (Signed)
On lipitor.  Low cholesterol diet and exercise.  Follow lipid panel and liver function tests.   

## 2018-02-18 NOTE — Assessment & Plan Note (Signed)
Blood pressure under good control.  Continue same medication regimen.  Follow pressures.  Follow metabolic panel.   

## 2018-02-20 ENCOUNTER — Other Ambulatory Visit: Payer: Self-pay | Admitting: Internal Medicine

## 2018-02-26 ENCOUNTER — Ambulatory Visit: Payer: 59 | Admitting: Internal Medicine

## 2018-03-14 DIAGNOSIS — H2512 Age-related nuclear cataract, left eye: Secondary | ICD-10-CM | POA: Diagnosis not present

## 2018-03-19 ENCOUNTER — Encounter: Payer: Self-pay | Admitting: Urology

## 2018-03-19 ENCOUNTER — Ambulatory Visit: Payer: 59 | Admitting: Urology

## 2018-03-19 ENCOUNTER — Other Ambulatory Visit: Payer: Self-pay | Admitting: Internal Medicine

## 2018-03-19 VITALS — BP 115/75 | HR 80 | Ht 68.0 in | Wt 180.0 lb

## 2018-03-19 DIAGNOSIS — R39198 Other difficulties with micturition: Secondary | ICD-10-CM | POA: Diagnosis not present

## 2018-03-19 DIAGNOSIS — R350 Frequency of micturition: Secondary | ICD-10-CM

## 2018-03-19 DIAGNOSIS — N411 Chronic prostatitis: Secondary | ICD-10-CM | POA: Diagnosis not present

## 2018-03-19 DIAGNOSIS — N1330 Unspecified hydronephrosis: Secondary | ICD-10-CM | POA: Diagnosis not present

## 2018-03-19 LAB — URINALYSIS, COMPLETE
Bilirubin, UA: NEGATIVE
Glucose, UA: NEGATIVE
Ketones, UA: NEGATIVE
Leukocytes, UA: NEGATIVE
Nitrite, UA: NEGATIVE
Protein, UA: NEGATIVE
RBC, UA: NEGATIVE
Specific Gravity, UA: 1.015 (ref 1.005–1.030)
Urobilinogen, Ur: 0.2 mg/dL (ref 0.2–1.0)
pH, UA: 5.5 (ref 5.0–7.5)

## 2018-03-19 LAB — MICROSCOPIC EXAMINATION
BACTERIA UA: NONE SEEN
RBC, UA: NONE SEEN /hpf (ref 0–2)

## 2018-03-19 LAB — BLADDER SCAN AMB NON-IMAGING

## 2018-03-19 MED ORDER — TAMSULOSIN HCL 0.4 MG PO CAPS: 0.8000 mg | ORAL_CAPSULE | Freq: Every day | ORAL | 4 refills | Status: DC

## 2018-03-19 NOTE — Progress Notes (Signed)
03/19/2018 1:43 PM   Travis Gray [birth year] [id number]  Referring provider: [name], MD [address] [place], [place] [address]  Chief Complaint  Patient presents with  . Prostatitis    HPI: I was consulted to assess the patient's discomfort with urination and frequency.  He actually has pain in the penis when he voids but otherwise not.  He is voiding every 30 to 60 minutes and has difficulty holding it for 2 hours.  Since starting CPAP he rarely gets up once a night.  He has no pain with ejaculation  His flow was poor.  Sometimes he hesitates sometimes he stops and starts.  Sometimes he strains.  He does feel empty but 10 minutes later can double void a varying amount  Patient has flow symptoms and pain with urination.  He has increased frequency.  His residual today was 262 mL.  Urine sent for culture.  I will first treat him for prostatitis.  Pathophysiology and treatment discussed.  I called in Mobic and ciprofloxacin.  It turns out he started Flomax 3 weeks ago and his flow is a bit better and he says he has less pain and I will keep him on this.  Ciprofloxacin 500 mg and Mobic 15 mg sent.  He may need cystoscopy in the future and/or urodynamics  2-day Frequency is stable.  Last urine culture negative Patient still not sure if he is emptying.  Still has discomfort in the penis when he urinates. Residual today 192 mL   PMH: Past Medical History:  Diagnosis Date  . Chronic headaches   . Degenerative disc disease, cervical   . Genetic testing 03/20/2017   Multi-Cancer panel (83 genes) @ [facility] - No pathogenic mutations detected  . Hypercholesterolemia   . Hypertension   . Peripheral vascular disease ([place])   . Tobacco abuse     Surgical History: Past Surgical History:  Procedure Laterality Date  . COLONOSCOPY WITH PROPOFOL N/A 11/25/2016   Procedure: COLONOSCOPY WITH PROPOFOL;  Surgeon: Toledo, [name], MD;  Location: [facility];   Service: Endoscopy;  Laterality: N/A;  . ESOPHAGOGASTRODUODENOSCOPY (EGD) WITH PROPOFOL N/A 11/25/2016   Procedure: ESOPHAGOGASTRODUODENOSCOPY (EGD) WITH PROPOFOL;  Surgeon: Toledo, [name], MD;  Location: [facility];  Service: Endoscopy;  Laterality: N/A;  . HERNIA REPAIR     Inguinal,laparoscopic surgery  . PERIPHERAL VASCULAR CATHETERIZATION Right 05/21/2015   Procedure: Lower Extremity Angiography;  Surgeon: Algernon Huxley, MD;  Location: [facility];  Service: Cardiovascular;  Laterality: Right;  . PERIPHERAL VASCULAR CATHETERIZATION  05/21/2015   Procedure: Lower Extremity Intervention;  Surgeon: Algernon Huxley, MD;  Location: [facility];  Service: Cardiovascular;;  . radio-ablated therapy     completed in the pain clinic    Home Medications:  Allergies as of 03/19/2018   No Known Allergies     Medication List       Accurate as of March 19, 2018  1:43 PM. Always use your most recent med list.        amitriptyline 25 MG tablet Commonly known as:  ELAVIL TAKE 1 TABLET BY MOUTH AT BEDTIME   atorvastatin 10 MG tablet Commonly known as:  LIPITOR TAKE 1 TABLET BY MOUTH ONCE DAILY.   ciprofloxacin 500 MG tablet Commonly known as:  CIPRO Take 1 tablet (500 mg total) by mouth every 12 (twelve) hours.   clopidogrel 75 MG tablet Commonly known as:  PLAVIX TAKE 1 TABLET BY MOUTH ONCE DAILY  hydrochlorothiazide 25 MG tablet Commonly known as:  HYDRODIURIL Take 1 tablet (25 mg total) by mouth daily.   losartan 100 MG tablet Commonly known as:  COZAAR Take 1 tablet (100 mg total) by mouth daily.   meloxicam 7.5 MG tablet Commonly known as:  MOBIC Take 2 tablets (15 mg total) by mouth daily.   metoprolol succinate 50 MG 24 hr tablet Commonly known as:  TOPROL-XL TAKE 1 TABLET BY MOUTH ONCE DAILY   omega-3 acid ethyl esters 1 g capsule Commonly known as:  LOVAZA Take 1 capsule (1 g total) by mouth daily.   pantoprazole 40 MG tablet Commonly known  as:  PROTONIX Take 1 tablet (40 mg total) by mouth daily.   predniSONE 10 MG tablet Commonly known as:  DELTASONE 6 tablets on Day 1 , then reduce by 1 tablet daily until gone   tamsulosin 0.4 MG Caps capsule Commonly known as:  FLOMAX Take 1 capsule (0.4 mg total) by mouth daily.   vitamin B-12 1000 MCG tablet Commonly known as:  CYANOCOBALAMIN Take 1,000 mcg by mouth daily.       Allergies: No Known Allergies  Family History: Family History  Problem Relation Age of Onset  . Hypertension Mother   . Breast cancer Mother 39  . Uterine cancer Mother        unconfirmed; dx 51s; TAH/BSO; currently 62  . Hyperlipidemia Father   . Bladder Cancer Paternal Grandfather 67       smoker; deceased 44  . Diabetes Maternal Grandmother   . Non-Hodgkin's lymphoma Paternal Aunt        deceased 60    Social History:  reports that he quit smoking about 2 years ago. His smoking use included cigarettes. He has a 37.00 pack-year smoking history. He has never used smokeless tobacco. He reports current alcohol use. He reports that he does not use drugs.  ROS: UROLOGY Frequent Urination?: Yes Hard to postpone urination?: Yes Burning/pain with urination?: Yes Get up at night to urinate?: No Leakage of urine?: No Urine stream starts and stops?: Yes Trouble starting stream?: Yes Do you have to strain to urinate?: No Blood in urine?: No Urinary tract infection?: No Sexually transmitted disease?: No Injury to kidneys or bladder?: No Painful intercourse?: No Weak stream?: No Erection problems?: No Penile pain?: No  Gastrointestinal Nausea?: No Vomiting?: No Indigestion/heartburn?: No Diarrhea?: No Constipation?: No  Constitutional Fever: No Night sweats?: No Weight loss?: No Fatigue?: No  Skin Skin rash/lesions?: No Itching?: No  Eyes Blurred vision?: No Double vision?: No  Ears/Nose/Throat Sore throat?: No Sinus problems?: No  Hematologic/Lymphatic Swollen glands?:  No Easy bruising?: No  Cardiovascular Leg swelling?: No Chest pain?: No  Respiratory Cough?: No Shortness of breath?: No  Endocrine Excessive thirst?: No  Musculoskeletal Back pain?: Yes Joint pain?: No  Neurological Headaches?: No Dizziness?: No  Psychologic Depression?: No Anxiety?: No  Physical Exam: BP 115/75 (BP Location: Left Arm, Patient Position: Sitting)   Pulse 80   Ht 5\' 8"  (1.727 m)   Wt 180 lb (81.6 kg) Comment: per pt  BMI 27.37 kg/m   Constitutional:  Alert and oriented, No acute distress.   Laboratory Data: Lab Results  Component Value Date   WBC 6.9 11/07/2017   HGB 14.4 11/07/2017   HCT 41.8 11/07/2017   MCV 93.2 11/07/2017   PLT 226.0 11/07/2017    Lab Results  Component Value Date   CREATININE 1.12 01/30/2018    Lab Results  Component Value  Date   PSA 1.74 11/07/2017   PSA 2.03 08/22/2016   PSA 1.08 12/11/2014    No results found for: TESTOSTERONE  Lab Results  Component Value Date   HGBA1C 5.5 12/14/2017    Urinalysis    Component Value Date/Time   COLORURINE YELLOW 08/22/2016 1600   APPEARANCEUR Clear 02/05/2018 1123   LABSPEC 1.015 08/22/2016 1600   LABSPEC 1.025 05/21/2012 1456   PHURINE 6.0 08/22/2016 1600   GLUCOSEU Negative 02/05/2018 1123   GLUCOSEU NEGATIVE 08/22/2016 1600   HGBUR NEGATIVE 08/22/2016 1600   BILIRUBINUR Negative 02/05/2018 1123   BILIRUBINUR Negative 05/21/2012 1456   KETONESUR NEGATIVE 08/22/2016 1600   PROTEINUR Negative 02/05/2018 1123   PROTEINUR Negative 05/21/2012 1456   UROBILINOGEN 0.2 01/09/2018 1049   UROBILINOGEN 0.2 08/22/2016 1600   NITRITE Negative 02/05/2018 1123   NITRITE NEGATIVE 08/22/2016 1600   LEUKOCYTESUR Negative 02/05/2018 1123   LEUKOCYTESUR Negative 05/21/2012 1456    Pertinent Imaging:   Assessment & Plan: I double his max of 0.8 mg.  Come back for cystoscopy.  Get ultrasound for silent hydronephrosis.  He understands it in the future he likely will need  urodynamics.  I want to rule out a stricture or any bladder issues causing discomfort prior  1. Prostatitis, chronic  - Urinalysis, Complete - BLADDER SCAN AMB NON-IMAGING   Return in about 4 weeks (around 04/16/2018) for cysto w/Macdarmid.  Reece Packer, MD  [facility] [address], [place], [place] [address] [phone]

## 2018-03-19 NOTE — Patient Instructions (Signed)
Cystoscopy    Cystoscopy is a procedure that is used to help diagnose and sometimes treat conditions that affect that lower urinary tract. The lower urinary tract includes the bladder and the tube that drains urine from the bladder out of the body (urethra). Cystoscopy is performed with a thin, tube-shaped instrument with a light and camera at the end (cystoscope). The cystoscope may be hard (rigid) or flexible, depending on the goal of the procedure.The cystoscope is inserted through the urethra, into the bladder.  Cystoscopy may be recommended if you have:   Urinary tractinfections that keep coming back (recurring).   Blood in the urine (hematuria).   Loss of bladder control (urinary incontinence) or an overactive bladder.   Unusual cells found in a urine sample.   A blockage in the urethra.   Painful urination.   An abnormality in the bladder found during an intravenous pyelogram (IVP) or CT scan.  Cystoscopy may also be done to remove a sample of tissue to be examined under a microscope (biopsy).  Tell a health care provider about:   Any allergies you have.   All medicines you are taking, including vitamins, herbs, eye drops, creams, and over-the-counter medicines.   Any problems you or family members have had with anesthetic medicines.   Any blood disorders you have.   Any surgeries you have had.   Any medical conditions you have.   Whether you are pregnant or may be pregnant.  What are the risks?  Generally, this is a safe procedure. However, problems may occur, including:   Infection.   Bleeding.   Allergic reactions to medicines.   Damage to other structures or organs.  What happens before the procedure?   Ask your health care provider about:  ? Changing or stopping your regular medicines. This is especially important if you are taking diabetes medicines or blood thinners.  ? Taking medicines such as aspirin and ibuprofen. These medicines can thin your blood. Do not take these medicines  before your procedure if your health care provider instructs you not to.   Follow instructions from your health care provider about eating or drinking restrictions.   You may be given antibiotic medicine to help prevent infection.   You may have an exam or testing, such as X-rays of the bladder, urethra, or kidneys.   You may have urine tests to check for signs of infection.   Plan to have someone take you home after the procedure.  What happens during the procedure?   To reduce your risk of infection,your health care team will wash or sanitize their hands.   You will be given one or more of the following:  ? A medicine to help you relax (sedative).  ? A medicine to numb the area (local anesthetic).   The area around the opening of your urethra will be cleaned.   The cystoscope will be passed through your urethra into your bladder.   Germ-free (sterile)fluid will flow through the cystoscope to fill your bladder. The fluid will stretch your bladder so that your surgeon can clearly examine your bladder walls.   The cystoscope will be removed and your bladder will be emptied.  The procedure may vary among health care providers and hospitals.  What happens after the procedure?   You may have some soreness or pain in your abdomen and urethra. Medicines will be available to help you.   You may have some blood in your urine.   Do not drive for   24 hours if you received a sedative.  This information is not intended to replace advice given to you by your health care provider. Make sure you discuss any questions you have with your health care provider.  Document Released: 01/08/2000 Document Revised: 10/21/2016 Document Reviewed: 11/27/2014  [employer]vier Interactive Patient Education  2019 [employer]

## 2018-03-22 ENCOUNTER — Encounter: Payer: Self-pay | Admitting: Internal Medicine

## 2018-03-22 ENCOUNTER — Ambulatory Visit: Payer: 59 | Admitting: Internal Medicine

## 2018-03-22 ENCOUNTER — Ambulatory Visit (INDEPENDENT_AMBULATORY_CARE_PROVIDER_SITE_OTHER): Payer: 59

## 2018-03-22 VITALS — BP 108/70 | HR 61 | Temp 98.6°F | Resp 16 | Wt 182.0 lb

## 2018-03-22 DIAGNOSIS — M545 Low back pain, unspecified: Secondary | ICD-10-CM

## 2018-03-22 DIAGNOSIS — M25551 Pain in right hip: Secondary | ICD-10-CM

## 2018-03-22 DIAGNOSIS — M25552 Pain in left hip: Secondary | ICD-10-CM | POA: Diagnosis not present

## 2018-03-22 DIAGNOSIS — K219 Gastro-esophageal reflux disease without esophagitis: Secondary | ICD-10-CM

## 2018-03-22 DIAGNOSIS — I739 Peripheral vascular disease, unspecified: Secondary | ICD-10-CM

## 2018-03-22 DIAGNOSIS — D649 Anemia, unspecified: Secondary | ICD-10-CM

## 2018-03-22 DIAGNOSIS — I10 Essential (primary) hypertension: Secondary | ICD-10-CM

## 2018-03-22 DIAGNOSIS — E7800 Pure hypercholesterolemia, unspecified: Secondary | ICD-10-CM

## 2018-03-22 DIAGNOSIS — M47816 Spondylosis without myelopathy or radiculopathy, lumbar region: Secondary | ICD-10-CM | POA: Diagnosis not present

## 2018-03-22 DIAGNOSIS — M4186 Other forms of scoliosis, lumbar region: Secondary | ICD-10-CM | POA: Diagnosis not present

## 2018-03-22 DIAGNOSIS — I700 Atherosclerosis of aorta: Secondary | ICD-10-CM | POA: Diagnosis not present

## 2018-03-22 MED ORDER — PANTOPRAZOLE SODIUM 40 MG PO TBEC: 40.0000 mg | DELAYED_RELEASE_TABLET | Freq: Every day | ORAL | 5 refills | Status: DC

## 2018-03-22 MED ORDER — AMITRIPTYLINE HCL 25 MG PO TABS: 25.0000 mg | ORAL_TABLET | Freq: Every day | ORAL | 5 refills | Status: DC

## 2018-03-22 MED ORDER — ATORVASTATIN CALCIUM 10 MG PO TABS: 10.0000 mg | ORAL_TABLET | Freq: Every day | ORAL | 5 refills | Status: DC

## 2018-03-22 NOTE — Progress Notes (Signed)
Patient ID: Travis Gray, male   DOB: 05-Jun-1955, 63 y.o.   MRN: [id number]   Subjective:    Patient ID: Travis Gray, male    DOB: May 12, 1955, 63 y.o.   MRN: [id number]  HPI  Patient here for a scheduled follow up.  He reports increased lower back and bilateral hip pain.  Present for a while.  Increased.  Limits his activity.  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  His vision limits his work.  The pain limiting his activity and work that he can do.  No pain radiating down his legs.  Has been having issues with his prostate and urination. Seeing urology.  Diagnosed with chronic prostatitis.  Has been having discomfort with urination and increased frequency.  On flomax.  Dose just increased.  Planning to f/u with ultrasound next week and cystoscopy in the future.  Still with discomfort.  Just increased flomax dose.  No fever.  No chest pain.  No sob.  No acid reflux.  No abdominal pain.  Bowels moving.     Past Medical History:  Diagnosis Date  . Chronic headaches   . Degenerative disc disease, cervical   . Genetic testing 03/20/2017   Multi-Cancer panel (83 genes) @ [facility] - No pathogenic mutations detected  . Hypercholesterolemia   . Hypertension   . Peripheral vascular disease ([place])   . Tobacco abuse    Past Surgical History:  Procedure Laterality Date  . COLONOSCOPY WITH PROPOFOL N/A 11/25/2016   Procedure: COLONOSCOPY WITH PROPOFOL;  Surgeon: Toledo, [name], MD;  Location: [facility] END[facility]COPY;  Service: Endoscopy;  Laterality: N/A;  . ESOPHAGOGASTRODUODENOSCOPY (EGD) WITH PROPOFOL N/A 11/25/2016   Procedure: ESOPHAGOGASTRODUODENOSCOPY (EGD) WITH PROPOFOL;  Surgeon: Toledo, [name], MD;  Location: [facility] ENDOSCOPY;  Service: Endoscopy;  Laterality: N/A;  . HERNIA REPAIR     Inguinal,laparoscopic surgery  . PERIPHERAL VASCULAR CATHETERIZATION Right 05/21/2015   Procedure: Lower Extremity Angiography;  Surgeon: Algernon Huxley, MD;  Location: [place] [facility];  Service: Cardiovascular;  Laterality: Right;  . PERIPHERAL VASCULAR CATHETERIZATION  05/21/2015   Procedure: Lower Extremity Intervention;  Surgeon: Algernon Huxley, MD;  Location: [place] [facility];  Service: Cardiovascular;;  . radio-ablated therapy     completed in the pain clinic   Family History  Problem Relation Age of Onset  . Hypertension Mother   . Breast cancer Mother 80  . Uterine cancer Mother        unconfirmed; dx 56s; TAH/BSO; currently 64  . Hyperlipidemia Father   . Bladder Cancer Paternal Grandfather 2       smoker; deceased 58  . Diabetes Maternal Grandmother   . Non-Hodgkin's lymphoma Paternal Aunt        deceased 81   Social History   Socioeconomic History  . Marital status: Married    Spouse name: Not on file  . Number of children: Not on file  . Years of education: Not on file  . Highest education level: Not on file  Occupational History  . Not on file  Social Needs  . Financial resource strain: Not on file  . Food insecurity:    Worry: Not on file    Inability: Not on file  . Transportation needs:    Medical: Not on file    Non-medical: Not on file  Tobacco Use  . Smoking status: Former Smoker    Packs/day: 1.00    Years:  37.00    Pack years: 37.00    Types: Cigarettes    Last attempt to quit: 05/25/2015    Years since quitting: 2.8  . Smokeless tobacco: Never Used  Substance and Sexual Activity  . Alcohol use: Yes    Alcohol/week: 0.0 standard drinks    Comment: very occasional alcohol use  . Drug use: No  . Sexual activity: Not on file  Lifestyle  . Physical activity:    Days per week: Not on file    Minutes per session: Not on file  . Stress: Not on file  Relationships  . Social connections:    Talks on phone: Not on file    Gets together: Not on file    Attends religious service: Not on file    Active member of club or organization: Not on file    Attends meetings of clubs or organizations: Not on file     Relationship status: Not on file  Other Topics Concern  . Not on file  Social History Narrative  . Not on file    Outpatient Encounter Medications as of 03/22/2018  Medication Sig  . amitriptyline (ELAVIL) 25 MG tablet Take 1 tablet (25 mg total) by mouth at bedtime.  Marland Kitchen atorvastatin (LIPITOR) 10 MG tablet Take 1 tablet (10 mg total) by mouth daily.  . clopidogrel (PLAVIX) 75 MG tablet TAKE 1 TABLET BY MOUTH ONCE DAILY  . hydrochlorothiazide (HYDRODIURIL) 25 MG tablet Take 1 tablet (25 mg total) by mouth daily.  Marland Kitchen losartan (COZAAR) 100 MG tablet Take 1 tablet (100 mg total) by mouth daily.  . metoprolol succinate (TOPROL-XL) 50 MG 24 hr tablet TAKE 1 TABLET BY MOUTH ONCE DAILY  . omega-3 acid ethyl esters (LOVAZA) 1 g capsule Take 1 capsule (1 g total) by mouth daily.  . pantoprazole (PROTONIX) 40 MG tablet Take 1 tablet (40 mg total) by mouth daily.  . tamsulosin (FLOMAX) 0.4 MG CAPS capsule Take 2 capsules (0.8 mg total) by mouth daily.  . vitamin B-12 (CYANOCOBALAMIN) 1000 MCG tablet Take 1,000 mcg by mouth daily.  . [DISCONTINUED] amitriptyline (ELAVIL) 25 MG tablet TAKE 1 TABLET BY MOUTH AT BEDTIME  . [DISCONTINUED] atorvastatin (LIPITOR) 10 MG tablet TAKE 1 TABLET BY MOUTH ONCE DAILY.  . [DISCONTINUED] ciprofloxacin (CIPRO) 500 MG tablet Take 1 tablet (500 mg total) by mouth every 12 (twelve) hours. (Patient not taking: Reported on 03/19/2018)  . [DISCONTINUED] meloxicam (MOBIC) 7.5 MG tablet Take 2 tablets (15 mg total) by mouth daily. (Patient not taking: Reported on 03/19/2018)  . [DISCONTINUED] pantoprazole (PROTONIX) 40 MG tablet Take 1 tablet (40 mg total) by mouth daily.  . [DISCONTINUED] predniSONE (DELTASONE) 10 MG tablet 6 tablets on Day 1 , then reduce by 1 tablet daily until gone  . [DISCONTINUED] tamsulosin (FLOMAX) 0.4 MG CAPS capsule Take 1 capsule (0.4 mg total) by mouth daily.   No facility-administered encounter medications on file as of 03/22/2018.     Review of  Systems  Constitutional: Negative for appetite change and unexpected weight change.  HENT: Negative for congestion and sinus pressure.   Respiratory: Negative for cough, chest tightness and shortness of breath.   Cardiovascular: Negative for chest pain, palpitations and leg swelling.  Gastrointestinal: Negative for abdominal pain, diarrhea, nausea and vomiting.  Genitourinary:       Discomfort with urination and increased frequency.    Musculoskeletal: Negative for joint swelling and myalgias.       Bilateral hip pain as outlined.  Skin: Negative for color change and rash.  Neurological: Negative for dizziness, light-headedness and headaches.  Psychiatric/Behavioral: Negative for agitation and dysphoric mood.       Objective:    Physical Exam Constitutional:      General: He is not in acute distress.    Appearance: Normal appearance. He is well-developed.  HENT:     Nose: Nose normal. No congestion.     Mouth/Throat:     Pharynx: No oropharyngeal exudate or posterior oropharyngeal erythema.  Cardiovascular:     Rate and Rhythm: Normal rate and regular rhythm.  Pulmonary:     Effort: Pulmonary effort is normal. No respiratory distress.     Breath sounds: Normal breath sounds.  Abdominal:     General: Bowel sounds are normal.     Palpations: Abdomen is soft.     Tenderness: There is no abdominal tenderness.  Musculoskeletal:        General: No swelling or tenderness.     Comments: Negative SLR.  Increased pain with standing.    Skin:    Findings: No erythema or rash.  Neurological:     Mental Status: He is alert.  Psychiatric:        Mood and Affect: Mood normal.        Behavior: Behavior normal.     BP 108/70   Pulse 61   Temp 98.6 F (37 C) (Oral)   Resp 16   Wt 182 lb (82.6 kg)   SpO2 97%   BMI 27.67 kg/m  Wt Readings from Last 3 Encounters:  03/22/18 182 lb (82.6 kg)  03/19/18 180 lb (81.6 kg)  02/12/18 178 lb 12.8 oz (81.1 kg)     Lab Results    Component Value Date   WBC 6.9 11/07/2017   HGB 14.4 11/07/2017   HCT 41.8 11/07/2017   PLT 226.0 11/07/2017   GLUCOSE 88 01/30/2018   CHOL 188 11/07/2017   TRIG (H) 11/07/2017    530.0 Triglyceride is over 400; calculations on Lipids are invalid.   HDL 26.20 (L) 11/07/2017   LDLDIRECT 66.0 11/07/2017   LDLCALC 60 04/24/2013   ALT 16 11/07/2017   AST 16 11/07/2017   NA 139 01/30/2018   K 3.9 01/30/2018   CL 103 01/30/2018   CREATININE 1.12 01/30/2018   BUN 18 01/30/2018   CO2 27 01/30/2018   TSH 3.79 01/30/2018   PSA 1.74 11/07/2017   INR 1.09 05/24/2015   HGBA1C 5.5 12/14/2017    Ct Chest Lcs Nodule F/u W/o Contrast  Result Date: 09/14/2017 CLINICAL DATA:  63 year old male former smoker (quit 3 years ago) with 37 pack year history of smoking. Lung cancer screening examination. EXAM: CT CHEST WITHOUT CONTRAST FOR LUNG CANCER SCREENING NODULE FOLLOW-UP TECHNIQUE: Multidetector CT imaging of the chest was performed following the standard protocol without IV contrast. COMPARISON:  Low-dose lung cancer screening chest CT 06/13/2017. FINDINGS: Cardiovascular: Heart size is normal. There is no significant pericardial fluid, thickening or pericardial calcification. There is aortic atherosclerosis, as well as atherosclerosis of the great vessels of the mediastinum and the coronary arteries, including calcified atherosclerotic plaque in the left circumflex coronary arteries. 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 Mediastinum/Nodes: No pathologically enlarged mediastinal or hilar lymph nodes. Please note that accurate exclusion of hilar adenopathy is limited on noncontrast CT scans. Esophagus is unremarkable  in appearance. No axillary lymphadenopathy. Lungs/Pleura: The nodule of concern in the periphery of the right upper  lobe on the prior study is stable in size on today's examination (axial image 101 of series 3), with a volume derived mean diameter of 8.7 mm. Notably, this nodule also has a central calcification, suggesting a developing granuloma. Other tiny calcified granulomas are noted. No larger more suspicious appearing pulmonary nodules or masses are identified. No acute consolidative airspace disease. No pleural effusions. Mild diffuse bronchial wall thickening with mild centrilobular and moderate paraseptal emphysema. Upper Abdomen: Unremarkable. Musculoskeletal: There are no aggressive appearing lytic or blastic lesions noted in the visualized portions of the skeleton. IMPRESSION: 1. Lung-RADS 2S, benign appearance or behavior. Continue annual screening with low-dose chest CT without contrast in 12 months. 2. The "S" modifier above refers to potentially clinically significant non lung cancer related findings. Specifically, there is aortic atherosclerosis, in addition to left circumflex coronary artery disease. Please note that although the presence of coronary artery calcium documents the presence of coronary artery disease, the severity of this disease and any potential stenosis cannot be assessed on this non-gated CT examination. Assessment for potential risk factor modification, dietary therapy or pharmacologic therapy may be warranted, if clinically indicated. 3. Mild diffuse bronchial wall thickening with mild centrilobular and moderate paraseptal emphysema; imaging findings suggestive of underlying COPD. Aortic Atherosclerosis (ICD10-I70.0) and Emphysema (ICD10-J43.9). Electronically Signed   By: Vinnie Langton [name]   On: 09/14/2017 11:25       Assessment & Plan:   Problem List Items Addressed This Visit    Anemia    Follow cbc.       Aortic atherosclerosis (HCC)    On lipitor.        Relevant Medications   atorvastatin (LIPITOR) 10 MG tablet   Back pain - Primary    Increased low back pain and  bilateral hip pain.  Increased pain with standing or walking.  Unable to work.  Taking otc medication.  Sitting and lying help.  PT pulses palpable and equal bilaterally.  Appears to be more msk.  Check L-S spine xray and hip xrays.  May need ortho evaluation.        Relevant Orders   DG Lumbar Spine 2-3 Views (Completed)   DG HIP UNILAT WITH PELVIS 2-3 VIEWS LEFT (Completed)   DG HIP UNILAT WITH PELVIS 2-3 VIEWS RIGHT (Completed)   Essential hypertension, benign    Blood pressure under good control.  Continue same medication regimen.  Follow pressures.  Follow metabolic panel.        Relevant Medications   atorvastatin (LIPITOR) 10 MG tablet   Other Relevant Orders   Hemoglobin H4V   Basic metabolic panel   GERD (gastroesophageal reflux disease)    Controlled on current regimen.  Follow.       Relevant Medications   pantoprazole (PROTONIX) 40 MG tablet   Peripheral vascular disease ([place])    Seeing vascular surgery.  Is s/p bilateral iliac stents.  Just had ABIs right - ok.  PT pulses palpable and equal bilaterally.  Feel his pain is more msk.  Xray as outlined.  Follow.        Relevant Medications   atorvastatin (LIPITOR) 10 MG tablet   Pure hypercholesterolemia    On lipitor.  Low cholesterol diet and exercise.  Follow lipid panel and liver function tests.        Relevant Medications   atorvastatin (LIPITOR) 10 MG tablet   Other  Relevant Orders   Lipid panel   Hepatic function panel    Other Visit Diagnoses    Pain of both hip joints       Relevant Orders   DG Lumbar Spine 2-3 Views (Completed)   DG HIP UNILAT WITH PELVIS 2-3 VIEWS LEFT (Completed)   DG HIP UNILAT WITH PELVIS 2-3 VIEWS RIGHT (Completed)       [name], MD

## 2018-03-23 ENCOUNTER — Other Ambulatory Visit: Payer: Self-pay | Admitting: Internal Medicine

## 2018-03-23 DIAGNOSIS — M25551 Pain in right hip: Secondary | ICD-10-CM

## 2018-03-23 DIAGNOSIS — M545 Low back pain, unspecified: Secondary | ICD-10-CM

## 2018-03-23 DIAGNOSIS — M25552 Pain in left hip: Secondary | ICD-10-CM

## 2018-03-23 NOTE — Progress Notes (Signed)
Order placed for ortho referral.   

## 2018-03-24 ENCOUNTER — Encounter: Payer: Self-pay | Admitting: Internal Medicine

## 2018-03-24 NOTE — Assessment & Plan Note (Signed)
Seeing vascular surgery.  Is s/p bilateral iliac stents.  Just had ABIs right - ok.  PT pulses palpable and equal bilaterally.  Feel his pain is more msk.  Xray as outlined.  Follow.

## 2018-03-24 NOTE — Assessment & Plan Note (Signed)
On lipitor.  Low cholesterol diet and exercise.  Follow lipid panel and liver function tests.   

## 2018-03-24 NOTE — Assessment & Plan Note (Signed)
Follow cbc.  

## 2018-03-24 NOTE — Assessment & Plan Note (Signed)
On lipitor

## 2018-03-24 NOTE — Assessment & Plan Note (Signed)
Blood pressure under good control.  Continue same medication regimen.  Follow pressures.  Follow metabolic panel.   

## 2018-03-24 NOTE — Assessment & Plan Note (Signed)
Increased low back pain and bilateral hip pain.  Increased pain with standing or walking.  Unable to work.  Taking otc medication.  Sitting and lying help.  PT pulses palpable and equal bilaterally.  Appears to be more msk.  Check L-S spine xray and hip xrays.  May need ortho evaluation.

## 2018-03-24 NOTE — Assessment & Plan Note (Signed)
Controlled on current regimen.  Follow.  

## 2018-03-28 ENCOUNTER — Ambulatory Visit
Admission: RE | Admit: 2018-03-28 | Discharge: 2018-03-28 | Disposition: A | Discharge status: Home / Self Care | Payer: 59 | Source: Ambulatory Visit | Attending: Urology | Admitting: Urology

## 2018-03-28 ENCOUNTER — Other Ambulatory Visit: Payer: Self-pay

## 2018-03-28 DIAGNOSIS — N411 Chronic prostatitis: Secondary | ICD-10-CM | POA: Insufficient documentation

## 2018-03-28 DIAGNOSIS — N1330 Unspecified hydronephrosis: Secondary | ICD-10-CM | POA: Diagnosis not present

## 2018-03-29 ENCOUNTER — Encounter: Payer: Self-pay | Admitting: *Deleted

## 2018-03-29 ENCOUNTER — Other Ambulatory Visit: Payer: Self-pay

## 2018-03-30 ENCOUNTER — Telehealth: Payer: Self-pay

## 2018-03-30 NOTE — Telephone Encounter (Signed)
-----   Message from Abbie Sons, MD sent at 03/29/2018  9:33 AM EST ----- Renal ultrasound showed no hydronephrosis.  He does have an elevated residual.  Keep follow-up with Dr. Matilde Sprang

## 2018-04-05 DIAGNOSIS — M25551 Pain in right hip: Secondary | ICD-10-CM | POA: Diagnosis not present

## 2018-04-05 DIAGNOSIS — M25552 Pain in left hip: Secondary | ICD-10-CM | POA: Diagnosis not present

## 2018-04-05 DIAGNOSIS — M1611 Unilateral primary osteoarthritis, right hip: Secondary | ICD-10-CM | POA: Diagnosis not present

## 2018-04-05 NOTE — Discharge Instructions (Signed)

## 2018-04-06 DIAGNOSIS — M1611 Unilateral primary osteoarthritis, right hip: Secondary | ICD-10-CM | POA: Diagnosis not present

## 2018-04-11 ENCOUNTER — Ambulatory Visit
Admission: RE | Admit: 2018-04-11 | Discharge: 2018-04-11 | Disposition: A | Discharge status: Home / Self Care | Payer: 59 | Attending: Ophthalmology | Admitting: Ophthalmology

## 2018-04-11 ENCOUNTER — Other Ambulatory Visit: Payer: Self-pay

## 2018-04-11 ENCOUNTER — Ambulatory Visit: Payer: 59 | Admitting: Anesthesiology

## 2018-04-11 ENCOUNTER — Encounter
Admission: RE | Disposition: A | Discharge status: Home / Self Care | Payer: Self-pay | Source: Home / Self Care | Attending: Ophthalmology

## 2018-04-11 DIAGNOSIS — I739 Peripheral vascular disease, unspecified: Secondary | ICD-10-CM | POA: Diagnosis not present

## 2018-04-11 DIAGNOSIS — K219 Gastro-esophageal reflux disease without esophagitis: Secondary | ICD-10-CM | POA: Diagnosis not present

## 2018-04-11 DIAGNOSIS — H2512 Age-related nuclear cataract, left eye: Secondary | ICD-10-CM | POA: Diagnosis not present

## 2018-04-11 DIAGNOSIS — H25812 Combined forms of age-related cataract, left eye: Secondary | ICD-10-CM | POA: Diagnosis not present

## 2018-04-11 DIAGNOSIS — Z87891 Personal history of nicotine dependence: Secondary | ICD-10-CM | POA: Insufficient documentation

## 2018-04-11 DIAGNOSIS — Z79899 Other long term (current) drug therapy: Secondary | ICD-10-CM | POA: Insufficient documentation

## 2018-04-11 DIAGNOSIS — Z9841 Cataract extraction status, right eye: Secondary | ICD-10-CM | POA: Insufficient documentation

## 2018-04-11 DIAGNOSIS — G4730 Sleep apnea, unspecified: Secondary | ICD-10-CM | POA: Insufficient documentation

## 2018-04-11 DIAGNOSIS — I10 Essential (primary) hypertension: Secondary | ICD-10-CM | POA: Diagnosis not present

## 2018-04-11 DIAGNOSIS — Z95820 Peripheral vascular angioplasty status with implants and grafts: Secondary | ICD-10-CM | POA: Diagnosis not present

## 2018-04-11 DIAGNOSIS — Z7902 Long term (current) use of antithrombotics/antiplatelets: Secondary | ICD-10-CM | POA: Diagnosis not present

## 2018-04-11 HISTORY — DX: Sleep apnea, unspecified: G47.30

## 2018-04-11 HISTORY — PX: CATARACT EXTRACTION W/PHACO: SHX586

## 2018-04-11 HISTORY — DX: Gastro-esophageal reflux disease without esophagitis: K21.9

## 2018-04-11 SURGERY — PHACOEMULSIFICATION, CATARACT, WITH IOL INSERTION
Anesthesia: Monitor Anesthesia Care | Site: Eye | Laterality: Left

## 2018-04-11 MED ORDER — TETRACAINE HCL 0.5 % OP SOLN
1.0000 [drp] | OPHTHALMIC | Status: DC | PRN
  Administered 2018-04-11 (×3): 1 [drp] via OPHTHALMIC

## 2018-04-11 MED ORDER — BRIMONIDINE TARTRATE-TIMOLOL 0.2-0.5 % OP SOLN
OPHTHALMIC | Status: DC | PRN
  Administered 2018-04-11: 1 [drp] via OPHTHALMIC

## 2018-04-11 MED ORDER — ONDANSETRON HCL 4 MG/2ML IJ SOLN: 4.0000 mg | Freq: Once | INTRAMUSCULAR | Status: DC | PRN

## 2018-04-11 MED ORDER — ARMC OPHTHALMIC DILATING DROPS
1.0000 "application " | OPHTHALMIC | Status: DC | PRN
  Administered 2018-04-11 (×3): 1 via OPHTHALMIC

## 2018-04-11 MED ORDER — FENTANYL CITRATE (PF) 100 MCG/2ML IJ SOLN
INTRAMUSCULAR | Status: DC | PRN
  Administered 2018-04-11: 100 ug via INTRAVENOUS

## 2018-04-11 MED ORDER — PHENYLEPHRINE-KETOROLAC 1-0.3 % IO SOLN
INTRAOCULAR | Status: DC | PRN
  Administered 2018-04-11: 49 mL via OPHTHALMIC

## 2018-04-11 MED ORDER — MOXIFLOXACIN HCL 0.5 % OP SOLN
1.0000 [drp] | OPHTHALMIC | Status: DC | PRN
  Administered 2018-04-11 (×3): 1 [drp] via OPHTHALMIC

## 2018-04-11 MED ORDER — LACTATED RINGERS IV SOLN: 10.0000 mL/h | INTRAVENOUS | Status: DC

## 2018-04-11 MED ORDER — LIDOCAINE HCL (PF) 2 % IJ SOLN
INTRAOCULAR | Status: DC | PRN
  Administered 2018-04-11: 1 mL

## 2018-04-11 MED ORDER — CEFUROXIME OPHTHALMIC INJECTION 1 MG/0.1 ML
INJECTION | OPHTHALMIC | Status: DC | PRN
  Administered 2018-04-11: 0.1 mL via INTRACAMERAL

## 2018-04-11 MED ORDER — MIDAZOLAM HCL 2 MG/2ML IJ SOLN
INTRAMUSCULAR | Status: DC | PRN
  Administered 2018-04-11: 2 mg via INTRAVENOUS

## 2018-04-11 MED ORDER — NA HYALUR & NA CHOND-NA HYALUR 0.4-0.35 ML IO KIT
PACK | INTRAOCULAR | Status: DC | PRN
  Administered 2018-04-11: 1 mL via INTRAOCULAR

## 2018-04-11 SURGICAL SUPPLY — 20 items
CANNULA ANT/CHMB 27G (MISCELLANEOUS) ×1 IMPLANT
CANNULA ANT/CHMB 27GA (MISCELLANEOUS) ×3 IMPLANT
GLOVE SURG LX 7.5 STRW (GLOVE) ×2
GLOVE SURG LX STRL 7.5 STRW (GLOVE) ×1 IMPLANT
GLOVE SURG TRIUMPH 8.0 PF LTX (GLOVE) ×3 IMPLANT
GOWN STRL REUS W/ TWL LRG LVL3 (GOWN DISPOSABLE) ×2 IMPLANT
GOWN STRL REUS W/TWL LRG LVL3 (GOWN DISPOSABLE) ×4
LENS IOL TECNIS ITEC 18.0 (Intraocular Lens) ×2 IMPLANT
MARKER SKIN DUAL TIP RULER LAB (MISCELLANEOUS) ×3 IMPLANT
NDL FILTER BLUNT 18X1 1/2 (NEEDLE) ×1 IMPLANT
NEEDLE FILTER BLUNT 18X 1/2SAF (NEEDLE) ×2
NEEDLE FILTER BLUNT 18X1 1/2 (NEEDLE) ×1 IMPLANT
PACK CATARACT BRASINGTON (MISCELLANEOUS) ×3 IMPLANT
PACK EYE AFTER SURG (MISCELLANEOUS) ×3 IMPLANT
PACK OPTHALMIC (MISCELLANEOUS) ×3 IMPLANT
SYR 3ML LL SCALE MARK (SYRINGE) ×3 IMPLANT
SYR 5ML LL (SYRINGE) ×3 IMPLANT
SYR TB 1ML LUER SLIP (SYRINGE) ×3 IMPLANT
WATER STERILE IRR 500ML POUR (IV SOLUTION) ×3 IMPLANT
WIPE NON LINTING 3.25X3.25 (MISCELLANEOUS) ×3 IMPLANT

## 2018-04-11 NOTE — Transfer of Care (Signed)
Immediate Anesthesia Transfer of Care Note  Patient: Travis Gray  Procedure(s) Performed: CATARACT EXTRACTION PHACO AND INTRAOCULAR LENS PLACEMENT (IOC)  COMPLICATED LEFT (Left Eye)  Patient Location: PACU  Anesthesia Type: MAC  Level of Consciousness: awake, alert  and patient cooperative  Airway and Oxygen Therapy: Patient Spontanous Breathing and Patient connected to supplemental oxygen  Post-op Assessment: Post-op Vital signs reviewed, Patient's Cardiovascular Status Stable, Respiratory Function Stable, Patent Airway and No signs of Nausea or vomiting  Post-op Vital Signs: Reviewed and stable  Complications: No apparent anesthesia complications

## 2018-04-11 NOTE — Anesthesia Procedure Notes (Signed)
Procedure Name: MAC Performed by: Jamya Starry M, CRNA Pre-anesthesia Checklist: Timeout performed, Patient being monitored, Suction available, Emergency Drugs available and Patient identified Patient Re-evaluated:Patient Re-evaluated prior to induction Oxygen Delivery Method: Nasal cannula       

## 2018-04-11 NOTE — Op Note (Signed)
OPERATIVE NOTE  Travis Gray [id number] 04/11/2018   PREOPERATIVE DIAGNOSIS:  Nuclear sclerotic cataract left eye. H25.12   POSTOPERATIVE DIAGNOSIS:    Nuclear sclerotic cataract left eye.     PROCEDURE:  Phacoemusification with posterior chamber intraocular lens placement of the left eye   LENS:   Implant Name Type Inv. Item Serial No. Manufacturer Lot No. LRB No. Used  LENS IOL DIOP 18.0 - [id number] Intraocular Lens LENS IOL DIOP 18.0 [id number]  Left 1        ULTRASOUND TIME: 13  % of 0 minutes 50 seconds, CDE 6.4  SURGEON:  Wyonia Hough, MD   ANESTHESIA:  Topical with tetracaine drops and 2% Xylocaine jelly, augmented with 1% preservative-free intracameral lidocaine.    COMPLICATIONS:  None.   DESCRIPTION OF PROCEDURE:  The patient was identified in the holding room and transported to the operating room and placed in the supine position under the operating microscope.  The left eye was identified as the operative eye and it was prepped and draped in the usual sterile ophthalmic fashion.   A 1 millimeter clear-corneal paracentesis was made at the 1:30 position.  0.5 ml of preservative-free 1% lidocaine was injected into the anterior chamber.  The anterior chamber was filled with Viscoat viscoelastic.  A 2.4 millimeter keratome was used to make a near-clear corneal incision at the 10:30 position.  .  A curvilinear capsulorrhexis was made with a cystotome and capsulorrhexis forceps.  Balanced salt solution was used to hydrodissect and hydrodelineate the nucleus.   Phacoemulsification was then used in stop and chop fashion to remove the lens nucleus and epinucleus.  The remaining cortex was then removed using the irrigation and aspiration handpiece. Provisc was then placed into the capsular bag to distend it for lens placement.  A lens was then injected into the capsular bag.  The remaining viscoelastic was aspirated.   Wounds were hydrated with balanced salt  solution.  The anterior chamber was inflated to a physiologic pressure with balanced salt solution.  No wound leaks were noted. Cefuroxime 0.1 ml of a 10mg /ml solution was injected into the anterior chamber for a dose of 1 mg of intracameral antibiotic at the completion of the case.   Timolol and Brimonidine drops were applied to the eye.  The patient was taken to the recovery room in stable condition without complications of anesthesia or surgery.  [name] 04/11/2018, 9:14 AM

## 2018-04-11 NOTE — Anesthesia Preprocedure Evaluation (Signed)
Anesthesia Evaluation  Patient identified by MRN, date of birth, ID band Patient awake    Reviewed: Allergy & Precautions, H&P , NPO status , Patient's Chart, lab work & pertinent test results  Airway Mallampati: II  TM Distance: >3 FB Neck ROM: full    Dental no notable dental hx.    Pulmonary sleep apnea , former smoker,    Pulmonary exam normal breath sounds clear to auscultation       Cardiovascular hypertension, + Peripheral Vascular Disease  Normal cardiovascular exam Rhythm:regular Rate:Normal     Neuro/Psych    GI/Hepatic GERD  ,  Endo/Other    Renal/GU      Musculoskeletal   Abdominal   Peds  Hematology   Anesthesia Other Findings   Reproductive/Obstetrics                             Anesthesia Physical Anesthesia Plan  ASA: III  Anesthesia Plan: MAC   Post-op Pain Management:    Induction:   PONV Risk Score and Plan: 1 and Midazolam and Treatment may vary due to age or medical condition  Airway Management Planned:   Additional Equipment:   Intra-op Plan:   Post-operative Plan:   Informed Consent: I have reviewed the patients History and Physical, chart, labs and discussed the procedure including the risks, benefits and alternatives for the proposed anesthesia with the patient or authorized representative who has indicated his/her understanding and acceptance.       Plan Discussed with: CRNA  Anesthesia Plan Comments:         Anesthesia Quick Evaluation

## 2018-04-11 NOTE — H&P (Signed)

## 2018-04-11 NOTE — Anesthesia Postprocedure Evaluation (Signed)
Anesthesia Post Note  Patient: Travis Gray  Procedure(s) Performed: CATARACT EXTRACTION PHACO AND INTRAOCULAR LENS PLACEMENT (IOC)  COMPLICATED LEFT (Left Eye)  Patient location during evaluation: PACU Anesthesia Type: MAC Level of consciousness: awake and alert and oriented Pain management: satisfactory to patient Vital Signs Assessment: post-procedure vital signs reviewed and stable Respiratory status: spontaneous breathing, nonlabored ventilation and respiratory function stable Cardiovascular status: blood pressure returned to baseline and stable Postop Assessment: Adequate PO intake and No signs of nausea or vomiting Anesthetic complications: no    Raliegh Ip

## 2018-04-19 ENCOUNTER — Encounter: Payer: Self-pay | Admitting: Internal Medicine

## 2018-04-19 ENCOUNTER — Other Ambulatory Visit: Payer: 59

## 2018-04-23 ENCOUNTER — Other Ambulatory Visit: Payer: 59 | Admitting: Urology

## 2018-04-28 ENCOUNTER — Other Ambulatory Visit: Payer: Self-pay | Admitting: Internal Medicine

## 2018-05-08 ENCOUNTER — Encounter (INDEPENDENT_AMBULATORY_CARE_PROVIDER_SITE_OTHER): Payer: 59

## 2018-05-08 ENCOUNTER — Ambulatory Visit (INDEPENDENT_AMBULATORY_CARE_PROVIDER_SITE_OTHER): Payer: 59 | Admitting: Vascular Surgery

## 2018-05-18 ENCOUNTER — Encounter: Payer: Self-pay | Admitting: Internal Medicine

## 2018-05-18 ENCOUNTER — Other Ambulatory Visit: Payer: Self-pay

## 2018-05-18 ENCOUNTER — Ambulatory Visit (INDEPENDENT_AMBULATORY_CARE_PROVIDER_SITE_OTHER): Payer: 59 | Admitting: Internal Medicine

## 2018-05-18 DIAGNOSIS — K219 Gastro-esophageal reflux disease without esophagitis: Secondary | ICD-10-CM

## 2018-05-18 DIAGNOSIS — M5442 Lumbago with sciatica, left side: Secondary | ICD-10-CM

## 2018-05-18 DIAGNOSIS — I700 Atherosclerosis of aorta: Secondary | ICD-10-CM | POA: Diagnosis not present

## 2018-05-18 DIAGNOSIS — I739 Peripheral vascular disease, unspecified: Secondary | ICD-10-CM

## 2018-05-18 DIAGNOSIS — D649 Anemia, unspecified: Secondary | ICD-10-CM

## 2018-05-18 DIAGNOSIS — I10 Essential (primary) hypertension: Secondary | ICD-10-CM

## 2018-05-18 DIAGNOSIS — E7800 Pure hypercholesterolemia, unspecified: Secondary | ICD-10-CM

## 2018-05-18 NOTE — Progress Notes (Addendum)
Patient ID: Travis Gray, male   DOB: 05-11-1955, 63 y.o.   MRN: [id number] Virtual Visit via Video Note  This visit type was conducted due to national recommendations for restrictions regarding the COVID-19 pandemic (e.g. social distancing).  This format is felt to be most appropriate for this patient at this time.  All issues noted in this document were discussed and addressed.  No physical exam was performed (except for noted visual exam findings with Video Visits).   I connected with [name] on 05/18/18 at  9:30 AM EDT by a video enabled telemedicine application and verified that I am speaking with the correct person using two identifiers. Location patient: home Location provider: work Persons participating in the virtual visit: patient, provider  I discussed the limitations, risks, security and privacy concerns of performing an evaluation and management service by video and the availability of in person appointments. The patient expressed understanding and agreed to proceed.   Reason for visit: scheduled follow up.   HPI: He reports he is doing relatively well.  Still with back and hip pain.  Describes the pain as localized to lower back and into his hips.  Saw ortho.  Had hip injection.  Did not help.  Still with pain.  Discussed further w/up.  Discussed MRI.  He is agreeable.  On flomax.  Seeing urology for prostate issues.  Overall stable.  Plans f/u with urology.  Blood pressure is doing well.  No chest pain.  Is trying to stay active.  Is limited by his back.  Has to rest.  No sob.  Trying to stay in.  No known COVID exposure.  No chest congestion.  No fever.  No acid reflux.  No abdominal pain.  Bowels moving.     ROS: See pertinent positives and negatives per HPI.  Past Medical History:  Diagnosis Date  . Chronic headaches    migraines, 3-4x/yr  . Degenerative disc disease, cervical   . Genetic testing 03/20/2017   Multi-Cancer panel (83 genes) @ [facility] - No pathogenic  mutations detected  . GERD (gastroesophageal reflux disease)   . Hypercholesterolemia   . Hypertension   . Peripheral vascular disease ([place])    groin and right leg stents  . Sleep apnea    CPAP  . Tobacco abuse     Past Surgical History:  Procedure Laterality Date  . CATARACT EXTRACTION W/PHACO Left 04/11/2018   Procedure: CATARACT EXTRACTION PHACO AND INTRAOCULAR LENS PLACEMENT ([place])  COMPLICATED LEFT;  Surgeon: Leandrew Koyanagi, MD;  Location: [facility];  Service: Ophthalmology;  Laterality: Left;  OMIDRIA sleep apnea  . COLONOSCOPY WITH PROPOFOL N/A 11/25/2016   Procedure: COLONOSCOPY WITH PROPOFOL;  Surgeon: Toledo, [name], MD;  Location: [facility];  Service: Endoscopy;  Laterality: N/A;  . ESOPHAGOGASTRODUODENOSCOPY (EGD) WITH PROPOFOL N/A 11/25/2016   Procedure: ESOPHAGOGASTRODUODENOSCOPY (EGD) WITH PROPOFOL;  Surgeon: Toledo, [name], MD;  Location: [facility];  Service: Endoscopy;  Laterality: N/A;  . HERNIA REPAIR     Inguinal,laparoscopic surgery  . PERIPHERAL VASCULAR CATHETERIZATION Right 05/21/2015   Procedure: Lower Extremity Angiography;  Surgeon: Algernon Huxley, MD;  Location: [facility];  Service: Cardiovascular;  Laterality: Right;  . PERIPHERAL VASCULAR CATHETERIZATION  05/21/2015   Procedure: Lower Extremity Intervention;  Surgeon: Algernon Huxley, MD;  Location: [facility];  Service: Cardiovascular;;  . radio-ablated therapy     completed in the pain clinic    Family History  Problem Relation Age of Onset  .  Hypertension Mother   . Breast cancer Mother 69  . Uterine cancer Mother        unconfirmed; dx 51s; TAH/BSO; currently 95  . Hyperlipidemia Father   . Bladder Cancer Paternal Grandfather 73       smoker; deceased 69  . Diabetes Maternal Grandmother   . Non-Hodgkin's lymphoma Paternal Aunt        deceased 59    SOCIAL HX: reviewed.    Current Outpatient Medications:  .  amitriptyline (ELAVIL) 25 MG tablet,  Take 1 tablet (25 mg total) by mouth at bedtime., Disp: 30 tablet, Rfl: 5 .  atorvastatin (LIPITOR) 10 MG tablet, Take 1 tablet (10 mg total) by mouth daily., Disp: 30 tablet, Rfl: 5 .  clopidogrel (PLAVIX) 75 MG tablet, TAKE 1 TABLET BY MOUTH ONCE DAILY, Disp: 30 tablet, Rfl: 11 .  hydrochlorothiazide (HYDRODIURIL) 25 MG tablet, Take 1 tablet (25 mg total) by mouth daily., Disp: 90 tablet, Rfl: 3 .  losartan (COZAAR) 100 MG tablet, Take 1 tablet (100 mg total) by mouth daily., Disp: 90 tablet, Rfl: 3 .  metoprolol succinate (TOPROL-XL) 50 MG 24 hr tablet, TAKE 1 TABLET BY MOUTH ONCE DAILY, Disp: 30 tablet, Rfl: 2 .  omega-3 acid ethyl esters (LOVAZA) 1 g capsule, Take 1 capsule (1 g total) by mouth daily., Disp: 90 capsule, Rfl: 3 .  pantoprazole (PROTONIX) 40 MG tablet, Take 1 tablet (40 mg total) by mouth daily., Disp: 30 tablet, Rfl: 5 .  tamsulosin (FLOMAX) 0.4 MG CAPS capsule, Take 2 capsules (0.8 mg total) by mouth daily., Disp: 60 capsule, Rfl: 4 .  vitamin B-12 (CYANOCOBALAMIN) 1000 MCG tablet, Take 1,000 mcg by mouth daily., Disp: , Rfl:   EXAM:  VITALS per patient if applicable: blood pressure checked today 118/82 and pulse 66  GENERAL: alert, oriented, appears well and in no acute distress  HEENT: atraumatic, conjunttiva clear, no obvious abnormalities on inspection of external nose and ears  NECK: normal movements of the head and neck  LUNGS: on inspection no signs of respiratory distress, breathing rate appears normal, no obvious gross SOB, gasping or wheezing  CV: no obvious cyanosis  PSYCH/NEURO: pleasant and cooperative, no obvious depression or anxiety, speech and thought processing grossly intact  ASSESSMENT AND PLAN:  Discussed the following assessment and plan:  Anemia, unspecified type  Aortic atherosclerosis (HCC)  Left-sided low back pain with left-sided sciatica, unspecified chronicity - Plan: MR Lumbar Spine Wo Contrast  Essential hypertension,  benign  Gastroesophageal reflux disease, esophagitis presence not specified  Peripheral vascular disease (HCC)  Pure hypercholesterolemia  Anemia Follow cbc.   Aortic atherosclerosis (HCC) On lipitor.    Back pain Increased low back and hip pain as outlined.  Saw ortho.  S/p hip injection.  Did not help. Persistent pain.  Limits activity.  Will check MRI.    Essential hypertension, benign Blood pressure as outlined.  Continue current medication regimen.  Follow pressures.  Follow metabolic panel.   GERD (gastroesophageal reflux disease) Controlled on current regimen.  Follow.    Peripheral vascular disease ([place]) Seeing vascular surgery.  Is s/p bilateral iliac stents.  Just had ABIs - ok.  Stable.  Continue risk factor modification.   Pure hypercholesterolemia On lipitor.  Low cholesterol diet and exercise.  Follow lipid panel and liver function tests.      I discussed the assessment and treatment plan with the patient. The patient was provided an opportunity to ask questions and all were answered. The patient  agreed with the plan and demonstrated an understanding of the instructions.   The patient was advised to call back or seek an in-person evaluation if the symptoms worsen or if the condition fails to improve as anticipated.    [name], MD

## 2018-05-20 ENCOUNTER — Encounter: Payer: Self-pay | Admitting: Internal Medicine

## 2018-05-20 NOTE — Assessment & Plan Note (Signed)
Blood pressure as outlined.  Continue current medication regimen.  Follow pressures.  Follow metabolic panel.  

## 2018-05-20 NOTE — Assessment & Plan Note (Signed)
On lipitor

## 2018-05-20 NOTE — Assessment & Plan Note (Signed)
Seeing vascular surgery.  Is s/p bilateral iliac stents.  Just had ABIs - ok.  Stable.  Continue risk factor modification.

## 2018-05-20 NOTE — Assessment & Plan Note (Signed)
On lipitor.  Low cholesterol diet and exercise.  Follow lipid panel and liver function tests.   

## 2018-05-20 NOTE — Assessment & Plan Note (Signed)
Follow cbc.  

## 2018-05-20 NOTE — Assessment & Plan Note (Signed)
Increased low back and hip pain as outlined.  Saw ortho.  S/p hip injection.  Did not help. Persistent pain.  Limits activity.  Will check MRI.

## 2018-05-20 NOTE — Assessment & Plan Note (Signed)
Controlled on current regimen.  Follow.  

## 2018-05-24 ENCOUNTER — Other Ambulatory Visit (INDEPENDENT_AMBULATORY_CARE_PROVIDER_SITE_OTHER): Payer: Self-pay | Admitting: Vascular Surgery

## 2018-05-24 ENCOUNTER — Encounter: Payer: Self-pay | Admitting: Internal Medicine

## 2018-05-31 ENCOUNTER — Ambulatory Visit
Admission: RE | Admit: 2018-05-31 | Discharge: 2018-05-31 | Disposition: A | Discharge status: Home / Self Care | Payer: 59 | Source: Ambulatory Visit | Attending: Internal Medicine | Admitting: Internal Medicine

## 2018-05-31 ENCOUNTER — Other Ambulatory Visit: Payer: Self-pay

## 2018-05-31 DIAGNOSIS — M5442 Lumbago with sciatica, left side: Secondary | ICD-10-CM | POA: Diagnosis not present

## 2018-06-04 ENCOUNTER — Other Ambulatory Visit: Payer: Self-pay | Admitting: Internal Medicine

## 2018-06-04 ENCOUNTER — Other Ambulatory Visit: Payer: 59 | Admitting: Urology

## 2018-06-04 DIAGNOSIS — M5442 Lumbago with sciatica, left side: Secondary | ICD-10-CM

## 2018-06-04 NOTE — Progress Notes (Signed)
Order placed for neurosurgery referral 

## 2018-07-02 ENCOUNTER — Other Ambulatory Visit: Payer: 59 | Admitting: Urology

## 2018-07-24 ENCOUNTER — Other Ambulatory Visit (INDEPENDENT_AMBULATORY_CARE_PROVIDER_SITE_OTHER): Payer: 59

## 2018-07-24 ENCOUNTER — Other Ambulatory Visit: Payer: Self-pay

## 2018-07-24 DIAGNOSIS — E7800 Pure hypercholesterolemia, unspecified: Secondary | ICD-10-CM | POA: Diagnosis not present

## 2018-07-24 DIAGNOSIS — I10 Essential (primary) hypertension: Secondary | ICD-10-CM

## 2018-07-24 LAB — HEPATIC FUNCTION PANEL
ALT: 11 U/L (ref 0–53)
AST: 13 U/L (ref 0–37)
Albumin: 4.2 g/dL (ref 3.5–5.2)
Alkaline Phosphatase: 59 U/L (ref 39–117)
Bilirubin, Direct: 0.1 mg/dL (ref 0.0–0.3)
Total Bilirubin: 0.3 mg/dL (ref 0.2–1.2)
Total Protein: 6.2 g/dL (ref 6.0–8.3)

## 2018-07-24 LAB — LDL CHOLESTEROL, DIRECT: Direct LDL: 57 mg/dL

## 2018-07-24 LAB — BASIC METABOLIC PANEL
BUN: 14 mg/dL (ref 6–23)
CO2: 30 mEq/L (ref 19–32)
Calcium: 8.8 mg/dL (ref 8.4–10.5)
Chloride: 103 mEq/L (ref 96–112)
Creatinine, Ser: 1.15 mg/dL (ref 0.40–1.50)
GFR: 64.61 mL/min (ref >60.00)
Glucose, Bld: 102 mg/dL — ABNORMAL HIGH (ref 70–99)
Potassium: 4.2 mEq/L (ref 3.5–5.1)
Sodium: 140 mEq/L (ref 135–145)

## 2018-07-24 LAB — LIPID PANEL
Cholesterol: 174 mg/dL (ref 0–200)
HDL: 22.7 mg/dL — ABNORMAL LOW (ref >39.00)
Total CHOL/HDL Ratio: 8
Triglycerides: 654 mg/dL — ABNORMAL HIGH (ref 0.0–149.0)

## 2018-07-24 LAB — HEMOGLOBIN A1C: Hgb A1c MFr Bld: 5.7 % (ref 4.6–6.5)

## 2018-07-31 DIAGNOSIS — M5416 Radiculopathy, lumbar region: Secondary | ICD-10-CM | POA: Diagnosis not present

## 2018-07-31 DIAGNOSIS — M5126 Other intervertebral disc displacement, lumbar region: Secondary | ICD-10-CM | POA: Diagnosis not present

## 2018-08-06 ENCOUNTER — Other Ambulatory Visit: Payer: Self-pay | Admitting: Internal Medicine

## 2018-08-14 DIAGNOSIS — M25551 Pain in right hip: Secondary | ICD-10-CM | POA: Diagnosis not present

## 2018-08-14 DIAGNOSIS — M25552 Pain in left hip: Secondary | ICD-10-CM | POA: Diagnosis not present

## 2018-08-14 DIAGNOSIS — M160 Bilateral primary osteoarthritis of hip: Secondary | ICD-10-CM | POA: Diagnosis not present

## 2018-08-27 DIAGNOSIS — L02212 Cutaneous abscess of back [any part, except buttock]: Secondary | ICD-10-CM | POA: Diagnosis not present

## 2018-08-27 DIAGNOSIS — D485 Neoplasm of uncertain behavior of skin: Secondary | ICD-10-CM | POA: Diagnosis not present

## 2018-08-27 DIAGNOSIS — Z85840 Personal history of malignant neoplasm of eye: Secondary | ICD-10-CM | POA: Diagnosis not present

## 2018-08-27 DIAGNOSIS — C44519 Basal cell carcinoma of skin of other part of trunk: Secondary | ICD-10-CM | POA: Diagnosis not present

## 2018-08-27 DIAGNOSIS — C44612 Basal cell carcinoma of skin of right upper limb, including shoulder: Secondary | ICD-10-CM | POA: Diagnosis not present

## 2018-08-27 DIAGNOSIS — Z85828 Personal history of other malignant neoplasm of skin: Secondary | ICD-10-CM | POA: Diagnosis not present

## 2018-08-27 DIAGNOSIS — L570 Actinic keratosis: Secondary | ICD-10-CM | POA: Diagnosis not present

## 2018-09-05 DIAGNOSIS — M47816 Spondylosis without myelopathy or radiculopathy, lumbar region: Secondary | ICD-10-CM | POA: Diagnosis not present

## 2018-09-13 ENCOUNTER — Other Ambulatory Visit: Payer: Self-pay | Admitting: Internal Medicine

## 2018-09-18 ENCOUNTER — Telehealth: Payer: Self-pay | Admitting: Urology

## 2018-09-18 DIAGNOSIS — N411 Chronic prostatitis: Secondary | ICD-10-CM

## 2018-09-18 DIAGNOSIS — R350 Frequency of micturition: Secondary | ICD-10-CM

## 2018-09-18 DIAGNOSIS — R39198 Other difficulties with micturition: Secondary | ICD-10-CM

## 2018-09-18 MED ORDER — TAMSULOSIN HCL 0.4 MG PO CAPS: 0.8000 mg | ORAL_CAPSULE | Freq: Every day | ORAL | 6 refills | Status: DC

## 2018-09-18 NOTE — Telephone Encounter (Signed)
RX sent

## 2018-09-18 NOTE — Telephone Encounter (Signed)
Pt asking for a refill on his Tamsulosin to be sent to [facility] in [place]. Please advise.

## 2018-09-20 DIAGNOSIS — C44612 Basal cell carcinoma of skin of right upper limb, including shoulder: Secondary | ICD-10-CM | POA: Diagnosis not present

## 2018-09-20 DIAGNOSIS — C4491 Basal cell carcinoma of skin, unspecified: Secondary | ICD-10-CM | POA: Diagnosis not present

## 2018-09-27 DIAGNOSIS — M5126 Other intervertebral disc displacement, lumbar region: Secondary | ICD-10-CM | POA: Diagnosis not present

## 2018-09-27 DIAGNOSIS — M5416 Radiculopathy, lumbar region: Secondary | ICD-10-CM | POA: Diagnosis not present

## 2018-09-28 ENCOUNTER — Encounter: Payer: Self-pay | Admitting: Internal Medicine

## 2018-09-28 ENCOUNTER — Ambulatory Visit (INDEPENDENT_AMBULATORY_CARE_PROVIDER_SITE_OTHER): Payer: 59 | Admitting: Internal Medicine

## 2018-09-28 ENCOUNTER — Telehealth: Payer: Self-pay | Admitting: *Deleted

## 2018-09-28 ENCOUNTER — Other Ambulatory Visit: Payer: Self-pay

## 2018-09-28 VITALS — BP 118/70 | Ht 69.0 in | Wt 178.0 lb

## 2018-09-28 DIAGNOSIS — M5442 Lumbago with sciatica, left side: Secondary | ICD-10-CM

## 2018-09-28 DIAGNOSIS — Z125 Encounter for screening for malignant neoplasm of prostate: Secondary | ICD-10-CM

## 2018-09-28 DIAGNOSIS — K219 Gastro-esophageal reflux disease without esophagitis: Secondary | ICD-10-CM

## 2018-09-28 DIAGNOSIS — Z122 Encounter for screening for malignant neoplasm of respiratory organs: Secondary | ICD-10-CM

## 2018-09-28 DIAGNOSIS — E7800 Pure hypercholesterolemia, unspecified: Secondary | ICD-10-CM

## 2018-09-28 DIAGNOSIS — I700 Atherosclerosis of aorta: Secondary | ICD-10-CM

## 2018-09-28 DIAGNOSIS — I10 Essential (primary) hypertension: Secondary | ICD-10-CM

## 2018-09-28 DIAGNOSIS — R109 Unspecified abdominal pain: Secondary | ICD-10-CM

## 2018-09-28 DIAGNOSIS — D649 Anemia, unspecified: Secondary | ICD-10-CM

## 2018-09-28 DIAGNOSIS — Z87891 Personal history of nicotine dependence: Secondary | ICD-10-CM

## 2018-09-28 DIAGNOSIS — R739 Hyperglycemia, unspecified: Secondary | ICD-10-CM

## 2018-09-28 DIAGNOSIS — I739 Peripheral vascular disease, unspecified: Secondary | ICD-10-CM

## 2018-09-28 NOTE — Telephone Encounter (Signed)
Patient has been notified that lung cancer screening CT scan is due currently or will be in near future. Confirmed that patient is within age range and asymptomatic (denies signs or symptoms of lung cancer). Patient denies illness that would prevent curative treatment for lung cancer if found. Verified smoking history (Former smoker 1 ppd). Patient is agreeable for CT can being scheduled, prefers early morning appointments.

## 2018-09-28 NOTE — Progress Notes (Signed)
Patient ID: Travis Gray, male   DOB: October 04, 1955, 63 y.o.   MRN: [id number]   Virtual Visit via video Note  This visit type was conducted due to national recommendations for restrictions regarding the COVID-19 pandemic (e.g. social distancing).  This format is felt to be most appropriate for this patient at this time.  All issues noted in this document were discussed and addressed.  No physical exam was performed (except for noted visual exam findings with Video Visits).   I connected with [name] by a video enabled telemedicine application and verified that I am speaking with the correct person using two identifiers. Location patient: home Location provider: work  Persons participating in the virtual visit: patient, provider  I discussed the limitations, risks, security and privacy concerns of performing an evaluation and management service by video and the availability of in person appointments.  The patient expressed understanding and agreed to proceed.   Reason for visit: scheduled follow up.   HPI: He reports he is doing relatively well.  Has been having low back/hip and leg pain.  Diagnosed with L2 radiculopathy.  Has seen Dr Lacinda Axon and Dr Sharlet Salina.  On gabapentin.  S/p injection.  Had injection yesterday.  Injections are helping.  Seeing urology.  On flomax.  Planning for f/u 10/15/18 - cystoscopy.  Blood pressure doing well.  No acid reflux.  Some intermittent abdominal pain.  Persistent intermittent issue. May occur once ever few weeks to q month.  Some intermittent constipation.  Feels may be associated with constipation.  Discussed further w/up.  He wants to follow at this time.  Taking lipitor.  Discussed labs.  Seeing dermatology.  Basal cell removed.  Tries to stay active. Is able to walk better since injections.  No chest pain.  No sob.     ROS: See pertinent positives and negatives per HPI.  Past Medical History:  Diagnosis Date  . Chronic headaches    migraines, 3-4x/yr   . Degenerative disc disease, cervical   . Genetic testing 03/20/2017   Multi-Cancer panel (83 genes) @ [facility] - No pathogenic mutations detected  . GERD (gastroesophageal reflux disease)   . Hypercholesterolemia   . Hypertension   . Peripheral vascular disease ([place])    groin and right leg stents  . Sleep apnea    CPAP  . Tobacco abuse     Past Surgical History:  Procedure Laterality Date  . CATARACT EXTRACTION W/PHACO Left 04/11/2018   Procedure: CATARACT EXTRACTION PHACO AND INTRAOCULAR LENS PLACEMENT ([place])  COMPLICATED LEFT;  Surgeon: Leandrew Koyanagi, MD;  Location: [facility];  Service: Ophthalmology;  Laterality: Left;  OMIDRIA sleep apnea  . COLONOSCOPY WITH PROPOFOL N/A 11/25/2016   Procedure: COLONOSCOPY WITH PROPOFOL;  Surgeon: Toledo, [name], MD;  Location: [facility];  Service: Endoscopy;  Laterality: N/A;  . ESOPHAGOGASTRODUODENOSCOPY (EGD) WITH PROPOFOL N/A 11/25/2016   Procedure: ESOPHAGOGASTRODUODENOSCOPY (EGD) WITH PROPOFOL;  Surgeon: Toledo, [name], MD;  Location: [facility];  Service: Endoscopy;  Laterality: N/A;  . HERNIA REPAIR     Inguinal,laparoscopic surgery  . PERIPHERAL VASCULAR CATHETERIZATION Right 05/21/2015   Procedure: Lower Extremity Angiography;  Surgeon: Algernon Huxley, MD;  Location: [facility];  Service: Cardiovascular;  Laterality: Right;  . PERIPHERAL VASCULAR CATHETERIZATION  05/21/2015   Procedure: Lower Extremity Intervention;  Surgeon: Algernon Huxley, MD;  Location: [facility];  Service: Cardiovascular;;  . radio-ablated therapy     completed in the pain clinic  Family History  Problem Relation Age of Onset  . Hypertension Mother   . Breast cancer Mother 54  . Uterine cancer Mother        unconfirmed; dx 57s; TAH/BSO; currently 34  . Hyperlipidemia Father   . Bladder Cancer Paternal Grandfather 61       smoker; deceased 13  . Diabetes Maternal Grandmother   . Non-Hodgkin's lymphoma Paternal Aunt         deceased 48    SOCIAL HX: reviewed.    Current Outpatient Medications:  .  amitriptyline (ELAVIL) 25 MG tablet, TAKE 1 TABLET BY MOUTH AT BEDTIME, Disp: 30 tablet, Rfl: 5 .  atorvastatin (LIPITOR) 10 MG tablet, Take 1 tablet (10 mg total) by mouth daily., Disp: 30 tablet, Rfl: 5 .  clopidogrel (PLAVIX) 75 MG tablet, TAKE 1 TABLET BY MOUTH ONCE DAILY, Disp: 30 tablet, Rfl: 11 .  hydrochlorothiazide (HYDRODIURIL) 25 MG tablet, Take 1 tablet (25 mg total) by mouth daily., Disp: 90 tablet, Rfl: 3 .  losartan (COZAAR) 100 MG tablet, Take 1 tablet (100 mg total) by mouth daily., Disp: 90 tablet, Rfl: 3 .  metoprolol succinate (TOPROL-XL) 50 MG 24 hr tablet, TAKE 1 TABLET BY MOUTH ONCE DAILY, Disp: 30 tablet, Rfl: 2 .  omega-3 acid ethyl esters (LOVAZA) 1 g capsule, Take 1 capsule (1 g total) by mouth daily., Disp: 90 capsule, Rfl: 3 .  pantoprazole (PROTONIX) 40 MG tablet, Take 1 tablet (40 mg total) by mouth daily., Disp: 30 tablet, Rfl: 5 .  tamsulosin (FLOMAX) 0.4 MG CAPS capsule, Take 2 capsules (0.8 mg total) by mouth daily., Disp: 60 capsule, Rfl: 6 .  vitamin B-12 (CYANOCOBALAMIN) 1000 MCG tablet, Take 1,000 mcg by mouth daily., Disp: , Rfl:   EXAM:  VITALS per patient if applicable:  [id number]  GENERAL: alert, oriented, appears well and in no acute distress  HEENT: atraumatic, conjunttiva clear, no obvious abnormalities on inspection of external nose and ears  NECK: normal movements of the head and neck  LUNGS: on inspection no signs of respiratory distress, breathing rate appears normal, no obvious gross SOB, gasping or wheezing  CV: no obvious cyanosis  PSYCH/NEURO: pleasant and cooperative, no obvious depression or anxiety, speech and thought processing grossly intact  ASSESSMENT AND PLAN:  Discussed the following assessment and plan:  Abdominal pain Persistent intermittent abdominal pain as outlined.  Feels may be related to constipation.  Discussed with him today.   He wants to hold on any further testing or w/up.  Wants to follow.  Will notify me if he changes his mind or if symptoms change or worsen.    Anemia Follow cbc.   Aortic atherosclerosis (HCC) On lipitor.    Back pain Back and hip pain as outlined previously.  Had MRI.  Saw neurosurgery. Now seeing Dr Sharlet Salina.  S/p injections.  Helping.  Walking better.    Essential hypertension, benign Blood pressure under good control.  Continue same medication regimen.  Follow pressures.  Follow metabolic panel.    GERD (gastroesophageal reflux disease) Controlled.   Peripheral vascular disease ([place]) Followed by vascular surgery.  Is s/p iliac stents.  Continue risk factor modification.    Pure hypercholesterolemia On lipitor.  Low cholesterol diet and exercise.  Discussed diet and exercise. Follow lipid panel and liver function tests.      I discussed the assessment and treatment plan with the patient. The patient was provided an opportunity to ask questions and all were answered. The patient agreed  with the plan and demonstrated an understanding of the instructions.   The patient was advised to call back or seek an in-person evaluation if the symptoms worsen or if the condition fails to improve as anticipated.   [name], MD

## 2018-10-01 ENCOUNTER — Encounter: Payer: Self-pay | Admitting: Internal Medicine

## 2018-10-01 NOTE — Assessment & Plan Note (Signed)
Back and hip pain as outlined previously.  Had MRI.  Saw neurosurgery. Now seeing Dr Sharlet Salina.  S/p injections.  Helping.  Walking better.

## 2018-10-01 NOTE — Assessment & Plan Note (Signed)
Followed by vascular surgery.  Is s/p iliac stents.  Continue risk factor modification.

## 2018-10-01 NOTE — Assessment & Plan Note (Signed)
On lipitor

## 2018-10-01 NOTE — Assessment & Plan Note (Signed)
Blood pressure under good control.  Continue same medication regimen.  Follow pressures.  Follow metabolic panel.   

## 2018-10-01 NOTE — Assessment & Plan Note (Signed)
On lipitor.  Low cholesterol diet and exercise.  Discussed diet and exercise. Follow lipid panel and liver function tests.

## 2018-10-01 NOTE — Assessment & Plan Note (Signed)
Persistent intermittent abdominal pain as outlined.  Feels may be related to constipation.  Discussed with him today.  He wants to hold on any further testing or w/up.  Wants to follow.  Will notify me if he changes his mind or if symptoms change or worsen.

## 2018-10-01 NOTE — Assessment & Plan Note (Signed)
Controlled.  

## 2018-10-01 NOTE — Assessment & Plan Note (Signed)
Follow cbc.  

## 2018-10-02 ENCOUNTER — Telehealth: Payer: Self-pay | Admitting: Internal Medicine

## 2018-10-02 NOTE — Telephone Encounter (Signed)
Former smoker, quit 05/2015, 37 pack year

## 2018-10-02 NOTE — Telephone Encounter (Signed)
-----   Message from Lieutenant Diego, RN sent at 10/02/2018 12:19 PM EDT ----- Regarding: RE: ct scan You too. He is scheduled next Wednesday am per his preference.  ----- Message ----- From: [name], MD Sent: 10/02/2018   9:01 AM EDT To: Lieutenant Diego, RN Subject: RE: ct scan                                    Thank you.  Have a good week.   Stay well.  [name] ----- Message ----- From: Lieutenant Diego, RN Sent: 10/02/2018   8:53 AM EDT To: [name], MD Subject: RE: ct scan                                    Hey Dr. Nicki Reaper, He is due just now and has either been contacted in the last few days or is on a list to be contacted asap. You don't need to do anything and I anticipate his scan being later this week or the first of next.  Thank you, Shawn ----- Message ----- From: [name], MD Sent: 10/01/2018  10:54 PM EDT To: Lieutenant Diego, RN Subject: ct scan                                        Mr Kopplin is not sure when to schedule f/u CT.  Can you help with this and let me know if I need to do anything.  Thank you for your help.   [name]

## 2018-10-02 NOTE — Addendum Note (Signed)
Addended by: Lieutenant Diego on: 10/02/2018 12:18 PM   Modules accepted: Orders

## 2018-10-04 DIAGNOSIS — L0100 Impetigo, unspecified: Secondary | ICD-10-CM | POA: Diagnosis not present

## 2018-10-10 ENCOUNTER — Ambulatory Visit
Admission: RE | Admit: 2018-10-10 | Discharge: 2018-10-10 | Disposition: A | Discharge status: Home / Self Care | Payer: BC Managed Care – PPO | Source: Ambulatory Visit | Attending: Nurse Practitioner | Admitting: Nurse Practitioner

## 2018-10-10 ENCOUNTER — Other Ambulatory Visit: Payer: Self-pay

## 2018-10-10 DIAGNOSIS — Z87891 Personal history of nicotine dependence: Secondary | ICD-10-CM | POA: Insufficient documentation

## 2018-10-10 DIAGNOSIS — Z122 Encounter for screening for malignant neoplasm of respiratory organs: Secondary | ICD-10-CM | POA: Diagnosis not present

## 2018-10-11 DIAGNOSIS — C4491 Basal cell carcinoma of skin, unspecified: Secondary | ICD-10-CM | POA: Diagnosis not present

## 2018-10-11 DIAGNOSIS — C44519 Basal cell carcinoma of skin of other part of trunk: Secondary | ICD-10-CM | POA: Diagnosis not present

## 2018-10-12 ENCOUNTER — Encounter: Payer: Self-pay | Admitting: *Deleted

## 2018-10-15 ENCOUNTER — Other Ambulatory Visit: Payer: Self-pay | Admitting: Internal Medicine

## 2018-10-15 ENCOUNTER — Other Ambulatory Visit: Payer: 59 | Admitting: Urology

## 2018-10-29 ENCOUNTER — Other Ambulatory Visit: Payer: Self-pay

## 2018-10-29 ENCOUNTER — Encounter: Payer: Self-pay | Admitting: Urology

## 2018-10-29 ENCOUNTER — Ambulatory Visit: Payer: BC Managed Care – PPO | Admitting: Urology

## 2018-10-29 VITALS — BP 151/93 | HR 75 | Ht 69.0 in | Wt 180.0 lb

## 2018-10-29 DIAGNOSIS — R350 Frequency of micturition: Secondary | ICD-10-CM

## 2018-10-29 DIAGNOSIS — R339 Retention of urine, unspecified: Secondary | ICD-10-CM

## 2018-10-29 DIAGNOSIS — R39198 Other difficulties with micturition: Secondary | ICD-10-CM

## 2018-10-29 DIAGNOSIS — N411 Chronic prostatitis: Secondary | ICD-10-CM

## 2018-10-29 MED ORDER — TAMSULOSIN HCL 0.4 MG PO CAPS: 0.8000 mg | ORAL_CAPSULE | Freq: Every day | ORAL | 11 refills | Status: DC

## 2018-10-29 NOTE — Progress Notes (Signed)
10/29/2018 10:13 AM   Travis Gray 1955/05/26 [id number]  Referring provider: [name], MD [id number] [place],  [place] [id number]  Chief Complaint  Patient presents with  . Cysto    HPI: I was consulted to assess the patient's discomfort with urination and frequency. He actually has pain in the penis when he voids but otherwise not. He is voiding every 30 to 60 minutes and has difficulty holding it for 2 hours. Since starting CPAP he rarely gets up once a night.He has no pain with ejaculation  His flow was poor. Sometimes he hesitates sometimes he stops and starts. Sometimes he strains.He does feel empty but 10 minutes later can double void a varying amount  Patient has flow symptoms and pain with urination. He has increased frequency. His residual today was 262 mL. Urine sent for culture. I will first treat him for prostatitis. Pathophysiology and treatment discussed. I called in Mobic and ciprofloxacin. It turns out he started Flomax 3 weeks ago and his flow is a bit better and he says he has less pain and I will keep him on this. Ciprofloxacin 500 mg and Mobic 15 mg sent. He may need cystoscopy in the future and/or urodynamics  Still has discomfort in the penis when he urinates. Residual today 192 mL  I double his max of 0.8 mg.  Come back for cystoscopy.  Get ultrasound for silent hydronephrosis.  He understands it in the future he likely will need urodynamics.  I want to rule out a stricture or any bladder issues causing discomfort prior  Today Frequency stable.  Ultrasound showed thickening of bladder wall and normal kidneys.  I distant culture was negative  Patient reported that the Flomax 0.8 mg does help the flow but he still feels discomfort in the penis but it is less.  Residual was also high on ultrasound.  No silent hydronephrosis on ultrasound  Cystoscopy: Patient underwent flexible cystoscopy.  The penile bulbar  urethra normal.  He had mild to moderate bilobar enlargement of the prostate and a normal length prostatic urethra.  He a grade 1 out of 4 bladder trabeculation.  Trigone was a little bit difficult to see due to high riding bladder neck that was minimal.  No cystitis or carcinoma.  Tolerated procedure well    PMH: Past Medical History:  Diagnosis Date  . Chronic headaches    migraines, 3-4x/yr  . Degenerative disc disease, cervical   . Genetic testing 03/20/2017   Multi-Cancer panel (83 genes) @ [facility] - No pathogenic mutations detected  . GERD (gastroesophageal reflux disease)   . Hypercholesterolemia   . Hypertension   . Peripheral vascular disease ([place])    groin and right leg stents  . Sleep apnea    CPAP  . Tobacco abuse     Surgical History: Past Surgical History:  Procedure Laterality Date  . CATARACT EXTRACTION W/PHACO Left 04/11/2018   Procedure: CATARACT EXTRACTION PHACO AND INTRAOCULAR LENS PLACEMENT ([place])  COMPLICATED LEFT;  Surgeon: Leandrew Koyanagi, MD;  Location: [place];  Service: Ophthalmology;  Laterality: Left;  OMIDRIA sleep apnea  . COLONOSCOPY WITH PROPOFOL N/A 11/25/2016   Procedure: COLONOSCOPY WITH PROPOFOL;  Surgeon: Toledo, [name], MD;  Location: [facility];  Service: Endoscopy;  Laterality: N/A;  . ESOPHAGOGASTRODUODENOSCOPY (EGD) WITH PROPOFOL N/A 11/25/2016   Procedure: ESOPHAGOGASTRODUODENOSCOPY (EGD) WITH PROPOFOL;  Surgeon: Toledo, [name], MD;  Location: [facility];  Service: Endoscopy;  Laterality: N/A;  . HERNIA REPAIR  Inguinal,laparoscopic surgery  . PERIPHERAL VASCULAR CATHETERIZATION Right 05/21/2015   Procedure: Lower Extremity Angiography;  Surgeon: Algernon Huxley, MD;  Location: [place] [facility];  Service: Cardiovascular;  Laterality: Right;  . PERIPHERAL VASCULAR CATHETERIZATION  05/21/2015   Procedure: Lower Extremity Intervention;  Surgeon: Algernon Huxley, MD;  Location: [place] [facility];  Service:  Cardiovascular;;  . radio-ablated therapy     completed in the pain clinic    Home Medications:  Allergies as of 10/29/2018   No Known Allergies     Medication List       Accurate as of October 29, 2018 10:13 AM. If you have any questions, ask your nurse or doctor.        amitriptyline 25 MG tablet Commonly known as: ELAVIL TAKE 1 TABLET BY MOUTH AT BEDTIME   atorvastatin 10 MG tablet Commonly known as: LIPITOR Take 1 tablet (10 mg total) by mouth daily.   clopidogrel 75 MG tablet Commonly known as: PLAVIX TAKE 1 TABLET BY MOUTH ONCE DAILY   hydrochlorothiazide 25 MG tablet Commonly known as: HYDRODIURIL Take 1 tablet (25 mg total) by mouth daily.   losartan 100 MG tablet Commonly known as: COZAAR Take 1 tablet (100 mg total) by mouth daily.   metoprolol succinate 50 MG 24 hr tablet Commonly known as: TOPROL-XL TAKE 1 TABLET BY MOUTH ONCE DAILY   omega-3 acid ethyl esters 1 g capsule Commonly known as: LOVAZA Take 1 capsule (1 g total) by mouth daily.   pantoprazole 40 MG tablet Commonly known as: PROTONIX TAKE 1 TABLET BY MOUTH ONCE DAILY   tamsulosin 0.4 MG Caps capsule Commonly known as: FLOMAX Take 2 capsules (0.8 mg total) by mouth daily.   vitamin B-12 1000 MCG tablet Commonly known as: CYANOCOBALAMIN Take 1,000 mcg by mouth daily.       Allergies: No Known Allergies  Family History: Family History  Problem Relation Age of Onset  . Hypertension Mother   . Breast cancer Mother 90  . Uterine cancer Mother        unconfirmed; dx 34s; TAH/BSO; currently 60  . Hyperlipidemia Father   . Bladder Cancer Paternal Grandfather 77       smoker; deceased 21  . Diabetes Maternal Grandmother   . Non-Hodgkin's lymphoma Paternal Aunt        deceased 9    Social History:  reports that he quit smoking about 3 years ago. His smoking use included cigarettes. He has a 37.00 pack-year smoking history. He has never used smokeless tobacco. He reports current  alcohol use. He reports that he does not use drugs.  ROS:                                        Physical Exam: BP (!) 151/93   Pulse 75   Ht 5\' 9"  (1.753 m)   Wt 81.6 kg   BMI 26.58 kg/m   Constitutional:  Alert and oriented, No acute distress.  Laboratory Data: Lab Results  Component Value Date   WBC 6.9 11/07/2017   HGB 14.4 11/07/2017   HCT 41.8 11/07/2017   MCV 93.2 11/07/2017   PLT 226.0 11/07/2017    Lab Results  Component Value Date   CREATININE 1.15 07/24/2018    Lab Results  Component Value Date   PSA 1.74 11/07/2017   PSA 2.03 08/22/2016   PSA 1.08 12/11/2014  No results found for: TESTOSTERONE  Lab Results  Component Value Date   HGBA1C 5.7 07/24/2018    Urinalysis    Component Value Date/Time   COLORURINE YELLOW 08/22/2016 1600   APPEARANCEUR Clear 03/19/2018 1334   LABSPEC 1.015 08/22/2016 1600   LABSPEC 1.025 05/21/2012 1456   PHURINE 6.0 08/22/2016 1600   GLUCOSEU Negative 03/19/2018 1334   GLUCOSEU NEGATIVE 08/22/2016 1600   HGBUR NEGATIVE 08/22/2016 1600   BILIRUBINUR Negative 03/19/2018 1334   BILIRUBINUR Negative 05/21/2012 1456   KETONESUR NEGATIVE 08/22/2016 1600   PROTEINUR Negative 03/19/2018 1334   PROTEINUR Negative 05/21/2012 1456   UROBILINOGEN 0.2 01/09/2018 1049   UROBILINOGEN 0.2 08/22/2016 1600   NITRITE Negative 03/19/2018 1334   NITRITE NEGATIVE 08/22/2016 1600   LEUKOCYTESUR Negative 03/19/2018 1334   LEUKOCYTESUR Negative 05/21/2012 1456    Pertinent Imaging:   Assessment & Plan: Patient has obstructive voiding symptoms and discomfort with voiding.  He understands the concept of prostatitis.  He does have elevated residuals.  Certainly surgery may not reach his treatment goal in the sense of discomfort and he could up regulate it. The role of UDS described in the more complex presentation.  Having said that there is objective elevated residuals that certainly could justify a prostate  directed therapy  Patient understands very well that surgery may not reach his goal but he is actually doing quite acceptable on the Flomax 0.8 mg.  We will continue with conservative treatment.  Assess with a residual in 6 months  1. Prostatitis, chronic  - Urinalysis, Complete   Return in about 6 months (around 04/29/2019) for MD follow up with [facility].  Reece Packer, MD  [place] [address] [place], [place] [address] [phone]

## 2018-10-30 LAB — MICROSCOPIC EXAMINATION
Bacteria, UA: NONE SEEN
RBC, Urine: NONE SEEN /hpf (ref 0–2)

## 2018-10-30 LAB — URINALYSIS, COMPLETE
Bilirubin, UA: NEGATIVE
Glucose, UA: NEGATIVE
Ketones, UA: NEGATIVE
Leukocytes,UA: NEGATIVE
Nitrite, UA: NEGATIVE
Protein,UA: NEGATIVE
RBC, UA: NEGATIVE
Specific Gravity, UA: 1.025 (ref 1.005–1.030)
Urobilinogen, Ur: 0.2 mg/dL (ref 0.2–1.0)
pH, UA: 6 (ref 5.0–7.5)

## 2018-10-31 ENCOUNTER — Other Ambulatory Visit: Payer: Self-pay | Admitting: Internal Medicine

## 2018-11-08 ENCOUNTER — Other Ambulatory Visit: Payer: Self-pay

## 2018-11-16 ENCOUNTER — Other Ambulatory Visit (INDEPENDENT_AMBULATORY_CARE_PROVIDER_SITE_OTHER): Payer: BC Managed Care – PPO

## 2018-11-16 ENCOUNTER — Other Ambulatory Visit: Payer: Self-pay

## 2018-11-16 DIAGNOSIS — R739 Hyperglycemia, unspecified: Secondary | ICD-10-CM | POA: Diagnosis not present

## 2018-11-16 DIAGNOSIS — D649 Anemia, unspecified: Secondary | ICD-10-CM | POA: Diagnosis not present

## 2018-11-16 DIAGNOSIS — E7800 Pure hypercholesterolemia, unspecified: Secondary | ICD-10-CM

## 2018-11-16 DIAGNOSIS — I10 Essential (primary) hypertension: Secondary | ICD-10-CM | POA: Diagnosis not present

## 2018-11-16 DIAGNOSIS — Z125 Encounter for screening for malignant neoplasm of prostate: Secondary | ICD-10-CM

## 2018-11-16 LAB — HEPATIC FUNCTION PANEL
ALT: 17 U/L (ref 0–53)
AST: 18 U/L (ref 0–37)
Albumin: 4.5 g/dL (ref 3.5–5.2)
Alkaline Phosphatase: 57 U/L (ref 39–117)
Bilirubin, Direct: 0.1 mg/dL (ref 0.0–0.3)
Total Bilirubin: 0.7 mg/dL (ref 0.2–1.2)
Total Protein: 6.7 g/dL (ref 6.0–8.3)

## 2018-11-16 LAB — CBC WITH DIFFERENTIAL/PLATELET
Basophils Absolute: 0 10*3/uL (ref 0.0–0.1)
Basophils Relative: 0.8 % (ref 0.0–3.0)
Eosinophils Absolute: 0.2 10*3/uL (ref 0.0–0.7)
Eosinophils Relative: 3.6 % (ref 0.0–5.0)
HCT: 42.1 % (ref 39.0–52.0)
Hemoglobin: 14.4 g/dL (ref 13.0–17.0)
Lymphocytes Relative: 45.2 % (ref 12.0–46.0)
Lymphs Abs: 2.4 10*3/uL (ref 0.7–4.0)
MCHC: 34.2 g/dL (ref 30.0–36.0)
MCV: 93.9 fl (ref 78.0–100.0)
Monocytes Absolute: 0.5 10*3/uL (ref 0.1–1.0)
Monocytes Relative: 8.8 % (ref 3.0–12.0)
Neutro Abs: 2.2 10*3/uL (ref 1.4–7.7)
Neutrophils Relative %: 41.6 % — ABNORMAL LOW (ref 43.0–77.0)
Platelets: 200 10*3/uL (ref 150.0–400.0)
RBC: 4.48 Mil/uL (ref 4.22–5.81)
RDW: 13.3 % (ref 11.5–15.5)
WBC: 5.4 10*3/uL (ref 4.0–10.5)

## 2018-11-16 LAB — BASIC METABOLIC PANEL
BUN: 19 mg/dL (ref 6–23)
CO2: 30 mEq/L (ref 19–32)
Calcium: 9.4 mg/dL (ref 8.4–10.5)
Chloride: 102 mEq/L (ref 96–112)
Creatinine, Ser: 1.1 mg/dL (ref 0.40–1.50)
GFR: 67.94 mL/min (ref >60.00)
Glucose, Bld: 108 mg/dL — ABNORMAL HIGH (ref 70–99)
Potassium: 4.4 mEq/L (ref 3.5–5.1)
Sodium: 140 mEq/L (ref 135–145)

## 2018-11-16 LAB — LIPID PANEL
Cholesterol: 193 mg/dL (ref 0–200)
HDL: 30.6 mg/dL — ABNORMAL LOW (ref >39.00)
NonHDL: 162.2
Total CHOL/HDL Ratio: 6
Triglycerides: 334 mg/dL — ABNORMAL HIGH (ref 0.0–149.0)
VLDL: 66.8 mg/dL — ABNORMAL HIGH (ref 0.0–40.0)

## 2018-11-16 LAB — HEMOGLOBIN A1C: Hgb A1c MFr Bld: 5.4 % (ref 4.6–6.5)

## 2018-11-16 LAB — LDL CHOLESTEROL, DIRECT: Direct LDL: 79 mg/dL

## 2018-11-16 LAB — PSA: PSA: 1.51 ng/mL (ref 0.10–4.00)

## 2018-11-17 ENCOUNTER — Encounter: Payer: Self-pay | Admitting: Internal Medicine

## 2018-11-27 DIAGNOSIS — H43812 Vitreous degeneration, left eye: Secondary | ICD-10-CM | POA: Diagnosis not present

## 2018-12-18 ENCOUNTER — Other Ambulatory Visit: Payer: Self-pay | Admitting: Internal Medicine

## 2018-12-25 ENCOUNTER — Other Ambulatory Visit: Payer: Self-pay

## 2018-12-25 DIAGNOSIS — I10 Essential (primary) hypertension: Secondary | ICD-10-CM

## 2018-12-25 MED ORDER — LOSARTAN POTASSIUM 100 MG PO TABS: 100.0000 mg | ORAL_TABLET | Freq: Every day | ORAL | 3 refills | Status: DC

## 2019-01-14 ENCOUNTER — Ambulatory Visit (INDEPENDENT_AMBULATORY_CARE_PROVIDER_SITE_OTHER): Payer: BC Managed Care – PPO | Admitting: Internal Medicine

## 2019-01-14 ENCOUNTER — Encounter: Payer: Self-pay | Admitting: Internal Medicine

## 2019-01-14 ENCOUNTER — Other Ambulatory Visit: Payer: Self-pay

## 2019-01-14 VITALS — BP 120/80 | Ht 69.0 in | Wt 176.0 lb

## 2019-01-14 DIAGNOSIS — K219 Gastro-esophageal reflux disease without esophagitis: Secondary | ICD-10-CM

## 2019-01-14 DIAGNOSIS — I700 Atherosclerosis of aorta: Secondary | ICD-10-CM | POA: Diagnosis not present

## 2019-01-14 DIAGNOSIS — E7800 Pure hypercholesterolemia, unspecified: Secondary | ICD-10-CM

## 2019-01-14 DIAGNOSIS — M5442 Lumbago with sciatica, left side: Secondary | ICD-10-CM

## 2019-01-14 DIAGNOSIS — I739 Peripheral vascular disease, unspecified: Secondary | ICD-10-CM

## 2019-01-14 DIAGNOSIS — N411 Chronic prostatitis: Secondary | ICD-10-CM

## 2019-01-14 DIAGNOSIS — R739 Hyperglycemia, unspecified: Secondary | ICD-10-CM | POA: Diagnosis not present

## 2019-01-14 DIAGNOSIS — R109 Unspecified abdominal pain: Secondary | ICD-10-CM | POA: Diagnosis not present

## 2019-01-14 DIAGNOSIS — I10 Essential (primary) hypertension: Secondary | ICD-10-CM

## 2019-01-14 DIAGNOSIS — D649 Anemia, unspecified: Secondary | ICD-10-CM

## 2019-01-14 NOTE — Progress Notes (Signed)
Patient ID: Travis Gray, male   DOB: 1955-07-06, 63 y.o.   MRN: [id number]   Virtual Visit via video Note  This visit type was conducted due to national recommendations for restrictions regarding the COVID-19 pandemic (e.g. social distancing).  This format is felt to be most appropriate for this patient at this time.  All issues noted in this document were discussed and addressed.  No physical exam was performed (except for noted visual exam findings with Video Visits).   I connected with Travis Gray by a video enabled telemedicine application or telephone and verified that I am speaking with the correct person using two identifiers. Location patient: home Location provider: work  Persons participating in the virtual visit: patient, provider  I discussed the limitations, risks, security and privacy concerns of performing an evaluation and management service by video and the availability of in person appointments.  The patient expressed understanding and agreed to proceed.   Reason for visit: scheduled follow up.   HPI: He reports he is doing relatively well.  Staying busy.  Still having issues with his back/hip/leg.  Previous injections helped.  Having some issues with insurance.  Planning to f/u with repeat injections once this is sorted through.  Also states as long as he takes his flomax, urination is better.  Still having intermittent discomfort in his right lower side/quadrant.  No known trigger. Varied times it occurs.  May not occur for a few weeks.  Eating.  No nausea or vomiting.  No constipation or diarrhea.  Sometimes notices when he eats.  No specific foods.  Was questioning possibly a msk etiology.  No association with his back.  Blood pressure averaging 120/80.  Discussed recent labs.  Triglycerides improved.     ROS: See pertinent positives and negatives per HPI.  Past Medical History:  Diagnosis Date  . Chronic headaches    migraines, 3-4x/yr  . Degenerative disc disease,  cervical   . Genetic testing 03/20/2017   Multi-Cancer panel (83 genes) @ [facility] - No pathogenic mutations detected  . GERD (gastroesophageal reflux disease)   . Hypercholesterolemia   . Hypertension   . Peripheral vascular disease ([place])    groin and right leg stents  . Sleep apnea    CPAP  . Tobacco abuse     Past Surgical History:  Procedure Laterality Date  . CATARACT EXTRACTION W/PHACO Left 04/11/2018   Procedure: CATARACT EXTRACTION PHACO AND INTRAOCULAR LENS PLACEMENT ([place])  COMPLICATED LEFT;  Surgeon: Leandrew Koyanagi, MD;  Location: [place];  Service: Ophthalmology;  Laterality: Left;  OMIDRIA sleep apnea  . COLONOSCOPY WITH PROPOFOL N/A 11/25/2016   Procedure: COLONOSCOPY WITH PROPOFOL;  Surgeon: Toledo, [name], MD;  Location: [facility];  Service: Endoscopy;  Laterality: N/A;  . ESOPHAGOGASTRODUODENOSCOPY (EGD) WITH PROPOFOL N/A 11/25/2016   Procedure: ESOPHAGOGASTRODUODENOSCOPY (EGD) WITH PROPOFOL;  Surgeon: Toledo, [name], MD;  Location: [facility];  Service: Endoscopy;  Laterality: N/A;  . HERNIA REPAIR     Inguinal,laparoscopic surgery  . PERIPHERAL VASCULAR CATHETERIZATION Right 05/21/2015   Procedure: Lower Extremity Angiography;  Surgeon: Algernon Huxley, MD;  Location: [place] [facility];  Service: Cardiovascular;  Laterality: Right;  . PERIPHERAL VASCULAR CATHETERIZATION  05/21/2015   Procedure: Lower Extremity Intervention;  Surgeon: Algernon Huxley, MD;  Location: [place] [facility];  Service: Cardiovascular;;  . radio-ablated therapy     completed in the pain clinic    Family History  Problem Relation Age of Onset  . Hypertension  Mother   . Breast cancer Mother 21  . Uterine cancer Mother        unconfirmed; dx 68s; TAH/BSO; currently 66  . Hyperlipidemia Father   . Bladder Cancer Paternal Grandfather 48       smoker; deceased 80  . Diabetes Maternal Grandmother   . Non-Hodgkin's lymphoma Paternal Aunt        deceased 55     SOCIAL HX: reviewed.    Current Outpatient Medications:  .  amitriptyline (ELAVIL) 25 MG tablet, TAKE 1 TABLET BY MOUTH AT BEDTIME, Disp: 30 tablet, Rfl: 5 .  atorvastatin (LIPITOR) 10 MG tablet, TAKE 1 TABLET BY MOUTH AT BEDTIME, Disp: 30 tablet, Rfl: 5 .  clopidogrel (PLAVIX) 75 MG tablet, TAKE 1 TABLET BY MOUTH ONCE DAILY, Disp: 30 tablet, Rfl: 11 .  hydrochlorothiazide (HYDRODIURIL) 25 MG tablet, Take 1 tablet (25 mg total) by mouth daily., Disp: 90 tablet, Rfl: 3 .  losartan (COZAAR) 100 MG tablet, Take 1 tablet (100 mg total) by mouth daily., Disp: 90 tablet, Rfl: 3 .  metoprolol succinate (TOPROL-XL) 50 MG 24 hr tablet, TAKE 1 TABLET BY MOUTH ONCE DAILY, Disp: 30 tablet, Rfl: 2 .  omega-3 acid ethyl esters (LOVAZA) 1 g capsule, Take 1 capsule (1 g total) by mouth daily., Disp: 90 capsule, Rfl: 3 .  pantoprazole (PROTONIX) 40 MG tablet, TAKE 1 TABLET BY MOUTH ONCE DAILY, Disp: 30 tablet, Rfl: 5 .  tamsulosin (FLOMAX) 0.4 MG CAPS capsule, Take 2 capsules (0.8 mg total) by mouth daily., Disp: 60 capsule, Rfl: 11 .  vitamin B-12 (CYANOCOBALAMIN) 1000 MCG tablet, Take 1,000 mcg by mouth daily., Disp: , Rfl:   EXAM:  GENERAL: alert, oriented, appears well and in no acute distress  HEENT: atraumatic, conjunttiva clear, no obvious abnormalities on inspection of external nose and ears  NECK: normal movements of the head and neck  LUNGS: on inspection no signs of respiratory distress, breathing rate appears normal, no obvious gross SOB, gasping or wheezing  CV: no obvious cyanosis  PSYCH/NEURO: pleasant and cooperative, no obvious depression or anxiety, speech and thought processing grossly intact  ASSESSMENT AND PLAN:  Discussed the following assessment and plan:  Abdominal pain Persistent intermittent right lower side/quadrant pain. Previous CT unrevealing.  Unclear etiology.  No definite triggers.  Question of msk etiology.  discused further evaluation.  Elected to follow for  now.      Anemia Follow cbc.   Aortic atherosclerosis (HCC) On lipitor.   Back pain Back and hip pain.  S/p MRI.  Saw neurosurgery.  Seeing Dr Sharlet Salina.  S/p injections.  Previous injections helped.  Once insurance issues are sorted through - plan for further injections.    Essential hypertension, benign Blood pressure under good control.  Continue same medication regimen.  Follow pressures.  Follow metabolic panel.    GERD (gastroesophageal reflux disease) Controlled.    Peripheral vascular disease ([place]) S/p iliac stents.  Followed by vascular surgery.  Continue risk factor modification.   Pure hypercholesterolemia On lipitor.  Low cholesterol diet and exercise.  Follow lipid panel and liver function tests.    Chronic prostatitis Followed by urology.  On flomax.     I discussed the assessment and treatment plan with the patient. The patient was provided an opportunity to ask questions and all were answered. The patient agreed with the plan and demonstrated an understanding of the instructions.   The patient was advised to call back or seek an in-person evaluation if the  symptoms worsen or if the condition fails to improve as anticipated.   [name], MD

## 2019-01-19 ENCOUNTER — Encounter: Payer: Self-pay | Admitting: Internal Medicine

## 2019-01-19 DIAGNOSIS — N411 Chronic prostatitis: Secondary | ICD-10-CM | POA: Insufficient documentation

## 2019-01-19 NOTE — Assessment & Plan Note (Signed)
Follow cbc.  

## 2019-01-19 NOTE — Assessment & Plan Note (Signed)
Persistent intermittent right lower side/quadrant pain. Previous CT unrevealing.  Unclear etiology.  No definite triggers.  Question of msk etiology.  discused further evaluation.  Elected to follow for now.

## 2019-01-19 NOTE — Assessment & Plan Note (Signed)
On lipitor.  Low cholesterol diet and exercise.  Follow lipid panel and liver function tests.   

## 2019-01-19 NOTE — Assessment & Plan Note (Signed)
Followed by urology.  On flomax.

## 2019-01-19 NOTE — Assessment & Plan Note (Signed)
S/p iliac stents.  Followed by vascular surgery.  Continue risk factor modification.

## 2019-01-19 NOTE — Assessment & Plan Note (Signed)
Blood pressure under good control.  Continue same medication regimen.  Follow pressures.  Follow metabolic panel.   

## 2019-01-19 NOTE — Assessment & Plan Note (Signed)
Back and hip pain.  S/p MRI.  Saw neurosurgery.  Seeing Dr Sharlet Salina.  S/p injections.  Previous injections helped.  Once insurance issues are sorted through - plan for further injections.

## 2019-01-19 NOTE — Assessment & Plan Note (Signed)
Controlled.  

## 2019-01-19 NOTE — Assessment & Plan Note (Signed)
On lipitor

## 2019-01-29 ENCOUNTER — Other Ambulatory Visit: Payer: Self-pay

## 2019-01-29 ENCOUNTER — Encounter: Payer: Self-pay | Admitting: Internal Medicine

## 2019-01-29 DIAGNOSIS — I10 Essential (primary) hypertension: Secondary | ICD-10-CM

## 2019-01-29 MED ORDER — HYDROCHLOROTHIAZIDE 25 MG PO TABS: 25.0000 mg | ORAL_TABLET | Freq: Every day | ORAL | 3 refills | Status: DC

## 2019-01-31 ENCOUNTER — Ambulatory Visit (INDEPENDENT_AMBULATORY_CARE_PROVIDER_SITE_OTHER): Payer: BC Managed Care – PPO

## 2019-01-31 ENCOUNTER — Other Ambulatory Visit: Payer: Self-pay

## 2019-01-31 ENCOUNTER — Ambulatory Visit (INDEPENDENT_AMBULATORY_CARE_PROVIDER_SITE_OTHER): Payer: BC Managed Care – PPO | Admitting: Nurse Practitioner

## 2019-01-31 ENCOUNTER — Encounter (INDEPENDENT_AMBULATORY_CARE_PROVIDER_SITE_OTHER): Payer: Self-pay | Admitting: Nurse Practitioner

## 2019-01-31 VITALS — BP 167/93 | HR 58 | Resp 16 | Wt 180.0 lb

## 2019-01-31 DIAGNOSIS — I739 Peripheral vascular disease, unspecified: Secondary | ICD-10-CM

## 2019-01-31 DIAGNOSIS — Z720 Tobacco use: Secondary | ICD-10-CM

## 2019-01-31 DIAGNOSIS — I10 Essential (primary) hypertension: Secondary | ICD-10-CM

## 2019-02-04 ENCOUNTER — Encounter (INDEPENDENT_AMBULATORY_CARE_PROVIDER_SITE_OTHER): Payer: Self-pay | Admitting: Nurse Practitioner

## 2019-02-04 NOTE — Progress Notes (Signed)
SUBJECTIVE:  Patient ID: Travis Gray, male    DOB: 1955/07/12, 64 y.o.   MRN: [id number] Chief Complaint  Patient presents with  . Follow-up    ultrasound follow up    HPI  Travis Gray is a 64 y.o. male returns today for annual noninvasive studies.  The patient states that since his last visit he has had no major health changes.  He states that his claudication-like symptoms have been minimal.  He denies any any new lower extremity ulcerations or rest pain like symptoms.  He denies fever, chills, nausea vomiting or diarrhea.  Overall, the patient states his legs feel good.  The patient has ABIs of 1.02 on the bilateral lower extremities.  Previous ABI on the right was 1.23 and 1.04 on the left.  This was on 01/30/2018.  Patient has biphasic tibial artery waveforms with strong digital waveforms bilaterally.  Past Medical History:  Diagnosis Date  . Chronic headaches    migraines, 3-4x/yr  . Degenerative disc disease, cervical   . Genetic testing 03/20/2017   Multi-Cancer panel (83 genes) @ [facility] - No pathogenic mutations detected  . GERD (gastroesophageal reflux disease)   . Hypercholesterolemia   . Hypertension   . Peripheral vascular disease ([place])    groin and right leg stents  . Sleep apnea    CPAP  . Tobacco abuse     Past Surgical History:  Procedure Laterality Date  . CATARACT EXTRACTION W/PHACO Left 04/11/2018   Procedure: CATARACT EXTRACTION PHACO AND INTRAOCULAR LENS PLACEMENT ([place])  COMPLICATED LEFT;  Surgeon: Leandrew Koyanagi, MD;  Location: [place];  Service: Ophthalmology;  Laterality: Left;  OMIDRIA sleep apnea  . COLONOSCOPY WITH PROPOFOL N/A 11/25/2016   Procedure: COLONOSCOPY WITH PROPOFOL;  Surgeon: Toledo, [name], MD;  Location: [facility];  Service: Endoscopy;  Laterality: N/A;  . ESOPHAGOGASTRODUODENOSCOPY (EGD) WITH PROPOFOL N/A 11/25/2016   Procedure: ESOPHAGOGASTRODUODENOSCOPY (EGD) WITH PROPOFOL;  Surgeon: Toledo, [name],  MD;  Location: [facility];  Service: Endoscopy;  Laterality: N/A;  . HERNIA REPAIR     Inguinal,laparoscopic surgery  . PERIPHERAL VASCULAR CATHETERIZATION Right 05/21/2015   Procedure: Lower Extremity Angiography;  Surgeon: Algernon Huxley, MD;  Location: [place] [facility];  Service: Cardiovascular;  Laterality: Right;  . PERIPHERAL VASCULAR CATHETERIZATION  05/21/2015   Procedure: Lower Extremity Intervention;  Surgeon: Algernon Huxley, MD;  Location: [place] [facility];  Service: Cardiovascular;;  . radio-ablated therapy     completed in the pain clinic    Social History   Socioeconomic History  . Marital status: Married    Spouse name: Not on file  . Number of children: Not on file  . Years of education: Not on file  . Highest education level: Not on file  Occupational History  . Not on file  Tobacco Use  . Smoking status: Former Smoker    Packs/day: 1.00    Years: 37.00    Pack years: 37.00    Types: Cigarettes    Quit date: 05/25/2015    Years since quitting: 3.7  . Smokeless tobacco: Never Used  Substance and Sexual Activity  . Alcohol use: Yes    Alcohol/week: 0.0 standard drinks    Comment: very occasional alcohol use - 3-4x/yr  . Drug use: No  . Sexual activity: Not on file  Other Topics Concern  . Not on file  Social History Narrative  . Not on file   Social Determinants of Health   Financial  Resource Strain:   . Difficulty of Paying Living Expenses: Not on file  Food Insecurity:   . Worried About Charity fundraiser in the Last Year: Not on file  . Ran Out of Food in the Last Year: Not on file  Transportation Needs:   . Lack of Transportation (Medical): Not on file  . Lack of Transportation (Non-Medical): Not on file  Physical Activity:   . Days of Exercise per Week: Not on file  . Minutes of Exercise per Session: Not on file  Stress:   . Feeling of Stress : Not on file  Social Connections:   . Frequency of Communication with Friends and Family: Not  on file  . Frequency of Social Gatherings with Friends and Family: Not on file  . Attends Religious Services: Not on file  . Active Member of Clubs or Organizations: Not on file  . Attends Archivist Meetings: Not on file  . Marital Status: Not on file  Intimate Partner Violence:   . Fear of Current or Ex-Partner: Not on file  . Emotionally Abused: Not on file  . Physically Abused: Not on file  . Sexually Abused: Not on file    Family History  Problem Relation Age of Onset  . Hypertension Mother   . Breast cancer Mother 92  . Uterine cancer Mother        unconfirmed; dx 47s; TAH/BSO; currently 35  . Hyperlipidemia Father   . Bladder Cancer Paternal Grandfather 69       smoker; deceased 43  . Diabetes Maternal Grandmother   . Non-Hodgkin's lymphoma Paternal Aunt        deceased 63    No Known Allergies   Review of Systems   Review of Systems: Negative Unless Checked Constitutional: [] Weight loss  [] Fever  [] Chills Cardiac: [] Chest pain   []  Atrial Fibrillation  [] Palpitations   [] Shortness of breath when laying flat   [] Shortness of breath with exertion. [] Shortness of breath at rest Vascular:  [] Pain in legs with walking   [] Pain in legs with standing [] Pain in legs when laying flat   [] Claudication    [] Pain in feet when laying flat    [] History of DVT   [] Phlebitis   [] Swelling in legs   [] Varicose veins   [] Non-healing ulcers Pulmonary:   [] Uses home oxygen   [] Productive cough   [] Hemoptysis   [] Wheeze  [] COPD   [] Asthma Neurologic:  [] Dizziness   [] Seizures  [] Blackouts [] History of stroke   [] History of TIA  [] Aphasia   [] Temporary Blindness   [] Weakness or numbness in arm   [] Weakness or numbness in leg Musculoskeletal:   [] Joint swelling   [] Joint pain   [] Low back pain  []  History of Knee Replacement [x] Arthritis [] back Surgeries  []  Spinal Stenosis    Hematologic:  [] Easy bruising  [] Easy bleeding   [] Hypercoagulable state   [x] Anemic Gastrointestinal:   [] Diarrhea   [] Vomiting  [x] Gastroesophageal reflux/heartburn   [] Difficulty swallowing. [] Abdominal pain Genitourinary:  [] Chronic kidney disease   [] Difficult urination  [] Anuric   [] Blood in urine [] Frequent urination  [] Burning with urination   [] Hematuria Skin:  [] Rashes   [] Ulcers [] Wounds Psychological:  [] History of anxiety   []  History of major depression  []  Memory Difficulties      OBJECTIVE:   Physical Exam  BP (!) 167/93 (BP Location: Right Arm)   Pulse (!) 58   Resp 16   Wt 180 lb (81.6 kg)   BMI 26.58  kg/m   Gen: WD/WN, NAD Head: [place]/AT, No temporalis wasting.  Ear/Nose/Throat: Hearing grossly intact, nares w/o erythema or drainage Eyes: PER, EOMI, sclera nonicteric.  Neck: Supple, no masses.  No JVD.  Pulmonary:  Good air movement, no use of accessory muscles.  Cardiac: RRR Vascular:  Vessel Right Left  Radial Palpable Palpable  Dorsalis Pedis Palpable Palpable  Posterior Tibial Palpable Palpable   Gastrointestinal: soft, non-distended. No guarding/no peritoneal signs.  Musculoskeletal: M/S 5/5 throughout.  No deformity or atrophy.  Neurologic: Pain and light touch intact in extremities.  Symmetrical.  Speech is fluent. Motor exam as listed above. Psychiatric: Judgment intact, Mood & affect appropriate for pt's clinical situation.        ASSESSMENT AND PLAN:  1. Peripheral vascular disease ([place])  Recommend:  The patient has evidence of atherosclerosis of the lower extremities with minimal claudication.  The patient does not voice lifestyle limiting changes at this point in time.  Noninvasive studies do not suggest clinically significant change.  No invasive studies, angiography or surgery at this time The patient should continue walking and begin a more formal exercise program.  The patient should continue antiplatelet therapy and aggressive treatment of the lipid abnormalities  No changes in the patient's medications at this time  The patient  should continue wearing graduated compression socks 10-15 mmHg strength to control the mild edema.    2. Essential hypertension, benign Blood pressure slightly elevated today.  Good blood pressure control is essential for controlling atherosclerotic disease progression.  Patient on appropriate medications.  No changes made today.  Management will continue with primary care provider.  3. Tobacco abuse Continues to abstain.   Current Outpatient Medications on File Prior to Visit  Medication Sig Dispense Refill  . amitriptyline (ELAVIL) 25 MG tablet TAKE 1 TABLET BY MOUTH AT BEDTIME 30 tablet 5  . atorvastatin (LIPITOR) 10 MG tablet TAKE 1 TABLET BY MOUTH AT BEDTIME 30 tablet 5  . clopidogrel (PLAVIX) 75 MG tablet TAKE 1 TABLET BY MOUTH ONCE DAILY 30 tablet 11  . hydrochlorothiazide (HYDRODIURIL) 25 MG tablet Take 1 tablet (25 mg total) by mouth daily. 90 tablet 3  . losartan (COZAAR) 100 MG tablet Take 1 tablet (100 mg total) by mouth daily. 90 tablet 3  . metoprolol succinate (TOPROL-XL) 50 MG 24 hr tablet TAKE 1 TABLET BY MOUTH ONCE DAILY 30 tablet 2  . omega-3 acid ethyl esters (LOVAZA) 1 g capsule Take 1 capsule (1 g total) by mouth daily. 90 capsule 3  . pantoprazole (PROTONIX) 40 MG tablet TAKE 1 TABLET BY MOUTH ONCE DAILY 30 tablet 5  . tamsulosin (FLOMAX) 0.4 MG CAPS capsule Take 2 capsules (0.8 mg total) by mouth daily. 60 capsule 11  . vitamin B-12 (CYANOCOBALAMIN) 1000 MCG tablet Take 1,000 mcg by mouth daily.     No current facility-administered medications on file prior to visit.    There are no Patient Instructions on file for this visit. No follow-ups on file.   Kris Hartmann, NP  This note was completed with Sales executive.  Any errors are purely unintentional.

## 2019-02-11 DIAGNOSIS — C4491 Basal cell carcinoma of skin, unspecified: Secondary | ICD-10-CM | POA: Diagnosis not present

## 2019-02-11 DIAGNOSIS — L568 Other specified acute skin changes due to ultraviolet radiation: Secondary | ICD-10-CM | POA: Diagnosis not present

## 2019-02-11 DIAGNOSIS — C44629 Squamous cell carcinoma of skin of left upper limb, including shoulder: Secondary | ICD-10-CM | POA: Diagnosis not present

## 2019-02-11 DIAGNOSIS — L309 Dermatitis, unspecified: Secondary | ICD-10-CM | POA: Diagnosis not present

## 2019-02-11 DIAGNOSIS — L570 Actinic keratosis: Secondary | ICD-10-CM | POA: Diagnosis not present

## 2019-03-13 ENCOUNTER — Other Ambulatory Visit: Payer: Self-pay | Admitting: Internal Medicine

## 2019-03-29 ENCOUNTER — Other Ambulatory Visit: Payer: Self-pay | Admitting: Internal Medicine

## 2019-04-17 ENCOUNTER — Other Ambulatory Visit: Payer: Self-pay

## 2019-04-17 ENCOUNTER — Other Ambulatory Visit (INDEPENDENT_AMBULATORY_CARE_PROVIDER_SITE_OTHER): Payer: BC Managed Care – PPO

## 2019-04-17 DIAGNOSIS — I10 Essential (primary) hypertension: Secondary | ICD-10-CM | POA: Diagnosis not present

## 2019-04-17 DIAGNOSIS — R739 Hyperglycemia, unspecified: Secondary | ICD-10-CM

## 2019-04-17 DIAGNOSIS — E7800 Pure hypercholesterolemia, unspecified: Secondary | ICD-10-CM | POA: Diagnosis not present

## 2019-04-17 LAB — BASIC METABOLIC PANEL
BUN: 13 mg/dL (ref 6–23)
CO2: 29 mEq/L (ref 19–32)
Calcium: 9.2 mg/dL (ref 8.4–10.5)
Chloride: 102 mEq/L (ref 96–112)
Creatinine, Ser: 1.06 mg/dL (ref 0.40–1.50)
GFR: 70.81 mL/min (ref >60.00)
Glucose, Bld: 105 mg/dL — ABNORMAL HIGH (ref 70–99)
Potassium: 4.2 mEq/L (ref 3.5–5.1)
Sodium: 139 mEq/L (ref 135–145)

## 2019-04-17 LAB — LIPID PANEL
Cholesterol: 172 mg/dL (ref 0–200)
HDL: 25.3 mg/dL — ABNORMAL LOW (ref >39.00)
Total CHOL/HDL Ratio: 7
Triglycerides: 463 mg/dL — ABNORMAL HIGH (ref 0.0–149.0)

## 2019-04-17 LAB — HEPATIC FUNCTION PANEL
ALT: 17 U/L (ref 0–53)
AST: 18 U/L (ref 0–37)
Albumin: 4.4 g/dL (ref 3.5–5.2)
Alkaline Phosphatase: 56 U/L (ref 39–117)
Bilirubin, Direct: 0.1 mg/dL (ref 0.0–0.3)
Total Bilirubin: 0.7 mg/dL (ref 0.2–1.2)
Total Protein: 6.8 g/dL (ref 6.0–8.3)

## 2019-04-17 LAB — TSH: TSH: 3.38 u[IU]/mL (ref 0.35–4.50)

## 2019-04-17 LAB — LDL CHOLESTEROL, DIRECT: Direct LDL: 59 mg/dL

## 2019-04-17 LAB — HEMOGLOBIN A1C: Hgb A1c MFr Bld: 5.5 % (ref 4.6–6.5)

## 2019-04-19 ENCOUNTER — Ambulatory Visit (INDEPENDENT_AMBULATORY_CARE_PROVIDER_SITE_OTHER): Payer: BC Managed Care – PPO | Admitting: Internal Medicine

## 2019-04-19 ENCOUNTER — Encounter: Payer: Self-pay | Admitting: Internal Medicine

## 2019-04-19 ENCOUNTER — Other Ambulatory Visit: Payer: Self-pay

## 2019-04-19 VITALS — BP 112/70 | HR 61 | Temp 97.1°F | Resp 16 | Ht 69.0 in | Wt 176.0 lb

## 2019-04-19 DIAGNOSIS — I700 Atherosclerosis of aorta: Secondary | ICD-10-CM | POA: Diagnosis not present

## 2019-04-19 DIAGNOSIS — D649 Anemia, unspecified: Secondary | ICD-10-CM | POA: Diagnosis not present

## 2019-04-19 DIAGNOSIS — I739 Peripheral vascular disease, unspecified: Secondary | ICD-10-CM

## 2019-04-19 DIAGNOSIS — M5442 Lumbago with sciatica, left side: Secondary | ICD-10-CM

## 2019-04-19 DIAGNOSIS — E7800 Pure hypercholesterolemia, unspecified: Secondary | ICD-10-CM

## 2019-04-19 DIAGNOSIS — I10 Essential (primary) hypertension: Secondary | ICD-10-CM

## 2019-04-19 DIAGNOSIS — R109 Unspecified abdominal pain: Secondary | ICD-10-CM

## 2019-04-19 DIAGNOSIS — K219 Gastro-esophageal reflux disease without esophagitis: Secondary | ICD-10-CM

## 2019-04-19 DIAGNOSIS — Z0000 Encounter for general adult medical examination without abnormal findings: Secondary | ICD-10-CM | POA: Diagnosis not present

## 2019-04-19 DIAGNOSIS — Z720 Tobacco use: Secondary | ICD-10-CM

## 2019-04-19 DIAGNOSIS — R739 Hyperglycemia, unspecified: Secondary | ICD-10-CM

## 2019-04-19 NOTE — Progress Notes (Signed)
Patient ID: Travis Gray, male   DOB: 1955/07/16, 64 y.o.   MRN: [id number]   Subjective:    Patient ID: Travis Gray, male    DOB: [birth year], 63 y.o.   MRN: [id number]  HPI This visit occurred during the SARS-CoV-2 public health emergency.  Safety protocols were in place, including screening questions prior to the visit, additional usage of staff PPE, and extensive cleaning of exam room while observing appropriate contact time as indicated for disinfecting solutions.  Patient here for his physical exam. He is doing relatively well.  Having pain - back and hip (left).  Previously saw Dr Sharlet Salina.  S/p injection.  Helped for a while.  Worsening pain recently.  Request f/u with Dr Sharlet Salina.  Walking/standing worse.  No chest pain or sob reported.  No nausea or vomiting.  Some abdominal pain - occasional.  Has noticed some discomfort - right side - after eating.  Pain - not as frequent now.  Is going to monitor bowel movements and if certain foods trigger.  No nausea or vomiting.  No chest pain or sob reported.  Blood pressure doing well.     Past Medical History:  Diagnosis Date  . Chronic headaches    migraines, 3-4x/yr  . Degenerative disc disease, cervical   . Genetic testing 03/20/2017   Multi-Cancer panel (83 genes) @ [facility] - No pathogenic mutations detected  . GERD (gastroesophageal reflux disease)   . Hypercholesterolemia   . Hypertension   . Peripheral vascular disease ([place])    groin and right leg stents  . Sleep apnea    CPAP  . Tobacco abuse    Past Surgical History:  Procedure Laterality Date  . CATARACT EXTRACTION W/PHACO Left 04/11/2018   Procedure: CATARACT EXTRACTION PHACO AND INTRAOCULAR LENS PLACEMENT ([place])  COMPLICATED LEFT;  Surgeon: Leandrew Koyanagi, MD;  Location: [place];  Service: Ophthalmology;  Laterality: Left;  OMIDRIA sleep apnea  . COLONOSCOPY WITH PROPOFOL N/A 11/25/2016   Procedure: COLONOSCOPY WITH PROPOFOL;  Surgeon: Toledo, [name],  MD;  Location: [facility];  Service: Endoscopy;  Laterality: N/A;  . ESOPHAGOGASTRODUODENOSCOPY (EGD) WITH PROPOFOL N/A 11/25/2016   Procedure: ESOPHAGOGASTRODUODENOSCOPY (EGD) WITH PROPOFOL;  Surgeon: Toledo, [name], MD;  Location: [facility];  Service: Endoscopy;  Laterality: N/A;  . HERNIA REPAIR     Inguinal,laparoscopic surgery  . PERIPHERAL VASCULAR CATHETERIZATION Right 05/21/2015   Procedure: Lower Extremity Angiography;  Surgeon: Algernon Huxley, MD;  Location: [place] [facility];  Service: Cardiovascular;  Laterality: Right;  . PERIPHERAL VASCULAR CATHETERIZATION  05/21/2015   Procedure: Lower Extremity Intervention;  Surgeon: Algernon Huxley, MD;  Location: [place] [facility];  Service: Cardiovascular;;  . radio-ablated therapy     completed in the pain clinic   Family History  Problem Relation Age of Onset  . Hypertension Mother   . Breast cancer Mother 11  . Uterine cancer Mother        unconfirmed; dx 64s; TAH/BSO; currently 44  . Hyperlipidemia Father   . Bladder Cancer Paternal Grandfather 59       smoker; deceased 28  . Diabetes Maternal Grandmother   . Non-Hodgkin's lymphoma Paternal Aunt        deceased 47   Social History   Socioeconomic History  . Marital status: Married    Spouse name: Not on file  . Number of children: Not on file  . Years of education: Not on file  . Highest education level: Not on file  Occupational History  . Not on file  Tobacco Use  . Smoking status: Former Smoker    Packs/day: 1.00    Years: 37.00    Pack years: 37.00    Types: Cigarettes    Quit date: 05/25/2015    Years since quitting: 3.9  . Smokeless tobacco: Never Used  Substance and Sexual Activity  . Alcohol use: Yes    Alcohol/week: 0.0 standard drinks    Comment: very occasional alcohol use - 3-4x/yr  . Drug use: No  . Sexual activity: Not on file  Other Topics Concern  . Not on file  Social History Narrative  . Not on file   Social Determinants of Health     Financial Resource Strain:   . Difficulty of Paying Living Expenses:   Food Insecurity:   . Worried About Charity fundraiser in the Last Year:   . Arboriculturist in the Last Year:   Transportation Needs:   . Film/video editor (Medical):   Marland Kitchen Lack of Transportation (Non-Medical):   Physical Activity:   . Days of Exercise per Week:   . Minutes of Exercise per Session:   Stress:   . Feeling of Stress :   Social Connections:   . Frequency of Communication with Friends and Family:   . Frequency of Social Gatherings with Friends and Family:   . Attends Religious Services:   . Active Member of Clubs or Organizations:   . Attends Archivist Meetings:   Marland Kitchen Marital Status:     Outpatient Encounter Medications as of 04/19/2019  Medication Sig  . amitriptyline (ELAVIL) 25 MG tablet TAKE 1 TABLET BY MOUTH AT BEDTIME  . atorvastatin (LIPITOR) 10 MG tablet TAKE 1 TABLET BY MOUTH AT BEDTIME  . clopidogrel (PLAVIX) 75 MG tablet TAKE 1 TABLET BY MOUTH ONCE DAILY  . hydrochlorothiazide (HYDRODIURIL) 25 MG tablet Take 1 tablet (25 mg total) by mouth daily.  Marland Kitchen losartan (COZAAR) 100 MG tablet Take 1 tablet (100 mg total) by mouth daily.  . metoprolol succinate (TOPROL-XL) 50 MG 24 hr tablet TAKE 1 TABLET BY MOUTH ONCE DAILY  . omega-3 acid ethyl esters (LOVAZA) 1 g capsule Take 1 capsule (1 g total) by mouth daily.  . pantoprazole (PROTONIX) 40 MG tablet TAKE 1 TABLET BY MOUTH ONCE DAILY  . tamsulosin (FLOMAX) 0.4 MG CAPS capsule Take 2 capsules (0.8 mg total) by mouth daily.  . vitamin B-12 (CYANOCOBALAMIN) 1000 MCG tablet Take 1,000 mcg by mouth daily.   No facility-administered encounter medications on file as of 04/19/2019.   Review of Systems  Constitutional: Negative for appetite change and unexpected weight change.  HENT: Negative for congestion and sinus pressure.   Eyes: Negative for pain and visual disturbance.  Respiratory: Negative for cough, chest tightness and  shortness of breath.   Cardiovascular: Negative for chest pain, palpitations and leg swelling.  Gastrointestinal: Negative for diarrhea, nausea and vomiting.       Intermittent abdominal pain.    Genitourinary: Negative for difficulty urinating and dysuria.  Musculoskeletal: Positive for back pain. Negative for joint swelling and myalgias.       Hip pain.    Skin: Negative for color change and rash.  Neurological: Negative for dizziness, light-headedness and headaches.  Hematological: Negative for adenopathy. Does not bruise/bleed easily.  Psychiatric/Behavioral: Negative for agitation and decreased concentration.       Objective:    Physical Exam Constitutional:      General: He is  not in acute distress.    Appearance: Normal appearance. He is well-developed.  HENT:     Head: Normocephalic and atraumatic.     Right Ear: External ear normal.     Left Ear: External ear normal.  Eyes:     General: No scleral icterus.       Right eye: No discharge.        Left eye: No discharge.     Conjunctiva/sclera: Conjunctivae normal.  Neck:     Thyroid: No thyromegaly.  Cardiovascular:     Rate and Rhythm: Normal rate and regular rhythm.  Pulmonary:     Effort: No respiratory distress.     Breath sounds: Normal breath sounds. No wheezing.  Abdominal:     General: Bowel sounds are normal.     Palpations: Abdomen is soft.     Tenderness: There is no abdominal tenderness.  Genitourinary:    Comments: Not performed.   Musculoskeletal:        General: No tenderness.     Cervical back: Neck supple. No tenderness.  Lymphadenopathy:     Cervical: No cervical adenopathy.  Skin:    Findings: No erythema or rash.  Neurological:     Mental Status: He is alert and oriented to person, place, and time.  Psychiatric:        Mood and Affect: Mood normal.        Behavior: Behavior normal.     BP 112/70   Pulse 61   Temp (!) 97.1 F (36.2 C)   Resp 16   Ht 5\' 9"  (1.753 m)   Wt 176 lb  (79.8 kg)   SpO2 97%   BMI 25.99 kg/m  Wt Readings from Last 3 Encounters:  04/19/19 176 lb (79.8 kg)  01/31/19 180 lb (81.6 kg)  01/14/19 176 lb (79.8 kg)     Lab Results  Component Value Date   WBC 5.4 11/16/2018   HGB 14.4 11/16/2018   HCT 42.1 11/16/2018   PLT 200.0 11/16/2018   GLUCOSE 105 (H) 04/17/2019   CHOL 172 04/17/2019   TRIG (H) 04/17/2019    463.0 Triglyceride is over 400; calculations on Lipids are invalid.   HDL 25.30 (L) 04/17/2019   LDLDIRECT 59.0 04/17/2019   LDLCALC 60 04/24/2013   ALT 17 04/17/2019   AST 18 04/17/2019   NA 139 04/17/2019   K 4.2 04/17/2019   CL 102 04/17/2019   CREATININE 1.06 04/17/2019   BUN 13 04/17/2019   CO2 29 04/17/2019   TSH 3.38 04/17/2019   PSA 1.51 11/16/2018   INR 1.09 05/24/2015   HGBA1C 5.5 04/17/2019    CT CHEST LUNG CANCER SCREENING LOW DOSE WO CONTRAST  Result Date: 10/10/2018 CLINICAL DATA:  Former smoker, quit 3 years ago, 70 pack-year history. EXAM: CT CHEST WITHOUT CONTRAST LOW-DOSE FOR LUNG CANCER SCREENING TECHNIQUE: Multidetector CT imaging of the chest was performed following the standard protocol without IV contrast. COMPARISON:  09/14/2017. FINDINGS: Cardiovascular: Coronary artery calcification. Heart size normal. No pericardial effusion. Mediastinum/Nodes: No pathologically enlarged mediastinal or axillary lymph nodes. Hilar regions are difficult to definitively evaluate without IV contrast but appear grossly unremarkable. Esophagus is grossly unremarkable. Lungs/Pleura: Centrilobular and paraseptal emphysema. Pulmonary nodules measure 8.4 mm or less in size, as before. No new pulmonary nodules. No pleural fluid. Airway is unremarkable. Upper Abdomen: Visualized portions of the liver, gallbladder, adrenal glands, kidneys, spleen, pancreas, stomach and bowel are unremarkable. Musculoskeletal: No worrisome lytic or sclerotic lesions. IMPRESSION: 1. Lung-RADS  2, benign appearance or behavior. Continue annual  screening with low-dose chest CT without contrast in 12 months. 2. Coronary artery calcification. 3.  Emphysema (ICD10-J43.9). Electronically Signed   By: Lorin Picket [name]   On: 10/10/2018 13:54       Assessment & Plan:   Problem List Items Addressed This Visit    Abdominal pain    Persistent intermittent right side pain as outlined.  Previous CT unrevealing.  Unclear of exact etiology.  Discussed further w/up and evaluation.  Reports not as frequent.  Will monitor bowels and any possible triggers.        Anemia    Follow cbc.      Aortic atherosclerosis (HCC)    On lipitor.        Back pain    Persistent back and hip pain s/p MRI.  Saw neurosurgery.  Saw Dr Sharlet Salina.  S/p injection.  Helped.  Increased pain recently.  Plans for f/u with Dr Sharlet Salina.        Essential hypertension, benign    Blood pressure under good control.  Continue same medication regimen - cozaar and hctz.    Follow pressures.  Follow metabolic panel.        Relevant Orders   Basic metabolic panel   GERD (gastroesophageal reflux disease)    Controlled on protonix.        Health care maintenance    Physical today 04/19/19.  Colonoscopy 11/2016- moderate diverticulosis.  9mm polyp rectum and non bleeding internal hemorrhoids.        Peripheral vascular disease ([place])    S/p iliac stents.  Followed by vascular surgery.  Just evaluated 01/2019 - no significant change.  Continue risk factor modification.        Pure hypercholesterolemia    On lipitor.  Low cholesterol diet and exercise.  Follow lipid panel and liver function tests.       Relevant Orders   Hepatic function panel   Lipid panel   Tobacco abuse    He has stopped smoking.  Does need f/u CT scan.  Plans to f/u with [facility].         Other Visit Diagnoses    Hyperglycemia    -  Primary   Relevant Orders   Hemoglobin A1c       [name], MD

## 2019-04-19 NOTE — Assessment & Plan Note (Signed)
Physical today 04/19/19.  Colonoscopy 11/2016- moderate diverticulosis.  77mm polyp rectum and non bleeding internal hemorrhoids.

## 2019-04-20 ENCOUNTER — Encounter: Payer: Self-pay | Admitting: Internal Medicine

## 2019-04-20 ENCOUNTER — Telehealth: Payer: Self-pay | Admitting: Internal Medicine

## 2019-04-20 NOTE — Assessment & Plan Note (Signed)
Persistent intermittent right side pain as outlined.  Previous CT unrevealing.  Unclear of exact etiology.  Discussed further w/up and evaluation.  Reports not as frequent.  Will monitor bowels and any possible triggers.

## 2019-04-20 NOTE — Assessment & Plan Note (Signed)
Controlled on protonix.   

## 2019-04-20 NOTE — Assessment & Plan Note (Signed)
Persistent back and hip pain s/p MRI.  Saw neurosurgery.  Saw Dr Sharlet Salina.  S/p injection.  Helped.  Increased pain recently.  Plans for f/u with Dr Sharlet Salina.

## 2019-04-20 NOTE — Assessment & Plan Note (Signed)
Follow cbc.  

## 2019-04-20 NOTE — Assessment & Plan Note (Signed)
S/p iliac stents.  Followed by vascular surgery.  Just evaluated 01/2019 - no significant change.  Continue risk factor modification.

## 2019-04-20 NOTE — Assessment & Plan Note (Signed)
Blood pressure under good control.  Continue same medication regimen - cozaar and hctz.    Follow pressures.  Follow metabolic panel.

## 2019-04-20 NOTE — Assessment & Plan Note (Signed)
He has stopped smoking.  Does need f/u CT scan.  Plans to f/u with Travis Gray.

## 2019-04-20 NOTE — Assessment & Plan Note (Signed)
On lipitor

## 2019-04-20 NOTE — Telephone Encounter (Signed)
Pt sees Dr Sharlet Salina [facility] for back pain.  Increased back pain recently.  Needs f/u appt with Dr Sharlet Salina.  Please schedule and let me know if new referral is needed.  Thanks.

## 2019-04-20 NOTE — Assessment & Plan Note (Signed)
On lipitor.  Low cholesterol diet and exercise.  Follow lipid panel and liver function tests.   

## 2019-04-29 ENCOUNTER — Ambulatory Visit: Payer: BC Managed Care – PPO | Admitting: Urology

## 2019-05-01 NOTE — Telephone Encounter (Signed)
Good morning!  I called Dr Sharlet Salina ofc and pt does not need a referral. Pt will be called with an appt. I faxed over last office visit note as requested.

## 2019-05-04 ENCOUNTER — Other Ambulatory Visit: Payer: Self-pay | Admitting: Internal Medicine

## 2019-05-06 ENCOUNTER — Ambulatory Visit: Payer: BC Managed Care – PPO | Admitting: Urology

## 2019-05-06 ENCOUNTER — Other Ambulatory Visit: Payer: Self-pay

## 2019-05-06 VITALS — BP 154/91 | HR 57 | Ht 70.0 in | Wt 175.0 lb

## 2019-05-06 DIAGNOSIS — R350 Frequency of micturition: Secondary | ICD-10-CM | POA: Diagnosis not present

## 2019-05-06 DIAGNOSIS — R39198 Other difficulties with micturition: Secondary | ICD-10-CM

## 2019-05-06 DIAGNOSIS — N411 Chronic prostatitis: Secondary | ICD-10-CM | POA: Diagnosis not present

## 2019-05-06 DIAGNOSIS — R339 Retention of urine, unspecified: Secondary | ICD-10-CM | POA: Diagnosis not present

## 2019-05-06 LAB — BLADDER SCAN AMB NON-IMAGING: Scan Result: 228

## 2019-05-06 MED ORDER — TAMSULOSIN HCL 0.4 MG PO CAPS: 0.8000 mg | ORAL_CAPSULE | Freq: Every day | ORAL | 11 refills | Status: DC

## 2019-05-06 MED ORDER — SILDENAFIL CITRATE 20 MG PO TABS: 20.0000 mg | ORAL_TABLET | ORAL | 1 refills | Status: AC

## 2019-05-06 NOTE — Addendum Note (Signed)
Addended by: Alvera Novel on: 05/06/2019 09:04 AM   Modules accepted: Orders

## 2019-05-06 NOTE — Progress Notes (Signed)
05/06/2019 8:57 AM   Greta Doom 1955-04-30 [id number]  Referring provider: [name], MD [id number] [place],  [place] [address]  Chief Complaint  Patient presents with  . Prostatitis    HPI: I was consulted to assess the patient's discomfort with urination and frequency. He actually has pain in the penis when he voids but otherwise not. He is voiding every 30 to 60 minutes and has difficulty holding it for 2 hours. Since starting CPAP he rarely gets up once a night.He has no pain with ejaculation  His flow was poor. Sometimes he hesitates sometimes he stops and starts. Sometimes he strains.He does feel empty but 10 minutes later can double void a varying amount  Patient has flow symptoms and pain with urination. He has increased frequency. His residual today was 262 mL. Urine sent for culture. I will first treat him for prostatitis. Pathophysiology and treatment discussed. I called in Mobic and ciprofloxacin. It turns out he started Flomax 3 weeks ago and his flow is a bit better and he says he has less pain and I will keep him on this. Ciprofloxacin 500 mg and Mobic 15 mg sent.  Still has discomfort in the penis when he urinates. Residual today 192 mL  I double his max of 0.8 mg. Come back for cystoscopy. Get ultrasound for silent hydronephrosis. He understands it in the future he likely will need urodynamics. I want to rule out a stricture or any bladder issues causing discomfort prior  Ultrasound showed thickening of bladder wall and normal kidneys.  I distant culture was negative  Patient reported that the Flomax 0.8 mg does help the flow but he still feels discomfort in the penis but it is less.  Residual was also high on ultrasound.  No silent hydronephrosis on ultrasound  Cystoscopy: Patient underwent flexible cystoscopy.  The penile bulbar urethra normal.  He had mild to moderate bilobar enlargement of the prostate and  a normal length prostatic urethra.  He a grade 1 out of 4 bladder trabeculation.  Trigone was a little bit difficult to see due to high riding bladder neck that was minimal.    Patient has obstructive voiding symptoms and discomfort with voiding.  He understands the concept of prostatitis.  He does have elevated residuals.  Certainly surgery may not reach his treatment goal in the sense of discomfort and he could up regulate it. The role of UDS described in the more complex presentation.  Having said that there is objective elevated residuals that certainly could justify a prostate directed therapy  Patient understands very well that surgery may not reach his goal but he is actually doing quite acceptable on the Flomax 0.8 mg.  We will continue with conservative treatment.  Assess with a residual in 6 months  Today Frequency stable.  Flow stable.  Mild discomfort stable.  No bladder infections.  No flareups  Patient is able to get an erection but not able to penetrate.  He does not take nitroglycerin.  We discussed oral medication and side effects and contraindications.  Prostate 50 g benign on examination     PMH: Past Medical History:  Diagnosis Date  . Chronic headaches    migraines, 3-4x/yr  . Degenerative disc disease, cervical   . Genetic testing 03/20/2017   Multi-Cancer panel (83 genes) @ [facility] - No pathogenic mutations detected  . GERD (gastroesophageal reflux disease)   . Hypercholesterolemia   . Hypertension   . Peripheral vascular disease (  [place])    groin and right leg stents  . Sleep apnea    CPAP  . Tobacco abuse     Surgical History: Past Surgical History:  Procedure Laterality Date  . CATARACT EXTRACTION W/PHACO Left 04/11/2018   Procedure: CATARACT EXTRACTION PHACO AND INTRAOCULAR LENS PLACEMENT ([place])  COMPLICATED LEFT;  Surgeon: Leandrew Koyanagi, MD;  Location: [facility];  Service: Ophthalmology;  Laterality: Left;  OMIDRIA sleep apnea  .  COLONOSCOPY WITH PROPOFOL N/A 11/25/2016   Procedure: COLONOSCOPY WITH PROPOFOL;  Surgeon: Toledo, [name], MD;  Location: [facility];  Service: Endoscopy;  Laterality: N/A;  . ESOPHAGOGASTRODUODENOSCOPY (EGD) WITH PROPOFOL N/A 11/25/2016   Procedure: ESOPHAGOGASTRODUODENOSCOPY (EGD) WITH PROPOFOL;  Surgeon: Toledo, [name], MD;  Location: [facility];  Service: Endoscopy;  Laterality: N/A;  . HERNIA REPAIR     Inguinal,laparoscopic surgery  . PERIPHERAL VASCULAR CATHETERIZATION Right 05/21/2015   Procedure: Lower Extremity Angiography;  Surgeon: Algernon Huxley, MD;  Location: [facility];  Service: Cardiovascular;  Laterality: Right;  . PERIPHERAL VASCULAR CATHETERIZATION  05/21/2015   Procedure: Lower Extremity Intervention;  Surgeon: Algernon Huxley, MD;  Location: [facility];  Service: Cardiovascular;;  . radio-ablated therapy     completed in the pain clinic    Home Medications:  Allergies as of 05/06/2019   No Known Allergies     Medication List       Accurate as of May 06, 2019  8:57 AM. If you have any questions, ask your nurse or doctor.        amitriptyline 25 MG tablet Commonly known as: ELAVIL TAKE 1 TABLET BY MOUTH AT BEDTIME   atorvastatin 10 MG tablet Commonly known as: LIPITOR TAKE 1 TABLET BY MOUTH AT BEDTIME   clopidogrel 75 MG tablet Commonly known as: PLAVIX TAKE 1 TABLET BY MOUTH ONCE DAILY   hydrochlorothiazide 25 MG tablet Commonly known as: HYDRODIURIL Take 1 tablet (25 mg total) by mouth daily.   losartan 100 MG tablet Commonly known as: COZAAR Take 1 tablet (100 mg total) by mouth daily.   metoprolol succinate 50 MG 24 hr tablet Commonly known as: TOPROL-XL TAKE 1 TABLET BY MOUTH ONCE DAILY   omega-3 acid ethyl esters 1 g capsule Commonly known as: LOVAZA Take 1 capsule (1 g total) by mouth daily.   pantoprazole 40 MG tablet Commonly known as: PROTONIX TAKE 1 TABLET BY MOUTH ONCE DAILY   tamsulosin 0.4 MG Caps  capsule Commonly known as: FLOMAX Take 2 capsules (0.8 mg total) by mouth daily.   vitamin B-12 1000 MCG tablet Commonly known as: CYANOCOBALAMIN Take 1,000 mcg by mouth daily.       Allergies: No Known Allergies  Family History: Family History  Problem Relation Age of Onset  . Hypertension Mother   . Breast cancer Mother 20  . Uterine cancer Mother        unconfirmed; dx 46s; TAH/BSO; currently 61  . Hyperlipidemia Father   . Bladder Cancer Paternal Grandfather 45       smoker; deceased 2  . Diabetes Maternal Grandmother   . Non-Hodgkin's lymphoma Paternal Aunt        deceased 34    Social History:  reports that he quit smoking about 3 years ago. His smoking use included cigarettes. He has a 37.00 pack-year smoking history. He has never used smokeless tobacco. He reports current alcohol use. He reports that he does not use drugs.  ROS:  Physical Exam: BP (!) 154/91   Pulse (!) 57   Ht 5\' 10"  (1.778 m)   Wt 79.4 kg   BMI 25.11 kg/m    Laboratory Data: Lab Results  Component Value Date   WBC 5.4 11/16/2018   HGB 14.4 11/16/2018   HCT 42.1 11/16/2018   MCV 93.9 11/16/2018   PLT 200.0 11/16/2018    Lab Results  Component Value Date   CREATININE 1.06 04/17/2019    Lab Results  Component Value Date   PSA 1.51 11/16/2018   PSA 1.74 11/07/2017   PSA 2.03 08/22/2016    No results found for: TESTOSTERONE  Lab Results  Component Value Date   HGBA1C 5.5 04/17/2019    Urinalysis    Component Value Date/Time   COLORURINE YELLOW 08/22/2016 1600   APPEARANCEUR Clear 10/29/2018 0930   LABSPEC 1.015 08/22/2016 1600   LABSPEC 1.025 05/21/2012 1456   PHURINE 6.0 08/22/2016 1600   GLUCOSEU Negative 10/29/2018 0930   GLUCOSEU NEGATIVE 08/22/2016 1600   HGBUR NEGATIVE 08/22/2016 1600   BILIRUBINUR Negative 10/29/2018 0930   BILIRUBINUR Negative 05/21/2012 1456   KETONESUR NEGATIVE 08/22/2016  1600   PROTEINUR Negative 10/29/2018 0930   PROTEINUR Negative 05/21/2012 1456   UROBILINOGEN 0.2 01/09/2018 1049   UROBILINOGEN 0.2 08/22/2016 1600   NITRITE Negative 10/29/2018 0930   NITRITE NEGATIVE 08/22/2016 1600   LEUKOCYTESUR Negative 10/29/2018 0930   LEUKOCYTESUR Negative 05/21/2012 1456    Pertinent Imaging:   Assessment & Plan: Flomax 0.8 mg for 30 days 11 refills sent.  I gave him sildenafil 20 mg tablets that he will titrate from 1-5 as needed.  30 tablets and 1 refill given.  I will see him in 1 year.  Make additional appointment if not satisfied on sildenafil   1. Incomplete bladder emptying  - BLADDER SCAN AMB NON-IMAGING   No follow-ups on file.  Reece Packer, MD  [facility] [address] [place], [place] [address] [phone]

## 2019-05-13 DIAGNOSIS — M5126 Other intervertebral disc displacement, lumbar region: Secondary | ICD-10-CM | POA: Diagnosis not present

## 2019-05-13 DIAGNOSIS — M5416 Radiculopathy, lumbar region: Secondary | ICD-10-CM | POA: Diagnosis not present

## 2019-05-13 DIAGNOSIS — M5136 Other intervertebral disc degeneration, lumbar region: Secondary | ICD-10-CM | POA: Diagnosis not present

## 2019-06-06 DIAGNOSIS — M5126 Other intervertebral disc displacement, lumbar region: Secondary | ICD-10-CM | POA: Diagnosis not present

## 2019-06-06 DIAGNOSIS — M5136 Other intervertebral disc degeneration, lumbar region: Secondary | ICD-10-CM | POA: Diagnosis not present

## 2019-06-06 DIAGNOSIS — M5416 Radiculopathy, lumbar region: Secondary | ICD-10-CM | POA: Diagnosis not present

## 2019-06-21 ENCOUNTER — Other Ambulatory Visit (INDEPENDENT_AMBULATORY_CARE_PROVIDER_SITE_OTHER): Payer: Self-pay | Admitting: Nurse Practitioner

## 2019-06-21 ENCOUNTER — Other Ambulatory Visit: Payer: Self-pay | Admitting: Internal Medicine

## 2019-07-25 DIAGNOSIS — M5416 Radiculopathy, lumbar region: Secondary | ICD-10-CM | POA: Diagnosis not present

## 2019-07-25 DIAGNOSIS — M5136 Other intervertebral disc degeneration, lumbar region: Secondary | ICD-10-CM | POA: Diagnosis not present

## 2019-07-25 DIAGNOSIS — M5126 Other intervertebral disc displacement, lumbar region: Secondary | ICD-10-CM | POA: Diagnosis not present

## 2019-08-06 ENCOUNTER — Other Ambulatory Visit: Payer: Self-pay | Admitting: Internal Medicine

## 2019-08-06 DIAGNOSIS — M5126 Other intervertebral disc displacement, lumbar region: Secondary | ICD-10-CM | POA: Diagnosis not present

## 2019-08-06 DIAGNOSIS — M5416 Radiculopathy, lumbar region: Secondary | ICD-10-CM | POA: Diagnosis not present

## 2019-08-15 ENCOUNTER — Other Ambulatory Visit: Payer: Self-pay

## 2019-08-15 ENCOUNTER — Other Ambulatory Visit (INDEPENDENT_AMBULATORY_CARE_PROVIDER_SITE_OTHER): Payer: BC Managed Care – PPO

## 2019-08-15 DIAGNOSIS — I10 Essential (primary) hypertension: Secondary | ICD-10-CM | POA: Diagnosis not present

## 2019-08-15 DIAGNOSIS — E7800 Pure hypercholesterolemia, unspecified: Secondary | ICD-10-CM | POA: Diagnosis not present

## 2019-08-15 DIAGNOSIS — R739 Hyperglycemia, unspecified: Secondary | ICD-10-CM

## 2019-08-15 LAB — LIPID PANEL
Cholesterol: 155 mg/dL (ref 0–200)
HDL: 27.1 mg/dL — ABNORMAL LOW (ref >39.00)
NonHDL: 127.49
Total CHOL/HDL Ratio: 6
Triglycerides: 321 mg/dL — ABNORMAL HIGH (ref 0.0–149.0)
VLDL: 64.2 mg/dL — ABNORMAL HIGH (ref 0.0–40.0)

## 2019-08-15 LAB — BASIC METABOLIC PANEL
BUN: 17 mg/dL (ref 6–23)
CO2: 29 mEq/L (ref 19–32)
Calcium: 9.1 mg/dL (ref 8.4–10.5)
Chloride: 104 mEq/L (ref 96–112)
Creatinine, Ser: 1.09 mg/dL (ref 0.40–1.50)
GFR: 68.49 mL/min (ref >60.00)
Glucose, Bld: 102 mg/dL — ABNORMAL HIGH (ref 70–99)
Potassium: 3.9 mEq/L (ref 3.5–5.1)
Sodium: 140 mEq/L (ref 135–145)

## 2019-08-15 LAB — HEPATIC FUNCTION PANEL
ALT: 12 U/L (ref 0–53)
AST: 15 U/L (ref 0–37)
Albumin: 4.3 g/dL (ref 3.5–5.2)
Alkaline Phosphatase: 53 U/L (ref 39–117)
Bilirubin, Direct: 0.1 mg/dL (ref 0.0–0.3)
Total Bilirubin: 0.5 mg/dL (ref 0.2–1.2)
Total Protein: 6.7 g/dL (ref 6.0–8.3)

## 2019-08-15 LAB — HEMOGLOBIN A1C: Hgb A1c MFr Bld: 5.6 % (ref 4.6–6.5)

## 2019-08-15 LAB — LDL CHOLESTEROL, DIRECT: Direct LDL: 63 mg/dL

## 2019-08-20 ENCOUNTER — Other Ambulatory Visit: Payer: Self-pay

## 2019-08-20 ENCOUNTER — Encounter: Payer: Self-pay | Admitting: Internal Medicine

## 2019-08-20 ENCOUNTER — Ambulatory Visit (INDEPENDENT_AMBULATORY_CARE_PROVIDER_SITE_OTHER): Payer: BC Managed Care – PPO | Admitting: Internal Medicine

## 2019-08-20 DIAGNOSIS — I10 Essential (primary) hypertension: Secondary | ICD-10-CM | POA: Diagnosis not present

## 2019-08-20 DIAGNOSIS — E7800 Pure hypercholesterolemia, unspecified: Secondary | ICD-10-CM

## 2019-08-20 DIAGNOSIS — K219 Gastro-esophageal reflux disease without esophagitis: Secondary | ICD-10-CM

## 2019-08-20 DIAGNOSIS — R109 Unspecified abdominal pain: Secondary | ICD-10-CM

## 2019-08-20 DIAGNOSIS — D649 Anemia, unspecified: Secondary | ICD-10-CM

## 2019-08-20 DIAGNOSIS — I739 Peripheral vascular disease, unspecified: Secondary | ICD-10-CM | POA: Diagnosis not present

## 2019-08-20 DIAGNOSIS — M5442 Lumbago with sciatica, left side: Secondary | ICD-10-CM

## 2019-08-20 DIAGNOSIS — I700 Atherosclerosis of aorta: Secondary | ICD-10-CM

## 2019-08-20 DIAGNOSIS — F439 Reaction to severe stress, unspecified: Secondary | ICD-10-CM

## 2019-08-20 NOTE — Progress Notes (Signed)
Patient ID: Travis Gray, male   DOB: 04/23/1955, 64 y.o.   MRN: [id number]   Subjective:    Patient ID: Travis Gray, male    DOB: 12-04-1955, 64 y.o.   MRN: [id number]  HPI This visit occurred during the SARS-CoV-2 public health emergency.  Safety protocols were in place, including screening questions prior to the visit, additional usage of staff PPE, and extensive cleaning of exam room while observing appropriate contact time as indicated for disinfecting solutions.  Patient here for a scheduled follow up.  He reports he is doing relatively well.  Increased stress with dealing with his back /hip pan. Seeing Dr Sharlet Salina.  S/p injection 08/06/19. Planning for another injection in 08/2019.   Discussed with him today. Limits his activity.  Worse with walking.  Not able to work like he did previously.   Has to sit down frequently.  Has always been very active.  Does feel needs something to help level things out.  No suicidal ideations.  No chest pain or sob reported.  No cough or congestion reported.  Still with some intermittent right side pain.  Has noticed more when has been a while since had a bowel movement.  Better after bm.  Seeing urology for his bladder/prostate issues.  On flomax.  Still with urgency and increased frequency.  Using cpap regularly.  Sleep is not an issue.  Feels rested when wakes up.  Blood pressures doing well - averaging 466'Z systolic readings.     Past Medical History:  Diagnosis Date  . Chronic headaches    migraines, 3-4x/yr  . Degenerative disc disease, cervical   . Genetic testing 03/20/2017   Multi-Cancer panel (83 genes) @ [facility] - No pathogenic mutations detected  . GERD (gastroesophageal reflux disease)   . Hypercholesterolemia   . Hypertension   . Peripheral vascular disease ([place])    groin and right leg stents  . Sleep apnea    CPAP  . Tobacco abuse    Past Surgical History:  Procedure Laterality Date  . CATARACT EXTRACTION W/PHACO Left 04/11/2018    Procedure: CATARACT EXTRACTION PHACO AND INTRAOCULAR LENS PLACEMENT ([place])  COMPLICATED LEFT;  Surgeon: Leandrew Koyanagi, MD;  Location: [facility];  Service: Ophthalmology;  Laterality: Left;  OMIDRIA sleep apnea  . COLONOSCOPY WITH PROPOFOL N/A 11/25/2016   Procedure: COLONOSCOPY WITH PROPOFOL;  Surgeon: Toledo, [name], MD;  Location: [facility];  Service: Endoscopy;  Laterality: N/A;  . ESOPHAGOGASTRODUODENOSCOPY (EGD) WITH PROPOFOL N/A 11/25/2016   Procedure: ESOPHAGOGASTRODUODENOSCOPY (EGD) WITH PROPOFOL;  Surgeon: Toledo, [name], MD;  Location: [facility];  Service: Endoscopy;  Laterality: N/A;  . HERNIA REPAIR     Inguinal,laparoscopic surgery  . PERIPHERAL VASCULAR CATHETERIZATION Right 05/21/2015   Procedure: Lower Extremity Angiography;  Surgeon: Algernon Huxley, MD;  Location: [facility];  Service: Cardiovascular;  Laterality: Right;  . PERIPHERAL VASCULAR CATHETERIZATION  05/21/2015   Procedure: Lower Extremity Intervention;  Surgeon: Algernon Huxley, MD;  Location: [facility];  Service: Cardiovascular;;  . radio-ablated therapy     completed in the pain clinic   Family History  Problem Relation Age of Onset  . Hypertension Mother   . Breast cancer Mother 79  . Uterine cancer Mother        unconfirmed; dx 45s; TAH/BSO; currently 23  . Hyperlipidemia Father   . Bladder Cancer Paternal Grandfather 44       smoker; deceased 31  . Diabetes Maternal Grandmother   .  Non-Hodgkin's lymphoma Paternal Aunt        deceased 72   Social History   Socioeconomic History  . Marital status: Married    Spouse name: Not on file  . Number of children: Not on file  . Years of education: Not on file  . Highest education level: Not on file  Occupational History  . Not on file  Tobacco Use  . Smoking status: Former Smoker    Packs/day: 1.00    Years: 37.00    Pack years: 37.00    Types: Cigarettes    Quit date: 05/25/2015    Years since quitting: 4.2    . Smokeless tobacco: Never Used  Vaping Use  . Vaping Use: Never used  Substance and Sexual Activity  . Alcohol use: Yes    Alcohol/week: 0.0 standard drinks    Comment: very occasional alcohol use - 3-4x/yr  . Drug use: No  . Sexual activity: Not on file  Other Topics Concern  . Not on file  Social History Narrative  . Not on file   Social Determinants of Health   Financial Resource Strain:   . Difficulty of Paying Living Expenses:   Food Insecurity:   . Worried About Charity fundraiser in the Last Year:   . Arboriculturist in the Last Year:   Transportation Needs:   . Film/video editor (Medical):   Marland Kitchen Lack of Transportation (Non-Medical):   Physical Activity:   . Days of Exercise per Week:   . Minutes of Exercise per Session:   Stress:   . Feeling of Stress :   Social Connections:   . Frequency of Communication with Friends and Family:   . Frequency of Social Gatherings with Friends and Family:   . Attends Religious Services:   . Active Member of Clubs or Organizations:   . Attends Archivist Meetings:   Marland Kitchen Marital Status:     Outpatient Encounter Medications as of 08/20/2019  Medication Sig  . amitriptyline (ELAVIL) 25 MG tablet TAKE 1 TABLET BY MOUTH AT BEDTIME  . atorvastatin (LIPITOR) 10 MG tablet TAKE 1 TABLET BY MOUTH AT BEDTIME  . clopidogrel (PLAVIX) 75 MG tablet TAKE 1 TABLET BY MOUTH ONCE DAILY  . hydrochlorothiazide (HYDRODIURIL) 25 MG tablet Take 1 tablet (25 mg total) by mouth daily.  Marland Kitchen losartan (COZAAR) 100 MG tablet Take 1 tablet (100 mg total) by mouth daily.  . metoprolol succinate (TOPROL-XL) 50 MG 24 hr tablet TAKE 1 TABLET BY MOUTH ONCE DAILY  . omega-3 acid ethyl esters (LOVAZA) 1 g capsule Take 1 capsule (1 g total) by mouth daily.  . pantoprazole (PROTONIX) 40 MG tablet TAKE 1 TABLET BY MOUTH ONCE DAILY  . tamsulosin (FLOMAX) 0.4 MG CAPS capsule Take 2 capsules (0.8 mg total) by mouth daily.  . vitamin B-12 (CYANOCOBALAMIN) 1000  MCG tablet Take 1,000 mcg by mouth daily.   No facility-administered encounter medications on file as of 08/20/2019.    Review of Systems  Constitutional: Negative for appetite change and unexpected weight change.  HENT: Negative for congestion and sinus pressure.   Respiratory: Negative for cough, chest tightness and shortness of breath.   Cardiovascular: Negative for chest pain, palpitations and leg swelling.  Gastrointestinal: Negative for diarrhea, nausea and vomiting.       Right side abdominal pain as outlined.    Genitourinary:       Increased frequency and urgency.    Musculoskeletal: Positive for back pain.  Negative for joint swelling.       Hip pain as outlined.    Skin: Negative for color change and rash.  Neurological: Negative for dizziness, light-headedness and headaches.  Psychiatric/Behavioral: Negative for agitation and dysphoric mood.       Objective:    Physical Exam Vitals reviewed.  Constitutional:      General: He is not in acute distress.    Appearance: Normal appearance. He is well-developed.  HENT:     Head: Normocephalic and atraumatic.     Right Ear: External ear normal.     Left Ear: External ear normal.  Eyes:     General: No scleral icterus.       Right eye: No discharge.        Left eye: No discharge.     Conjunctiva/sclera: Conjunctivae normal.  Cardiovascular:     Rate and Rhythm: Normal rate and regular rhythm.  Pulmonary:     Effort: Pulmonary effort is normal. No respiratory distress.     Breath sounds: Normal breath sounds.  Abdominal:     General: Bowel sounds are normal.     Palpations: Abdomen is soft.     Tenderness: There is no abdominal tenderness.  Musculoskeletal:        General: No swelling or tenderness.  Skin:    Findings: No erythema or rash.  Neurological:     Mental Status: He is alert.  Psychiatric:        Mood and Affect: Mood normal.        Behavior: Behavior normal.     BP 122/80   Pulse 68   Temp (!)  97.3 F (36.3 C)   Resp 16   Ht 5\' 10"  (1.778 m)   Wt 171 lb (77.6 kg)   SpO2 99%   BMI 24.54 kg/m  Wt Readings from Last 3 Encounters:  08/20/19 171 lb (77.6 kg)  05/06/19 175 lb (79.4 kg)  04/19/19 176 lb (79.8 kg)     Lab Results  Component Value Date   WBC 5.4 11/16/2018   HGB 14.4 11/16/2018   HCT 42.1 11/16/2018   PLT 200.0 11/16/2018   GLUCOSE 102 (H) 08/15/2019   CHOL 155 08/15/2019   TRIG 321.0 (H) 08/15/2019   HDL 27.10 (L) 08/15/2019   LDLDIRECT 63.0 08/15/2019   LDLCALC 60 04/24/2013   ALT 12 08/15/2019   AST 15 08/15/2019   NA 140 08/15/2019   K 3.9 08/15/2019   CL 104 08/15/2019   CREATININE 1.09 08/15/2019   BUN 17 08/15/2019   CO2 29 08/15/2019   TSH 3.38 04/17/2019   PSA 1.51 11/16/2018   INR 1.09 05/24/2015   HGBA1C 5.6 08/15/2019    CT CHEST LUNG CANCER SCREENING LOW DOSE WO CONTRAST  Result Date: 10/10/2018 CLINICAL DATA:  Former smoker, quit 3 years ago, 10 pack-year history. EXAM: CT CHEST WITHOUT CONTRAST LOW-DOSE FOR LUNG CANCER SCREENING TECHNIQUE: Multidetector CT imaging of the chest was performed following the standard protocol without IV contrast. COMPARISON:  09/14/2017. FINDINGS: Cardiovascular: Coronary artery calcification. Heart size normal. No pericardial effusion. Mediastinum/Nodes: No pathologically enlarged mediastinal or axillary lymph nodes. Hilar regions are difficult to definitively evaluate without IV contrast but appear grossly unremarkable. Esophagus is grossly unremarkable. Lungs/Pleura: Centrilobular and paraseptal emphysema. Pulmonary nodules measure 8.4 mm or less in size, as before. No new pulmonary nodules. No pleural fluid. Airway is unremarkable. Upper Abdomen: Visualized portions of the liver, gallbladder, adrenal glands, kidneys, spleen, pancreas, stomach and bowel are  unremarkable. Musculoskeletal: No worrisome lytic or sclerotic lesions. IMPRESSION: 1. Lung-RADS 2, benign appearance or behavior. Continue annual  screening with low-dose chest CT without contrast in 12 months. 2. Coronary artery calcification. 3.  Emphysema (ICD10-J43.9). Electronically Signed   By: Lorin Picket [name]   On: 10/10/2018 13:54       Assessment & Plan:   Problem List Items Addressed This Visit    Abdominal pain    Persistent intermittent right side pain as outlined. Previous CT unrevealing.  Does appear to be better after bowel movement.  Wants to monitor.  Will notify me if symptoms change or desires any further w/up.       Anemia    Follow cbc.       Aortic atherosclerosis (HCC)    Continue lipitor.       Back pain    Persistent back and hip pain as outlined.  S/p MRI.  Has seen [facility] and Dr Sharlet Salina.  Persistent pain limiting his work and activity.  Continue f/u with Dr Sharlet Salina.       Essential hypertension, benign    Blood pressure as outlined.  Continue losartan and hctz.  Follow pressures.  Follow metabolic panel.       GERD (gastroesophageal reflux disease)    Upper symptoms controlled.  Protonix.        Peripheral vascular disease ([place])    S/p iliac stents.  Followed by [facility].  Continue risk factor modification.        Pure hypercholesterolemia    On lipitor.  Triglycerides improved - recent check.  Low cholesterol diet and exercise.  Follow lipid panel and liver function tests.   Lab Results  Component Value Date   CHOL 155 08/15/2019   HDL 27.10 (L) 08/15/2019   LDLCALC 60 04/24/2013   LDLDIRECT 63.0 08/15/2019   TRIG 321.0 (H) 08/15/2019   CHOLHDL 6 08/15/2019        Stress    Increased stress as outlined - trying to deal with pain.  Affecting his work and activity.  Discussed medication to help level things out.  Will d/w psychiatry - best options.           [name], MD

## 2019-08-26 ENCOUNTER — Encounter: Payer: Self-pay | Admitting: Internal Medicine

## 2019-08-26 DIAGNOSIS — F439 Reaction to severe stress, unspecified: Secondary | ICD-10-CM | POA: Insufficient documentation

## 2019-08-26 NOTE — Assessment & Plan Note (Signed)
On lipitor.  Triglycerides improved - recent check.  Low cholesterol diet and exercise.  Follow lipid panel and liver function tests.   Lab Results  Component Value Date   CHOL 155 08/15/2019   HDL 27.10 (L) 08/15/2019   LDLCALC 60 04/24/2013   LDLDIRECT 63.0 08/15/2019   TRIG 321.0 (H) 08/15/2019   CHOLHDL 6 08/15/2019

## 2019-08-26 NOTE — Assessment & Plan Note (Signed)
Upper symptoms controlled.  Protonix.  

## 2019-08-26 NOTE — Assessment & Plan Note (Signed)
S/p iliac stents.  Followed by [facility].  Continue risk factor modification.   

## 2019-08-26 NOTE — Assessment & Plan Note (Signed)
Follow cbc.  

## 2019-08-26 NOTE — Assessment & Plan Note (Signed)
Persistent intermittent right side pain as outlined. Previous CT unrevealing.  Does appear to be better after bowel movement.  Wants to monitor.  Will notify me if symptoms change or desires any further w/up.

## 2019-08-26 NOTE — Assessment & Plan Note (Signed)
Persistent back and hip pain as outlined.  S/p MRI.  Has seen [facility] and Dr Sharlet Salina.  Persistent pain limiting his work and activity.  Continue f/u with Dr Sharlet Salina.

## 2019-08-26 NOTE — Assessment & Plan Note (Signed)
Blood pressure as outlined.  Continue losartan and hctz.  Follow pressures.  Follow metabolic panel.  °

## 2019-08-26 NOTE — Assessment & Plan Note (Signed)
Increased stress as outlined - trying to deal with pain.  Affecting his work and activity.  Discussed medication to help level things out.  Will d/w psychiatry - best options.

## 2019-08-26 NOTE — Assessment & Plan Note (Signed)
Continue lipitor  ?

## 2019-08-28 DIAGNOSIS — M6281 Muscle weakness (generalized): Secondary | ICD-10-CM | POA: Diagnosis not present

## 2019-08-28 DIAGNOSIS — M5136 Other intervertebral disc degeneration, lumbar region: Secondary | ICD-10-CM | POA: Diagnosis not present

## 2019-08-28 DIAGNOSIS — M5416 Radiculopathy, lumbar region: Secondary | ICD-10-CM | POA: Diagnosis not present

## 2019-09-04 DIAGNOSIS — M5416 Radiculopathy, lumbar region: Secondary | ICD-10-CM | POA: Diagnosis not present

## 2019-09-04 DIAGNOSIS — M5136 Other intervertebral disc degeneration, lumbar region: Secondary | ICD-10-CM | POA: Diagnosis not present

## 2019-09-04 DIAGNOSIS — M6281 Muscle weakness (generalized): Secondary | ICD-10-CM | POA: Diagnosis not present

## 2019-09-09 DIAGNOSIS — M6281 Muscle weakness (generalized): Secondary | ICD-10-CM | POA: Diagnosis not present

## 2019-09-09 DIAGNOSIS — M5136 Other intervertebral disc degeneration, lumbar region: Secondary | ICD-10-CM | POA: Diagnosis not present

## 2019-09-09 DIAGNOSIS — M5416 Radiculopathy, lumbar region: Secondary | ICD-10-CM | POA: Diagnosis not present

## 2019-09-16 DIAGNOSIS — M5416 Radiculopathy, lumbar region: Secondary | ICD-10-CM | POA: Diagnosis not present

## 2019-09-16 DIAGNOSIS — M5136 Other intervertebral disc degeneration, lumbar region: Secondary | ICD-10-CM | POA: Diagnosis not present

## 2019-09-16 DIAGNOSIS — M5126 Other intervertebral disc displacement, lumbar region: Secondary | ICD-10-CM | POA: Diagnosis not present

## 2019-09-19 DIAGNOSIS — C44329 Squamous cell carcinoma of skin of other parts of face: Secondary | ICD-10-CM | POA: Diagnosis not present

## 2019-09-19 DIAGNOSIS — C44629 Squamous cell carcinoma of skin of left upper limb, including shoulder: Secondary | ICD-10-CM | POA: Diagnosis not present

## 2019-09-19 DIAGNOSIS — D485 Neoplasm of uncertain behavior of skin: Secondary | ICD-10-CM | POA: Diagnosis not present

## 2019-09-19 DIAGNOSIS — C44622 Squamous cell carcinoma of skin of right upper limb, including shoulder: Secondary | ICD-10-CM | POA: Diagnosis not present

## 2019-09-19 DIAGNOSIS — C44612 Basal cell carcinoma of skin of right upper limb, including shoulder: Secondary | ICD-10-CM | POA: Diagnosis not present

## 2019-09-19 DIAGNOSIS — C44519 Basal cell carcinoma of skin of other part of trunk: Secondary | ICD-10-CM | POA: Diagnosis not present

## 2019-09-19 DIAGNOSIS — L570 Actinic keratosis: Secondary | ICD-10-CM | POA: Diagnosis not present

## 2019-09-25 ENCOUNTER — Other Ambulatory Visit: Payer: BC Managed Care – PPO

## 2019-09-25 ENCOUNTER — Other Ambulatory Visit: Payer: Self-pay | Admitting: Critical Care Medicine

## 2019-09-25 DIAGNOSIS — Z20822 Contact with and (suspected) exposure to covid-19: Secondary | ICD-10-CM | POA: Diagnosis not present

## 2019-09-26 LAB — NOVEL CORONAVIRUS, NAA: SARS-CoV-2, NAA: NOT DETECTED

## 2019-09-28 ENCOUNTER — Telehealth: Payer: Self-pay | Admitting: *Deleted

## 2019-10-03 ENCOUNTER — Encounter: Payer: Self-pay | Admitting: Internal Medicine

## 2019-10-08 ENCOUNTER — Encounter: Payer: Self-pay | Admitting: Internal Medicine

## 2019-10-08 ENCOUNTER — Telehealth (INDEPENDENT_AMBULATORY_CARE_PROVIDER_SITE_OTHER): Payer: BC Managed Care – PPO | Admitting: Internal Medicine

## 2019-10-08 ENCOUNTER — Other Ambulatory Visit: Payer: Self-pay

## 2019-10-08 ENCOUNTER — Telehealth: Payer: Self-pay | Admitting: Internal Medicine

## 2019-10-08 ENCOUNTER — Telehealth: Payer: Self-pay

## 2019-10-08 DIAGNOSIS — Z20822 Contact with and (suspected) exposure to covid-19: Secondary | ICD-10-CM | POA: Diagnosis not present

## 2019-10-08 NOTE — Telephone Encounter (Signed)
Called [facility] and scheduled them a 5pm and 5:30 video visit like previously discussed with Dr. Nicki Reaper. They were agreeable and are on the schedule.

## 2019-10-08 NOTE — Progress Notes (Signed)
Patient ID: Travis Gray, male   DOB: [birth year], 64 y.o.   MRN: [id number]   Virtual Visit via video Note  This visit type was conducted due to national recommendations for restrictions regarding the COVID-19 pandemic (e.g. social distancing).  This format is felt to be most appropriate for this patient at this time.  All issues noted in this document were discussed and addressed.  No physical exam was performed (except for noted visual exam findings with Video Visits).   I connected with Travis Gray by a video enabled telemedicine application and verified that I am speaking with the correct person using two identifiers. Location patient: home Location provider: work  Persons participating in the virtual visit: patient, provider  The limitations, risks, security and privacy concerns of performing an evaluation and management service by video and the availability of in person appointments have been discussed.  It has also been discussed with the patient that there may be a patient responsible charge related to this service. The patient expressed understanding and agreed to proceed.   Reason for visit: work in appt.   HPI: Reports he went to the [place] Day weekend with friends and family.  States his friends son was coughing while there.  Was notified today that the parents tested positive for covid.  (parents started having symptoms including loss of taste, etc).  He reports that starting 6 days ago (10/03/19) he starte havign fever, chills, body and joint aches.  Diarrhea started the next evening.  Continues to have some diarrhea - 1-2 episodes per day.  Is eating and drinking.  No nausea or vomiting.  Minimal sinus symptoms.  Some fatigue.  Does feel a little foggy headed.  Minimal sore throat.  Some persistent chills.  No chest pain, sob, chest tightness.  Has been taking mucinex.  Has now noticed loss of taste and smell.    ROS: See pertinent positives and negatives per HPI.  Past  Medical History:  Diagnosis Date  . Chronic headaches    migraines, 3-4x/yr  . Degenerative disc disease, cervical   . Genetic testing 03/20/2017   Multi-Cancer panel (83 genes) @ [facility] - No pathogenic mutations detected  . GERD (gastroesophageal reflux disease)   . Hypercholesterolemia   . Hypertension   . Peripheral vascular disease ([place])    groin and right leg stents  . Sleep apnea    CPAP  . Tobacco abuse     Past Surgical History:  Procedure Laterality Date  . CATARACT EXTRACTION W/PHACO Left 04/11/2018   Procedure: CATARACT EXTRACTION PHACO AND INTRAOCULAR LENS PLACEMENT ([place])  COMPLICATED LEFT;  Surgeon: Leandrew Koyanagi, MD;  Location: [place];  Service: Ophthalmology;  Laterality: Left;  OMIDRIA sleep apnea  . COLONOSCOPY WITH PROPOFOL N/A 11/25/2016   Procedure: COLONOSCOPY WITH PROPOFOL;  Surgeon: Toledo, [name], MD;  Location: [facility];  Service: Endoscopy;  Laterality: N/A;  . ESOPHAGOGASTRODUODENOSCOPY (EGD) WITH PROPOFOL N/A 11/25/2016   Procedure: ESOPHAGOGASTRODUODENOSCOPY (EGD) WITH PROPOFOL;  Surgeon: Toledo, [name], MD;  Location: [facility];  Service: Endoscopy;  Laterality: N/A;  . HERNIA REPAIR     Inguinal,laparoscopic surgery  . PERIPHERAL VASCULAR CATHETERIZATION Right 05/21/2015   Procedure: Lower Extremity Angiography;  Surgeon: Algernon Huxley, MD;  Location: [place] [facility];  Service: Cardiovascular;  Laterality: Right;  . PERIPHERAL VASCULAR CATHETERIZATION  05/21/2015   Procedure: Lower Extremity Intervention;  Surgeon: Algernon Huxley, MD;  Location: [place] [facility];  Service: Cardiovascular;;  . radio-ablated  therapy     completed in the pain clinic    Family History  Problem Relation Age of Onset  . Hypertension Mother   . Breast cancer Mother 75  . Uterine cancer Mother        unconfirmed; dx 65s; TAH/BSO; currently 103  . Hyperlipidemia Father   . Bladder Cancer Paternal Grandfather 69       smoker;  deceased 42  . Diabetes Maternal Grandmother   . Non-Hodgkin's lymphoma Paternal Aunt        deceased 9    SOCIAL HX: reviewed.    Current Outpatient Medications:  .  amitriptyline (ELAVIL) 25 MG tablet, TAKE 1 TABLET BY MOUTH AT BEDTIME, Disp: 30 tablet, Rfl: 5 .  atorvastatin (LIPITOR) 10 MG tablet, TAKE 1 TABLET BY MOUTH AT BEDTIME, Disp: 30 tablet, Rfl: 5 .  clopidogrel (PLAVIX) 75 MG tablet, TAKE 1 TABLET BY MOUTH ONCE DAILY, Disp: 30 tablet, Rfl: 11 .  hydrochlorothiazide (HYDRODIURIL) 25 MG tablet, Take 1 tablet (25 mg total) by mouth daily., Disp: 90 tablet, Rfl: 3 .  losartan (COZAAR) 100 MG tablet, Take 1 tablet (100 mg total) by mouth daily., Disp: 90 tablet, Rfl: 3 .  metoprolol succinate (TOPROL-XL) 50 MG 24 hr tablet, TAKE 1 TABLET BY MOUTH ONCE DAILY, Disp: 90 tablet, Rfl: 1 .  omega-3 acid ethyl esters (LOVAZA) 1 g capsule, Take 1 capsule (1 g total) by mouth daily., Disp: 90 capsule, Rfl: 3 .  pantoprazole (PROTONIX) 40 MG tablet, TAKE 1 TABLET BY MOUTH ONCE DAILY, Disp: 30 tablet, Rfl: 5 .  tamsulosin (FLOMAX) 0.4 MG CAPS capsule, Take 2 capsules (0.8 mg total) by mouth daily., Disp: 60 capsule, Rfl: 11 .  traMADol (ULTRAM) 50 MG tablet, Take by mouth., Disp: , Rfl:  .  vitamin B-12 (CYANOCOBALAMIN) 1000 MCG tablet, Take 1,000 mcg by mouth daily., Disp: , Rfl:   EXAM:  GENERAL: alert, oriented, appears well and in no acute distress  HEENT: atraumatic, conjunttiva clear, no obvious abnormalities on inspection of external nose and ears  NECK: normal movements of the head and neck  LUNGS: on inspection no signs of respiratory distress, breathing rate appears normal, no obvious gross SOB, gasping or wheezing  CV: no obvious cyanosis  PSYCH/NEURO: pleasant and cooperative, no obvious depression or anxiety, speech and thought processing grossly intact  ASSESSMENT AND PLAN:  Discussed the following assessment and plan:  Close exposure to COVID-19 virus Went to  [place] with friends Labor Day weekend. Friends son was coughing and sick while there. Notified 10/08/19 that the friends tested positive for covid.  He started having symptoms 10/03/19 as outlined.   (wife having symptoms as well). Discussed concern regarding covid.  Discussed the need for testing.  Discussed quarantine. Will continue mucinex.  Nasal spray as directed.  Is eating and staying hydrated.  Continue to stay hydrated.  Rest.  Discussed treatment - monoclonal ab infusion and injection.  Discussed the need for quarantine.  Plans to get home test kit and check tonight.  Discussed PCR testing.    Addendum:  Notified that home test positive.  Discussed monoclonal ab infusion.  Will see if agreeable.  Follow closely.      I discussed the assessment and treatment plan with the patient. The patient was provided an opportunity to ask questions and all were answered. The patient agreed with the plan and demonstrated an understanding of the instructions.   The patient was advised to call back or seek an in-person  evaluation if the symptoms worsen or if the condition fails to improve as anticipated.   [name], MD

## 2019-10-08 NOTE — Telephone Encounter (Signed)
My chart message sent to [facility] for update

## 2019-10-09 ENCOUNTER — Encounter (INDEPENDENT_AMBULATORY_CARE_PROVIDER_SITE_OTHER): Payer: Self-pay

## 2019-10-09 ENCOUNTER — Telehealth: Payer: Self-pay | Admitting: Internal Medicine

## 2019-10-09 DIAGNOSIS — Z8616 Personal history of COVID-19: Secondary | ICD-10-CM | POA: Insufficient documentation

## 2019-10-09 NOTE — Telephone Encounter (Signed)
Pt evaluated yesterday.  covid test positive.  Please call and refer for monoclonal ab infusion.

## 2019-10-09 NOTE — Telephone Encounter (Signed)
Referral made to infusion center.

## 2019-10-09 NOTE — Assessment & Plan Note (Signed)
Went to [place] with friends Labor Day weekend. Friends son was coughing and sick while there. Notified 10/08/19 that the friends tested positive for covid.  He started having symptoms 10/03/19 as outlined.   (wife having symptoms as well). Discussed concern regarding covid.  Discussed the need for testing.  Discussed quarantine. Will continue mucinex.  Nasal spray as directed.  Is eating and staying hydrated.  Continue to stay hydrated.  Rest.  Discussed treatment - monoclonal ab infusion and injection.  Discussed the need for quarantine.  Plans to get home test kit and check tonight.  Discussed PCR testing.    Addendum:  Notified that home test positive.  Discussed monoclonal ab infusion.  Will see if agreeable.  Follow closely.

## 2019-10-10 ENCOUNTER — Encounter: Payer: Self-pay | Admitting: Physician Assistant

## 2019-10-10 ENCOUNTER — Other Ambulatory Visit: Payer: Self-pay | Admitting: Physician Assistant

## 2019-10-10 DIAGNOSIS — E669 Obesity, unspecified: Secondary | ICD-10-CM

## 2019-10-10 DIAGNOSIS — Z720 Tobacco use: Secondary | ICD-10-CM

## 2019-10-10 DIAGNOSIS — I10 Essential (primary) hypertension: Secondary | ICD-10-CM

## 2019-10-10 DIAGNOSIS — U071 COVID-19: Secondary | ICD-10-CM

## 2019-10-10 DIAGNOSIS — I739 Peripheral vascular disease, unspecified: Secondary | ICD-10-CM

## 2019-10-10 NOTE — Progress Notes (Signed)
I connected by phone with Travis Gray on 10/10/2019 at 12:44 PM to discuss the potential use of a new treatment for mild to moderate COVID-19 viral infection in non-hospitalized patients.  This patient is a 64 y.o. male that meets the FDA criteria for Emergency Use Authorization of COVID monoclonal antibody casirivimab/imdevimab.  Has a (+) direct SARS-CoV-2 viral test result  Has mild or moderate COVID-19   Is NOT hospitalized due to COVID-19  Is within 10 days of symptom onset  Has at least one of the high risk factor(s) for progression to severe COVID-19 and/or hospitalization as defined in EUA.  Specific high risk criteria : BMI > 25 and Cardiovascular disease or hypertension   I have spoken and communicated the following to the patient or parent/caregiver regarding COVID monoclonal antibody treatment:  1. FDA has authorized the emergency use for the treatment of mild to moderate COVID-19 in adults and pediatric patients with positive results of direct SARS-CoV-2 viral testing who are 32 years of age and older weighing at least 40 kg, and who are at high risk for progressing to severe COVID-19 and/or hospitalization.  2. The significant known and potential risks and benefits of COVID monoclonal antibody, and the extent to which such potential risks and benefits are unknown.  3. Information on available alternative treatments and the risks and benefits of those alternatives, including clinical trials.  4. Patients treated with COVID monoclonal antibody should continue to self-isolate and use infection control measures (e.g., wear mask, isolate, social distance, avoid sharing personal items, clean and disinfect "high touch" surfaces, and frequent handwashing) according to CDC guidelines.   5. The patient or parent/caregiver has the option to accept or refuse COVID monoclonal antibody treatment.  After reviewing this information with the patient, The patient agreed to proceed with  receiving casirivimab\imdevimab infusion and will be provided a copy of the Fact sheet prior to receiving the infusion.  Sx onset 9/9. Set up for infusion on 9/17 @ 1:30pm. Directions given to [facility]. Pt is aware that insurance will be charged an infusion fee. Pt is unvaccinated.    Angelena Form 10/10/2019 12:44 PM

## 2019-10-11 ENCOUNTER — Ambulatory Visit (HOSPITAL_COMMUNITY)
Admission: RE | Admit: 2019-10-11 | Discharge: 2019-10-11 | Disposition: A | Discharge status: Home / Self Care | Payer: BC Managed Care – PPO | Source: Ambulatory Visit | Attending: Pulmonary Disease | Admitting: Pulmonary Disease

## 2019-10-11 ENCOUNTER — Other Ambulatory Visit (HOSPITAL_COMMUNITY): Payer: Self-pay

## 2019-10-11 DIAGNOSIS — I10 Essential (primary) hypertension: Secondary | ICD-10-CM

## 2019-10-11 DIAGNOSIS — I739 Peripheral vascular disease, unspecified: Secondary | ICD-10-CM

## 2019-10-11 DIAGNOSIS — E669 Obesity, unspecified: Secondary | ICD-10-CM | POA: Diagnosis not present

## 2019-10-11 DIAGNOSIS — Z720 Tobacco use: Secondary | ICD-10-CM | POA: Diagnosis not present

## 2019-10-11 DIAGNOSIS — U071 COVID-19: Secondary | ICD-10-CM | POA: Diagnosis not present

## 2019-10-11 MED ORDER — ALBUTEROL SULFATE HFA 108 (90 BASE) MCG/ACT IN AERS: 2.0000 | INHALATION_SPRAY | Freq: Once | RESPIRATORY_TRACT | Status: DC | PRN

## 2019-10-11 MED ORDER — SODIUM CHLORIDE 0.9 % IV SOLN: INTRAVENOUS | Status: DC | PRN

## 2019-10-11 MED ORDER — FAMOTIDINE IN NACL 20-0.9 MG/50ML-% IV SOLN: 20.0000 mg | Freq: Once | INTRAVENOUS | Status: DC | PRN

## 2019-10-11 MED ORDER — SODIUM CHLORIDE 0.9 % IV SOLN
1200.0000 mg | Freq: Once | INTRAVENOUS | Status: AC
  Administered 2019-10-11: 1200 mg via INTRAVENOUS

## 2019-10-11 MED ORDER — DIPHENHYDRAMINE HCL 50 MG/ML IJ SOLN: 50.0000 mg | Freq: Once | INTRAMUSCULAR | Status: DC | PRN

## 2019-10-11 MED ORDER — EPINEPHRINE 0.3 MG/0.3ML IJ SOAJ: 0.3000 mg | Freq: Once | INTRAMUSCULAR | Status: DC | PRN

## 2019-10-11 MED ORDER — METHYLPREDNISOLONE SODIUM SUCC 125 MG IJ SOLR: 125.0000 mg | Freq: Once | INTRAMUSCULAR | Status: DC | PRN

## 2019-10-11 NOTE — Progress Notes (Signed)
Mr. Travis Gray received Regeneron as ordered. Nursing staff noted heart rate to be 50 following medication administration. Heart rate noted to be in the 50's during previous office visits. Currently asymptomatic. Ausculation with regular rate and rhythm and no murmurs. Discussed warning signs to be aware of. No immediate concerns. [place] for discharge.   Terri Piedra, NP 10/11/2019 3:41 PM

## 2019-10-11 NOTE — Discharge Instructions (Signed)

## 2019-10-11 NOTE — Progress Notes (Signed)
°  Diagnosis: COVID-19  Physician:Patrick Joya Gaskins  Procedure: Covid Infusion Clinic Med: casirivimab\imdevimab infusion - Provided patient with casirivimab\imdevimab fact sheet for patients, parents and caregivers prior to infusion.  Complications: No immediate complications noted.  Discharge: Discharged home   Heide Scales 10/11/2019

## 2019-10-14 ENCOUNTER — Telehealth: Payer: Self-pay | Admitting: *Deleted

## 2019-10-14 DIAGNOSIS — Z87891 Personal history of nicotine dependence: Secondary | ICD-10-CM

## 2019-10-14 DIAGNOSIS — Z122 Encounter for screening for malignant neoplasm of respiratory organs: Secondary | ICD-10-CM

## 2019-10-14 NOTE — Telephone Encounter (Signed)
Contacted and scheduled. Former smoker, quit 2017, 37 pack year

## 2019-10-15 ENCOUNTER — Other Ambulatory Visit: Payer: Self-pay | Admitting: Internal Medicine

## 2019-10-15 DIAGNOSIS — I10 Essential (primary) hypertension: Secondary | ICD-10-CM

## 2019-10-25 DIAGNOSIS — M5126 Other intervertebral disc displacement, lumbar region: Secondary | ICD-10-CM | POA: Diagnosis not present

## 2019-10-25 DIAGNOSIS — M5416 Radiculopathy, lumbar region: Secondary | ICD-10-CM | POA: Diagnosis not present

## 2019-10-28 ENCOUNTER — Ambulatory Visit: Payer: BC Managed Care – PPO | Admitting: Internal Medicine

## 2019-11-05 ENCOUNTER — Other Ambulatory Visit: Payer: Self-pay

## 2019-11-05 ENCOUNTER — Telehealth (INDEPENDENT_AMBULATORY_CARE_PROVIDER_SITE_OTHER): Payer: BC Managed Care – PPO | Admitting: Internal Medicine

## 2019-11-05 VITALS — Ht 70.0 in | Wt 175.0 lb

## 2019-11-05 DIAGNOSIS — Z125 Encounter for screening for malignant neoplasm of prostate: Secondary | ICD-10-CM

## 2019-11-05 DIAGNOSIS — R739 Hyperglycemia, unspecified: Secondary | ICD-10-CM

## 2019-11-05 DIAGNOSIS — E7800 Pure hypercholesterolemia, unspecified: Secondary | ICD-10-CM

## 2019-11-05 DIAGNOSIS — I739 Peripheral vascular disease, unspecified: Secondary | ICD-10-CM

## 2019-11-05 DIAGNOSIS — F439 Reaction to severe stress, unspecified: Secondary | ICD-10-CM | POA: Diagnosis not present

## 2019-11-05 DIAGNOSIS — Z8616 Personal history of COVID-19: Secondary | ICD-10-CM

## 2019-11-05 DIAGNOSIS — I10 Essential (primary) hypertension: Secondary | ICD-10-CM

## 2019-11-05 DIAGNOSIS — I700 Atherosclerosis of aorta: Secondary | ICD-10-CM

## 2019-11-05 DIAGNOSIS — M5416 Radiculopathy, lumbar region: Secondary | ICD-10-CM | POA: Diagnosis not present

## 2019-11-05 DIAGNOSIS — M5136 Other intervertebral disc degeneration, lumbar region: Secondary | ICD-10-CM | POA: Diagnosis not present

## 2019-11-05 DIAGNOSIS — M5126 Other intervertebral disc displacement, lumbar region: Secondary | ICD-10-CM | POA: Diagnosis not present

## 2019-11-05 MED ORDER — DULOXETINE HCL 30 MG PO CPEP: 30.0000 mg | ORAL_CAPSULE | Freq: Every day | ORAL | 2 refills | Status: DC

## 2019-11-05 MED ORDER — AMITRIPTYLINE HCL 25 MG PO TABS
25.0000 mg | ORAL_TABLET | Freq: Every day | ORAL | 5 refills | Status: DC
Start: 2019-11-05 — End: 2020-04-28

## 2019-11-05 NOTE — Progress Notes (Signed)
Patient ID: Travis Gray, male   DOB: 11-30-1955, 64 y.o.   MRN: [id number]   Virtual Visit via video Note  This visit type was conducted due to national recommendations for restrictions regarding the COVID-19 pandemic (e.g. social distancing).  This format is felt to be most appropriate for this patient at this time.  All issues noted in this document were discussed and addressed.  No physical exam was performed (except for noted visual exam findings with Video Visits).   I connected with [name] by a video enabled telemedicine application and verified that I am speaking with the correct person using two identifiers. Location patient: home Location provider: work  Persons participating in the virtual visit: patient, provider  The limitations, risks, security and privacy concerns of performing an evaluation and management service by video and the availability of in person appointments have been discussed.  It has also been discussed with the patient that there may be a patient responsible charge related to this service. The patient expressed understanding and agreed to proceed.   Reason for visit: follow up appt  HPI: Here to follow up regarding blood pressure, cholesterol and increased stress.  Also recently diagnosed with covid.  Regarding his recent covid infection, he reports he is feeling much better.  Taste and smell not back fully, but overall improved.  No chest pain, no chest tightness or sob.  No increased cough or congestion.  Eating.  No nausea or vomiting.  Some question of constipation.  Taking amitriptyline.  Still dealing with chronic pain.  See last note.  Had discussed medication to help level things out with this.  Discussed again with him today.  Discussed cymbalta.     ROS: See pertinent positives and negatives per HPI.  Past Medical History:  Diagnosis Date  . Chronic headaches    migraines, 3-4x/yr  . Degenerative disc disease, cervical   . Genetic testing  03/20/2017   Multi-Cancer panel (83 genes) @ [facility] - No pathogenic mutations detected  . GERD (gastroesophageal reflux disease)   . Hypercholesterolemia   . Hypertension   . Obesity   . Peripheral vascular disease ([place])    groin and right leg stents  . Sleep apnea    CPAP  . Tobacco abuse     Past Surgical History:  Procedure Laterality Date  . CATARACT EXTRACTION W/PHACO Left 04/11/2018   Procedure: CATARACT EXTRACTION PHACO AND INTRAOCULAR LENS PLACEMENT ([place])  COMPLICATED LEFT;  Surgeon: Leandrew Koyanagi, MD;  Location: [place];  Service: Ophthalmology;  Laterality: Left;  OMIDRIA sleep apnea  . COLONOSCOPY WITH PROPOFOL N/A 11/25/2016   Procedure: COLONOSCOPY WITH PROPOFOL;  Surgeon: Toledo, [name], MD;  Location: [facility];  Service: Endoscopy;  Laterality: N/A;  . ESOPHAGOGASTRODUODENOSCOPY (EGD) WITH PROPOFOL N/A 11/25/2016   Procedure: ESOPHAGOGASTRODUODENOSCOPY (EGD) WITH PROPOFOL;  Surgeon: Toledo, [name], MD;  Location: [facility];  Service: Endoscopy;  Laterality: N/A;  . HERNIA REPAIR     Inguinal,laparoscopic surgery  . PERIPHERAL VASCULAR CATHETERIZATION Right 05/21/2015   Procedure: Lower Extremity Angiography;  Surgeon: Algernon Huxley, MD;  Location: [place] [facility];  Service: Cardiovascular;  Laterality: Right;  . PERIPHERAL VASCULAR CATHETERIZATION  05/21/2015   Procedure: Lower Extremity Intervention;  Surgeon: Algernon Huxley, MD;  Location: [place] [facility];  Service: Cardiovascular;;  . radio-ablated therapy     completed in the pain clinic    Family History  Problem Relation Age of Onset  . Hypertension Mother   .  Breast cancer Mother 37  . Uterine cancer Mother        unconfirmed; dx 28s; TAH/BSO; currently 63  . Hyperlipidemia Father   . Bladder Cancer Paternal Grandfather 82       smoker; deceased 19  . Diabetes Maternal Grandmother   . Non-Hodgkin's lymphoma Paternal Aunt        deceased 83    SOCIAL HX: reviewed.     Current Outpatient Medications:  .  amitriptyline (ELAVIL) 25 MG tablet, Take 1 tablet (25 mg total) by mouth at bedtime., Disp: 30 tablet, Rfl: 5 .  atorvastatin (LIPITOR) 10 MG tablet, TAKE 1 TABLET BY MOUTH AT BEDTIME, Disp: 30 tablet, Rfl: 5 .  clopidogrel (PLAVIX) 75 MG tablet, TAKE 1 TABLET BY MOUTH ONCE DAILY, Disp: 30 tablet, Rfl: 11 .  losartan (COZAAR) 100 MG tablet, TAKE 1 TABLET BY MOUTH ONCE DAILY, Disp: 90 tablet, Rfl: 3 .  metoprolol succinate (TOPROL-XL) 50 MG 24 hr tablet, TAKE 1 TABLET BY MOUTH ONCE DAILY, Disp: 90 tablet, Rfl: 1 .  omega-3 acid ethyl esters (LOVAZA) 1 g capsule, Take 1 capsule (1 g total) by mouth daily., Disp: 90 capsule, Rfl: 3 .  tamsulosin (FLOMAX) 0.4 MG CAPS capsule, Take 2 capsules (0.8 mg total) by mouth daily., Disp: 60 capsule, Rfl: 11 .  vitamin B-12 (CYANOCOBALAMIN) 1000 MCG tablet, Take 1,000 mcg by mouth daily., Disp: , Rfl:  .  DULoxetine (CYMBALTA) 30 MG capsule, Take 1 capsule (30 mg total) by mouth daily., Disp: 30 capsule, Rfl: 2 .  hydrochlorothiazide (HYDRODIURIL) 25 MG tablet, Take 1 tablet (25 mg total) by mouth daily., Disp: 90 tablet, Rfl: 1 .  pantoprazole (PROTONIX) 40 MG tablet, Take 1 tablet (40 mg total) by mouth daily., Disp: 30 tablet, Rfl: 1  EXAM:  GENERAL: alert, oriented, appears well and in no acute distress  HEENT: atraumatic, conjunttiva clear, no obvious abnormalities on inspection of external nose and ears  NECK: normal movements of the head and neck  LUNGS: on inspection no signs of respiratory distress, breathing rate appears normal, no obvious gross SOB, gasping or wheezing  CV: no obvious cyanosis  PSYCH/NEURO: pleasant and cooperative, no obvious depression or anxiety, speech and thought processing grossly intact  ASSESSMENT AND PLAN:  Discussed the following assessment and plan:  Problem List Items Addressed This Visit    Stress    Increased stress as outlined in last note and this note. Trying  to deal with increased pain.  Affecting his work and activity.  Discussed treatment.  He is agreeable to try cymbalta.  Follow closely.  Schedule soon follow up to reassess.        Pure hypercholesterolemia    On lipitor.  Low cholesterol diet and exercise.  Follow lipid panel and liver function tests.        Relevant Orders   Hepatic function panel   Lipid panel   Peripheral vascular disease ([place])    S/p iliac stents.  Followed by [facility].  Continue risk factor modification.        Hyperglycemia    Check a1c next labs.       Relevant Orders   Hemoglobin A1c   History of COVID-19    Doing well s/p covid.  Taste and smell not fully back, but overall doing well as outlined.  Follow.        Essential hypertension, benign    Blood pressure has been doing well.  Continue losartan and hctz.  Follow pressures.  Follow metabolic panel.       Relevant Orders   CBC with Differential/Platelet   Basic metabolic panel   Aortic atherosclerosis ([facility])    Continue lipitor.         Other Visit Diagnoses    Prostate cancer screening    -  Primary   Relevant Orders   PSA       I discussed the assessment and treatment plan with the patient. The patient was provided an opportunity to ask questions and all were answered. The patient agreed with the plan and demonstrated an understanding of the instructions.   The patient was advised to call back or seek an in-person evaluation if the symptoms worsen or if the condition fails to improve as anticipated.   [name], MD

## 2019-11-08 ENCOUNTER — Other Ambulatory Visit: Payer: Self-pay | Admitting: Internal Medicine

## 2019-11-08 MED ORDER — PANTOPRAZOLE SODIUM 40 MG PO TBEC
40.0000 mg | DELAYED_RELEASE_TABLET | Freq: Every day | ORAL | 1 refills | Status: DC
Start: 2019-11-08 — End: 2019-12-09

## 2019-11-08 MED ORDER — HYDROCHLOROTHIAZIDE 25 MG PO TABS
25.0000 mg | ORAL_TABLET | Freq: Every day | ORAL | 1 refills | Status: DC
Start: 2019-11-08 — End: 2020-02-07

## 2019-11-08 NOTE — Addendum Note (Signed)
Addended by: Ezequiel Ganser on: 11/08/2019 02:22 PM   Modules accepted: Orders

## 2019-11-10 ENCOUNTER — Encounter: Payer: Self-pay | Admitting: Internal Medicine

## 2019-11-10 DIAGNOSIS — R739 Hyperglycemia, unspecified: Secondary | ICD-10-CM | POA: Insufficient documentation

## 2019-11-10 NOTE — Assessment & Plan Note (Signed)
Check a1c next labs.

## 2019-11-10 NOTE — Assessment & Plan Note (Signed)
Increased stress as outlined in last note and this note. Trying to deal with increased pain.  Affecting his work and activity.  Discussed treatment.  He is agreeable to try cymbalta.  Follow closely.  Schedule soon follow up to reassess.

## 2019-11-10 NOTE — Assessment & Plan Note (Signed)
Blood pressure has been doing well.  Continue losartan and hctz.  Follow pressures. Follow metabolic panel.

## 2019-11-10 NOTE — Assessment & Plan Note (Signed)
S/p iliac stents.  Followed by [facility].  Continue risk factor modification.

## 2019-11-10 NOTE — Assessment & Plan Note (Signed)
Continue lipitor  ?

## 2019-11-10 NOTE — Assessment & Plan Note (Signed)
On lipitor.  Low cholesterol diet and exercise.  Follow lipid panel and liver function tests.   

## 2019-11-10 NOTE — Assessment & Plan Note (Signed)
Doing well s/p covid.  Taste and smell not fully back, but overall doing well as outlined.  Follow.

## 2019-11-20 DIAGNOSIS — L814 Other melanin hyperpigmentation: Secondary | ICD-10-CM | POA: Diagnosis not present

## 2019-11-20 DIAGNOSIS — L578 Other skin changes due to chronic exposure to nonionizing radiation: Secondary | ICD-10-CM | POA: Diagnosis not present

## 2019-11-20 DIAGNOSIS — C44329 Squamous cell carcinoma of skin of other parts of face: Secondary | ICD-10-CM | POA: Diagnosis not present

## 2019-11-20 DIAGNOSIS — L988 Other specified disorders of the skin and subcutaneous tissue: Secondary | ICD-10-CM | POA: Diagnosis not present

## 2019-11-28 DIAGNOSIS — H43812 Vitreous degeneration, left eye: Secondary | ICD-10-CM | POA: Diagnosis not present

## 2019-12-09 ENCOUNTER — Other Ambulatory Visit: Payer: Self-pay | Admitting: Internal Medicine

## 2019-12-12 ENCOUNTER — Other Ambulatory Visit: Payer: Self-pay

## 2019-12-12 ENCOUNTER — Ambulatory Visit
Admission: RE | Admit: 2019-12-12 | Discharge: 2019-12-12 | Disposition: A | Discharge status: Home / Self Care | Payer: BC Managed Care – PPO | Source: Ambulatory Visit | Attending: Oncology | Admitting: Oncology

## 2019-12-12 DIAGNOSIS — Z87891 Personal history of nicotine dependence: Secondary | ICD-10-CM | POA: Diagnosis not present

## 2019-12-12 DIAGNOSIS — Z122 Encounter for screening for malignant neoplasm of respiratory organs: Secondary | ICD-10-CM | POA: Insufficient documentation

## 2019-12-17 ENCOUNTER — Encounter: Payer: Self-pay | Admitting: *Deleted

## 2019-12-30 ENCOUNTER — Other Ambulatory Visit: Payer: Self-pay | Admitting: Internal Medicine

## 2020-01-07 ENCOUNTER — Other Ambulatory Visit: Payer: Self-pay | Admitting: Internal Medicine

## 2020-01-09 ENCOUNTER — Other Ambulatory Visit (INDEPENDENT_AMBULATORY_CARE_PROVIDER_SITE_OTHER): Payer: BC Managed Care – PPO

## 2020-01-09 ENCOUNTER — Other Ambulatory Visit: Payer: Self-pay

## 2020-01-09 DIAGNOSIS — R739 Hyperglycemia, unspecified: Secondary | ICD-10-CM | POA: Diagnosis not present

## 2020-01-09 DIAGNOSIS — I10 Essential (primary) hypertension: Secondary | ICD-10-CM

## 2020-01-09 DIAGNOSIS — E7800 Pure hypercholesterolemia, unspecified: Secondary | ICD-10-CM

## 2020-01-09 DIAGNOSIS — Z125 Encounter for screening for malignant neoplasm of prostate: Secondary | ICD-10-CM | POA: Diagnosis not present

## 2020-01-09 LAB — BASIC METABOLIC PANEL
BUN: 20 mg/dL (ref 6–23)
CO2: 30 mEq/L (ref 19–32)
Calcium: 9 mg/dL (ref 8.4–10.5)
Chloride: 102 mEq/L (ref 96–112)
Creatinine, Ser: 1.2 mg/dL (ref 0.40–1.50)
GFR: 64.74 mL/min (ref >60.00)
Glucose, Bld: 94 mg/dL (ref 70–99)
Potassium: 3.8 mEq/L (ref 3.5–5.1)
Sodium: 139 mEq/L (ref 135–145)

## 2020-01-09 LAB — LIPID PANEL
Cholesterol: 182 mg/dL (ref 0–200)
HDL: 26.2 mg/dL — ABNORMAL LOW (ref >39.00)
Total CHOL/HDL Ratio: 7
Triglycerides: 560 mg/dL — ABNORMAL HIGH (ref 0.0–149.0)

## 2020-01-09 LAB — HEPATIC FUNCTION PANEL
ALT: 14 U/L (ref 0–53)
AST: 13 U/L (ref 0–37)
Albumin: 4.3 g/dL (ref 3.5–5.2)
Alkaline Phosphatase: 58 U/L (ref 39–117)
Bilirubin, Direct: 0.1 mg/dL (ref 0.0–0.3)
Total Bilirubin: 0.5 mg/dL (ref 0.2–1.2)
Total Protein: 6.7 g/dL (ref 6.0–8.3)

## 2020-01-09 LAB — CBC WITH DIFFERENTIAL/PLATELET
Basophils Absolute: 0 10*3/uL (ref 0.0–0.1)
Basophils Relative: 0.5 % (ref 0.0–3.0)
Eosinophils Absolute: 0.2 10*3/uL (ref 0.0–0.7)
Eosinophils Relative: 3.3 % (ref 0.0–5.0)
HCT: 40.8 % (ref 39.0–52.0)
Hemoglobin: 14.2 g/dL (ref 13.0–17.0)
Lymphocytes Relative: 39.8 % (ref 12.0–46.0)
Lymphs Abs: 2.3 10*3/uL (ref 0.7–4.0)
MCHC: 34.9 g/dL (ref 30.0–36.0)
MCV: 93 fl (ref 78.0–100.0)
Monocytes Absolute: 0.6 10*3/uL (ref 0.1–1.0)
Monocytes Relative: 10.5 % (ref 3.0–12.0)
Neutro Abs: 2.7 10*3/uL (ref 1.4–7.7)
Neutrophils Relative %: 45.9 % (ref 43.0–77.0)
Platelets: 191 10*3/uL (ref 150.0–400.0)
RBC: 4.39 Mil/uL (ref 4.22–5.81)
RDW: 12.9 % (ref 11.5–15.5)
WBC: 5.9 10*3/uL (ref 4.0–10.5)

## 2020-01-09 LAB — PSA: PSA: 1.84 ng/mL (ref 0.10–4.00)

## 2020-01-09 LAB — HEMOGLOBIN A1C: Hgb A1c MFr Bld: 5.5 % (ref 4.6–6.5)

## 2020-01-09 LAB — LDL CHOLESTEROL, DIRECT: Direct LDL: 69 mg/dL

## 2020-01-10 ENCOUNTER — Ambulatory Visit (INDEPENDENT_AMBULATORY_CARE_PROVIDER_SITE_OTHER): Payer: BC Managed Care – PPO | Admitting: Internal Medicine

## 2020-01-10 DIAGNOSIS — I739 Peripheral vascular disease, unspecified: Secondary | ICD-10-CM

## 2020-01-10 DIAGNOSIS — I10 Essential (primary) hypertension: Secondary | ICD-10-CM | POA: Diagnosis not present

## 2020-01-10 DIAGNOSIS — I700 Atherosclerosis of aorta: Secondary | ICD-10-CM

## 2020-01-10 DIAGNOSIS — N411 Chronic prostatitis: Secondary | ICD-10-CM

## 2020-01-10 DIAGNOSIS — Z720 Tobacco use: Secondary | ICD-10-CM

## 2020-01-10 DIAGNOSIS — F439 Reaction to severe stress, unspecified: Secondary | ICD-10-CM

## 2020-01-10 DIAGNOSIS — E7800 Pure hypercholesterolemia, unspecified: Secondary | ICD-10-CM

## 2020-01-10 DIAGNOSIS — R739 Hyperglycemia, unspecified: Secondary | ICD-10-CM

## 2020-01-10 DIAGNOSIS — K219 Gastro-esophageal reflux disease without esophagitis: Secondary | ICD-10-CM

## 2020-01-10 NOTE — Progress Notes (Signed)
Patient ID: Travis Gray, male   DOB: July 06, 1955, 64 y.o.   MRN: [id number]   Subjective:    Patient ID: Travis Gray, male    DOB: [birth year], 64 y.o.   MRN: [id number]  HPI This visit occurred during the SARS-CoV-2 public health emergency.  Safety protocols were in place, including screening questions prior to the visit, additional usage of staff PPE, and extensive cleaning of exam room while observing appropriate contact time as indicated for disinfecting solutions.  Patient here for a scheduled follow up.  Last visit, started on cymbalta for increased stress.  Also to help with pain. Taking cymbalta.  Tolerating.  Feels things are stable.  Pain still limits some of his activity.  Still tries to stay active.  No chest pain or sob reported.  No abdominal pain.  Bowels moving.  Taking flomax.  Does report noticing a burning sensation.  Hard to describe.  May only last for a few seconds and then resolves.  Sensation felt around his rectum and penis.  No pain or burning with urination or when having a bowel movement.  No fever.  States he does not feel bad.  No real change in his urine or bowel movements.  Discussed labs.  Triglycerides elevated.    Past Medical History:  Diagnosis Date  . Chronic headaches    migraines, 3-4x/yr  . Degenerative disc disease, cervical   . Genetic testing 03/20/2017   Multi-Cancer panel (83 genes) @ [facility] - No pathogenic mutations detected  . GERD (gastroesophageal reflux disease)   . Hypercholesterolemia   . Hypertension   . Obesity   . Peripheral vascular disease ([place])    groin and right leg stents  . Sleep apnea    CPAP  . Tobacco abuse    Past Surgical History:  Procedure Laterality Date  . CATARACT EXTRACTION W/PHACO Left 04/11/2018   Procedure: CATARACT EXTRACTION PHACO AND INTRAOCULAR LENS PLACEMENT ([place])  COMPLICATED LEFT;  Surgeon: Leandrew Koyanagi, MD;  Location: [place];  Service: Ophthalmology;  Laterality: Left;   OMIDRIA sleep apnea  . COLONOSCOPY WITH PROPOFOL N/A 11/25/2016   Procedure: COLONOSCOPY WITH PROPOFOL;  Surgeon: Toledo, [name], MD;  Location: [facility];  Service: Endoscopy;  Laterality: N/A;  . ESOPHAGOGASTRODUODENOSCOPY (EGD) WITH PROPOFOL N/A 11/25/2016   Procedure: ESOPHAGOGASTRODUODENOSCOPY (EGD) WITH PROPOFOL;  Surgeon: Toledo, [name], MD;  Location: [facility];  Service: Endoscopy;  Laterality: N/A;  . HERNIA REPAIR     Inguinal,laparoscopic surgery  . PERIPHERAL VASCULAR CATHETERIZATION Right 05/21/2015   Procedure: Lower Extremity Angiography;  Surgeon: Algernon Huxley, MD;  Location: [place] [facility];  Service: Cardiovascular;  Laterality: Right;  . PERIPHERAL VASCULAR CATHETERIZATION  05/21/2015   Procedure: Lower Extremity Intervention;  Surgeon: Algernon Huxley, MD;  Location: [place] [facility];  Service: Cardiovascular;;  . radio-ablated therapy     completed in the pain clinic   Family History  Problem Relation Age of Onset  . Hypertension Mother   . Breast cancer Mother 61  . Uterine cancer Mother        unconfirmed; dx 60s; TAH/BSO; currently 74  . Hyperlipidemia Father   . Bladder Cancer Paternal Grandfather 71       smoker; deceased 53  . Diabetes Maternal Grandmother   . Non-Hodgkin's lymphoma Paternal Aunt        deceased 51   Social History   Socioeconomic History  . Marital status: Married    Spouse name: Not on file  .  Number of children: Not on file  . Years of education: Not on file  . Highest education level: Not on file  Occupational History  . Not on file  Tobacco Use  . Smoking status: Former Smoker    Packs/day: 1.00    Years: 37.00    Pack years: 37.00    Types: Cigarettes    Quit date: 05/25/2015    Years since quitting: 4.6  . Smokeless tobacco: Never Used  Vaping Use  . Vaping Use: Never used  Substance and Sexual Activity  . Alcohol use: Yes    Alcohol/week: 0.0 standard drinks    Comment: very occasional alcohol use -  3-4x/yr  . Drug use: No  . Sexual activity: Not on file  Other Topics Concern  . Not on file  Social History Narrative  . Not on file   Social Determinants of Health   Financial Resource Strain: Not on file  Food Insecurity: Not on file  Transportation Needs: Not on file  Physical Activity: Not on file  Stress: Not on file  Social Connections: Not on file    Outpatient Encounter Medications as of 01/10/2020  Medication Sig  . amitriptyline (ELAVIL) 25 MG tablet Take 1 tablet (25 mg total) by mouth at bedtime.  Marland Kitchen atorvastatin (LIPITOR) 10 MG tablet TAKE 1 TABLET BY MOUTH AT BEDTIME  . clopidogrel (PLAVIX) 75 MG tablet TAKE 1 TABLET BY MOUTH ONCE DAILY  . DULoxetine (CYMBALTA) 30 MG capsule Take 1 capsule (30 mg total) by mouth daily.  . hydrochlorothiazide (HYDRODIURIL) 25 MG tablet Take 1 tablet (25 mg total) by mouth daily.  Marland Kitchen losartan (COZAAR) 100 MG tablet TAKE 1 TABLET BY MOUTH ONCE DAILY  . metoprolol succinate (TOPROL-XL) 50 MG 24 hr tablet TAKE 1 TABLET BY MOUTH ONCE DAILY  . omega-3 acid ethyl esters (LOVAZA) 1 g capsule Take 1 capsule (1 g total) by mouth daily.  . pantoprazole (PROTONIX) 40 MG tablet TAKE 1 TABLET BY MOUTH ONCE DAILY  . tamsulosin (FLOMAX) 0.4 MG CAPS capsule Take 2 capsules (0.8 mg total) by mouth daily.  . vitamin B-12 (CYANOCOBALAMIN) 1000 MCG tablet Take 1,000 mcg by mouth daily.   No facility-administered encounter medications on file as of 01/10/2020.    Review of Systems  Constitutional: Negative for appetite change, fever and unexpected weight change.  HENT: Negative for congestion and sinus pressure.   Respiratory: Negative for cough, chest tightness and shortness of breath.   Cardiovascular: Negative for chest pain, palpitations and leg swelling.  Gastrointestinal: Negative for abdominal pain, diarrhea, nausea and vomiting.  Genitourinary: Negative for difficulty urinating and dysuria.  Musculoskeletal: Negative for joint swelling and  myalgias.  Skin: Negative for color change and rash.  Neurological: Negative for dizziness, light-headedness and headaches.  Psychiatric/Behavioral: Negative for agitation and dysphoric mood.       Objective:    Physical Exam Vitals reviewed.  Constitutional:      General: He is not in acute distress.    Appearance: Normal appearance. He is well-developed and well-nourished.  HENT:     Head: Normocephalic and atraumatic.     Right Ear: External ear normal.     Left Ear: External ear normal.  Eyes:     General: No scleral icterus.       Right eye: No discharge.        Left eye: No discharge.     Conjunctiva/sclera: Conjunctivae normal.  Cardiovascular:     Rate and Rhythm: Normal rate and  regular rhythm.     Comments: Abdominal bruit Pulmonary:     Effort: Pulmonary effort is normal. No respiratory distress.     Breath sounds: Normal breath sounds.  Abdominal:     General: Bowel sounds are normal.     Palpations: Abdomen is soft.     Tenderness: There is no abdominal tenderness.  Genitourinary:    Comments: Rectal exam:  Enlarged prostate.  No significant tenderness.  Musculoskeletal:        General: No swelling, tenderness or edema.     Cervical back: Neck supple. No tenderness.  Lymphadenopathy:     Cervical: No cervical adenopathy.  Skin:    Findings: No erythema or rash.  Neurological:     Mental Status: He is alert.  Psychiatric:        Mood and Affect: Mood and affect and mood normal.        Behavior: Behavior normal.     BP 114/76   Pulse 66   Temp 97.8 F (36.6 C) (Oral)   Resp 16   Ht 5' 10"  (1.778 m)   Wt 172 lb (78 kg)   SpO2 97%   BMI 24.68 kg/m  Wt Readings from Last 3 Encounters:  01/10/20 172 lb (78 kg)  12/12/19 175 lb (79.4 kg)  11/05/19 175 lb (79.4 kg)     Lab Results  Component Value Date   WBC 5.9 01/09/2020   HGB 14.2 01/09/2020   HCT 40.8 01/09/2020   PLT 191.0 01/09/2020   GLUCOSE 94 01/09/2020   CHOL 182 01/09/2020    TRIG (H) 01/09/2020    560.0 Triglyceride is over 400; calculations on Lipids are invalid.   HDL 26.20 (L) 01/09/2020   LDLDIRECT 69.0 01/09/2020   LDLCALC 60 04/24/2013   ALT 14 01/09/2020   AST 13 01/09/2020   NA 139 01/09/2020   K 3.8 01/09/2020   CL 102 01/09/2020   CREATININE 1.20 01/09/2020   BUN 20 01/09/2020   CO2 30 01/09/2020   TSH 3.38 04/17/2019   PSA 1.84 01/09/2020   INR 1.09 05/24/2015   HGBA1C 5.5 01/09/2020    CT CHEST LUNG CANCER SCREENING LOW DOSE WO CONTRAST  Result Date: 12/12/2019 CLINICAL DATA:  64 year old male with 37 pack-year history of smoking. Lung cancer screening. EXAM: CT CHEST WITHOUT CONTRAST LOW-DOSE FOR LUNG CANCER SCREENING TECHNIQUE: Multidetector CT imaging of the chest was performed following the standard protocol without IV contrast. COMPARISON:  10/10/2018 FINDINGS: Cardiovascular: The heart size is normal. No substantial pericardial effusion. No thoracic aortic aneurysm. Mediastinum/Nodes: No mediastinal lymphadenopathy. No evidence for gross hilar lymphadenopathy although assessment is limited by the lack of intravenous contrast on today's study. The esophagus has normal imaging features. There is no axillary lymphadenopathy. Lungs/Pleura: Centrilobular and paraseptal emphysema evident. Previously identified pulmonary nodules are stable. Scattered tiny bilateral calcified and noncalcified pulmonary nodules again noted, stable in the interval. No new suspicious pulmonary nodule or mass. No focal airspace consolidation. No pleural effusion. Upper Abdomen: Unremarkable Musculoskeletal: No worrisome lytic or sclerotic osseous abnormality. IMPRESSION: 1. Lung-RADS 2, benign appearance or behavior. Continue annual screening with low-dose chest CT without contrast in 12 months. 2. Emphysema (ICD10-J43.9). Electronically Signed   By: Misty Stanley [name]   On: 12/12/2019 11:25       Assessment & Plan:   Problem List Items Addressed This Visit    Pure  hypercholesterolemia    Low carb diet and exercise.  Continue lipitor.  LDL 69.  Triglycerides elevated.  On fish oil.  Follow.        Essential hypertension, benign    Continue losartan and hctz.  Blood pressure doing well.  Follow pressures.  Follow metabolic panel.       Tobacco abuse    He had stopped smoking.  Increased stress recently with his uncle passing.  Smoked a few cigarettes then.  Has stopped.  Continue to encourage to remain off cigarettes.  Follow.       Peripheral vascular disease ([place])    S/p iliac stents.  Followed by [facility].  Has f/u planned in 01/2020.  Continue plavix and statin medication.  D/w Dr Lucky Cowboy regarding abdominal bruit and question of need for any further testing.        GERD (gastroesophageal reflux disease)    EGD 11/2016 - chronic gastritis.  Continue protonix.  Controlled.        Aortic atherosclerosis ([facility])    Continue lipitor.       Chronic prostatitis    If followed by urology.  On flomax.  Symptoms as outlined.  Unclear etiology.  Vague/fleeting burning symptoms.  Hold abx.  D/w urology.  Follow.        Stress    Increased stress as outlined.  On cymbalta.  Continue.  Stable.  Follow.        Hyperglycemia    Low carb diet and exercise.  Follow met b and a1c.           [name], MD

## 2020-01-11 ENCOUNTER — Telehealth: Payer: Self-pay | Admitting: Internal Medicine

## 2020-01-11 ENCOUNTER — Encounter: Payer: Self-pay | Admitting: Internal Medicine

## 2020-01-11 NOTE — Assessment & Plan Note (Signed)
If followed by urology.  On flomax.  Symptoms as outlined.  Unclear etiology.  Vague/fleeting burning symptoms.  Hold abx.  D/w urology.  Follow.

## 2020-01-11 NOTE — Assessment & Plan Note (Signed)
Continue lipitor  ?

## 2020-01-11 NOTE — Assessment & Plan Note (Signed)
He had stopped smoking.  Increased stress recently with his uncle passing.  Smoked a few cigarettes then.  Has stopped.  Continue to encourage to remain off cigarettes.  Follow.

## 2020-01-11 NOTE — Assessment & Plan Note (Signed)
S/p iliac stents.  Followed by [facility].  Has f/u planned in 01/2020.  Continue plavix and statin medication.  D/w Dr Lucky Cowboy regarding abdominal bruit and question of need for any further testing.

## 2020-01-11 NOTE — Assessment & Plan Note (Signed)
Continue losartan and hctz.  Blood pressure doing well.  Follow pressures.  Follow metabolic panel.

## 2020-01-11 NOTE — Assessment & Plan Note (Signed)
Low carb diet and exercise.  Follow met b and a1c.  

## 2020-01-11 NOTE — Telephone Encounter (Signed)
My chart message sent to pt for update.   

## 2020-01-11 NOTE — Assessment & Plan Note (Signed)
EGD 11/2016 - chronic gastritis.  Continue protonix.  Controlled.

## 2020-01-11 NOTE — Assessment & Plan Note (Signed)
Low carb diet and exercise.  Continue lipitor.  LDL 69.  Triglycerides elevated.  On fish oil.  Follow.

## 2020-01-11 NOTE — Assessment & Plan Note (Signed)
Increased stress as outlined.  On cymbalta.  Continue.  Stable.  Follow.

## 2020-01-11 NOTE — Telephone Encounter (Signed)
-----   Message from Algernon Huxley, MD sent at 01/11/2020  4:25 PM EST ----- Regarding: RE: question Gotcha. We can do an aortoiliac duplex when he comes in. Thanks for letting me know.  Merry Christmas. ----- Message ----- From: [name], MD Sent: 01/11/2020   4:04 PM EST To: Algernon Huxley, MD Subject: question                                       Hope you are doing well.  Just wanted to run something by you.  I saw Travis Gray for routine follow up.  You see him for f/u PVD s/p iliac stent placement.  On exam, noticed abdominal bruit.  He has a f/u with you in January.  Not sure if any further scanning/testing needed.  Just wanted you to be aware.  Let me know if I need to do anything more prior to you seeing him.    Hope you have a nice Christmas.  Tanea Moga

## 2020-01-27 ENCOUNTER — Other Ambulatory Visit (INDEPENDENT_AMBULATORY_CARE_PROVIDER_SITE_OTHER): Payer: Self-pay | Admitting: Vascular Surgery

## 2020-01-27 ENCOUNTER — Other Ambulatory Visit (INDEPENDENT_AMBULATORY_CARE_PROVIDER_SITE_OTHER): Payer: Self-pay | Admitting: Nurse Practitioner

## 2020-01-27 DIAGNOSIS — I739 Peripheral vascular disease, unspecified: Secondary | ICD-10-CM

## 2020-01-27 DIAGNOSIS — R0989 Other specified symptoms and signs involving the circulatory and respiratory systems: Secondary | ICD-10-CM

## 2020-01-28 ENCOUNTER — Telehealth: Payer: Self-pay | Admitting: Internal Medicine

## 2020-01-28 DIAGNOSIS — E7800 Pure hypercholesterolemia, unspecified: Secondary | ICD-10-CM

## 2020-01-28 DIAGNOSIS — R739 Hyperglycemia, unspecified: Secondary | ICD-10-CM

## 2020-01-28 DIAGNOSIS — R300 Dysuria: Secondary | ICD-10-CM

## 2020-01-28 DIAGNOSIS — I10 Essential (primary) hypertension: Secondary | ICD-10-CM

## 2020-01-28 MED ORDER — ATORVASTATIN CALCIUM 20 MG PO TABS: 20.0000 mg | ORAL_TABLET | Freq: Every day | ORAL | 1 refills | Status: DC

## 2020-01-28 NOTE — Telephone Encounter (Signed)
I spoke to urology and Mr Wiederholt is aware.  He needs to come by and give a urine sample.  He is going to call and let us know when he is back on this side of town and leave a sample.  Labs are in.

## 2020-01-28 NOTE — Telephone Encounter (Signed)
Pt scheduled  

## 2020-01-28 NOTE — Telephone Encounter (Signed)
-----   Message from Alfredo Martinez, MD sent at 01/13/2020  8:17 AM EST ----- Regarding: RE: question Thanks for update Urine culture would be good Symptoms in keeping with prostatitis Depending on severity I can see him sooner or keep appointment thanks     ----- Message ----- From: Travis Chalfant, MD Sent: 01/11/2020   4:18 PM EST To: Alfredo Martinez, MD Subject: question                                       I saw Travis Gray for routine follow up.  You see him for f/u chronic prostatitis.  He is currently on flomax.  He is having some vague symptoms that he describes as burning - noticed round his penis and rectum.  May only lasts for a few seconds.  Does not appear to be related to having a bowel movement and no actual dysuria.  No rash.  Prostate exam - enlarged, but no significant tenderness.  Has been a persistent intermittent issue for a few weeks now.  I was not sure of the cause.  He wanted to make you aware of symptoms and I wanted to see if you had any recommendations for me.  If you need to see him prior to his scheduled appt, I can let him know.    Thank you for your help.   Travis Gray

## 2020-01-28 NOTE — Telephone Encounter (Signed)
Spoke to pt.  Given cholesterol numbers, will increase lipitor to 20mg  q day.  Pt aware and agrees to change. Will follow lipid panel.   I have ordered f/u labs.  Needs physical in 3 months and fasting labs 1-2 days before physical.  It appears last visit, he left without it being scheduled.  Thank you.

## 2020-01-29 NOTE — Telephone Encounter (Signed)
Noted. Will put on lab schedule when he calls.

## 2020-01-31 ENCOUNTER — Ambulatory Visit (INDEPENDENT_AMBULATORY_CARE_PROVIDER_SITE_OTHER): Payer: BC Managed Care – PPO

## 2020-01-31 ENCOUNTER — Encounter (INDEPENDENT_AMBULATORY_CARE_PROVIDER_SITE_OTHER): Payer: BC Managed Care – PPO

## 2020-01-31 ENCOUNTER — Ambulatory Visit (INDEPENDENT_AMBULATORY_CARE_PROVIDER_SITE_OTHER): Payer: BC Managed Care – PPO | Admitting: Nurse Practitioner

## 2020-01-31 ENCOUNTER — Other Ambulatory Visit: Payer: Self-pay

## 2020-01-31 ENCOUNTER — Ambulatory Visit (INDEPENDENT_AMBULATORY_CARE_PROVIDER_SITE_OTHER): Payer: BC Managed Care – PPO | Admitting: Vascular Surgery

## 2020-01-31 ENCOUNTER — Other Ambulatory Visit: Payer: BC Managed Care – PPO

## 2020-01-31 DIAGNOSIS — R0989 Other specified symptoms and signs involving the circulatory and respiratory systems: Secondary | ICD-10-CM | POA: Diagnosis not present

## 2020-01-31 DIAGNOSIS — E7800 Pure hypercholesterolemia, unspecified: Secondary | ICD-10-CM

## 2020-01-31 DIAGNOSIS — R300 Dysuria: Secondary | ICD-10-CM

## 2020-01-31 DIAGNOSIS — Z0289 Encounter for other administrative examinations: Secondary | ICD-10-CM

## 2020-01-31 DIAGNOSIS — I739 Peripheral vascular disease, unspecified: Secondary | ICD-10-CM | POA: Diagnosis not present

## 2020-01-31 DIAGNOSIS — I10 Essential (primary) hypertension: Secondary | ICD-10-CM

## 2020-01-31 DIAGNOSIS — R739 Hyperglycemia, unspecified: Secondary | ICD-10-CM

## 2020-01-31 NOTE — Addendum Note (Signed)
Addended by: Leeanne Rio on: 01/31/2020 02:40 PM   Modules accepted: Orders

## 2020-02-01 LAB — URINALYSIS, ROUTINE W REFLEX MICROSCOPIC
Bilirubin, UA: NEGATIVE
Glucose, UA: NEGATIVE
Ketones, UA: NEGATIVE
Leukocytes,UA: NEGATIVE
Nitrite, UA: NEGATIVE
Protein,UA: NEGATIVE
RBC, UA: NEGATIVE
Specific Gravity, UA: 1.022 (ref 1.005–1.030)
Urobilinogen, Ur: 0.2 mg/dL (ref 0.2–1.0)
pH, UA: 6 (ref 5.0–7.5)

## 2020-02-03 LAB — URINE CULTURE: Organism ID, Bacteria: NO GROWTH

## 2020-02-07 ENCOUNTER — Other Ambulatory Visit: Payer: Self-pay | Admitting: Internal Medicine

## 2020-02-07 ENCOUNTER — Encounter (INDEPENDENT_AMBULATORY_CARE_PROVIDER_SITE_OTHER): Payer: Self-pay | Admitting: *Deleted

## 2020-02-27 ENCOUNTER — Other Ambulatory Visit: Payer: Self-pay | Admitting: Internal Medicine

## 2020-03-12 ENCOUNTER — Other Ambulatory Visit: Payer: Self-pay | Admitting: Internal Medicine

## 2020-03-19 DIAGNOSIS — L568 Other specified acute skin changes due to ultraviolet radiation: Secondary | ICD-10-CM | POA: Diagnosis not present

## 2020-03-19 DIAGNOSIS — C44329 Squamous cell carcinoma of skin of other parts of face: Secondary | ICD-10-CM | POA: Diagnosis not present

## 2020-03-19 DIAGNOSIS — L570 Actinic keratosis: Secondary | ICD-10-CM | POA: Diagnosis not present

## 2020-03-19 DIAGNOSIS — Z85828 Personal history of other malignant neoplasm of skin: Secondary | ICD-10-CM | POA: Diagnosis not present

## 2020-04-14 ENCOUNTER — Other Ambulatory Visit: Payer: Self-pay | Admitting: Internal Medicine

## 2020-04-21 ENCOUNTER — Other Ambulatory Visit: Payer: Self-pay

## 2020-04-21 ENCOUNTER — Other Ambulatory Visit (INDEPENDENT_AMBULATORY_CARE_PROVIDER_SITE_OTHER): Payer: BC Managed Care – PPO

## 2020-04-21 DIAGNOSIS — E7800 Pure hypercholesterolemia, unspecified: Secondary | ICD-10-CM

## 2020-04-21 DIAGNOSIS — R739 Hyperglycemia, unspecified: Secondary | ICD-10-CM

## 2020-04-21 DIAGNOSIS — I10 Essential (primary) hypertension: Secondary | ICD-10-CM

## 2020-04-21 LAB — HEPATIC FUNCTION PANEL
ALT: 13 U/L (ref 0–53)
AST: 17 U/L (ref 0–37)
Albumin: 4.6 g/dL (ref 3.5–5.2)
Alkaline Phosphatase: 55 U/L (ref 39–117)
Bilirubin, Direct: 0.1 mg/dL (ref 0.0–0.3)
Total Bilirubin: 0.7 mg/dL (ref 0.2–1.2)
Total Protein: 6.9 g/dL (ref 6.0–8.3)

## 2020-04-21 LAB — BASIC METABOLIC PANEL
BUN: 21 mg/dL (ref 6–23)
CO2: 30 mEq/L (ref 19–32)
Calcium: 9.6 mg/dL (ref 8.4–10.5)
Chloride: 102 mEq/L (ref 96–112)
Creatinine, Ser: 1.06 mg/dL (ref 0.40–1.50)
GFR: 74.98 mL/min (ref >60.00)
Glucose, Bld: 112 mg/dL — ABNORMAL HIGH (ref 70–99)
Potassium: 4.4 mEq/L (ref 3.5–5.1)
Sodium: 140 mEq/L (ref 135–145)

## 2020-04-21 LAB — LIPID PANEL
Cholesterol: 162 mg/dL (ref 0–200)
HDL: 31.3 mg/dL — ABNORMAL LOW (ref >39.00)
NonHDL: 130.8
Total CHOL/HDL Ratio: 5
Triglycerides: 251 mg/dL — ABNORMAL HIGH (ref 0.0–149.0)
VLDL: 50.2 mg/dL — ABNORMAL HIGH (ref 0.0–40.0)

## 2020-04-21 LAB — LDL CHOLESTEROL, DIRECT: Direct LDL: 76 mg/dL

## 2020-04-21 LAB — HEMOGLOBIN A1C: Hgb A1c MFr Bld: 5.6 % (ref 4.6–6.5)

## 2020-04-24 ENCOUNTER — Other Ambulatory Visit: Payer: BC Managed Care – PPO

## 2020-04-28 ENCOUNTER — Encounter: Payer: Self-pay | Admitting: Internal Medicine

## 2020-04-28 ENCOUNTER — Ambulatory Visit (INDEPENDENT_AMBULATORY_CARE_PROVIDER_SITE_OTHER): Payer: BC Managed Care – PPO | Admitting: Internal Medicine

## 2020-04-28 ENCOUNTER — Other Ambulatory Visit: Payer: Self-pay

## 2020-04-28 VITALS — BP 118/70 | HR 76 | Temp 98.5°F | Ht 70.0 in | Wt 172.5 lb

## 2020-04-28 DIAGNOSIS — Z0000 Encounter for general adult medical examination without abnormal findings: Secondary | ICD-10-CM

## 2020-04-28 DIAGNOSIS — I10 Essential (primary) hypertension: Secondary | ICD-10-CM

## 2020-04-28 DIAGNOSIS — F439 Reaction to severe stress, unspecified: Secondary | ICD-10-CM | POA: Diagnosis not present

## 2020-04-28 DIAGNOSIS — N411 Chronic prostatitis: Secondary | ICD-10-CM

## 2020-04-28 DIAGNOSIS — E7800 Pure hypercholesterolemia, unspecified: Secondary | ICD-10-CM

## 2020-04-28 DIAGNOSIS — H6121 Impacted cerumen, right ear: Secondary | ICD-10-CM

## 2020-04-28 DIAGNOSIS — D649 Anemia, unspecified: Secondary | ICD-10-CM

## 2020-04-28 DIAGNOSIS — I700 Atherosclerosis of aorta: Secondary | ICD-10-CM

## 2020-04-28 DIAGNOSIS — Z720 Tobacco use: Secondary | ICD-10-CM | POA: Diagnosis not present

## 2020-04-28 DIAGNOSIS — R739 Hyperglycemia, unspecified: Secondary | ICD-10-CM

## 2020-04-28 DIAGNOSIS — M5442 Lumbago with sciatica, left side: Secondary | ICD-10-CM

## 2020-04-28 DIAGNOSIS — I739 Peripheral vascular disease, unspecified: Secondary | ICD-10-CM | POA: Diagnosis not present

## 2020-04-28 DIAGNOSIS — K219 Gastro-esophageal reflux disease without esophagitis: Secondary | ICD-10-CM

## 2020-04-28 MED ORDER — AMITRIPTYLINE HCL 25 MG PO TABS: 25.0000 mg | ORAL_TABLET | Freq: Every day | ORAL | 5 refills | Status: DC

## 2020-04-28 MED ORDER — CARBAMIDE PEROXIDE 6.5 % OT SOLN: 5.0000 [drp] | Freq: Every day | OTIC | 0 refills | Status: DC

## 2020-04-28 MED ORDER — PANTOPRAZOLE SODIUM 40 MG PO TBEC: 1.0000 | DELAYED_RELEASE_TABLET | Freq: Every day | ORAL | 1 refills | Status: DC

## 2020-04-28 NOTE — Progress Notes (Signed)
Patient ID: Travis Gray, male   DOB: 05-Jun-1955, 65 y.o.   MRN: [id number]   Subjective:    Patient ID: Travis Gray, male    DOB: 01/30/1955, 65 y.o.   MRN: [id number]  HPI This visit occurred during the SARS-CoV-2 public health emergency.  Safety protocols were in place, including screening questions prior to the visit, additional usage of staff PPE, and extensive cleaning of exam room while observing appropriate contact time as indicated for disinfecting solutions.  Patient here for his physical exam.  He is doing relatively well.  Still with back/buttock/leg pain. Has seen Dr Sharlet Salina.  S/p ESI with good results.  Still with pain.  Walking worse.  Limits activity.  Discussed f/u with Dr Sharlet Salina.  No chest pain or sob reported.  No cough or congestion.  No abdominal pain.  Bowels moving.  Still with occasional discomfort - pubic area.  Pain not as intense as previously.  No burning with urination.  No blood.  Urinating ok.  Has f/u planned with urology.  Handling stress. Doing well on cymbalta.   Past Medical History:  Diagnosis Date  . Chronic headaches    migraines, 3-4x/yr  . Degenerative disc disease, cervical   . Genetic testing 03/20/2017   Multi-Cancer panel (83 genes) @ [facility] - No pathogenic mutations detected  . GERD (gastroesophageal reflux disease)   . Hypercholesterolemia   . Hypertension   . Obesity   . Peripheral vascular disease ([place])    groin and right leg stents  . Sleep apnea    CPAP  . Tobacco abuse    Past Surgical History:  Procedure Laterality Date  . CATARACT EXTRACTION W/PHACO Left 04/11/2018   Procedure: CATARACT EXTRACTION PHACO AND INTRAOCULAR LENS PLACEMENT ([place])  COMPLICATED LEFT;  Surgeon: Leandrew Koyanagi, MD;  Location: [facility];  Service: Ophthalmology;  Laterality: Left;  OMIDRIA sleep apnea  . COLONOSCOPY WITH PROPOFOL N/A 11/25/2016   Procedure: COLONOSCOPY WITH PROPOFOL;  Surgeon: Toledo, [name], MD;  Location: [facility]  ENDOSCOPY;  Service: Endoscopy;  Laterality: N/A;  . ESOPHAGOGASTRODUODENOSCOPY (EGD) WITH PROPOFOL N/A 11/25/2016   Procedure: ESOPHAGOGASTRODUODENOSCOPY (EGD) WITH PROPOFOL;  Surgeon: Toledo, [name], MD;  Location: [facility] ENDOSCOPY;  Service: Endoscopy;  Laterality: N/A;  . HERNIA REPAIR     Inguinal,laparoscopic surgery  . PERIPHERAL VASCULAR CATHETERIZATION Right 05/21/2015   Procedure: Lower Extremity Angiography;  Surgeon: Algernon Huxley, MD;  Location: [facility];  Service: Cardiovascular;  Laterality: Right;  . PERIPHERAL VASCULAR CATHETERIZATION  05/21/2015   Procedure: Lower Extremity Intervention;  Surgeon: Algernon Huxley, MD;  Location: [facility];  Service: Cardiovascular;;  . radio-ablated therapy     completed in the pain clinic   Family History  Problem Relation Age of Onset  . Hypertension Mother   . Breast cancer Mother 52  . Uterine cancer Mother        unconfirmed; dx 56s; TAH/BSO; currently 57  . Hyperlipidemia Father   . Bladder Cancer Paternal Grandfather 30       smoker; deceased 52  . Diabetes Maternal Grandmother   . Non-Hodgkin's lymphoma Paternal Aunt        deceased 31   Social History   Socioeconomic History  . Marital status: Married    Spouse name: Not on file  . Number of children: Not on file  . Years of education: Not on file  . Highest education level: Not on file  Occupational History  . Not on  file  Tobacco Use  . Smoking status: Former Smoker    Packs/day: 1.00    Years: 37.00    Pack years: 37.00    Types: Cigarettes    Quit date: 05/25/2015    Years since quitting: 4.9  . Smokeless tobacco: Never Used  Vaping Use  . Vaping Use: Never used  Substance and Sexual Activity  . Alcohol use: Yes    Alcohol/week: 0.0 standard drinks    Comment: very occasional alcohol use - 3-4x/yr  . Drug use: No  . Sexual activity: Not on file  Other Topics Concern  . Not on file  Social History Narrative  . Not on file   Social  Determinants of Health   Financial Resource Strain: Not on file  Food Insecurity: Not on file  Transportation Needs: Not on file  Physical Activity: Not on file  Stress: Not on file  Social Connections: Not on file    Outpatient Encounter Medications as of 04/28/2020  Medication Sig  . atorvastatin (LIPITOR) 20 MG tablet Take 1 tablet (20 mg total) by mouth daily.  . carbamide peroxide (DEBROX) 6.5 % OTIC solution Place 5 drops into the right ear daily.  . clopidogrel (PLAVIX) 75 MG tablet TAKE 1 TABLET BY MOUTH ONCE DAILY  . DULoxetine (CYMBALTA) 30 MG capsule TAKE 1 CAPSULE BY MOUTH ONCE DAILY  . hydrochlorothiazide (HYDRODIURIL) 25 MG tablet TAKE 1 TABLET BY MOUTH ONCE DAILY  . losartan (COZAAR) 100 MG tablet TAKE 1 TABLET BY MOUTH ONCE DAILY  . metoprolol succinate (TOPROL-XL) 50 MG 24 hr tablet TAKE 1 TABLET BY MOUTH ONCE DAILY  . omega-3 acid ethyl esters (LOVAZA) 1 g capsule Take 1 capsule (1 g total) by mouth daily.  . tamsulosin (FLOMAX) 0.4 MG CAPS capsule Take 2 capsules (0.8 mg total) by mouth daily.  . vitamin B-12 (CYANOCOBALAMIN) 1000 MCG tablet Take 1,000 mcg by mouth daily.  . [DISCONTINUED] amitriptyline (ELAVIL) 25 MG tablet Take 1 tablet (25 mg total) by mouth at bedtime.  . [DISCONTINUED] pantoprazole (PROTONIX) 40 MG tablet TAKE 1 TABLET BY MOUTH ONCE DAILY  . amitriptyline (ELAVIL) 25 MG tablet Take 1 tablet (25 mg total) by mouth at bedtime.  . pantoprazole (PROTONIX) 40 MG tablet Take 1 tablet (40 mg total) by mouth daily.   No facility-administered encounter medications on file as of 04/28/2020.    Review of Systems  Constitutional: Negative for appetite change and unexpected weight change.  HENT: Negative for congestion, sinus pressure and sore throat.   Eyes: Negative for pain and visual disturbance.  Respiratory: Negative for cough, chest tightness and shortness of breath.   Cardiovascular: Negative for chest pain, palpitations and leg swelling.   Gastrointestinal: Negative for abdominal pain, diarrhea, nausea and vomiting.  Genitourinary: Negative for difficulty urinating and dysuria.  Musculoskeletal: Positive for back pain. Negative for joint swelling and myalgias.  Skin: Negative for color change and rash.  Neurological: Negative for dizziness, light-headedness and headaches.  Hematological: Negative for adenopathy. Does not bruise/bleed easily.  Psychiatric/Behavioral: Negative for agitation and dysphoric mood.       Objective:    Physical Exam Vitals reviewed.  Constitutional:      General: He is not in acute distress.    Appearance: Normal appearance. He is well-developed.  HENT:     Head: Normocephalic and atraumatic.     Right Ear: External ear normal. There is impacted cerumen.     Left Ear: External ear normal. There is no impacted cerumen.  Eyes:     General: No scleral icterus.       Right eye: No discharge.        Left eye: No discharge.     Conjunctiva/sclera: Conjunctivae normal.  Neck:     Thyroid: No thyromegaly.  Cardiovascular:     Rate and Rhythm: Normal rate and regular rhythm.  Pulmonary:     Effort: No respiratory distress.     Breath sounds: Normal breath sounds. No wheezing.  Abdominal:     General: Bowel sounds are normal.     Palpations: Abdomen is soft.     Tenderness: There is no abdominal tenderness.  Musculoskeletal:        General: No swelling or tenderness.     Cervical back: Neck supple. No tenderness.  Lymphadenopathy:     Cervical: No cervical adenopathy.  Skin:    Findings: No erythema or rash.  Neurological:     Mental Status: He is alert and oriented to person, place, and time.  Psychiatric:        Mood and Affect: Mood normal.        Behavior: Behavior normal.     BP 118/70   Pulse 76   Temp 98.5 F (36.9 C)   Ht 5' 10"  (1.778 m)   Wt 172 lb 8 oz (78.2 kg)   SpO2 96%   BMI 24.75 kg/m  Wt Readings from Last 3 Encounters:  04/28/20 172 lb 8 oz (78.2 kg)   01/10/20 172 lb (78 kg)  12/12/19 175 lb (79.4 kg)     Lab Results  Component Value Date   WBC 5.9 01/09/2020   HGB 14.2 01/09/2020   HCT 40.8 01/09/2020   PLT 191.0 01/09/2020   GLUCOSE 112 (H) 04/21/2020   CHOL 162 04/21/2020   TRIG 251.0 (H) 04/21/2020   HDL 31.30 (L) 04/21/2020   LDLDIRECT 76.0 04/21/2020   LDLCALC 60 04/24/2013   ALT 13 04/21/2020   AST 17 04/21/2020   NA 140 04/21/2020   K 4.4 04/21/2020   CL 102 04/21/2020   CREATININE 1.06 04/21/2020   BUN 21 04/21/2020   CO2 30 04/21/2020   TSH 3.38 04/17/2019   PSA 1.84 01/09/2020   INR 1.09 05/24/2015   HGBA1C 5.6 04/21/2020    CT CHEST LUNG CANCER SCREENING LOW DOSE WO CONTRAST  Result Date: 12/12/2019 CLINICAL DATA:  65 year old male with 37 pack-year history of smoking. Lung cancer screening. EXAM: CT CHEST WITHOUT CONTRAST LOW-DOSE FOR LUNG CANCER SCREENING TECHNIQUE: Multidetector CT imaging of the chest was performed following the standard protocol without IV contrast. COMPARISON:  10/10/2018 FINDINGS: Cardiovascular: The heart size is normal. No substantial pericardial effusion. No thoracic aortic aneurysm. Mediastinum/Nodes: No mediastinal lymphadenopathy. No evidence for gross hilar lymphadenopathy although assessment is limited by the lack of intravenous contrast on today's study. The esophagus has normal imaging features. There is no axillary lymphadenopathy. Lungs/Pleura: Centrilobular and paraseptal emphysema evident. Previously identified pulmonary nodules are stable. Scattered tiny bilateral calcified and noncalcified pulmonary nodules again noted, stable in the interval. No new suspicious pulmonary nodule or mass. No focal airspace consolidation. No pleural effusion. Upper Abdomen: Unremarkable Musculoskeletal: No worrisome lytic or sclerotic osseous abnormality. IMPRESSION: 1. Lung-RADS 2, benign appearance or behavior. Continue annual screening with low-dose chest CT without contrast in 12 months. 2.  Emphysema (ICD10-J43.9). Electronically Signed   By: Misty Stanley [name]   On: 12/12/2019 11:25       Assessment & Plan:   Problem List Items  Addressed This Visit    Anemia    Follow cbc.       Aortic atherosclerosis (HCC)    Continue lipitor.       Back pain    Persistent.  Has seen [facility] and Dr Sharlet Salina.  S/p ESI.  Persistent pain limiting activity.  F/u with Dr Sharlet Salina.        Cerumen impaction    Cerumen impaction - right.  Debrox.  Will notify if desires ear irrigation.       Chronic prostatitis    Followed by urology. On flomax.  Symptoms improved - as outlined.  Has f/u with urology.        Essential hypertension, benign    Pressure as outlined.  Doing well.  Continue losartan and hctz.  Follow pressures.  Follow metabolic panel.  No changes.       Relevant Orders   Basic metabolic panel   GERD (gastroesophageal reflux disease)    No upper symptoms reported.  On protonix.       Relevant Medications   pantoprazole (PROTONIX) 40 MG tablet   Health care maintenance    Physical today 04/28/20.  Colonoscopy 11/2016 - moderate diverticulosis, 30m polyp rectum and non bleeding internal hemorrhoids.  PSA 01/09/20 - 1.84.        Hypercholesterolemia    Continue lipitor.  Low cholesterol diet and exercise as tolerated.  Improved.  Lab Results  Component Value Date   CHOL 162 04/21/2020   HDL 31.30 (L) 04/21/2020   LDLCALC 60 04/24/2013   LDLDIRECT 76.0 04/21/2020   TRIG 251.0 (H) 04/21/2020   CHOLHDL 5 04/21/2020        Relevant Orders   TSH   Lipid panel   Hepatic function panel   Hyperglycemia    Low carb diet and exercise.  Follow met b and a1c.       Relevant Orders   Hemoglobin A1c   Peripheral vascular disease (HCC)    S/p iliac stents.  Followed by [facility].  Continue plavix and lipitor.  Follow.       Stress    Overall appears to be handling things well.  Continue cymbalta.  Follow.       Tobacco abuse    Smoked a few cigarettes recently.  Off  now.  Continue to remain off.  Follow.        Other Visit Diagnoses    Routine general medical examination at a health care facility    -  Primary       CEinar Pheasant MD

## 2020-05-03 ENCOUNTER — Encounter: Payer: Self-pay | Admitting: Internal Medicine

## 2020-05-03 DIAGNOSIS — H6120 Impacted cerumen, unspecified ear: Secondary | ICD-10-CM | POA: Insufficient documentation

## 2020-05-03 NOTE — Assessment & Plan Note (Signed)
Pressure as outlined.  Doing well.  Continue losartan and hctz.  Follow pressures.  Follow metabolic panel.  No changes.  

## 2020-05-03 NOTE — Assessment & Plan Note (Signed)
No upper symptoms reported.  On protonix.   

## 2020-05-03 NOTE — Assessment & Plan Note (Signed)
Persistent.  Has seen [facility] and Dr Sharlet Salina.  S/p ESI.  Persistent pain limiting activity.  F/u with Dr Sharlet Salina.

## 2020-05-03 NOTE — Assessment & Plan Note (Signed)
Continue lipitor.  Low cholesterol diet and exercise as tolerated.  Improved.  Lab Results  Component Value Date   CHOL 162 04/21/2020   HDL 31.30 (L) 04/21/2020   LDLCALC 60 04/24/2013   LDLDIRECT 76.0 04/21/2020   TRIG 251.0 (H) 04/21/2020   CHOLHDL 5 04/21/2020

## 2020-05-03 NOTE — Assessment & Plan Note (Signed)
Low carb diet and exercise.  Follow met b and a1c.  

## 2020-05-03 NOTE — Assessment & Plan Note (Signed)
S/p iliac stents.  Followed by [facility].  Continue plavix and lipitor.  Follow.

## 2020-05-03 NOTE — Assessment & Plan Note (Signed)
Continue lipitor  ?

## 2020-05-03 NOTE — Assessment & Plan Note (Signed)
Smoked a few cigarettes recently.  Off now.  Continue to remain off.  Follow.

## 2020-05-03 NOTE — Assessment & Plan Note (Signed)
Follow cbc.  

## 2020-05-03 NOTE — Assessment & Plan Note (Signed)
Followed by urology. On flomax.  Symptoms improved - as outlined.  Has f/u with urology.

## 2020-05-03 NOTE — Assessment & Plan Note (Signed)
Overall appears to be handling things well.  Continue cymbalta.  Follow.

## 2020-05-03 NOTE — Assessment & Plan Note (Signed)
Physical today 04/28/20.  Colonoscopy 11/2016 - moderate diverticulosis, 10mm polyp rectum and non bleeding internal hemorrhoids.  PSA 01/09/20 - 1.84.

## 2020-05-03 NOTE — Assessment & Plan Note (Signed)
Cerumen impaction - right.  Debrox.  Will notify if desires ear irrigation.

## 2020-05-06 ENCOUNTER — Other Ambulatory Visit: Payer: Self-pay | Admitting: Internal Medicine

## 2020-05-11 ENCOUNTER — Other Ambulatory Visit: Payer: Self-pay

## 2020-05-11 ENCOUNTER — Ambulatory Visit: Payer: BC Managed Care – PPO | Admitting: Urology

## 2020-05-11 ENCOUNTER — Encounter: Payer: Self-pay | Admitting: Urology

## 2020-05-11 VITALS — BP 153/91 | HR 73 | Ht 70.0 in | Wt 175.0 lb

## 2020-05-11 DIAGNOSIS — R350 Frequency of micturition: Secondary | ICD-10-CM

## 2020-05-11 DIAGNOSIS — R39198 Other difficulties with micturition: Secondary | ICD-10-CM | POA: Diagnosis not present

## 2020-05-11 DIAGNOSIS — N411 Chronic prostatitis: Secondary | ICD-10-CM

## 2020-05-11 MED ORDER — TAMSULOSIN HCL 0.4 MG PO CAPS: 0.8000 mg | ORAL_CAPSULE | Freq: Every day | ORAL | 3 refills | Status: AC

## 2020-05-11 NOTE — Progress Notes (Signed)
05/11/2020 8:34 AM   Brita Romp Tish Frederickson 04/03/1955 [id number]  Referring provider: [name], MD [address] [place],  [place] [address]  Chief Complaint  Patient presents with  . Prostatitis    HPI: I was consulted to assess the patient's discomfort with urination and frequency. He actually has pain in the penis when he voids but otherwise not. He is voiding every 30 to 60 minutes and has difficulty holding it for 2 hours. Since starting CPAP he rarely gets up once a night.He has no pain with ejaculation  His flow was poor. Sometimes he hesitates sometimes he stops and starts. Sometimes he strains.He does feel empty but 10 minutes later can double void a varying amount  Patient has flow symptoms and pain with urination. He has increased frequency. His residual today was 262 mL. Urine sent for culture. I will first treat him for prostatitis. Pathophysiology and treatment discussed. I called in Mobic and ciprofloxacin. It turns out he started Flomax 3 weeks ago and his flow is a bit better and he says he has less pain and I will keep him on this. Ciprofloxacin 500 mg and Mobic 15 mg sent.  Still has discomfort in the penis when he urinates. Residual today 192 mL  He understands it in the future he likely will need urodynamics. I want to rule out a stricture or any bladder issues causing discomfort prior  Ultrasound showed thickening of bladder wall and normal kidneys. I distant culture was negative  Patient reported that the Flomax 0.8 mg does help the flow but he still feels discomfort in the penis but it is less.   Cystoscopy: normal  Patient has obstructive voiding symptoms and discomfort with voiding. He understands the concept of prostatitis. He does have elevated residuals. Certainly surgery may not reach his treatment goal in the sense of discomfort and he could up regulate it. The role of UDS described in the more complex  presentation.Having said that there is objective elevated residuals that certainly could justify a prostate directed therapy  Patient understands very well that surgery may not reach his goal but he is actually doing quite acceptable on the Flomax 0.8 mg. We will continue with conservative treatment.  Frequency stable.  Flow stable.  Mild discomfort stable.  No bladder infections.  No flareups  Patient is able to get an erection but not able to penetrate.  He does not take nitroglycerin.   Flomax 0.8 mg for 30 days 11 refills sent.  I gave him sildenafil 20 mg tablets that he will titrate from 1-5 as needed.  30 tablets and 1 refill given.  I will see him in 1 year.  Make additional appointment if not satisfied on sildenafil  Today Flow stable.  Frequency stable.  Mild discomfort comes and goes and feels like he needs to urinate really badly but it is relieved when he urinates.  2 weeks ago he had some perineal or rectal discomfort that went away on its own.  50 g benign prostate  Only tried sildenafil once   PMH: Past Medical History:  Diagnosis Date  . Chronic headaches    migraines, 3-4x/yr  . Degenerative disc disease, cervical   . Genetic testing 03/20/2017   Multi-Cancer panel (83 genes) @ [facility] - No pathogenic mutations detected  . GERD (gastroesophageal reflux disease)   . Hypercholesterolemia   . Hypertension   . Obesity   . Peripheral vascular disease (HCC)    groin and right leg  stents  . Sleep apnea    CPAP  . Tobacco abuse     Surgical History: Past Surgical History:  Procedure Laterality Date  . CATARACT EXTRACTION W/PHACO Left 04/11/2018   Procedure: CATARACT EXTRACTION PHACO AND INTRAOCULAR LENS PLACEMENT ([place])  COMPLICATED LEFT;  Surgeon: Leandrew Koyanagi, MD;  Location: [facility];  Service: Ophthalmology;  Laterality: Left;  OMIDRIA sleep apnea  . COLONOSCOPY WITH PROPOFOL N/A 11/25/2016   Procedure: COLONOSCOPY WITH PROPOFOL;   Surgeon: Toledo, [name], MD;  Location: [facility];  Service: Endoscopy;  Laterality: N/A;  . ESOPHAGOGASTRODUODENOSCOPY (EGD) WITH PROPOFOL N/A 11/25/2016   Procedure: ESOPHAGOGASTRODUODENOSCOPY (EGD) WITH PROPOFOL;  Surgeon: Toledo, [name], MD;  Location: [facility];  Service: Endoscopy;  Laterality: N/A;  . HERNIA REPAIR     Inguinal,laparoscopic surgery  . PERIPHERAL VASCULAR CATHETERIZATION Right 05/21/2015   Procedure: Lower Extremity Angiography;  Surgeon: Algernon Huxley, MD;  Location: [facility];  Service: Cardiovascular;  Laterality: Right;  . PERIPHERAL VASCULAR CATHETERIZATION  05/21/2015   Procedure: Lower Extremity Intervention;  Surgeon: Algernon Huxley, MD;  Location: [facility];  Service: Cardiovascular;;  . radio-ablated therapy     completed in the pain clinic    Home Medications:  Allergies as of 05/11/2020   No Known Allergies     Medication List       Accurate as of May 11, 2020  8:34 AM. If you have any questions, ask your nurse or doctor.        amitriptyline 25 MG tablet Commonly known as: ELAVIL Take 1 tablet (25 mg total) by mouth at bedtime.   atorvastatin 20 MG tablet Commonly known as: LIPITOR TAKE 1 TABLET BY MOUTH ONCE DAILY   carbamide peroxide 6.5 % OTIC solution Commonly known as: DEBROX Place 5 drops into the right ear daily.   clopidogrel 75 MG tablet Commonly known as: PLAVIX TAKE 1 TABLET BY MOUTH ONCE DAILY   DULoxetine 30 MG capsule Commonly known as: CYMBALTA TAKE 1 CAPSULE BY MOUTH ONCE DAILY   hydrochlorothiazide 25 MG tablet Commonly known as: HYDRODIURIL TAKE 1 TABLET BY MOUTH ONCE DAILY   losartan 100 MG tablet Commonly known as: COZAAR TAKE 1 TABLET BY MOUTH ONCE DAILY   metoprolol succinate 50 MG 24 hr tablet Commonly known as: TOPROL-XL TAKE 1 TABLET BY MOUTH ONCE DAILY   omega-3 acid ethyl esters 1 g capsule Commonly known as: LOVAZA Take 1 capsule (1 g total) by mouth daily.    pantoprazole 40 MG tablet Commonly known as: PROTONIX Take 1 tablet (40 mg total) by mouth daily.   tamsulosin 0.4 MG Caps capsule Commonly known as: FLOMAX Take 2 capsules (0.8 mg total) by mouth daily.   vitamin B-12 1000 MCG tablet Commonly known as: CYANOCOBALAMIN Take 1,000 mcg by mouth daily.       Allergies: No Known Allergies  Family History: Family History  Problem Relation Age of Onset  . Hypertension Mother   . Breast cancer Mother 33  . Uterine cancer Mother        unconfirmed; dx 30s; TAH/BSO; currently 83  . Hyperlipidemia Father   . Bladder Cancer Paternal Grandfather 58       smoker; deceased 56  . Diabetes Maternal Grandmother   . Non-Hodgkin's lymphoma Paternal Aunt        deceased 67    Social History:  reports that he quit smoking about 4 years ago. His smoking use included cigarettes. He has a  37.00 pack-year smoking history. He has never used smokeless tobacco. He reports current alcohol use. He reports that he does not use drugs.  ROS:                                        Physical Exam: There were no vitals taken for this visit.   Laboratory Data: Lab Results  Component Value Date   WBC 5.9 01/09/2020   HGB 14.2 01/09/2020   HCT 40.8 01/09/2020   MCV 93.0 01/09/2020   PLT 191.0 01/09/2020    Lab Results  Component Value Date   CREATININE 1.06 04/21/2020    Lab Results  Component Value Date   PSA 1.84 01/09/2020   PSA 1.51 11/16/2018   PSA 1.74 11/07/2017    No results found for: TESTOSTERONE  Lab Results  Component Value Date   HGBA1C 5.6 04/21/2020    Urinalysis    Component Value Date/Time   COLORURINE YELLOW 08/22/2016 1600   APPEARANCEUR Clear 01/31/2020 1440   LABSPEC 1.015 08/22/2016 1600   LABSPEC 1.025 05/21/2012 1456   PHURINE 6.0 08/22/2016 1600   GLUCOSEU Negative 01/31/2020 1440   GLUCOSEU NEGATIVE 08/22/2016 1600   HGBUR NEGATIVE 08/22/2016 1600   BILIRUBINUR Negative  01/31/2020 1440   BILIRUBINUR Negative 05/21/2012 1456   KETONESUR NEGATIVE 08/22/2016 1600   PROTEINUR Negative 01/31/2020 1440   PROTEINUR Negative 05/21/2012 1456   UROBILINOGEN 0.2 01/09/2018 1049   UROBILINOGEN 0.2 08/22/2016 1600   NITRITE Negative 01/31/2020 1440   NITRITE NEGATIVE 08/22/2016 1600   LEUKOCYTESUR Negative 01/31/2020 1440   LEUKOCYTESUR Negative 05/21/2012 1456    Pertinent Imaging:   Assessment & Plan: 3 months with 3 refills of Flomax 0.8 mg sent and I will see in 1 year  There are no diagnoses linked to this encounter.  No follow-ups on file.  Reece Packer, MD  [facility] [address], [place], [place] [address] [phone]

## 2020-05-19 ENCOUNTER — Other Ambulatory Visit: Payer: Self-pay | Admitting: Internal Medicine

## 2020-05-26 ENCOUNTER — Other Ambulatory Visit: Payer: Self-pay | Admitting: Internal Medicine

## 2020-07-01 ENCOUNTER — Other Ambulatory Visit: Payer: Self-pay | Admitting: Internal Medicine

## 2020-07-01 ENCOUNTER — Other Ambulatory Visit (INDEPENDENT_AMBULATORY_CARE_PROVIDER_SITE_OTHER): Payer: Self-pay | Admitting: Nurse Practitioner

## 2020-08-07 ENCOUNTER — Encounter: Payer: Self-pay | Admitting: Internal Medicine

## 2020-08-07 NOTE — Telephone Encounter (Signed)
Called and  spoke with patient and retrieved number of call [phone]. Advised I was having IT look into the matter , but not to give any private information out.

## 2020-08-07 NOTE — Telephone Encounter (Signed)
Thank you :)

## 2020-08-07 NOTE — Telephone Encounter (Signed)
I have not made a referral to [facility].  See his message.

## 2020-08-18 ENCOUNTER — Other Ambulatory Visit: Payer: Self-pay | Admitting: Internal Medicine

## 2020-08-26 ENCOUNTER — Other Ambulatory Visit: Payer: BC Managed Care – PPO

## 2020-08-28 ENCOUNTER — Ambulatory Visit: Payer: BC Managed Care – PPO | Admitting: Internal Medicine

## 2020-09-01 ENCOUNTER — Encounter: Payer: Self-pay | Admitting: Internal Medicine

## 2020-09-01 NOTE — Telephone Encounter (Signed)
Please call Mr Travis Gray and inform him - Since I will not be here end of this week or beginning of next week, I would like for him to go ahead and be evaluated - as outlined above.  We can f/u with him after - just need to confirm nothing more acute going on.  I can work him in when I return on 09/08/20.

## 2020-09-01 NOTE — Telephone Encounter (Signed)
Please confirm if any acute issues - weakness, chest pain, sob, etc.  Given that he is having numbness and given change in symptoms, I do recommend evaluation to confirm nothing more acute going on. Since I will not be here end of this week or beginning of next week, I would like for him to go ahead and be evaluated - as outlined above.  We can f/u with him after - just need to confirm nothing more acute going on.

## 2020-09-01 NOTE — Telephone Encounter (Signed)
Placed call to pt. Pt states the numbness in his right arm started about 3 or 4 weeks ago. Pt states it started with a tight muscle in his right shoulder which eventually went away then started to feel numbness about 3 or 4 days later. Pt states he's had bad shoulder pain for a couple of years. Pt states a stent was put in a couple of years ago. Pt states he can get a good pulse on his wrist and is getting good circulation.

## 2020-09-02 NOTE — Telephone Encounter (Signed)
Patient aware of below and says he will go to an acute care this PM and then f/u with PCP after.

## 2020-09-08 ENCOUNTER — Encounter: Payer: Self-pay | Admitting: Internal Medicine

## 2020-09-09 NOTE — Telephone Encounter (Signed)
Please advise 

## 2020-09-09 NOTE — Telephone Encounter (Signed)
Spoke with patient. Advised that referral will take a few days to process. Gave info about [facility]. He is going to go over there and f/u after.

## 2020-09-09 NOTE — Telephone Encounter (Signed)
Please call.  Inform, I am ok with referring - let me know if has preference.  If he is in that much pain, there is a ortho walk in.

## 2020-09-11 DIAGNOSIS — M5412 Radiculopathy, cervical region: Secondary | ICD-10-CM | POA: Diagnosis not present

## 2020-09-23 DIAGNOSIS — M25511 Pain in right shoulder: Secondary | ICD-10-CM | POA: Diagnosis not present

## 2020-09-23 DIAGNOSIS — M545 Low back pain, unspecified: Secondary | ICD-10-CM | POA: Diagnosis not present

## 2020-10-13 DIAGNOSIS — M25511 Pain in right shoulder: Secondary | ICD-10-CM | POA: Diagnosis not present

## 2020-10-13 DIAGNOSIS — M5459 Other low back pain: Secondary | ICD-10-CM | POA: Diagnosis not present

## 2020-10-13 DIAGNOSIS — M5412 Radiculopathy, cervical region: Secondary | ICD-10-CM | POA: Diagnosis not present

## 2020-10-15 ENCOUNTER — Other Ambulatory Visit: Payer: Self-pay | Admitting: Internal Medicine

## 2020-10-15 DIAGNOSIS — R072 Precordial pain: Secondary | ICD-10-CM | POA: Diagnosis not present

## 2020-10-15 DIAGNOSIS — R001 Bradycardia, unspecified: Secondary | ICD-10-CM | POA: Diagnosis not present

## 2020-10-15 DIAGNOSIS — R0602 Shortness of breath: Secondary | ICD-10-CM | POA: Diagnosis not present

## 2020-10-15 DIAGNOSIS — R0600 Dyspnea, unspecified: Secondary | ICD-10-CM | POA: Diagnosis not present

## 2020-10-15 DIAGNOSIS — R110 Nausea: Secondary | ICD-10-CM | POA: Diagnosis not present

## 2020-10-15 DIAGNOSIS — E785 Hyperlipidemia, unspecified: Secondary | ICD-10-CM | POA: Diagnosis not present

## 2020-10-15 DIAGNOSIS — R079 Chest pain, unspecified: Secondary | ICD-10-CM | POA: Diagnosis not present

## 2020-10-15 DIAGNOSIS — J439 Emphysema, unspecified: Secondary | ICD-10-CM | POA: Diagnosis not present

## 2020-10-15 DIAGNOSIS — I10 Essential (primary) hypertension: Secondary | ICD-10-CM | POA: Diagnosis not present

## 2020-10-16 DIAGNOSIS — R079 Chest pain, unspecified: Secondary | ICD-10-CM | POA: Diagnosis not present

## 2020-10-16 DIAGNOSIS — J439 Emphysema, unspecified: Secondary | ICD-10-CM | POA: Diagnosis not present

## 2020-10-16 DIAGNOSIS — R072 Precordial pain: Secondary | ICD-10-CM | POA: Diagnosis not present

## 2020-10-16 DIAGNOSIS — R0600 Dyspnea, unspecified: Secondary | ICD-10-CM | POA: Diagnosis not present

## 2020-10-29 ENCOUNTER — Other Ambulatory Visit (INDEPENDENT_AMBULATORY_CARE_PROVIDER_SITE_OTHER): Payer: BC Managed Care – PPO

## 2020-10-29 ENCOUNTER — Other Ambulatory Visit: Payer: Self-pay

## 2020-10-29 DIAGNOSIS — E7800 Pure hypercholesterolemia, unspecified: Secondary | ICD-10-CM | POA: Diagnosis not present

## 2020-10-29 DIAGNOSIS — I10 Essential (primary) hypertension: Secondary | ICD-10-CM

## 2020-10-29 DIAGNOSIS — R739 Hyperglycemia, unspecified: Secondary | ICD-10-CM

## 2020-10-29 LAB — HEMOGLOBIN A1C: Hgb A1c MFr Bld: 5.7 % (ref 4.6–6.5)

## 2020-10-29 LAB — HEPATIC FUNCTION PANEL
ALT: 14 U/L (ref 0–53)
AST: 15 U/L (ref 0–37)
Albumin: 4.3 g/dL (ref 3.5–5.2)
Alkaline Phosphatase: 52 U/L (ref 39–117)
Bilirubin, Direct: 0.1 mg/dL (ref 0.0–0.3)
Total Bilirubin: 0.7 mg/dL (ref 0.2–1.2)
Total Protein: 6.4 g/dL (ref 6.0–8.3)

## 2020-10-29 LAB — LIPID PANEL
Cholesterol: 152 mg/dL (ref 0–200)
HDL: 29.2 mg/dL — ABNORMAL LOW (ref >39.00)
NonHDL: 122.77
Total CHOL/HDL Ratio: 5
Triglycerides: 277 mg/dL — ABNORMAL HIGH (ref 0.0–149.0)
VLDL: 55.4 mg/dL — ABNORMAL HIGH (ref 0.0–40.0)

## 2020-10-29 LAB — BASIC METABOLIC PANEL
BUN: 15 mg/dL (ref 6–23)
CO2: 31 mEq/L (ref 19–32)
Calcium: 9.1 mg/dL (ref 8.4–10.5)
Chloride: 103 mEq/L (ref 96–112)
Creatinine, Ser: 1.1 mg/dL (ref 0.40–1.50)
GFR: 71.46 mL/min (ref >60.00)
Glucose, Bld: 101 mg/dL — ABNORMAL HIGH (ref 70–99)
Potassium: 3.9 mEq/L (ref 3.5–5.1)
Sodium: 142 mEq/L (ref 135–145)

## 2020-10-29 LAB — TSH: TSH: 3.69 u[IU]/mL (ref 0.35–5.50)

## 2020-10-29 LAB — LDL CHOLESTEROL, DIRECT: Direct LDL: 67 mg/dL

## 2020-11-02 ENCOUNTER — Telehealth: Payer: Self-pay | Admitting: Internal Medicine

## 2020-11-02 ENCOUNTER — Other Ambulatory Visit: Payer: Self-pay

## 2020-11-02 ENCOUNTER — Ambulatory Visit (INDEPENDENT_AMBULATORY_CARE_PROVIDER_SITE_OTHER): Payer: BC Managed Care – PPO | Admitting: Internal Medicine

## 2020-11-02 VITALS — BP 120/80 | HR 81 | Temp 97.6°F | Resp 16 | Ht 70.0 in | Wt 176.0 lb

## 2020-11-02 DIAGNOSIS — I10 Essential (primary) hypertension: Secondary | ICD-10-CM

## 2020-11-02 DIAGNOSIS — E7800 Pure hypercholesterolemia, unspecified: Secondary | ICD-10-CM

## 2020-11-02 DIAGNOSIS — Z720 Tobacco use: Secondary | ICD-10-CM

## 2020-11-02 DIAGNOSIS — I700 Atherosclerosis of aorta: Secondary | ICD-10-CM

## 2020-11-02 DIAGNOSIS — I739 Peripheral vascular disease, unspecified: Secondary | ICD-10-CM | POA: Diagnosis not present

## 2020-11-02 DIAGNOSIS — K219 Gastro-esophageal reflux disease without esophagitis: Secondary | ICD-10-CM | POA: Diagnosis not present

## 2020-11-02 DIAGNOSIS — Z860100 Personal history of colon polyps, unspecified: Secondary | ICD-10-CM

## 2020-11-02 DIAGNOSIS — R911 Solitary pulmonary nodule: Secondary | ICD-10-CM

## 2020-11-02 DIAGNOSIS — R079 Chest pain, unspecified: Secondary | ICD-10-CM

## 2020-11-02 DIAGNOSIS — Z125 Encounter for screening for malignant neoplasm of prostate: Secondary | ICD-10-CM

## 2020-11-02 DIAGNOSIS — R739 Hyperglycemia, unspecified: Secondary | ICD-10-CM

## 2020-11-02 DIAGNOSIS — M5442 Lumbago with sciatica, left side: Secondary | ICD-10-CM

## 2020-11-02 DIAGNOSIS — D649 Anemia, unspecified: Secondary | ICD-10-CM

## 2020-11-02 DIAGNOSIS — N411 Chronic prostatitis: Secondary | ICD-10-CM

## 2020-11-02 DIAGNOSIS — R001 Bradycardia, unspecified: Secondary | ICD-10-CM

## 2020-11-02 NOTE — Progress Notes (Signed)
Patient ID: Travis Gray, male   DOB: 08/14/1955, 65 y.o.   MRN: [id number]   Subjective:    Patient ID: Travis Gray, male    DOB: Jun 02, 1955, 65 y.o.   MRN: [id number]  This visit occurred during the SARS-CoV-2 public health emergency.  Safety protocols were in place, including screening questions prior to the visit, additional usage of staff PPE, and extensive cleaning of exam room while observing appropriate contact time as indicated for disinfecting solutions.   Patient here for a scheduled follow up.   Chief Complaint  Patient presents with   Hyperglycemia   Hypertension   Hyperlipidemia   .   HPI Was recently evaluated in ER for chest pain. Had noticed some intermittent chest pain/sob - the week before evaluated.  Notes reviewed.  CXR - negative. CTA - no pulmonary emboli or acute finding in the chest.  Lab work unrevealing.  While in ER, noted to have some intermittent low heart rate - low 50s.  ER recommended f/u with cardiology for further w/up and evaluation.  Since his visit, he has had no further chest pain. Tries to stay active.  Back limits activity.  Does have risk factors.  Agreeable to cardiology evaluation for further evaluation and w/up.  Persistent neck, shoulder and arm pain.  Injection did help some.  F/u  with Renee Harder - 11/04/20.  Eating.  No acid reflux reported.  No abdominal pain.  Bowels moving.  Does report some persistent increased urinary frequency and hurting with urination.  Has seen urology.  Flomax has helped some.  Plans to f/u with urology.     Past Medical History:  Diagnosis Date   Chronic headaches    migraines, 3-4x/yr   Degenerative disc disease, cervical    Genetic testing 03/20/2017   Multi-Cancer panel (83 genes) @ [facility] - No pathogenic mutations detected   GERD (gastroesophageal reflux disease)    Hypercholesterolemia    Hypertension    Obesity    Peripheral vascular disease (HCC)    groin and right leg stents   Sleep  apnea    CPAP   Tobacco abuse    Past Surgical History:  Procedure Laterality Date   CATARACT EXTRACTION W/PHACO Left 04/11/2018   Procedure: CATARACT EXTRACTION PHACO AND INTRAOCULAR LENS PLACEMENT ([place])  COMPLICATED LEFT;  Surgeon: Leandrew Koyanagi, MD;  Location: [facility];  Service: Ophthalmology;  Laterality: Left;  OMIDRIA sleep apnea   COLONOSCOPY WITH PROPOFOL N/A 11/25/2016   Procedure: COLONOSCOPY WITH PROPOFOL;  Surgeon: Toledo, [name], MD;  Location: [facility];  Service: Endoscopy;  Laterality: N/A;   ESOPHAGOGASTRODUODENOSCOPY (EGD) WITH PROPOFOL N/A 11/25/2016   Procedure: ESOPHAGOGASTRODUODENOSCOPY (EGD) WITH PROPOFOL;  Surgeon: Toledo, [name], MD;  Location: [facility];  Service: Endoscopy;  Laterality: N/A;   HERNIA REPAIR     Inguinal,laparoscopic surgery   PERIPHERAL VASCULAR CATHETERIZATION Right 05/21/2015   Procedure: Lower Extremity Angiography;  Surgeon: Algernon Huxley, MD;  Location: [facility];  Service: Cardiovascular;  Laterality: Right;   PERIPHERAL VASCULAR CATHETERIZATION  05/21/2015   Procedure: Lower Extremity Intervention;  Surgeon: Algernon Huxley, MD;  Location: [facility];  Service: Cardiovascular;;   radio-ablated therapy     completed in the pain clinic   Family History  Problem Relation Age of Onset   Hypertension Mother    Breast cancer Mother 45   Uterine cancer Mother        unconfirmed; dx 49s; TAH/BSO; currently 13  Hyperlipidemia Father    Bladder Cancer Paternal Grandfather 58       smoker; deceased 32   Diabetes Maternal Grandmother    Non-Hodgkin's lymphoma Paternal Aunt        deceased 95   Social History   Socioeconomic History   Marital status: Married    Spouse name: Not on file   Number of children: Not on file   Years of education: Not on file   Highest education level: Not on file  Occupational History   Not on file  Tobacco Use   Smoking status: Former    Packs/day: 1.00     Years: 37.00    Pack years: 37.00    Types: Cigarettes    Quit date: 05/25/2015    Years since quitting: 5.4   Smokeless tobacco: Never  Vaping Use   Vaping Use: Never used  Substance and Sexual Activity   Alcohol use: Yes    Alcohol/week: 0.0 standard drinks    Comment: very occasional alcohol use - 3-4x/yr   Drug use: No   Sexual activity: Not on file  Other Topics Concern   Not on file  Social History Narrative   Not on file   Social Determinants of Health   Financial Resource Strain: Not on file  Food Insecurity: Not on file  Transportation Needs: Not on file  Physical Activity: Not on file  Stress: Not on file  Social Connections: Not on file     Review of Systems  Constitutional:  Negative for appetite change and unexpected weight change.  HENT:  Negative for congestion and sinus pressure.   Respiratory:  Negative for cough, chest tightness and shortness of breath.   Cardiovascular:  Negative for chest pain and palpitations.  Gastrointestinal:  Negative for abdominal pain, diarrhea, nausea and vomiting.  Genitourinary:        Urinary frequency.  Pain with urination as outlined.    Musculoskeletal:  Positive for back pain and neck pain.  Skin:  Negative for color change and rash.  Neurological:  Negative for dizziness, light-headedness and headaches.  Psychiatric/Behavioral:  Negative for agitation and dysphoric mood.       Objective:     BP 120/80   Pulse 81   Temp 97.6 F (36.4 C)   Resp 16   Ht 5' 10"  (1.778 m)   Wt 176 lb (79.8 kg)   SpO2 99%   BMI 25.25 kg/m  Wt Readings from Last 3 Encounters:  11/02/20 176 lb (79.8 kg)  05/11/20 175 lb (79.4 kg)  04/28/20 172 lb 8 oz (78.2 kg)    Physical Exam Constitutional:      General: He is not in acute distress.    Appearance: Normal appearance. He is well-developed.  HENT:     Head: Normocephalic and atraumatic.     Right Ear: External ear normal.     Left Ear: External ear normal.  Eyes:      General: No scleral icterus.       Right eye: No discharge.        Left eye: No discharge.  Cardiovascular:     Rate and Rhythm: Normal rate and regular rhythm.  Pulmonary:     Effort: Pulmonary effort is normal. No respiratory distress.     Breath sounds: Normal breath sounds.  Abdominal:     General: Bowel sounds are normal.     Palpations: Abdomen is soft.     Tenderness: There is no abdominal tenderness.  Musculoskeletal:  General: No swelling or tenderness.     Cervical back: Neck supple. No tenderness.  Lymphadenopathy:     Cervical: No cervical adenopathy.  Skin:    Findings: No erythema or rash.  Neurological:     Mental Status: He is alert.  Psychiatric:        Mood and Affect: Mood normal.        Behavior: Behavior normal.     Outpatient Encounter Medications as of 11/02/2020  Medication Sig   amitriptyline (ELAVIL) 25 MG tablet TAKE 1 TABLET BY MOUTH AT BEDTIME   atorvastatin (LIPITOR) 20 MG tablet TAKE 1 TABLET BY MOUTH ONCE DAILY   carbamide peroxide (DEBROX) 6.5 % OTIC solution Place 5 drops into the right ear daily.   clopidogrel (PLAVIX) 75 MG tablet TAKE 1 TABLET BY MOUTH ONCE DAILY   DULoxetine (CYMBALTA) 30 MG capsule TAKE 1 CAPSULE BY MOUTH ONCE DAILY   hydrochlorothiazide (HYDRODIURIL) 25 MG tablet TAKE 1 TABLET BY MOUTH ONCE DAILY   losartan (COZAAR) 100 MG tablet TAKE 1 TABLET BY MOUTH ONCE DAILY   metoprolol succinate (TOPROL-XL) 50 MG 24 hr tablet TAKE 1 TABLET BY MOUTH ONCE DAILY   omega-3 acid ethyl esters (LOVAZA) 1 g capsule Take 1 capsule (1 g total) by mouth daily.   pantoprazole (PROTONIX) 40 MG tablet Take 1 tablet (40 mg total) by mouth daily.   vitamin B-12 (CYANOCOBALAMIN) 1000 MCG tablet Take 1,000 mcg by mouth daily.   No facility-administered encounter medications on file as of 11/02/2020.     Lab Results  Component Value Date   WBC 5.9 01/09/2020   HGB 14.2 01/09/2020   HCT 40.8 01/09/2020   PLT 191.0 01/09/2020   GLUCOSE  101 (H) 10/29/2020   CHOL 152 10/29/2020   TRIG 277.0 (H) 10/29/2020   HDL 29.20 (L) 10/29/2020   LDLDIRECT 67.0 10/29/2020   LDLCALC 60 04/24/2013   ALT 14 10/29/2020   AST 15 10/29/2020   NA 142 10/29/2020   K 3.9 10/29/2020   CL 103 10/29/2020   CREATININE 1.10 10/29/2020   BUN 15 10/29/2020   CO2 31 10/29/2020   TSH 3.69 10/29/2020   PSA 1.84 01/09/2020   INR 1.09 05/24/2015   HGBA1C 5.7 10/29/2020    CT CHEST LUNG CANCER SCREENING LOW DOSE WO CONTRAST  Result Date: 12/12/2019 CLINICAL DATA:  65 year old male with 37 pack-year history of smoking. Lung cancer screening. EXAM: CT CHEST WITHOUT CONTRAST LOW-DOSE FOR LUNG CANCER SCREENING TECHNIQUE: Multidetector CT imaging of the chest was performed following the standard protocol without IV contrast. COMPARISON:  10/10/2018 FINDINGS: Cardiovascular: The heart size is normal. No substantial pericardial effusion. No thoracic aortic aneurysm. Mediastinum/Nodes: No mediastinal lymphadenopathy. No evidence for gross hilar lymphadenopathy although assessment is limited by the lack of intravenous contrast on today's study. The esophagus has normal imaging features. There is no axillary lymphadenopathy. Lungs/Pleura: Centrilobular and paraseptal emphysema evident. Previously identified pulmonary nodules are stable. Scattered tiny bilateral calcified and noncalcified pulmonary nodules again noted, stable in the interval. No new suspicious pulmonary nodule or mass. No focal airspace consolidation. No pleural effusion. Upper Abdomen: Unremarkable Musculoskeletal: No worrisome lytic or sclerotic osseous abnormality. IMPRESSION: 1. Lung-RADS 2, benign appearance or behavior. Continue annual screening with low-dose chest CT without contrast in 12 months. 2. Emphysema (ICD10-J43.9). Electronically Signed   By: Misty Stanley [name]   On: 12/12/2019 11:25       Assessment & Plan:   Problem List Items Addressed This Visit  Anemia    Follow cbc.        Aortic atherosclerosis (HCC)    Continue lipitor.       Back pain    Persistent.  Has seen [facility] and Dr Sharlet Salina.  S/p ESI.         Bradycardia    Was noted in ER to have lower heart rate  - heart in the 50s.  He denies any dizziness or light headedness.  Will decrease metoprolol to 1/2 tablet per day.  Follow.  appt with cardiology as outlined.        Chest pain    Just evaluated in ER with w/up as outlined.  CTA - negative.  Labs unrevealing.  Given risk factors, discussed further cardiac w/up.  He is agreeable.  Refer to cardiology for further evaluation and w/up.        Relevant Orders   Ambulatory referral to Cardiology   Chronic prostatitis    Followed by urology. On flomax.  Symptoms improved, but still with symptoms.  Plans to f/u with urology.        Essential hypertension, benign    Pressure as outlined.  Doing well.  Continue losartan and hctz.  Follow pressures.  Follow metabolic panel.  No changes.       GERD (gastroesophageal reflux disease)    No upper symptoms reported.  On protonix.       History of colonic polyps    Colonoscopy 11/2016 - rectal polyp - tubular adenoma.  Recommended f/u colonoscopy in 5 years. (Dr Alice Reichert).       Hypercholesterolemia    Continue lipitor.  Low cholesterol diet and exercise as tolerated.  Improved.  Lab Results  Component Value Date   CHOL 152 10/29/2020   HDL 29.20 (L) 10/29/2020   LDLCALC 60 04/24/2013   LDLDIRECT 67.0 10/29/2020   TRIG 277.0 (H) 10/29/2020   CHOLHDL 5 10/29/2020       Hyperglycemia    Low carb diet and exercise.  Follow met b and a1c.       Lung nodule    .7cm ground glass nodule seen on recent CTA.  He has bee followed by yearly screening CT chest.  Follow.  Has stopped smoking.        Peripheral vascular disease ([place])    S/p iliac stents.  Followed by [facility].  Continue plavix and lipitor.  Follow.       Tobacco abuse    Off cigarettes now.  Discussed the need to remain off.  Follow.         Other Visit Diagnoses     Prostate cancer screening    -  Primary   Relevant Orders   PSA        [name], MD

## 2020-11-02 NOTE — Telephone Encounter (Signed)
Patient scheduled for labs 02/09/21 as requested by check out note.   Needing orders placed.

## 2020-11-02 NOTE — Patient Instructions (Signed)
Decrease metoprolol to 50mg  1/2 tablet per day.

## 2020-11-04 DIAGNOSIS — M19011 Primary osteoarthritis, right shoulder: Secondary | ICD-10-CM | POA: Diagnosis not present

## 2020-11-04 NOTE — Telephone Encounter (Signed)
Labs ordered.

## 2020-11-04 NOTE — Addendum Note (Signed)
Addended by: Lars Masson on: 11/04/2020 02:01 PM   Modules accepted: Orders

## 2020-11-07 ENCOUNTER — Encounter: Payer: Self-pay | Admitting: Internal Medicine

## 2020-11-07 DIAGNOSIS — R001 Bradycardia, unspecified: Secondary | ICD-10-CM | POA: Insufficient documentation

## 2020-11-07 DIAGNOSIS — R911 Solitary pulmonary nodule: Secondary | ICD-10-CM | POA: Insufficient documentation

## 2020-11-07 NOTE — Assessment & Plan Note (Signed)
Continue lipitor  ?

## 2020-11-07 NOTE — Assessment & Plan Note (Signed)
.  7cm ground glass nodule seen on recent CTA.  He has bee followed by yearly screening CT chest.  Follow.  Has stopped smoking.

## 2020-11-07 NOTE — Assessment & Plan Note (Signed)
Off cigarettes now.  Discussed the need to remain off.  Follow.

## 2020-11-07 NOTE — Assessment & Plan Note (Signed)
Just evaluated in ER with w/up as outlined.  CTA - negative.  Labs unrevealing.  Given risk factors, discussed further cardiac w/up.  He is agreeable.  Refer to cardiology for further evaluation and w/up.

## 2020-11-07 NOTE — Assessment & Plan Note (Signed)
Persistent.  Has seen [facility] and Dr Sharlet Salina.  S/p ESI.

## 2020-11-07 NOTE — Assessment & Plan Note (Signed)
S/p iliac stents.  Followed by [facility].  Continue plavix and lipitor.  Follow.

## 2020-11-07 NOTE — Assessment & Plan Note (Signed)
Follow cbc.  

## 2020-11-07 NOTE — Assessment & Plan Note (Signed)
Colonoscopy 11/2016 - rectal polyp - tubular adenoma.  Recommended f/u colonoscopy in 5 years. (Dr Alice Reichert).

## 2020-11-07 NOTE — Assessment & Plan Note (Signed)
Low carb diet and exercise.  Follow met b and a1c.  

## 2020-11-07 NOTE — Assessment & Plan Note (Signed)
No upper symptoms reported.  On protonix.   

## 2020-11-07 NOTE — Assessment & Plan Note (Signed)
Continue lipitor.  Low cholesterol diet and exercise as tolerated.  Improved.  Lab Results  Component Value Date   CHOL 152 10/29/2020   HDL 29.20 (L) 10/29/2020   LDLCALC 60 04/24/2013   LDLDIRECT 67.0 10/29/2020   TRIG 277.0 (H) 10/29/2020   CHOLHDL 5 10/29/2020

## 2020-11-07 NOTE — Assessment & Plan Note (Signed)
Followed by urology. On flomax.  Symptoms improved, but still with symptoms.  Plans to f/u with urology.

## 2020-11-07 NOTE — Assessment & Plan Note (Signed)
Was noted in ER to have lower heart rate  - heart in the 50s.  He denies any dizziness or light headedness.  Will decrease metoprolol to 1/2 tablet per day.  Follow.  appt with cardiology as outlined.

## 2020-11-07 NOTE — Assessment & Plan Note (Signed)
Pressure as outlined.  Doing well.  Continue losartan and hctz.  Follow pressures.  Follow metabolic panel.  No changes.  

## 2020-11-11 DIAGNOSIS — M5459 Other low back pain: Secondary | ICD-10-CM | POA: Diagnosis not present

## 2020-11-11 DIAGNOSIS — M25511 Pain in right shoulder: Secondary | ICD-10-CM | POA: Diagnosis not present

## 2020-11-11 DIAGNOSIS — M5412 Radiculopathy, cervical region: Secondary | ICD-10-CM | POA: Diagnosis not present

## 2020-11-14 ENCOUNTER — Other Ambulatory Visit: Payer: Self-pay | Admitting: Internal Medicine

## 2020-11-23 ENCOUNTER — Other Ambulatory Visit: Payer: Self-pay | Admitting: Internal Medicine

## 2020-11-27 DIAGNOSIS — H35372 Puckering of macula, left eye: Secondary | ICD-10-CM | POA: Diagnosis not present

## 2021-01-20 ENCOUNTER — Encounter: Payer: Self-pay | Admitting: Internal Medicine

## 2021-01-29 ENCOUNTER — Ambulatory Visit (INDEPENDENT_AMBULATORY_CARE_PROVIDER_SITE_OTHER): Payer: BC Managed Care – PPO | Admitting: Vascular Surgery

## 2021-01-29 ENCOUNTER — Encounter (INDEPENDENT_AMBULATORY_CARE_PROVIDER_SITE_OTHER): Payer: BC Managed Care – PPO

## 2021-02-01 ENCOUNTER — Other Ambulatory Visit (INDEPENDENT_AMBULATORY_CARE_PROVIDER_SITE_OTHER): Payer: Self-pay | Admitting: Nurse Practitioner

## 2021-02-01 DIAGNOSIS — Z9889 Other specified postprocedural states: Secondary | ICD-10-CM

## 2021-02-01 DIAGNOSIS — I739 Peripheral vascular disease, unspecified: Secondary | ICD-10-CM

## 2021-02-02 ENCOUNTER — Other Ambulatory Visit: Payer: Self-pay

## 2021-02-02 ENCOUNTER — Ambulatory Visit (INDEPENDENT_AMBULATORY_CARE_PROVIDER_SITE_OTHER): Payer: BC Managed Care – PPO | Admitting: Vascular Surgery

## 2021-02-02 ENCOUNTER — Ambulatory Visit (INDEPENDENT_AMBULATORY_CARE_PROVIDER_SITE_OTHER): Payer: BC Managed Care – PPO

## 2021-02-02 ENCOUNTER — Encounter (INDEPENDENT_AMBULATORY_CARE_PROVIDER_SITE_OTHER): Payer: Self-pay | Admitting: Vascular Surgery

## 2021-02-02 VITALS — BP 118/77 | HR 83 | Resp 16 | Ht 70.0 in | Wt 172.0 lb

## 2021-02-02 DIAGNOSIS — I739 Peripheral vascular disease, unspecified: Secondary | ICD-10-CM | POA: Diagnosis not present

## 2021-02-02 DIAGNOSIS — Z9889 Other specified postprocedural states: Secondary | ICD-10-CM | POA: Diagnosis not present

## 2021-02-02 DIAGNOSIS — I10 Essential (primary) hypertension: Secondary | ICD-10-CM | POA: Diagnosis not present

## 2021-02-02 DIAGNOSIS — E7800 Pure hypercholesterolemia, unspecified: Secondary | ICD-10-CM | POA: Diagnosis not present

## 2021-02-02 NOTE — Assessment & Plan Note (Signed)
ABIs today are 0.94 on the right and 0.96 on the left with multiphasic waveforms and normal digital pressures and waveforms bilaterally.  Overall doing well.  Legs really not bothering him much at this time.  We are over 5 years after intervention at this point.  Recheck on an annual basis.

## 2021-02-02 NOTE — Progress Notes (Signed)
MRN : [id number]  Travis Gray is a 66 y.o. ([birth year]) male who presents with chief complaint of  Chief Complaint  Patient presents with   Follow-up    ultrasound  .  History of Present Illness: Patient returns today in follow up of his PAD.  5 to 6 years ago, he underwent aortoiliac intervention for disabling claudication symptoms.  He has done much better since that time.  His legs really are not bothering him that much currently.  No rest pain.  No ulceration.  No disabling claudication symptoms.  Mild pain in his legs with activity. ABIs today are 0.94 on the right and 0.96 on the left with multiphasic waveforms and normal digital pressures and waveforms bilaterally.  Current Outpatient Medications  Medication Sig Dispense Refill   amitriptyline (ELAVIL) 25 MG tablet TAKE 1 TABLET BY MOUTH AT BEDTIME 30 tablet 5   atorvastatin (LIPITOR) 20 MG tablet TAKE 1 TABLET BY MOUTH AT BEDTIME 90 tablet 1   clopidogrel (PLAVIX) 75 MG tablet TAKE 1 TABLET BY MOUTH ONCE DAILY 30 tablet 11   DULoxetine (CYMBALTA) 30 MG capsule TAKE 1 CAPSULE BY MOUTH ONCE DAILY 90 capsule 1   hydrochlorothiazide (HYDRODIURIL) 25 MG tablet TAKE 1 TABLET BY MOUTH ONCE DAILY 90 tablet 1   losartan (COZAAR) 100 MG tablet TAKE 1 TABLET BY MOUTH ONCE DAILY 90 tablet 3   metoprolol succinate (TOPROL-XL) 50 MG 24 hr tablet TAKE 1 TABLET BY MOUTH ONCE DAILY 90 tablet 1   omega-3 acid ethyl esters (LOVAZA) 1 g capsule Take 1 capsule (1 g total) by mouth daily. 90 capsule 3   pantoprazole (PROTONIX) 40 MG tablet TAKE 1 TABLET BY MOUTH ONCE DAILY 90 tablet 1   tamsulosin (FLOMAX) 0.4 MG CAPS capsule Take 0.8 mg by mouth daily.     vitamin B-12 (CYANOCOBALAMIN) 1000 MCG tablet Take 1,000 mcg by mouth daily.     carbamide peroxide (DEBROX) 6.5 % OTIC solution Place 5 drops into the right ear daily. (Patient not taking: Reported on 02/02/2021) 15 mL 0   No current facility-administered medications for this visit.     Past Medical History:  Diagnosis Date   Chronic headaches    migraines, 3-4x/yr   Degenerative disc disease, cervical    Genetic testing 03/20/2017   Multi-Cancer panel (83 genes) @ [facility] - No pathogenic mutations detected   GERD (gastroesophageal reflux disease)    Hypercholesterolemia    Hypertension    Obesity    Peripheral vascular disease (HCC)    groin and right leg stents   Sleep apnea    CPAP   Tobacco abuse     Past Surgical History:  Procedure Laterality Date   CATARACT EXTRACTION W/PHACO Left 04/11/2018   Procedure: CATARACT EXTRACTION PHACO AND INTRAOCULAR LENS PLACEMENT ([place])  COMPLICATED LEFT;  Surgeon: Leandrew Koyanagi, MD;  Location: [facility];  Service: Ophthalmology;  Laterality: Left;  OMIDRIA sleep apnea   COLONOSCOPY WITH PROPOFOL N/A 11/25/2016   Procedure: COLONOSCOPY WITH PROPOFOL;  Surgeon: Toledo, [name], MD;  Location: [facility];  Service: Endoscopy;  Laterality: N/A;   ESOPHAGOGASTRODUODENOSCOPY (EGD) WITH PROPOFOL N/A 11/25/2016   Procedure: ESOPHAGOGASTRODUODENOSCOPY (EGD) WITH PROPOFOL;  Surgeon: Toledo, [name], MD;  Location: [facility];  Service: Endoscopy;  Laterality: N/A;   HERNIA REPAIR     Inguinal,laparoscopic surgery   PERIPHERAL VASCULAR CATHETERIZATION Right 05/21/2015   Procedure: Lower Extremity Angiography;  Surgeon: Algernon Huxley, MD;  Location: [facility];  Service: Cardiovascular;  Laterality: Right;   PERIPHERAL VASCULAR CATHETERIZATION  05/21/2015   Procedure: Lower Extremity Intervention;  Surgeon: Algernon Huxley, MD;  Location: [facility];  Service: Cardiovascular;;   radio-ablated therapy     completed in the pain clinic     Social History   Tobacco Use   Smoking status: Former    Packs/day: 1.00    Years: 37.00    Pack years: 37.00    Types: Cigarettes    Quit date: 05/25/2015    Years since quitting: 5.6   Smokeless tobacco: Never  Vaping Use   Vaping Use: Never used   Substance Use Topics   Alcohol use: Yes    Alcohol/week: 0.0 standard drinks    Comment: very occasional alcohol use - 3-4x/yr   Drug use: No       Family History  Problem Relation Age of Onset   Hypertension Mother    Breast cancer Mother 58   Uterine cancer Mother        unconfirmed; dx 10s; TAH/BSO; currently 4   Hyperlipidemia Father    Bladder Cancer Paternal Grandfather 88       smoker; deceased 18   Diabetes Maternal Grandmother    Non-Hodgkin's lymphoma Paternal Aunt        deceased 7     No Known Allergies   REVIEW OF SYSTEMS (Negative unless checked)  Constitutional: [] Weight loss  [] Fever  [] Chills Cardiac: [] Chest pain   [] Chest pressure   [] Palpitations   [] Shortness of breath when laying flat   [] Shortness of breath at rest   [] Shortness of breath with exertion. Vascular:  [x] Pain in legs with walking   [] Pain in legs at rest   [] Pain in legs when laying flat   [] Claudication   [] Pain in feet when walking  [] Pain in feet at rest  [] Pain in feet when laying flat   [] History of DVT   [] Phlebitis   [] Swelling in legs   [] Varicose veins   [] Non-healing ulcers Pulmonary:   [] Uses home oxygen   [] Productive cough   [] Hemoptysis   [] Wheeze  [] COPD   [] Asthma Neurologic:  [] Dizziness  [] Blackouts   [] Seizures   [] History of stroke   [] History of TIA  [] Aphasia   [] Temporary blindness   [] Dysphagia   [] Weakness or numbness in arms   [] Weakness or numbness in legs Musculoskeletal:  [x] Arthritis   [] Joint swelling   [] Joint pain   [] Low back pain Hematologic:  [] Easy bruising  [] Easy bleeding   [] Hypercoagulable state   [] Anemic   Gastrointestinal:  [] Blood in stool   [] Vomiting blood  [] Gastroesophageal reflux/heartburn   [] Abdominal pain Genitourinary:  [] Chronic kidney disease   [] Difficult urination  [] Frequent urination  [] Burning with urination   [] Hematuria Skin:  [] Rashes   [] Ulcers   [] Wounds Psychological:  [] History of anxiety   []  History of major  depression.  Physical Examination  BP 118/77 (BP Location: Left Arm)    Pulse 83    Resp 16    Ht 5\' 10"  (1.778 m)    Wt 172 lb (78 kg)    BMI 24.68 kg/m  Gen:  WD/WN, NAD Head: [place]/AT, No temporalis wasting. Ear/Nose/Throat: Hearing grossly intact, nares w/o erythema or drainage Eyes: Conjunctiva clear. Sclera non-icteric Neck: Supple.  Trachea midline Pulmonary:  Good air movement, no use of accessory muscles.  Cardiac: RRR, no JVD Vascular:  Vessel Right Left  Radial Palpable Palpable  PT Palpable Palpable  DP Palpable Palpable   Gastrointestinal: soft, non-tender/non-distended. No guarding/reflex.  Musculoskeletal: M/S 5/5 throughout.  No deformity or atrophy.  No significant lower extremity edema. Neurologic: Sensation grossly intact in extremities.  Symmetrical.  Speech is fluent.  Psychiatric: Judgment intact, Mood & affect appropriate for pt's clinical situation. Dermatologic: No rashes or ulcers noted.  No cellulitis or open wounds.      Labs No results found for this or any previous visit (from the past 2160 hour(s)).  Radiology No results found.  Assessment/Plan Essential hypertension, benign blood pressure control important in reducing the progression of atherosclerotic disease. On appropriate oral medications.     Pure hypercholesterolemia lipid control important in reducing the progression of atherosclerotic disease. Continue statin therapy  Peripheral vascular disease (HCC) ABIs today are 0.94 on the right and 0.96 on the left with multiphasic waveforms and normal digital pressures and waveforms bilaterally.  Overall doing well.  Legs really not bothering him much at this time.  We are over 5 years after intervention at this point.  Recheck on an annual basis.    Leotis Pain, MD  02/02/2021 4:00 PM    This note was created with Dragon medical transcription system.  Any errors from dictation are purely unintentional

## 2021-02-09 ENCOUNTER — Other Ambulatory Visit (INDEPENDENT_AMBULATORY_CARE_PROVIDER_SITE_OTHER): Payer: BC Managed Care – PPO

## 2021-02-09 ENCOUNTER — Other Ambulatory Visit: Payer: Self-pay

## 2021-02-09 DIAGNOSIS — E7800 Pure hypercholesterolemia, unspecified: Secondary | ICD-10-CM | POA: Diagnosis not present

## 2021-02-09 DIAGNOSIS — I10 Essential (primary) hypertension: Secondary | ICD-10-CM

## 2021-02-09 DIAGNOSIS — Z125 Encounter for screening for malignant neoplasm of prostate: Secondary | ICD-10-CM | POA: Diagnosis not present

## 2021-02-09 DIAGNOSIS — R739 Hyperglycemia, unspecified: Secondary | ICD-10-CM | POA: Diagnosis not present

## 2021-02-09 LAB — HEMOGLOBIN A1C: Hgb A1c MFr Bld: 5.8 % (ref 4.6–6.5)

## 2021-02-09 LAB — LIPID PANEL
Cholesterol: 148 mg/dL (ref 0–200)
HDL: 27.7 mg/dL — ABNORMAL LOW (ref >39.00)
NonHDL: 120.45
Total CHOL/HDL Ratio: 5
Triglycerides: 259 mg/dL — ABNORMAL HIGH (ref 0.0–149.0)
VLDL: 51.8 mg/dL — ABNORMAL HIGH (ref 0.0–40.0)

## 2021-02-09 LAB — HEPATIC FUNCTION PANEL
ALT: 12 U/L (ref 0–53)
AST: 14 U/L (ref 0–37)
Albumin: 4.4 g/dL (ref 3.5–5.2)
Alkaline Phosphatase: 54 U/L (ref 39–117)
Bilirubin, Direct: 0.1 mg/dL (ref 0.0–0.3)
Total Bilirubin: 0.5 mg/dL (ref 0.2–1.2)
Total Protein: 6.6 g/dL (ref 6.0–8.3)

## 2021-02-09 LAB — BASIC METABOLIC PANEL
BUN: 20 mg/dL (ref 6–23)
CO2: 30 mEq/L (ref 19–32)
Calcium: 9.2 mg/dL (ref 8.4–10.5)
Chloride: 102 mEq/L (ref 96–112)
Creatinine, Ser: 1.13 mg/dL (ref 0.40–1.50)
GFR: 69.05 mL/min (ref >60.00)
Glucose, Bld: 108 mg/dL — ABNORMAL HIGH (ref 70–99)
Potassium: 3.8 mEq/L (ref 3.5–5.1)
Sodium: 140 mEq/L (ref 135–145)

## 2021-02-09 LAB — PSA: PSA: 1.74 ng/mL (ref 0.10–4.00)

## 2021-02-09 LAB — LDL CHOLESTEROL, DIRECT: Direct LDL: 70 mg/dL

## 2021-02-10 ENCOUNTER — Ambulatory Visit (INDEPENDENT_AMBULATORY_CARE_PROVIDER_SITE_OTHER): Payer: BC Managed Care – PPO

## 2021-02-10 ENCOUNTER — Encounter: Payer: Self-pay | Admitting: Internal Medicine

## 2021-02-10 ENCOUNTER — Ambulatory Visit (INDEPENDENT_AMBULATORY_CARE_PROVIDER_SITE_OTHER): Payer: BC Managed Care – PPO | Admitting: Internal Medicine

## 2021-02-10 DIAGNOSIS — M5442 Lumbago with sciatica, left side: Secondary | ICD-10-CM

## 2021-02-10 DIAGNOSIS — M545 Low back pain, unspecified: Secondary | ICD-10-CM

## 2021-02-10 DIAGNOSIS — I10 Essential (primary) hypertension: Secondary | ICD-10-CM

## 2021-02-10 DIAGNOSIS — I739 Peripheral vascular disease, unspecified: Secondary | ICD-10-CM | POA: Diagnosis not present

## 2021-02-10 DIAGNOSIS — E7800 Pure hypercholesterolemia, unspecified: Secondary | ICD-10-CM

## 2021-02-10 DIAGNOSIS — I700 Atherosclerosis of aorta: Secondary | ICD-10-CM

## 2021-02-10 DIAGNOSIS — R739 Hyperglycemia, unspecified: Secondary | ICD-10-CM

## 2021-02-10 DIAGNOSIS — R1032 Left lower quadrant pain: Secondary | ICD-10-CM

## 2021-02-10 DIAGNOSIS — Z86010 Personal history of colonic polyps: Secondary | ICD-10-CM

## 2021-02-10 DIAGNOSIS — R911 Solitary pulmonary nodule: Secondary | ICD-10-CM

## 2021-02-10 DIAGNOSIS — N411 Chronic prostatitis: Secondary | ICD-10-CM

## 2021-02-10 DIAGNOSIS — K219 Gastro-esophageal reflux disease without esophagitis: Secondary | ICD-10-CM

## 2021-02-10 LAB — URINALYSIS, ROUTINE W REFLEX MICROSCOPIC
Bilirubin Urine: NEGATIVE
Hgb urine dipstick: NEGATIVE
Ketones, ur: NEGATIVE
Leukocytes,Ua: NEGATIVE
Nitrite: NEGATIVE
RBC / HPF: NONE SEEN (ref 0–?)
Specific Gravity, Urine: 1.025 (ref 1.000–1.030)
Total Protein, Urine: NEGATIVE
Urine Glucose: NEGATIVE
Urobilinogen, UA: 0.2 (ref 0.0–1.0)
pH: 6 (ref 5.0–8.0)

## 2021-02-10 NOTE — Progress Notes (Signed)
Patient ID: Jachai Okazaki, male   DOB: 02/15/1955, 66 y.o.   MRN: [id number]   Subjective:    Patient ID: Lamarco Gudiel, male    DOB: 03-25-1955, 66 y.o.   MRN: [id number]  This visit occurred during the SARS-CoV-2 public health emergency.  Safety protocols were in place, including screening questions prior to the visit, additional usage of staff PPE, and extensive cleaning of exam room while observing appropriate contact time as indicated for disinfecting solutions.   Patient here for a scheduled follow up.   Marland Kitchen   HPI Here to follow up regarding his blood pressure and cholesterol.  Main concern today is left lower back pain and pain that occasionally radiates to his lower abdomen.  Left lower back pain - present for the last 1-2 months.  (Feels different than the right side pain).  Certain movements aggravate.  Sometimes feels is worsened with urination.  Taking tylenol at night.  Helps.  Walking and standing - aggravates his right side pain.  Walking does not appear to aggravate the left side.  No hematuria.  Just saw Dr Lucky Cowboy.  Everything checked out ok.  Recommended f/u in one year.  States otherwise doing ok.  No chest pain or sob reported.  No acid reflux reported.  No other abdominal pain reported. Bowels moving.    Past Medical History:  Diagnosis Date   Chronic headaches    migraines, 3-4x/yr   Degenerative disc disease, cervical    Genetic testing 03/20/2017   Multi-Cancer panel (83 genes) @ [facility] - No pathogenic mutations detected   GERD (gastroesophageal reflux disease)    Hypercholesterolemia    Hypertension    Obesity    Peripheral vascular disease (HCC)    groin and right leg stents   Sleep apnea    CPAP   Tobacco abuse    Past Surgical History:  Procedure Laterality Date   CATARACT EXTRACTION W/PHACO Left 04/11/2018   Procedure: CATARACT EXTRACTION PHACO AND INTRAOCULAR LENS PLACEMENT ([place])  COMPLICATED LEFT;  Surgeon: Leandrew Koyanagi, MD;  Location: [facility];  Service: Ophthalmology;  Laterality: Left;  OMIDRIA sleep apnea   COLONOSCOPY WITH PROPOFOL N/A 11/25/2016   Procedure: COLONOSCOPY WITH PROPOFOL;  Surgeon: Toledo, [name], MD;  Location: [facility];  Service: Endoscopy;  Laterality: N/A;   ESOPHAGOGASTRODUODENOSCOPY (EGD) WITH PROPOFOL N/A 11/25/2016   Procedure: ESOPHAGOGASTRODUODENOSCOPY (EGD) WITH PROPOFOL;  Surgeon: Toledo, [name], MD;  Location: [facility];  Service: Endoscopy;  Laterality: N/A;   HERNIA REPAIR     Inguinal,laparoscopic surgery   PERIPHERAL VASCULAR CATHETERIZATION Right 05/21/2015   Procedure: Lower Extremity Angiography;  Surgeon: Algernon Huxley, MD;  Location: [facility];  Service: Cardiovascular;  Laterality: Right;   PERIPHERAL VASCULAR CATHETERIZATION  05/21/2015   Procedure: Lower Extremity Intervention;  Surgeon: Algernon Huxley, MD;  Location: [facility];  Service: Cardiovascular;;   radio-ablated therapy     completed in the pain clinic   Family History  Problem Relation Age of Onset   Hypertension Mother    Breast cancer Mother 56   Uterine cancer Mother        unconfirmed; dx 2s; TAH/BSO; currently 65   Hyperlipidemia Father    Bladder Cancer Paternal Grandfather 16       smoker; deceased 10   Diabetes Maternal Grandmother    Non-Hodgkin's lymphoma Paternal Aunt        deceased 62   Social History   Socioeconomic History  Marital status: Married    Spouse name: Not on file   Number of children: Not on file   Years of education: Not on file   Highest education level: Not on file  Occupational History   Not on file  Tobacco Use   Smoking status: Former    Packs/day: 1.00    Years: 37.00    Pack years: 37.00    Types: Cigarettes    Quit date: 05/25/2015    Years since quitting: 5.7   Smokeless tobacco: Never  Vaping Use   Vaping Use: Never used  Substance and Sexual Activity   Alcohol use: Yes    Alcohol/week: 0.0 standard drinks    Comment: very  occasional alcohol use - 3-4x/yr   Drug use: No   Sexual activity: Not on file  Other Topics Concern   Not on file  Social History Narrative   Not on file   Social Determinants of Health   Financial Resource Strain: Not on file  Food Insecurity: Not on file  Transportation Needs: Not on file  Physical Activity: Not on file  Stress: Not on file  Social Connections: Not on file     Review of Systems  Constitutional:  Negative for appetite change and unexpected weight change.  HENT:  Negative for congestion and sinus pressure.   Respiratory:  Negative for cough, chest tightness and shortness of breath.   Cardiovascular:  Negative for chest pain, palpitations and leg swelling.  Gastrointestinal:  Negative for diarrhea, nausea and vomiting.       Occasional left lower quadrant pain - radiation from left side/back.   Genitourinary:  Negative for difficulty urinating.       Still will occasionally notice pain with urination that has been present for a while.  Seeing urology.   Musculoskeletal:  Positive for back pain. Negative for joint swelling and myalgias.  Skin:  Negative for color change and rash.  Neurological:  Negative for dizziness, light-headedness and headaches.  Psychiatric/Behavioral:  Negative for agitation and dysphoric mood.       Objective:     BP 128/78 (BP Location: Left Arm, Patient Position: Sitting, Cuff Size: Normal)    Pulse 62    Temp 98 F (36.7 C) (Oral)    Ht 5' 10" (1.778 m)    Wt 173 lb 3.2 oz (78.6 kg)    BMI 24.85 kg/m  Wt Readings from Last 3 Encounters:  02/10/21 173 lb 3.2 oz (78.6 kg)  02/02/21 172 lb (78 kg)  11/02/20 176 lb (79.8 kg)    Physical Exam Constitutional:      General: He is not in acute distress.    Appearance: Normal appearance. He is well-developed.  HENT:     Head: Normocephalic and atraumatic.     Right Ear: External ear normal.     Left Ear: External ear normal.  Eyes:     General: No scleral icterus.       Right  eye: No discharge.        Left eye: No discharge.  Cardiovascular:     Rate and Rhythm: Normal rate and regular rhythm.  Pulmonary:     Effort: Pulmonary effort is normal. No respiratory distress.     Breath sounds: Normal breath sounds.  Abdominal:     General: Bowel sounds are normal.     Palpations: Abdomen is soft.     Tenderness: There is no abdominal tenderness.  Musculoskeletal:        General:  No swelling or tenderness.     Cervical back: Neck supple. No tenderness.     Comments: Increased pain with SLR. Increased pain with position changes.  No CVA tenderness.   Lymphadenopathy:     Cervical: No cervical adenopathy.  Skin:    Findings: No erythema or rash.  Neurological:     Mental Status: He is alert.  Psychiatric:        Mood and Affect: Mood normal.        Behavior: Behavior normal.     Outpatient Encounter Medications as of 02/10/2021  Medication Sig   amitriptyline (ELAVIL) 25 MG tablet TAKE 1 TABLET BY MOUTH AT BEDTIME   atorvastatin (LIPITOR) 20 MG tablet TAKE 1 TABLET BY MOUTH AT BEDTIME   clopidogrel (PLAVIX) 75 MG tablet TAKE 1 TABLET BY MOUTH ONCE DAILY   DULoxetine (CYMBALTA) 30 MG capsule TAKE 1 CAPSULE BY MOUTH ONCE DAILY   hydrochlorothiazide (HYDRODIURIL) 25 MG tablet TAKE 1 TABLET BY MOUTH ONCE DAILY   losartan (COZAAR) 100 MG tablet TAKE 1 TABLET BY MOUTH ONCE DAILY   metoprolol succinate (TOPROL-XL) 50 MG 24 hr tablet TAKE 1 TABLET BY MOUTH ONCE DAILY   omega-3 acid ethyl esters (LOVAZA) 1 g capsule Take 1 capsule (1 g total) by mouth daily.   pantoprazole (PROTONIX) 40 MG tablet TAKE 1 TABLET BY MOUTH ONCE DAILY   tamsulosin (FLOMAX) 0.4 MG CAPS capsule Take 0.8 mg by mouth daily.   vitamin B-12 (CYANOCOBALAMIN) 1000 MCG tablet Take 1,000 mcg by mouth daily.   [DISCONTINUED] carbamide peroxide (DEBROX) 6.5 % OTIC solution Place 5 drops into the right ear daily. (Patient not taking: Reported on 02/02/2021)   No facility-administered encounter  medications on file as of 02/10/2021.     Lab Results  Component Value Date   WBC 5.9 01/09/2020   HGB 14.2 01/09/2020   HCT 40.8 01/09/2020   PLT 191.0 01/09/2020   GLUCOSE 108 (H) 02/09/2021   CHOL 148 02/09/2021   TRIG 259.0 (H) 02/09/2021   HDL 27.70 (L) 02/09/2021   LDLDIRECT 70.0 02/09/2021   LDLCALC 60 04/24/2013   ALT 12 02/09/2021   AST 14 02/09/2021   NA 140 02/09/2021   K 3.8 02/09/2021   CL 102 02/09/2021   CREATININE 1.13 02/09/2021   BUN 20 02/09/2021   CO2 30 02/09/2021   TSH 3.69 10/29/2020   PSA 1.74 02/09/2021   INR 1.09 05/24/2015   HGBA1C 5.8 02/09/2021    CT CHEST LUNG CANCER SCREENING LOW DOSE WO CONTRAST  Result Date: 12/12/2019 CLINICAL DATA:  66 year old male with 37 pack-year history of smoking. Lung cancer screening. EXAM: CT CHEST WITHOUT CONTRAST LOW-DOSE FOR LUNG CANCER SCREENING TECHNIQUE: Multidetector CT imaging of the chest was performed following the standard protocol without IV contrast. COMPARISON:  10/10/2018 FINDINGS: Cardiovascular: The heart size is normal. No substantial pericardial effusion. No thoracic aortic aneurysm. Mediastinum/Nodes: No mediastinal lymphadenopathy. No evidence for gross hilar lymphadenopathy although assessment is limited by the lack of intravenous contrast on today's study. The esophagus has normal imaging features. There is no axillary lymphadenopathy. Lungs/Pleura: Centrilobular and paraseptal emphysema evident. Previously identified pulmonary nodules are stable. Scattered tiny bilateral calcified and noncalcified pulmonary nodules again noted, stable in the interval. No new suspicious pulmonary nodule or mass. No focal airspace consolidation. No pleural effusion. Upper Abdomen: Unremarkable Musculoskeletal: No worrisome lytic or sclerotic osseous abnormality. IMPRESSION: 1. Lung-RADS 2, benign appearance or behavior. Continue annual screening with low-dose chest CT without contrast in 12  months. 2. Emphysema  (ICD10-J43.9). Electronically Signed   By: Misty Stanley [name]   On: 12/12/2019 11:25       Assessment & Plan:   Problem List Items Addressed This Visit     Abdominal pain    With left lower quadrant pain.  Was questioning an underlying urological etiology.  Check KUB. Bowels moving.  F/u with urology. Check urinalysis.       Aortic atherosclerosis (HCC)    Continue lipitor.       Back pain    With left lower back pain and pain occasionally radiating to left lower quadrant.  Feels different from his right side pain.  Just had ABIs - ok.  Was questioning an underlying urological etiology.  Appears to be more msk in origin - aggravated by certain position changes and movements.  Check L-S spine xray.  Further w/up pending results.  Tylenol prn.        Relevant Orders   Urinalysis, Routine w reflex microscopic (Completed)   DG Lumbar Spine 2-3 Views (Completed)   DG Abd 1 View (Completed)   Chronic prostatitis    Has been followed by urology.  On flomax.  Symptoms persist with urination as outlined.  Plan f/u with urology.       Essential hypertension, benign    Pressure as outlined.  Doing well.  Continue losartan and hctz.  Follow pressures.  Follow metabolic panel.  No changes.       GERD (gastroesophageal reflux disease)    No upper symptoms reported.  On protonix.       History of colonic polyps    Colonoscopy 11/2016 - rectal polyp - tubular adenoma.  Recommended f/u colonoscopy in 5 years. (Dr Alice Reichert).       Hypercholesterolemia    Continue lipitor.  Low cholesterol diet and exercise as tolerated.  Improved.  Lab Results  Component Value Date   CHOL 148 02/09/2021   HDL 27.70 (L) 02/09/2021   LDLCALC 60 04/24/2013   LDLDIRECT 70.0 02/09/2021   TRIG 259.0 (H) 02/09/2021   CHOLHDL 5 02/09/2021       Hyperglycemia    Low carb diet and exercise.  Follow met b and a1c.       Low back pain   Relevant Orders   Urinalysis, Routine w reflex microscopic (Completed)    DG Lumbar Spine 2-3 Views (Completed)   DG Abd 1 View (Completed)   Lung nodule    .7cm ground glass nodule seen on recent CTA.  He has bee followed by yearly screening CT chest.  Follow.  Has stopped smoking.  Appears to be overdue f/u CT scan.  Need to schedule.       Peripheral vascular disease ([place])    Just evaluated by Dr Lucky Cowboy 02/02/21.  ABIs .94 right and .96 left - normal pressures and waveforms bilaterally.  Over 5 years s/p intervention.  Recommended continuing annual checks.          [name], MD

## 2021-02-14 ENCOUNTER — Telehealth: Payer: Self-pay | Admitting: Internal Medicine

## 2021-02-14 ENCOUNTER — Encounter: Payer: Self-pay | Admitting: Internal Medicine

## 2021-02-14 NOTE — Assessment & Plan Note (Signed)
Continue lipitor  ?

## 2021-02-14 NOTE — Assessment & Plan Note (Signed)
Just evaluated by Dr Lucky Cowboy 02/02/21.  ABIs .94 right and .96 left - normal pressures and waveforms bilaterally.  Over 5 years s/p intervention.  Recommended continuing annual checks.

## 2021-02-14 NOTE — Assessment & Plan Note (Signed)
Pressure as outlined.  Doing well.  Continue losartan and hctz.  Follow pressures.  Follow metabolic panel.  No changes.  

## 2021-02-14 NOTE — Assessment & Plan Note (Signed)
Has been followed by urology.  On flomax.  Symptoms persist with urination as outlined.  Plan f/u with urology.

## 2021-02-14 NOTE — Assessment & Plan Note (Signed)
No upper symptoms reported.  On protonix.   

## 2021-02-14 NOTE — Assessment & Plan Note (Signed)
With left lower quadrant pain.  Was questioning an underlying urological etiology.  Check KUB. Bowels moving.  F/u with urology. Check urinalysis.

## 2021-02-14 NOTE — Assessment & Plan Note (Signed)
Continue lipitor.  Low cholesterol diet and exercise as tolerated.  Improved.  Lab Results  Component Value Date   CHOL 148 02/09/2021   HDL 27.70 (L) 02/09/2021   LDLCALC 60 04/24/2013   LDLDIRECT 70.0 02/09/2021   TRIG 259.0 (H) 02/09/2021   CHOLHDL 5 02/09/2021

## 2021-02-14 NOTE — Assessment & Plan Note (Signed)
Low carb diet and exercise.  Follow met b and a1c.

## 2021-02-14 NOTE — Assessment & Plan Note (Addendum)
With left lower back pain and pain occasionally radiating to left lower quadrant.  Feels different from his right side pain.  Just had ABIs - ok.  Was questioning an underlying urological etiology.  Appears to be more msk in origin - aggravated by certain position changes and movements.  Check L-S spine xray.  Further w/up pending results.  Tylenol prn.

## 2021-02-14 NOTE — Assessment & Plan Note (Signed)
Colonoscopy 11/2016 - rectal polyp - tubular adenoma.  Recommended f/u colonoscopy in 5 years. (Dr Alice Reichert).

## 2021-02-14 NOTE — Assessment & Plan Note (Signed)
.  7cm ground glass nodule seen on recent CTA.  He has bee followed by yearly screening CT chest.  Follow.  Has stopped smoking.  Appears to be overdue f/u CT scan.  Need to schedule.

## 2021-02-14 NOTE — Telephone Encounter (Signed)
There is a result note (KUB xray and L-spine xray and urinalysis - all combined) that recommends referral to ortho.  He also sees urology (Dr Matilde Sprang) - [place].  He needs a f/u appt with Dr MadDiarmid - for persistent intermittent pain (lower abdominal pain) with urination.

## 2021-02-15 ENCOUNTER — Other Ambulatory Visit: Payer: Self-pay

## 2021-02-15 DIAGNOSIS — M545 Low back pain, unspecified: Secondary | ICD-10-CM

## 2021-02-16 ENCOUNTER — Other Ambulatory Visit: Payer: Self-pay | Admitting: Internal Medicine

## 2021-02-16 ENCOUNTER — Emergency Department
Admission: EM | Admit: 2021-02-16 | Discharge: 2021-02-16 | Disposition: A | Discharge status: Home / Self Care | Payer: BC Managed Care – PPO | Attending: Emergency Medicine | Admitting: Emergency Medicine

## 2021-02-16 ENCOUNTER — Emergency Department: Payer: BC Managed Care – PPO

## 2021-02-16 ENCOUNTER — Other Ambulatory Visit: Payer: Self-pay

## 2021-02-16 ENCOUNTER — Encounter: Payer: Self-pay | Admitting: Emergency Medicine

## 2021-02-16 DIAGNOSIS — R310 Gross hematuria: Secondary | ICD-10-CM | POA: Insufficient documentation

## 2021-02-16 DIAGNOSIS — K5730 Diverticulosis of large intestine without perforation or abscess without bleeding: Secondary | ICD-10-CM | POA: Diagnosis not present

## 2021-02-16 DIAGNOSIS — I700 Atherosclerosis of aorta: Secondary | ICD-10-CM | POA: Insufficient documentation

## 2021-02-16 DIAGNOSIS — N320 Bladder-neck obstruction: Secondary | ICD-10-CM | POA: Diagnosis not present

## 2021-02-16 DIAGNOSIS — R309 Painful micturition, unspecified: Secondary | ICD-10-CM | POA: Diagnosis not present

## 2021-02-16 DIAGNOSIS — R339 Retention of urine, unspecified: Secondary | ICD-10-CM | POA: Insufficient documentation

## 2021-02-16 DIAGNOSIS — Z87442 Personal history of urinary calculi: Secondary | ICD-10-CM | POA: Diagnosis not present

## 2021-02-16 DIAGNOSIS — R319 Hematuria, unspecified: Secondary | ICD-10-CM | POA: Diagnosis not present

## 2021-02-16 DIAGNOSIS — M545 Low back pain, unspecified: Secondary | ICD-10-CM | POA: Insufficient documentation

## 2021-02-16 LAB — URINALYSIS, ROUTINE W REFLEX MICROSCOPIC
Bacteria, UA: NONE SEEN
Glucose, UA: NEGATIVE mg/dL
Leukocytes,Ua: NEGATIVE
Nitrite: NEGATIVE
Protein, ur: 30 mg/dL — AB
RBC / HPF: >50 RBC/hpf — ABNORMAL HIGH (ref 0–5)
Specific Gravity, Urine: 1.02 (ref 1.005–1.030)
Squamous Epithelial / HPF: NONE SEEN (ref 0–5)
pH: 5.5 (ref 5.0–8.0)

## 2021-02-16 LAB — CBC
HCT: 41.1 % (ref 39.0–52.0)
Hemoglobin: 14.1 g/dL (ref 13.0–17.0)
MCH: 31.8 pg (ref 26.0–34.0)
MCHC: 34.3 g/dL (ref 30.0–36.0)
MCV: 92.8 fL (ref 80.0–100.0)
Platelets: 213 10*3/uL (ref 150–400)
RBC: 4.43 MIL/uL (ref 4.22–5.81)
RDW: 12.2 % (ref 11.5–15.5)
WBC: 5.4 10*3/uL (ref 4.0–10.5)
nRBC: 0 % (ref 0.0–0.2)

## 2021-02-16 LAB — BASIC METABOLIC PANEL
Anion gap: 8 (ref 5–15)
BUN: 16 mg/dL (ref 8–23)
CO2: 31 mmol/L (ref 22–32)
Calcium: 9.2 mg/dL (ref 8.9–10.3)
Chloride: 102 mmol/L (ref 98–111)
Creatinine, Ser: 1.21 mg/dL (ref 0.61–1.24)
GFR, Estimated: >60 mL/min (ref >60)
Glucose, Bld: 92 mg/dL (ref 70–99)
Potassium: 4.1 mmol/L (ref 3.5–5.1)
Sodium: 141 mmol/L (ref 135–145)

## 2021-02-16 LAB — CHLAMYDIA/NGC RT PCR (ARMC ONLY)
Chlamydia Tr: NOT DETECTED
N gonorrhoeae: NOT DETECTED

## 2021-02-16 MED ORDER — MORPHINE SULFATE (PF) 4 MG/ML IV SOLN
6.0000 mg | Freq: Once | INTRAVENOUS | Status: AC
Start: 2021-02-16 — End: 2021-02-16
  Administered 2021-02-16: 20:00:00 6 mg via INTRAVENOUS
  Filled 2021-02-16: qty 2

## 2021-02-16 MED ORDER — LACTATED RINGERS IV BOLUS
1000.0000 mL | Freq: Once | INTRAVENOUS | Status: AC
  Administered 2021-02-16: 20:00:00 1000 mL via INTRAVENOUS

## 2021-02-16 MED ORDER — ONDANSETRON HCL 4 MG/2ML IJ SOLN
4.0000 mg | Freq: Once | INTRAMUSCULAR | Status: AC
  Administered 2021-02-16: 20:00:00 4 mg via INTRAVENOUS
  Filled 2021-02-16: qty 2

## 2021-02-16 MED ORDER — OXYCODONE-ACETAMINOPHEN 5-325 MG PO TABS: 1.0000 | ORAL_TABLET | Freq: Three times a day (TID) | ORAL | 0 refills | Status: AC | PRN

## 2021-02-16 NOTE — ED Provider Triage Note (Signed)
Emergency Medicine Provider Triage Evaluation Note  Travis Gray , a 66 y.o. male  was evaluated in triage.  Pt complains of flank pain on left side x 1 month. Chronic dysuria with new onset hematuria today.  Review of Systems  Positive: Left flank pain, dysuria Negative: Fever  Physical Exam  Ht 5\' 10"  (1.778 m)    Wt 78.5 kg    BMI 24.83 kg/m  Gen:   Awake, no distress   Resp:  Normal effort  MSK:   Moves extremities without difficulty  Other:    Medical Decision Making  Medically screening exam initiated at 5:30 PM.  Appropriate orders placed.  Travis Gray was informed that the remainder of the evaluation will be completed by another provider, this initial triage assessment does not replace that evaluation, and the importance of remaining in the ED until their evaluation is complete.   Victorino Dike, FNP 02/16/21 1732

## 2021-02-16 NOTE — ED Provider Notes (Addendum)
[facility] Provider Note    Event Date/Time   First MD Initiated Contact with Patient 02/16/21 1823     (approximate)   History   Flank Pain   HPI  Korban Kohner Orlick is a 66 y.o. male with past medical history of PAD status post stenting in bilateral iliac stents on Plavix as well as BPH on Flomax who presents for assessment of 1 month of some left lower back pain that got acutely worse today associate with some gross hematuria.  He states the pain radiates around the left flank.  He does not remember any injuries or falls and has no associate abdominal pain.  States he has a chronic burning urination has been ongoing for years does not any different today.  No fevers, headache, earache, sore throat, vomiting, chest pain, cough, shortness of breath, rash or any other acute sick symptoms.  Patient denies any known history of kidney stones or any other acute known past medical history.  No other acute concerns at this time.      Physical Exam  Triage Vital Signs: ED Triage Vitals  Enc Vitals Group     BP 02/16/21 1735 116/78     Pulse Rate 02/16/21 1731 64     Resp 02/16/21 1731 16     Temp 02/16/21 1731 98.4 F (36.9 C)     Temp Source 02/16/21 1731 Oral     SpO2 --      Weight 02/16/21 1727 173 lb 1 oz (78.5 kg)     Height 02/16/21 1727 5\' 10"  (1.778 m)     Head Circumference --      Peak Flow --      Pain Score --      Pain Loc --      Pain Edu? --      Excl. in [place]? --     Most recent vital signs: Vitals:   02/16/21 1735 02/16/21 1947  BP: 116/78 113/79  Pulse:  (!) 54  Resp:  16  Temp:    SpO2:  95%    General: Awake, no distress.  CV:  Good peripheral perfusion.  2+ radial pulses.  No murmurs rubs or gallops. Resp:  Normal effort.  Clear bilaterally Abd:  No distention.  Soft throughout with some mild tenderness in the suprapubic region.  Very mild left CVA tenderness.   ED Results / Procedures / Treatments  Labs (all labs  ordered are listed, but only abnormal results are displayed) Labs Reviewed  URINALYSIS, ROUTINE W REFLEX MICROSCOPIC - Abnormal; Notable for the following components:      Result Value   Color, Urine AMBER (*)    APPearance TURBID (*)    Hgb urine dipstick LARGE (*)    Bilirubin Urine SMALL (*)    Ketones, ur TRACE (*)    Protein, ur 30 (*)    RBC / HPF >50 (*)    All other components within normal limits  URINE CULTURE  CHLAMYDIA/NGC RT PCR ([facility] ONLY)            BASIC METABOLIC PANEL  CBC     EKG    RADIOLOGY   CT abdomen pelvis reviewed by myself shows no evidence of a kidney stone or hydronephrosis.  There are some distention of the bladder.  There is evidence of diverticulosis without diverticulitis.  There is patient also has evidence of aortic calcifications consistent with atherosclerosis.  There is no AAA or diverticulitis or any other  clear acute abdominal pelvic process.  No perinephric stranding.  Also reviewed radiologist interpretation and agree with their findings.  PROCEDURES:    MEDICATIONS ORDERED IN ED: Medications  lactated ringers bolus 1,000 mL (1,000 mLs Intravenous New Bag/Given 02/16/21 1946)  ondansetron (ZOFRAN) injection 4 mg (4 mg Intravenous Given 02/16/21 1937)  morphine 4 MG/ML injection 6 mg (6 mg Intravenous Given 02/16/21 1939)     IMPRESSION / MDM / ASSESSMENT AND PLAN / ED COURSE  I reviewed the triage vital signs and the nursing notes.                              Differential diagnosis includes, but is not limited to kidney stone, pyelonephritis, cystitis possibly infectious, malignancy, and bladder outlet obstruction.  CT abdomen pelvis reviewed by myself shows no evidence of a kidney stone or hydronephrosis.  There are some distention of the bladder.  There is evidence of diverticulosis without diverticulitis.  There is patient also has evidence of aortic calcifications consistent with atherosclerosis.  There is no AAA or  diverticulitis or any other clear acute abdominal pelvic process.  No perinephric stranding.  Also reviewed radiologist interpretation and agree with their findings.  BMP shows no significant electrolyte or metabolic derangements.  CBC shows no leukocytosis or acute anemia.  UA has greater than 50 RBCs protein trace ketones and small bilirubin large hemoglobin without any visible bacteria or WBCs or nitrites to suggest cystitis at this time.  Urine culture sent as well as GC studies.  On post void scan patient has greater than 400 cc of urine.  Foley placed due to concerns for symptomatic urinary retention.  Patient will follow up with urology and I discussed this with Dr. Diamantina Providence as patient is unable to see his regular urologist for over a month.  Advised patient to hold his Plavix which he takes for stents in his legs for 48 hours and that he should discuss this with Dr. Lucky Cowboy but if he stops bleeding before then he can likely resume it.  Dr. Tish Men did recommend placing is larger Foley is possible although after extensive search for larger catheter and discussion with charge nurse there are no larger Foley's than 16 at this time in the emergency room.  Placed without difficulty.  Discharged in stable condition.  Strict return precautions advised and discussed.     FINAL CLINICAL IMPRESSION(S) / ED DIAGNOSES   Final diagnoses:  Urinary retention  Gross hematuria  Aortic atherosclerosis ([place])     Rx / DC Orders   ED Discharge Orders          Ordered    oxyCODONE-acetaminophen (PERCOCET) 5-325 MG tablet  Every 8 hours PRN        02/16/21 2039             Note:  This document was prepared using Dragon voice recognition software and may include unintentional dictation errors.   Lucrezia Starch, MD 02/16/21 2105    Lucrezia Starch, MD 02/16/21 2217

## 2021-02-16 NOTE — Telephone Encounter (Signed)
Patient called to report to Dr Nicki Reaper that this afternoon he saw it was first pink then all blood.

## 2021-02-16 NOTE — ED Notes (Signed)
E signature pad not working. Pt educated on discharge instructions and verbalized understanding.  

## 2021-02-16 NOTE — Telephone Encounter (Signed)
Reviewed.  Currently being evaluated.

## 2021-02-16 NOTE — Discharge Instructions (Addendum)
Your CT today showed: IMPRESSION: 1. Negative for kidney stones or hydronephrosis. 2. Moderate distention of the urinary bladder with chronic bladder wall thickening. Findings are suggestive for chronic bladder outlet obstruction. 3. Colonic diverticulosis without acute inflammation. 4. Aortic Atherosclerosis (ICD10-I70.0). Bilateral iliac artery stents.

## 2021-02-16 NOTE — Telephone Encounter (Signed)
FYI-  Spoke with patient. He is having bloody urine and has even noticed some blood after he is done urinating all afternoon. Still have the abd pain. Bloody urine is new. He is going to urgent care to be evaluated and then will follow up. Advised I would contact urology to try to get him in sooner than Feb 20.

## 2021-02-16 NOTE — ED Notes (Signed)
Patient transported to CT 

## 2021-02-16 NOTE — ED Triage Notes (Signed)
Presents with left flank with blood in urine

## 2021-02-16 NOTE — ED Notes (Signed)
425 mL bladder scan post void residual.

## 2021-02-16 NOTE — Telephone Encounter (Signed)
Pt has f/u appt in Feb

## 2021-02-17 ENCOUNTER — Encounter: Payer: Self-pay | Admitting: Internal Medicine

## 2021-02-17 ENCOUNTER — Telehealth (INDEPENDENT_AMBULATORY_CARE_PROVIDER_SITE_OTHER): Payer: Self-pay

## 2021-02-17 NOTE — Telephone Encounter (Signed)
Pt called stating that the doctor at the hospital asked him to ask his provider if he could stop taking the medication plavix. Pt stated he does not know if there is another blood thinner medication that he needs to stop taking also.

## 2021-02-17 NOTE — Telephone Encounter (Signed)
Yes that is fine

## 2021-02-17 NOTE — Telephone Encounter (Signed)
Pt's wife called and left a message on the nurses line to make use aware that the pt went to the ED last night for hematuria and now has a foley cath and that he was advised to stop his plavix for 48 hours  his wife wanted to know would this be ok. Please advise .

## 2021-02-17 NOTE — Telephone Encounter (Signed)
Reviewed ED recommendations and patient is going to hold plavix for 48 hours and consult with Dr Lucky Cowboy about starting plavix back or staying off of it while undergoing eval from urology

## 2021-02-18 LAB — URINE CULTURE: Culture: NO GROWTH

## 2021-02-18 NOTE — Telephone Encounter (Signed)
I called the pt's wife  and made her aware of the NP's instructions and that they can resume the medication when instructed to by urologist.

## 2021-02-23 ENCOUNTER — Ambulatory Visit: Payer: BC Managed Care – PPO | Admitting: Urology

## 2021-02-23 ENCOUNTER — Ambulatory Visit: Payer: BC Managed Care – PPO

## 2021-02-24 ENCOUNTER — Ambulatory Visit (INDEPENDENT_AMBULATORY_CARE_PROVIDER_SITE_OTHER): Payer: BC Managed Care – PPO | Admitting: Urology

## 2021-02-24 ENCOUNTER — Other Ambulatory Visit: Payer: Self-pay

## 2021-02-24 ENCOUNTER — Other Ambulatory Visit: Payer: Self-pay | Admitting: Internal Medicine

## 2021-02-24 ENCOUNTER — Ambulatory Visit: Payer: BC Managed Care – PPO | Admitting: Urology

## 2021-02-24 ENCOUNTER — Other Ambulatory Visit: Payer: Self-pay | Admitting: Urology

## 2021-02-24 ENCOUNTER — Encounter: Payer: Self-pay | Admitting: Urology

## 2021-02-24 VITALS — BP 114/81 | HR 70 | Ht 70.0 in | Wt 173.0 lb

## 2021-02-24 DIAGNOSIS — R39198 Other difficulties with micturition: Secondary | ICD-10-CM | POA: Diagnosis not present

## 2021-02-24 DIAGNOSIS — N401 Enlarged prostate with lower urinary tract symptoms: Secondary | ICD-10-CM | POA: Diagnosis not present

## 2021-02-24 DIAGNOSIS — Z122 Encounter for screening for malignant neoplasm of respiratory organs: Secondary | ICD-10-CM

## 2021-02-24 DIAGNOSIS — R310 Gross hematuria: Secondary | ICD-10-CM

## 2021-02-24 DIAGNOSIS — N138 Other obstructive and reflux uropathy: Secondary | ICD-10-CM | POA: Diagnosis not present

## 2021-02-24 LAB — BLADDER SCAN AMB NON-IMAGING

## 2021-02-24 MED ORDER — SULFAMETHOXAZOLE-TRIMETHOPRIM 800-160 MG PO TABS: 1.0000 | ORAL_TABLET | Freq: Two times a day (BID) | ORAL | 0 refills | Status: AC

## 2021-02-24 MED ORDER — SULFAMETHOXAZOLE-TRIMETHOPRIM 800-160 MG PO TABS
1.0000 | ORAL_TABLET | Freq: Once | ORAL | Status: AC
  Administered 2021-02-24: 1 via ORAL

## 2021-02-24 NOTE — Progress Notes (Signed)
Surgical Physician Order Form [facility]  * Scheduling expectation :  3 to 4 weeks  (will schedule cystoscopy in 1 to 2 weeks to rule out bladder lesions)  *Length of Case: 2 hours  *Clearance needed: no  *Anticoagulation Instructions: Hold all anticoagulants  *Aspirin Instructions: Hold Aspirin  *Post-op visit Date/Instructions:  1-3 day cath removal, 12 weeks MD PVR  *Diagnosis: BPH w/urinary obstruction  *Procedure:     HOLEP (86168)   Additional orders: N/A  -Admit type: OUTpatient  -Anesthesia: General  -VTE Prophylaxis Standing Order SCDs       Other:   -Standing Lab Orders Per Anesthesia    Lab other: UA&Urine Culture  -Standing Test orders EKG/Chest x-ray per Anesthesia       Test other:   - Medications:  Ancef 2gm IV  -Other orders:  N/A

## 2021-02-24 NOTE — Progress Notes (Signed)
° °  02/24/2021 8:42 AM   Travis Gray 1955/06/15 [id number]  Reason for visit: BPH, recurrent prostatitis, gross hematuria, recent urinary retention  HPI: 66 year old male here with his wife today after developing Foley dependent urinary retention and gross hematuria in the ER on 02/16/2021.  He has previously been followed by Dr. Matilde Sprang for prostatitis, BPH/OAB, and incomplete bladder emptying with PVRs ranging from 200-400 mL.  He has been on max dose Flomax, with persistent symptoms of weak stream, feeling of incomplete emptying, burning with urination.  He last underwent a cystoscopy in April 2021 with Dr. Arther Dames which was reportedly normal.  He presented to the ER on 02/16/2021 with a month of worsening left-sided back pain and worsening urinary symptoms with gross hematuria.  A Foley catheter was replaced with improved his urinary symptoms, including his left-sided pain.  We discussed this could be related to reflux up the left kidney from retention, or unrelated musculoskeletal issue.  I personally viewed and interpreted the CT stone protocol from that ER visit that shows a 50 g prostate with bladder distention, bladder wall thickening, no hydronephrosis or stones.  He has been holding his Plavix since that time, and urine has been yellow.  Now that he has developed Foley dependent urinary retention, and has a long history of incomplete bladder emptying, as well as gross hematuria likely from BPH, I strongly recommended considering an outlet procedure like HOLEP.  I think HOLEP will address his urinary symptoms and gross hematuria.  We discussed the possible adjustment.  Afterwards where he may have some urgency, frequency, and incontinence. We discussed the risks and benefits of HoLEP at length.  The procedure requires general anesthesia and takes 1 to 2 hours, and a holmium laser is used to enucleate the prostate and push this tissue into the bladder.  A morcellator is then used to  remove this tissue, which is sent for pathology.  The vast majority(>95%) of patients are able to discharge the same day with a catheter in place for 2 to 3 days, and will follow-up in clinic for a voiding trial.  We specifically discussed the risks of bleeding, infection, retrograde ejaculation, temporary urgency and urge incontinence, very low risk of long-term incontinence, urethral stricture/bladder neck contracture, pathologic evaluation of prostate tissue and possible detection of prostate cancer or other malignancy, and possible need for additional procedures.  Finally, with his recurrent gross hematuria and extensive smoking history, I recommended undergoing clinic cystoscopy prior to scheduling HOLEP to evaluate for other etiologies of hematuria like bladder tumors.  Foley removed today and Bactrim given for prophylaxis. He returned this afternoon for PVR of 250 mL and has been able to void spontaneously, this is near his baseline PVR and I think reasonable to leave Foley out.  Cystoscopy in 1 to 2 weeks to rule out other etiologies of gross hematuria, schedule HOLEP for 2 to 4 weeks after completed cystoscopy   Billey Co, MD  [facility] [address], [place], [place] [address] [phone]

## 2021-02-24 NOTE — Progress Notes (Signed)
Order placed for CT lung cancer screening

## 2021-02-24 NOTE — Progress Notes (Signed)
Patient ID: Travis Gray, male   DOB: 09-Jan-1956, 66 y.o.   MRN: [id number] Catheter Removal  Patient is present today for a catheter removal.  72ml of water was drained from the balloon. A 16FR foley cath was removed from the bladder no complications were noted . Patient tolerated well.  Performed by: Michele Rockers, CMA  Follow up/ Additional notes: return this afternoon for PVR at 1:15pm

## 2021-02-24 NOTE — Patient Instructions (Addendum)
Cystoscopy Cystoscopy is a procedure that is used to help diagnose and sometimes treat conditions that affect the lower urinary tract. The lower urinary tract includes the bladder and the urethra. The urethra is the tube that drains urine from the bladder. Cystoscopy is done using a thin, tube-shaped instrument with a light and camera at the end (cystoscope). The cystoscope may be hard or flexible, depending on the goal of the procedure. The cystoscope is inserted through the urethra, into the bladder. Cystoscopy may be recommended if you have: Urinary tract infections that keep coming back. Blood in the urine (hematuria). An inability to control when you urinate (urinary incontinence) or an overactive bladder. Unusual cells found in a urine sample. A blockage in the urethra, such as a urinary stone. Painful urination. An abnormality in the bladder found during an intravenous pyelogram (IVP) or CT scan. What are the risks? Generally, this is a safe procedure. However, problems may occur, including: Infection. Bleeding.  What happens during the procedure?  You will be given one or more of the following: A medicine to numb the area (local anesthetic). The area around the opening of your urethra will be cleaned. The cystoscope will be passed through your urethra into your bladder. Germ-free (sterile) fluid will flow through the cystoscope to fill your bladder. The fluid will stretch your bladder so that your health care provider can clearly examine your bladder walls. Your doctor will look at the urethra and bladder. The cystoscope will be removed The procedure may vary among health care providers  What can I expect after the procedure? After the procedure, it is common to have: Some soreness or pain in your urethra. Urinary symptoms. These include: Mild pain or burning when you urinate. Pain should stop within a few minutes after you urinate. This may last for up to a few days after the  procedure. A small amount of blood in your urine for several days. Feeling like you need to urinate but producing only a small amount of urine. Follow these instructions at home: General instructions Return to your normal activities as told by your health care provider.  Drink plenty of fluids after the procedure. Keep all follow-up visits as told by your health care provider. This is important. Contact a health care provider if you: Have pain that gets worse or does not get better with medicine, especially pain when you urinate lasting longer than 72 hours after the procedure. Have trouble urinating. Get help right away if you: Have blood clots in your urine. Have a fever or chills. Are unable to urinate. Summary Cystoscopy is a procedure that is used to help diagnose and sometimes treat conditions that affect the lower urinary tract. Cystoscopy is done using a thin, tube-shaped instrument with a light and camera at the end. After the procedure, it is common to have some soreness or pain in your urethra. It is normal to have blood in your urine after the procedure.  If you were prescribed an antibiotic medicine, take it as told by your health care provider.  This information is not intended to replace advice given to you by your health care provider. Make sure you discuss any questions you have with your health care provider. Document Revised: 01/02/2018 Document Reviewed: 01/02/2018 Elsevier Patient Education  2020 [employer]  Holmium Laser Enucleation of the Prostate (HoLEP)  HoLEP is a treatment for men with benign prostatic hyperplasia (BPH). The laser surgery removed blockages of urine flow, and is done without any   incisions on the body.     What is HoLEP?  HoLEP is a type of laser surgery used to treat obstruction (blockage) of urine flow as a result of benign prostatic hyperplasia (BPH). In men with BPH, the prostate gland is not cancerous, but has become enlarged. An  enlarged prostate can result in a number of urinary tract symptoms such as weak urinary stream, difficulty in starting urination, inability to urinate, frequent urination, or getting up at night to urinate.  HoLEP was developed in the 1990's as a more effective and less expensive surgical option for BPH, compared to other surgical options such as laser vaporization(PVP/greenlight laser), transurethral resection of the prostate(TURP), and open simple prostatectomy.   What happens during a HoLEP?  HoLEP requires general anesthesia ("asleep" throughout the procedure).   An antibiotic is given to reduce the risk of infection  A surgical instrument called a resectoscope is inserted through the urethra (the tube that carries urine from the bladder). The resectoscope has a camera that allows the surgeon to view the internal structure of the prostate gland, and to see where the incisions are being made during surgery.  The laser is inserted into the resectoscope and is used to enucleate (free up) the enlarged prostate tissue from the capsule (outer shell) and then to seal up any blood vessels. The tissue that has been removed is pushed back into the bladder.  A morcellator is placed through the resectoscope, and is used to suction out the prostate tissue that has been pushed into the bladder.  When the prostate tissue has been removed, the resectoscope is removed, and a foley catheter is placed to allow healing and drain the urine from the bladder.     What happens after a HoLEP?  More than 90% of patients go home the same day a few hours after surgery. Less than 10% will be admitted to the hospital overnight for observation to monitor the urine, or if they have other medical problems.  Fluid is flushed through the catheter for about 1 hour after surgery to clear any blood from the urine. It is normal to have some blood in the urine after surgery. The need for blood transfusion is extremely rare.   Eating and drinking are permitted after the procedure once the patient has fully awakened from anesthesia.  The catheter is usually removed 2-3 days after surgery- the patient will come to clinic to have the catheter removed and make sure they can urinate on their own.  It is very important to drink lots of fluids after surgery for one week to keep the bladder flushed.  At first, there may be some burning with urination, but this typically improved within a few hours to days. Most patients do not have a significant amount of pain, and narcotic pain medications are rarely needed.  Symptoms of urinary frequency, urgency, and even leakage are NORMAL for the first few weeks after surgery as the bladder adjusts after having to work hard against blockage from the prostate for many years. This will improve, but can sometimes take several months.  The use of pelvic floor exercises (Kegel exercises) can help improve problems with urinary incontinence.   After catheter removal, patients will be seen at 6 weeks and 6 months for symptom check  No heavy lifting for at least 2-3 weeks after surgery, however patients can walk and do light activities the first day after surgery. Return to work time depends on occupation.    What are   the advantages of HoLEP?  HoLEP has been studied in many different parts of the world and has been shown to be a safe and effective procedure. Although there are many types of BPH surgeries available, HoLEP offers a unique advantage in being able to remove a large amount of tissue without any incisions on the body, even in very large prostates, while decreasing the risk of bleeding and providing tissue for pathology (to look for cancer). This decreases the need for blood transfusions during surgery, minimizes hospital stay, and reduces the risk of needing repeat treatment.  What are the side effects of HoLEP?  Temporary burning and bleeding during urination. Some blood may be  seen in the urine for weeks after surgery and is part of the healing process.  Urinary incontinence (inability to control urine flow) is expected in all patients immediately after surgery and they should wear pads for the first few days/weeks. This typically improves over the course of several weeks. Performing Kegel exercises can help decrease leakage from stress maneuvers such as coughing, sneezing, or lifting. The rate of long term leakage is very low. Patients may also have leakage with urgency and this may be treated with medication. The risk of urge incontinence can be dependent on several factors including age, prostate size, symptoms, and other medical problems.  Retrograde ejaculation or "backwards ejaculation." In 75% of cases, the patient will not see any fluid during ejaculation after surgery.  Erectile function is generally not significantly affected.   What are the risks of HoLEP?  Injury to the urethra or development of scar tissue at a later date  Injury to the capsule of the prostate (typically treated with longer catheterization).  Injury to the bladder or ureteral orifices (where the urine from the kidney drains out)  Infection of the bladder, testes, or kidneys  Return of urinary obstruction at a later date requiring another operation (<2%)  Need for blood transfusion or re-operation due to bleeding  Failure to relieve all symptoms and/or need for prolonged catheterization after surgery  5-15% of patients are found to have previously undiagnosed prostate cancer in their specimen. Prostate cancer can be treated after HoLEP.  Standard risks of anesthesia including blood clots, heart attacks, etc  When should I call my doctor?  Fever over 101.3 degrees  Inability to urinate, or large blood clots in the urine   

## 2021-03-10 ENCOUNTER — Encounter: Payer: Self-pay | Admitting: Urology

## 2021-03-10 ENCOUNTER — Ambulatory Visit (INDEPENDENT_AMBULATORY_CARE_PROVIDER_SITE_OTHER): Payer: BC Managed Care – PPO | Admitting: Urology

## 2021-03-10 ENCOUNTER — Other Ambulatory Visit: Payer: Self-pay

## 2021-03-10 VITALS — BP 118/77 | HR 76 | Ht 70.0 in | Wt 174.0 lb

## 2021-03-10 DIAGNOSIS — R310 Gross hematuria: Secondary | ICD-10-CM

## 2021-03-10 DIAGNOSIS — N138 Other obstructive and reflux uropathy: Secondary | ICD-10-CM

## 2021-03-10 LAB — URINALYSIS, COMPLETE
Bilirubin, UA: NEGATIVE
Glucose, UA: NEGATIVE
Ketones, UA: NEGATIVE
Leukocytes,UA: NEGATIVE
Nitrite, UA: NEGATIVE
Protein,UA: NEGATIVE
RBC, UA: NEGATIVE
Specific Gravity, UA: 1.015 (ref 1.005–1.030)
Urobilinogen, Ur: 0.2 mg/dL (ref 0.2–1.0)
pH, UA: 6.5 (ref 5.0–7.5)

## 2021-03-10 LAB — MICROSCOPIC EXAMINATION
Bacteria, UA: NONE SEEN
Epithelial Cells (non renal): NONE SEEN /hpf (ref 0–10)
RBC, Urine: NONE SEEN /hpf (ref 0–2)
WBC, UA: NONE SEEN /hpf (ref 0–5)

## 2021-03-10 MED ORDER — CEPHALEXIN 250 MG PO CAPS
500.0000 mg | ORAL_CAPSULE | Freq: Once | ORAL | Status: AC
  Administered 2021-03-10: 500 mg via ORAL

## 2021-03-10 NOTE — Progress Notes (Signed)
Cystoscopy Procedure Note:  Indication: Gross hematuria  After informed consent and discussion of the procedure and its risks, Travis Gray was positioned and prepped in the standard fashion. Cystoscopy was performed with a flexible cystoscope. The urethra, bladder neck and entire bladder was visualized in a standard fashion. The prostate was large with obstructing lateral lobes and a high bladder neck. The ureteral orifices were visualized in their normal location and orientation.  No abnormalities on retroflexion.  Imaging: Prostate measures 50 g on CT  Findings: Enlarged prostate, no suspicious lesions  Assessment and Plan: We discussed the risks and benefits of HoLEP at length.  The procedure requires general anesthesia and takes 1 to 2 hours, and a holmium laser is used to enucleate the prostate and push this tissue into the bladder.  A morcellator is then used to remove this tissue, which is sent for pathology.  The vast majority(>95%) of patients are able to discharge the same day with a catheter in place for 2 to 3 days, and will follow-up in clinic for a voiding trial.  We specifically discussed the risks of bleeding, infection, retrograde ejaculation, temporary urgency and urge incontinence, very low risk of long-term incontinence, urethral stricture/bladder neck contracture, pathologic evaluation of prostate tissue and possible detection of prostate cancer or other malignancy, and possible need for additional procedures.   Proceed with HOLEP as previously discussed for BPH and urinary symptoms, we will schedule  Nickolas Madrid, MD 03/10/2021

## 2021-03-10 NOTE — Patient Instructions (Signed)

## 2021-03-11 ENCOUNTER — Other Ambulatory Visit: Payer: Self-pay | Admitting: Internal Medicine

## 2021-03-11 NOTE — Progress Notes (Signed)
[facility] Posting Form   Surgery Date/Time: Date: 03/19/2021  Surgeon: Dr. Nickolas Madrid, MD  Surgery Location: Day Surgery  Inpt ( No  )   Outpt (Yes)   Obs ( No  )   Diagnosis: BPH with Urinary Obstruction N40.1, N13.8  -CPT: 52649  Surgery: Holmium Laser Enucleation of the Prostate  Stop Anticoagulations: Yes and hold ASA  Cardiac/Medical/Pulmonary Clearance needed: no  *Orders entered into EPIC  Date: 03/11/2021    *Case booked in EPIC  Date: 03/10/2021  *Notified pt of Surgery: Date: 03/10/2021  PRE-OP UA & CX: Yes, obtained in clinic 03/10/2021  *Placed into Prior Authorization Work Fabio Bering Date: 03/11/21   Assistant/laser/rep:No

## 2021-03-13 LAB — CULTURE, URINE COMPREHENSIVE

## 2021-03-15 ENCOUNTER — Ambulatory Visit: Payer: BC Managed Care – PPO | Admitting: Urology

## 2021-03-16 ENCOUNTER — Other Ambulatory Visit: Payer: Self-pay

## 2021-03-16 ENCOUNTER — Other Ambulatory Visit
Admission: RE | Admit: 2021-03-16 | Discharge: 2021-03-16 | Disposition: A | Discharge status: Home / Self Care | Payer: BC Managed Care – PPO | Source: Ambulatory Visit | Attending: Urology | Admitting: Urology

## 2021-03-16 NOTE — Patient Instructions (Addendum)
Your procedure is scheduled on: Friday March 19, 2021. Report to Day Surgery inside [facility] 2nd floor, stop by admissions desk before getting on elevator.  To find out your arrival time please call [phone] between 1PM - 3PM on Thursday March 18, 2021.  Remember: Instructions that are not followed completely may result in serious medical risk,  up to and including death, or upon the discretion of your surgeon and anesthesiologist your  surgery may need to be rescheduled.     _X__ 1. Do not eat food or drink fluids after midnight the night before your procedure.                 No chewing gum or hard candies.  __X__2.  On the morning of surgery brush your teeth with toothpaste and water, you                may rinse your mouth with mouthwash if you wish.  Do not swallow any toothpaste or mouthwash.     _X__ 3.  No Alcohol for 24 hours before or after surgery.   _X__ 4.  Do Not Smoke or use e-cigarettes For 24 Hours Prior to Your Surgery.                 Do not use any chewable tobacco products for at least 6 hours prior to                 Surgery.  _X__  5.  Do not use any recreational drugs (marijuana, cocaine, heroin, ecstasy, MDMA or other)                For at least one week prior to your surgery.  Combination of these drugs with anesthesia                May have life threatening results.  ____  6.  Bring all medications with you on the day of surgery if instructed.   __X__  7.  Notify your doctor if there is any change in your medical condition      (cold, fever, infections).     Do not wear jewelry, make-up, hairpins, clips or nail polish. Do not wear lotions, powders, or perfumes. You may wear deodorant. Do not shave 48 hours prior to surgery. Men may shave face and neck. Do not bring valuables to the hospital.    [facility] is not responsible for any belongings or valuables.  Contacts, dentures or bridgework may not be worn into  surgery. Leave your suitcase in the car. After surgery it may be brought to your room. For patients admitted to the hospital, discharge time is determined by your treatment team.   Patients discharged the day of surgery will not be allowed to drive home.   Make arrangements for someone to be with you for the first 24 hours of your Same Day Discharge.  __X__ Take these medicines the morning of surgery with A SIP OF WATER:    1. DULoxetine (CYMBALTA) 30 MG  2. metoprolol succinate (TOPROL-XL) 50 MG   3. pantoprazole (PROTONIX) 40 MG  4. tamsulosin (FLOMAX) 0.4 MG CAPS  5. amitriptyline (ELAVIL) 25 MG   6. atorvastatin (LIPITOR) 20 MG   ____ Fleet Enema (as directed)   ____ Use CHG Soap (or wipes) as directed  ____ Use Benzoyl Peroxide Gel as instructed  ____ Use inhalers on the day of surgery  ____ Stop metformin 2 days prior to surgery  ____ Take 1/2 of usual insulin dose the night before surgery. No insulin the morning          of surgery.   __X__ Stop Plavix 3 days before surgery as instructed by your doctor.  __X__ One Week prior to surgery- Stop Anti-inflammatories such as Ibuprofen, Aleve, Advil, Motrin, meloxicam (MOBIC), diclofenac, etodolac, ketorolac, Toradol, Daypro, piroxicam, Goody's or BC powders. OK TO USE TYLENOL IF NEEDED   __X__ Stop ALL supplements until after surgery.    ____ Bring C-Pap to the hospital.    If you have any questions regarding your pre-procedure instructions,  Please call Pre-admit Testing at [phone]

## 2021-03-18 ENCOUNTER — Encounter: Payer: Self-pay | Admitting: Urgent Care

## 2021-03-18 ENCOUNTER — Other Ambulatory Visit: Payer: Self-pay

## 2021-03-18 ENCOUNTER — Other Ambulatory Visit
Admission: RE | Admit: 2021-03-18 | Discharge: 2021-03-18 | Disposition: A | Discharge status: Home / Self Care | Payer: BC Managed Care – PPO | Source: Ambulatory Visit | Attending: Urology | Admitting: Urology

## 2021-03-18 DIAGNOSIS — R338 Other retention of urine: Secondary | ICD-10-CM | POA: Diagnosis not present

## 2021-03-18 DIAGNOSIS — Z79899 Other long term (current) drug therapy: Secondary | ICD-10-CM | POA: Diagnosis not present

## 2021-03-18 DIAGNOSIS — G4730 Sleep apnea, unspecified: Secondary | ICD-10-CM | POA: Diagnosis not present

## 2021-03-18 DIAGNOSIS — Z20822 Contact with and (suspected) exposure to covid-19: Secondary | ICD-10-CM

## 2021-03-18 DIAGNOSIS — Z7902 Long term (current) use of antithrombotics/antiplatelets: Secondary | ICD-10-CM | POA: Diagnosis not present

## 2021-03-18 DIAGNOSIS — Z01812 Encounter for preprocedural laboratory examination: Secondary | ICD-10-CM | POA: Insufficient documentation

## 2021-03-18 DIAGNOSIS — N401 Enlarged prostate with lower urinary tract symptoms: Secondary | ICD-10-CM | POA: Diagnosis not present

## 2021-03-18 DIAGNOSIS — N138 Other obstructive and reflux uropathy: Secondary | ICD-10-CM | POA: Diagnosis not present

## 2021-03-18 DIAGNOSIS — R3914 Feeling of incomplete bladder emptying: Secondary | ICD-10-CM | POA: Diagnosis not present

## 2021-03-18 DIAGNOSIS — F17210 Nicotine dependence, cigarettes, uncomplicated: Secondary | ICD-10-CM | POA: Diagnosis not present

## 2021-03-18 DIAGNOSIS — K219 Gastro-esophageal reflux disease without esophagitis: Secondary | ICD-10-CM | POA: Diagnosis not present

## 2021-03-18 DIAGNOSIS — N3080 Other cystitis without hematuria: Secondary | ICD-10-CM | POA: Diagnosis not present

## 2021-03-18 LAB — SARS CORONAVIRUS 2 BY RT PCR (HOSPITAL ORDER, PERFORMED IN ~~LOC~~ HOSPITAL LAB): SARS Coronavirus 2: NEGATIVE

## 2021-03-19 ENCOUNTER — Encounter
Admission: RE | Disposition: A | Discharge status: Home / Self Care | Payer: Self-pay | Source: Home / Self Care | Attending: Urology

## 2021-03-19 ENCOUNTER — Encounter: Payer: Self-pay | Admitting: Urology

## 2021-03-19 ENCOUNTER — Ambulatory Visit: Payer: BC Managed Care – PPO

## 2021-03-19 ENCOUNTER — Ambulatory Visit
Admission: RE | Admit: 2021-03-19 | Discharge: 2021-03-19 | Disposition: A | Discharge status: Home / Self Care | Payer: BC Managed Care – PPO | Attending: Urology | Admitting: Urology

## 2021-03-19 DIAGNOSIS — N139 Obstructive and reflux uropathy, unspecified: Secondary | ICD-10-CM | POA: Diagnosis not present

## 2021-03-19 DIAGNOSIS — K219 Gastro-esophageal reflux disease without esophagitis: Secondary | ICD-10-CM | POA: Insufficient documentation

## 2021-03-19 DIAGNOSIS — N138 Other obstructive and reflux uropathy: Secondary | ICD-10-CM | POA: Insufficient documentation

## 2021-03-19 DIAGNOSIS — G4730 Sleep apnea, unspecified: Secondary | ICD-10-CM | POA: Insufficient documentation

## 2021-03-19 DIAGNOSIS — F17210 Nicotine dependence, cigarettes, uncomplicated: Secondary | ICD-10-CM | POA: Insufficient documentation

## 2021-03-19 DIAGNOSIS — N3080 Other cystitis without hematuria: Secondary | ICD-10-CM | POA: Diagnosis not present

## 2021-03-19 DIAGNOSIS — R3914 Feeling of incomplete bladder emptying: Secondary | ICD-10-CM | POA: Insufficient documentation

## 2021-03-19 DIAGNOSIS — Z7902 Long term (current) use of antithrombotics/antiplatelets: Secondary | ICD-10-CM | POA: Diagnosis not present

## 2021-03-19 DIAGNOSIS — N401 Enlarged prostate with lower urinary tract symptoms: Secondary | ICD-10-CM | POA: Diagnosis not present

## 2021-03-19 DIAGNOSIS — Z79899 Other long term (current) drug therapy: Secondary | ICD-10-CM | POA: Insufficient documentation

## 2021-03-19 DIAGNOSIS — E7800 Pure hypercholesterolemia, unspecified: Secondary | ICD-10-CM | POA: Diagnosis not present

## 2021-03-19 DIAGNOSIS — Z20822 Contact with and (suspected) exposure to covid-19: Secondary | ICD-10-CM | POA: Insufficient documentation

## 2021-03-19 DIAGNOSIS — R338 Other retention of urine: Secondary | ICD-10-CM | POA: Diagnosis not present

## 2021-03-19 HISTORY — PX: HOLEP-LASER ENUCLEATION OF THE PROSTATE WITH MORCELLATION: SHX6641

## 2021-03-19 SURGERY — ENUCLEATION, PROSTATE, USING LASER, WITH MORCELLATION
Anesthesia: General

## 2021-03-19 MED ORDER — ROCURONIUM BROMIDE 100 MG/10ML IV SOLN
INTRAVENOUS | Status: DC | PRN
  Administered 2021-03-19: 50 mg via INTRAVENOUS

## 2021-03-19 MED ORDER — FENTANYL CITRATE (PF) 100 MCG/2ML IJ SOLN
INTRAMUSCULAR | Status: AC
  Filled 2021-03-19: qty 2

## 2021-03-19 MED ORDER — ONDANSETRON HCL 4 MG/2ML IJ SOLN: 4.0000 mg | Freq: Once | INTRAMUSCULAR | Status: DC | PRN

## 2021-03-19 MED ORDER — PROPOFOL 10 MG/ML IV BOLUS
INTRAVENOUS | Status: DC | PRN
Start: 2021-03-19 — End: 2021-03-19
  Administered 2021-03-19: 150 mg via INTRAVENOUS
  Administered 2021-03-19: 30 mg via INTRAVENOUS

## 2021-03-19 MED ORDER — MIDAZOLAM HCL 2 MG/2ML IJ SOLN
INTRAMUSCULAR | Status: AC
  Filled 2021-03-19: qty 2

## 2021-03-19 MED ORDER — ROCURONIUM BROMIDE 10 MG/ML (PF) SYRINGE
PREFILLED_SYRINGE | INTRAVENOUS | Status: AC
  Filled 2021-03-19: qty 10

## 2021-03-19 MED ORDER — PHENYLEPHRINE HCL-NACL 20-0.9 MG/250ML-% IV SOLN
INTRAVENOUS | Status: DC | PRN
  Administered 2021-03-19: 20 ug/min via INTRAVENOUS

## 2021-03-19 MED ORDER — DEXAMETHASONE SODIUM PHOSPHATE 10 MG/ML IJ SOLN
INTRAMUSCULAR | Status: AC
  Filled 2021-03-19: qty 1

## 2021-03-19 MED ORDER — SODIUM CHLORIDE 0.9 % IR SOLN
Status: DC | PRN
  Administered 2021-03-19: 30000 mL via INTRAVESICAL

## 2021-03-19 MED ORDER — MIDAZOLAM HCL 2 MG/2ML IJ SOLN
INTRAMUSCULAR | Status: DC | PRN
Start: 2021-03-19 — End: 2021-03-19
  Administered 2021-03-19: 2 mg via INTRAVENOUS

## 2021-03-19 MED ORDER — FENTANYL CITRATE (PF) 100 MCG/2ML IJ SOLN
25.0000 ug | INTRAMUSCULAR | Status: DC | PRN
  Administered 2021-03-19: 25 ug via INTRAVENOUS
  Administered 2021-03-19: 50 ug via INTRAVENOUS
  Administered 2021-03-19: 25 ug via INTRAVENOUS

## 2021-03-19 MED ORDER — HYDROCODONE-ACETAMINOPHEN 5-325 MG PO TABS: 1.0000 | ORAL_TABLET | Freq: Four times a day (QID) | ORAL | 0 refills | Status: AC | PRN

## 2021-03-19 MED ORDER — SUGAMMADEX SODIUM 200 MG/2ML IV SOLN
INTRAVENOUS | Status: DC | PRN
Start: 2021-03-19 — End: 2021-03-19
  Administered 2021-03-19: 200 mg via INTRAVENOUS

## 2021-03-19 MED ORDER — GLYCOPYRROLATE 0.2 MG/ML IJ SOLN
INTRAMUSCULAR | Status: DC | PRN
  Administered 2021-03-19: .2 mg via INTRAVENOUS

## 2021-03-19 MED ORDER — ONDANSETRON HCL 4 MG/2ML IJ SOLN
INTRAMUSCULAR | Status: AC
  Filled 2021-03-19: qty 2

## 2021-03-19 MED ORDER — LIDOCAINE HCL (CARDIAC) PF 100 MG/5ML IV SOSY
PREFILLED_SYRINGE | INTRAVENOUS | Status: DC | PRN
  Administered 2021-03-19: 100 mg via INTRAVENOUS

## 2021-03-19 MED ORDER — ORAL CARE MOUTH RINSE: 15.0000 mL | Freq: Once | OROMUCOSAL | Status: AC

## 2021-03-19 MED ORDER — CEFAZOLIN SODIUM-DEXTROSE 2-4 GM/100ML-% IV SOLN
INTRAVENOUS | Status: AC
  Filled 2021-03-19: qty 100

## 2021-03-19 MED ORDER — LIDOCAINE HCL (PF) 2 % IJ SOLN
INTRAMUSCULAR | Status: AC
  Filled 2021-03-19: qty 5

## 2021-03-19 MED ORDER — ONDANSETRON HCL 4 MG/2ML IJ SOLN
INTRAMUSCULAR | Status: DC | PRN
  Administered 2021-03-19: 4 mg via INTRAVENOUS

## 2021-03-19 MED ORDER — PHENYLEPHRINE HCL (PRESSORS) 10 MG/ML IV SOLN
INTRAVENOUS | Status: DC | PRN
Start: 2021-03-19 — End: 2021-03-19
  Administered 2021-03-19: 120 ug via INTRAVENOUS
  Administered 2021-03-19: 80 ug via INTRAVENOUS

## 2021-03-19 MED ORDER — PROPOFOL 10 MG/ML IV BOLUS
INTRAVENOUS | Status: AC
  Filled 2021-03-19: qty 20

## 2021-03-19 MED ORDER — PROPOFOL 10 MG/ML IV BOLUS
INTRAVENOUS | Status: AC
  Filled 2021-03-19: qty 40

## 2021-03-19 MED ORDER — OXYCODONE HCL 5 MG PO TABS
5.0000 mg | ORAL_TABLET | Freq: Once | ORAL | Status: AC
  Administered 2021-03-19: 5 mg via ORAL

## 2021-03-19 MED ORDER — ACETAMINOPHEN 10 MG/ML IV SOLN
INTRAVENOUS | Status: DC | PRN
Start: 2021-03-19 — End: 2021-03-19
  Administered 2021-03-19: 1000 mg via INTRAVENOUS

## 2021-03-19 MED ORDER — CEFAZOLIN SODIUM-DEXTROSE 2-4 GM/100ML-% IV SOLN
2.0000 g | INTRAVENOUS | Status: AC
  Administered 2021-03-19: 2 g via INTRAVENOUS

## 2021-03-19 MED ORDER — PHENYLEPHRINE HCL-NACL 20-0.9 MG/250ML-% IV SOLN
INTRAVENOUS | Status: AC
  Filled 2021-03-19: qty 250

## 2021-03-19 MED ORDER — DEXAMETHASONE SODIUM PHOSPHATE 10 MG/ML IJ SOLN
INTRAMUSCULAR | Status: DC | PRN
  Administered 2021-03-19: 8 mg via INTRAVENOUS

## 2021-03-19 MED ORDER — OXYCODONE HCL 5 MG PO TABS
ORAL_TABLET | ORAL | Status: AC
  Filled 2021-03-19: qty 1

## 2021-03-19 MED ORDER — LACTATED RINGERS IV SOLN: INTRAVENOUS | Status: DC

## 2021-03-19 MED ORDER — ACETAMINOPHEN 10 MG/ML IV SOLN
INTRAVENOUS | Status: AC
  Filled 2021-03-19: qty 100

## 2021-03-19 MED ORDER — CHLORHEXIDINE GLUCONATE 0.12 % MT SOLN: 15.0000 mL | Freq: Once | OROMUCOSAL | Status: AC

## 2021-03-19 MED ORDER — KETAMINE HCL 10 MG/ML IJ SOLN
INTRAMUSCULAR | Status: DC | PRN
  Administered 2021-03-19: 40 mg via INTRAVENOUS

## 2021-03-19 MED ORDER — FENTANYL CITRATE (PF) 100 MCG/2ML IJ SOLN
INTRAMUSCULAR | Status: DC | PRN
  Administered 2021-03-19 (×2): 50 ug via INTRAVENOUS

## 2021-03-19 MED ORDER — CHLORHEXIDINE GLUCONATE 0.12 % MT SOLN
OROMUCOSAL | Status: AC
  Administered 2021-03-19: 15 mL via OROMUCOSAL
  Filled 2021-03-19: qty 15

## 2021-03-19 MED ORDER — GLYCOPYRROLATE 0.2 MG/ML IJ SOLN
INTRAMUSCULAR | Status: AC
  Filled 2021-03-19: qty 1

## 2021-03-19 MED ORDER — KETAMINE HCL 50 MG/5ML IJ SOSY
PREFILLED_SYRINGE | INTRAMUSCULAR | Status: AC
  Filled 2021-03-19: qty 5

## 2021-03-19 SURGICAL SUPPLY — 36 items
ADAPTER IRRIG TUBE 2 SPIKE SOL (ADAPTER) ×4 IMPLANT
BAG URO DRAIN 4000ML (MISCELLANEOUS) ×2 IMPLANT
CATH FOLEY 3WAY 30CC 24FR (CATHETERS) ×1
CATH URETL OPEN END 4X70 (CATHETERS) ×2 IMPLANT
CATH URTH STD 24FR FL 3W 2 (CATHETERS) ×1 IMPLANT
CONTAINER COLLECT MORCELLATR (MISCELLANEOUS) ×1 IMPLANT
DRAPE UTILITY 15X26 TOWEL STRL (DRAPES) ×1 IMPLANT
ELECT BIVAP BIPO 22/24 DONUT (ELECTROSURGICAL)
ELECTRD BIVAP BIPO 22/24 DONUT (ELECTROSURGICAL) IMPLANT
FIBER LASER MOSES 550 DFL (Laser) ×1 IMPLANT
FILTER OVERFLOW MORCELLATOR (FILTER) ×1 IMPLANT
GAUZE 4X4 16PLY ~~LOC~~+RFID DBL (SPONGE) ×4 IMPLANT
GLOVE SURG UNDER POLY LF SZ7.5 (GLOVE) ×2 IMPLANT
GOWN STRL REUS W/ TWL LRG LVL3 (GOWN DISPOSABLE) ×1 IMPLANT
GOWN STRL REUS W/ TWL XL LVL3 (GOWN DISPOSABLE) ×1 IMPLANT
GOWN STRL REUS W/TWL LRG LVL3 (GOWN DISPOSABLE) ×1
GOWN STRL REUS W/TWL XL LVL3 (GOWN DISPOSABLE) ×1
HOLDER FOLEY CATH W/STRAP (MISCELLANEOUS) ×2 IMPLANT
IV NS IRRIG 3000ML ARTHROMATIC (IV SOLUTION) ×15 IMPLANT
KIT TURNOVER CYSTO (KITS) ×2 IMPLANT
MANIFOLD NEPTUNE II (INSTRUMENTS) IMPLANT
MBRN O SEALING YLW 17 FOR INST (MISCELLANEOUS) ×2
MEMBRANE SLNG YLW 17 FOR INST (MISCELLANEOUS) ×1 IMPLANT
MORCELLATOR COLLECT CONTAINER (MISCELLANEOUS) ×2
MORCELLATOR OVERFLOW FILTER (FILTER) ×2
MORCELLATOR ROTATION 4.75 335 (MISCELLANEOUS) ×2 IMPLANT
PACK CYSTO AR (MISCELLANEOUS) ×2 IMPLANT
SET CYSTO W/LG BORE CLAMP LF (SET/KITS/TRAYS/PACK) ×2 IMPLANT
SET IRRIG Y TYPE TUR BLADDER L (SET/KITS/TRAYS/PACK) ×2 IMPLANT
SLEEVE PROTECTION STRL DISP (MISCELLANEOUS) ×4 IMPLANT
SURGILUBE 2OZ TUBE FLIPTOP (MISCELLANEOUS) ×2 IMPLANT
SYR TOOMEY IRRIG 70ML (MISCELLANEOUS) ×2
SYRINGE TOOMEY IRRIG 70ML (MISCELLANEOUS) ×1 IMPLANT
TUBE PUMP MORCELLATOR PIRANHA (TUBING) ×2 IMPLANT
WATER STERILE IRR 1000ML POUR (IV SOLUTION) ×2 IMPLANT
WATER STERILE IRR 500ML POUR (IV SOLUTION) ×2 IMPLANT

## 2021-03-19 NOTE — H&P (Signed)
03/19/21 7:07 AM   Brita Romp Tish Frederickson 07-09-1955 [id number]  CC: bph  HPI:  He has previously been followed by Dr. Matilde Sprang for prostatitis, BPH/OAB, and incomplete bladder emptying with PVRs ranging from 200-400 mL.  He has been on max dose Flomax, with persistent symptoms of weak stream, feeling of incomplete emptying, burning with urination.  He last underwent a cystoscopy in April 2021 with Dr. Arther Dames which was reportedly normal.   He presented to the ER on 02/16/2021 with a month of worsening left-sided back pain and worsening urinary symptoms with gross hematuria.  A Foley catheter was replaced with improved his urinary symptoms, including his left-sided pain.  We discussed this could be related to reflux up the left kidney from retention, or unrelated musculoskeletal issue.  I personally viewed and interpreted the CT stone protocol from that ER visit that shows a 50 g prostate with bladder distention, bladder wall thickening, no hydronephrosis or stones.  He has been holding his Plavix since that time, and urine has been yellow.   Now that he has developed Foley dependent urinary retention, and has a long history of incomplete bladder emptying, as well as gross hematuria likely from BPH, I strongly recommended considering an outlet procedure like HOLEP. Cystoscopy showed no other evidence of gross hematuria and obstructive appearing prostate.   PMH: Past Medical History:  Diagnosis Date   Chronic headaches    migraines, 3-4x/yr   Degenerative disc disease, cervical    Genetic testing 03/20/2017   Multi-Cancer panel (83 genes) @ [facility] - No pathogenic mutations detected   GERD (gastroesophageal reflux disease)    Hypercholesterolemia    Hypertension    Obesity    Peripheral vascular disease (HCC)    groin and right leg stents   Sleep apnea    CPAP   Tobacco abuse     Surgical History: Past Surgical History:  Procedure Laterality Date   CARDIAC CATHETERIZATION      CATARACT EXTRACTION W/PHACO Left 04/11/2018   Procedure: CATARACT EXTRACTION PHACO AND INTRAOCULAR LENS PLACEMENT ([place])  COMPLICATED LEFT;  Surgeon: Leandrew Koyanagi, MD;  Location: [place];  Service: Ophthalmology;  Laterality: Left;  OMIDRIA sleep apnea   COLONOSCOPY WITH PROPOFOL N/A 11/25/2016   Procedure: COLONOSCOPY WITH PROPOFOL;  Surgeon: Toledo, [name], MD;  Location: [facility];  Service: Endoscopy;  Laterality: N/A;   ESOPHAGOGASTRODUODENOSCOPY (EGD) WITH PROPOFOL N/A 11/25/2016   Procedure: ESOPHAGOGASTRODUODENOSCOPY (EGD) WITH PROPOFOL;  Surgeon: Toledo, [name], MD;  Location: [facility];  Service: Endoscopy;  Laterality: N/A;   HERNIA REPAIR     Inguinal,laparoscopic surgery   PERIPHERAL VASCULAR CATHETERIZATION Right 05/21/2015   Procedure: Lower Extremity Angiography;  Surgeon: Algernon Huxley, MD;  Location: [place] [facility];  Service: Cardiovascular;  Laterality: Right;   PERIPHERAL VASCULAR CATHETERIZATION  05/21/2015   Procedure: Lower Extremity Intervention;  Surgeon: Algernon Huxley, MD;  Location: [place] [facility];  Service: Cardiovascular;;   radio-ablated therapy     completed in the pain clinic    Family History: Family History  Problem Relation Age of Onset   Hypertension Mother    Breast cancer Mother 29   Uterine cancer Mother        unconfirmed; dx 83s; TAH/BSO; currently 51   Hyperlipidemia Father    Bladder Cancer Paternal Grandfather 50       smoker; deceased 31   Diabetes Maternal Grandmother    Non-Hodgkin's lymphoma Paternal Aunt  deceased 30    Social History:  reports that she has been smoking cigarettes. She has a 9.25 pack-year smoking history. She has never used smokeless tobacco. She reports current alcohol use. She reports that she does not use drugs.  Physical Exam: BP 129/83    Pulse 61    Temp 97.9 F (36.6 C) (Temporal)    Resp 18    Ht 5\' 10"  (1.778 m)    Wt 78.9 kg    SpO2 97%    BMI 24.96 kg/m     Constitutional:  Alert and oriented, No acute distress. Cardiovascular: Regular rate and rhythm Respiratory: Clear to auscultation bilaterally GI: Abdomen is soft, nontender, nondistended, no abdominal masses   Laboratory Data: Urine culture 03/10/2021 no growth   Assessment & Plan:   66 year old male with long history of BPH, incomplete bladder emptying, Foley dependent urinary retention, and gross hematuria of prostatic source.  We discussed the risks and benefits of HoLEP at length.  The procedure requires general anesthesia and takes 1 to 2 hours, and a holmium laser is used to enucleate the prostate and push this tissue into the bladder.  A morcellator is then used to remove this tissue, which is sent for pathology.  The vast majority(>95%) of patients are able to discharge the same day with a catheter in place for 2 to 3 days, and will follow-up in clinic for a voiding trial.  We specifically discussed the risks of bleeding, infection, retrograde ejaculation, temporary urgency and urge incontinence, very low risk of long-term incontinence, urethral stricture/bladder neck contracture, pathologic evaluation of prostate tissue and possible detection of prostate cancer or other malignancy, and possible need for additional procedures.  HOLEP today  Nickolas Madrid, MD 03/19/2021  [facility] [address], [place], [place] [address] [phone]

## 2021-03-19 NOTE — Anesthesia Procedure Notes (Signed)
Procedure Name: Intubation Date/Time: 03/19/2021 7:37 AM Performed by: Bea Graff, RN Pre-anesthesia Checklist: Patient identified, Patient being monitored, Timeout performed, Emergency Drugs available and Suction available Patient Re-evaluated:Patient Re-evaluated prior to induction Oxygen Delivery Method: Circle system utilized Preoxygenation: Pre-oxygenation with 100% oxygen Induction Type: IV induction Ventilation: Mask ventilation without difficulty and Oral airway inserted - appropriate to patient size Laryngoscope Size: 3 and McGraph Grade View: Grade I Tube type: Oral Tube size: 7.0 mm Number of attempts: 1 Airway Equipment and Method: Stylet Placement Confirmation: ETT inserted through vocal cords under direct vision, positive ETCO2 and breath sounds checked- equal and bilateral Secured at: 22 cm Tube secured with: Tape Dental Injury: Teeth and Oropharynx as per pre-operative assessment

## 2021-03-19 NOTE — Anesthesia Preprocedure Evaluation (Signed)
Anesthesia Evaluation  Patient identified by MRN, date of birth, ID band Patient awake    Reviewed: Allergy & Precautions, H&P , NPO status , Patient's Chart, lab work & pertinent test results  History of Anesthesia Complications Negative for: history of anesthetic complications  Airway Mallampati: III  TM Distance: <3 FB Neck ROM: limited    Dental  (+) Chipped, Missing, Caps, Dental Advidsory Given   Pulmonary neg shortness of breath, sleep apnea and Continuous Positive Airway Pressure Ventilation , neg recent URI, Current Smoker and Patient abstained from smoking., former smoker,           Cardiovascular Exercise Tolerance: Good hypertension, (-) angina+ Peripheral Vascular Disease  (-) Past MI, (-) Cardiac Stents and (-) DOE (-) dysrhythmias (-) Valvular Problems/Murmurs     Neuro/Psych  Headaches, neg Seizures negative psych ROS   GI/Hepatic negative GI ROS, Neg liver ROS, GERD  Medicated and Controlled,  Endo/Other  negative endocrine ROS  Renal/GU negative Renal ROS  negative genitourinary   Musculoskeletal  (+) Arthritis ,   Abdominal   Peds  Hematology negative hematology ROS (+)   Anesthesia Other Findings Past Medical History: No date: Chronic headaches No date: Degenerative disc disease, cervical No date: Hypercholesterolemia No date: Hypertension No date: Peripheral vascular disease (HCC) No date: Tobacco abuse  Past Surgical History: No date: HERNIA REPAIR     Comment:  Inguinal,laparoscopic surgery 05/21/2015: PERIPHERAL VASCULAR CATHETERIZATION; Right     Comment:  Procedure: Lower Extremity Angiography;  Surgeon: Algernon Huxley, MD;  Location: [place] [facility];  Service:               Cardiovascular;  Laterality: Right; 05/21/2015: PERIPHERAL VASCULAR CATHETERIZATION     Comment:  Procedure: Lower Extremity Intervention;  Surgeon: Algernon Huxley, MD;  Location: [place] [facility];  Service:               Cardiovascular;; No date: radio-ablated therapy     Comment:  completed in the pain clinic  BMI    Body Mass Index:  25.84 kg/m      Reproductive/Obstetrics negative OB ROS                             Anesthesia Physical  Anesthesia Plan  ASA: 3  Anesthesia Plan: General   Post-op Pain Management:    Induction: Intravenous  PONV Risk Score and Plan: 2 and Ondansetron, Dexamethasone, Midazolam and Treatment may vary due to age or medical condition  Airway Management Planned: LMA and Oral ETT  Additional Equipment:   Intra-op Plan:   Post-operative Plan: Extubation in OR  Informed Consent: I have reviewed the patients History and Physical, chart, labs and discussed the procedure including the risks, benefits and alternatives for the proposed anesthesia with the patient or authorized representative who has indicated his/her understanding and acceptance.     Dental Advisory Given  Plan Discussed with: Anesthesiologist, CRNA and Surgeon  Anesthesia Plan Comments: (Patient consented for risks of anesthesia including but not limited to:  - adverse reactions to medications - risk of intubation if required - damage to teeth, lips or other oral mucosa - sore throat or hoarseness - Damage to heart, brain, lungs or loss of life  Patient voiced understanding.)  Anesthesia Quick Evaluation

## 2021-03-19 NOTE — Progress Notes (Signed)
Pt foley cath bag changed to standard size bag. Cath care discussed with both patient and wife both verbalized understanding and agree to plan of care.

## 2021-03-19 NOTE — Transfer of Care (Signed)
Immediate Anesthesia Transfer of Care Note  Patient: Travis Gray  Procedure(s) Performed: HOLEP-LASER ENUCLEATION OF THE PROSTATE WITH MORCELLATION  Patient Location: PACU  Anesthesia Type:General  Level of Consciousness: awake and drowsy  Airway & Oxygen Therapy: Patient Spontanous Breathing and Patient connected to face mask oxygen  Post-op Assessment: Report given to RN and Post -op Vital signs reviewed and stable  Post vital signs: Reviewed and stable  Last Vitals:  Vitals Value Taken Time  BP 184/98 03/19/21 0840  Temp    Pulse 73 03/19/21 0843  Resp 22 03/19/21 0843  SpO2 99 % 03/19/21 0843  Vitals shown include unvalidated device data.  Last Pain:  Vitals:   03/19/21 0637  TempSrc: Temporal      Patients Stated Pain Goal: 0 (64/[birth year]/83 0940)  Complications: No notable events documented.

## 2021-03-19 NOTE — Discharge Instructions (Signed)

## 2021-03-19 NOTE — Progress Notes (Signed)
Per. Dr. Diamantina Providence patient can restart plavix on Wednesday 3/1. Also called Dr. Erenest Rasher anesthesia for pain medication po order.

## 2021-03-19 NOTE — Op Note (Signed)
Date of procedure: 03/19/21  Preoperative diagnosis:  BPH with obstruction  Postoperative diagnosis:  Same  Procedure: HoLEP (Holmium Laser Enucleation of the Prostate)  Surgeon: Nickolas Madrid, MD  Anesthesia: General  Complications: None  Intraoperative findings:  Moderate size prostate with a high bladder neck and obstructing lateral lobes Mild to moderate trabeculations, ureteral orifices orthotopic bilaterally Uncomplicated HOLEP, verumontanum and ureteral orifices intact at conclusion of case, excellent hemostasis  EBL: Minimal  Specimens: Prostate chips  Enucleation time: 33 minutes  Morcellation time: 7 minutes  Intra-op weight: 25 g  Drains: 24 French three-way, 60 cc in balloon  Indication: Travis Gray is a 66 y.o. patient with BPH with incomplete bladder emptying, elevated PVRs, and history of urinary retention.  After reviewing the management options for treatment, they elected to proceed with the above surgical procedure(s). We have discussed the potential benefits and risks of the procedure, side effects of the proposed treatment, the likelihood of the patient achieving the goals of the procedure, and any potential problems that might occur during the procedure or recuperation.  We specifically discussed the risks of bleeding, infection, hematuria and clot retention, need for additional procedures, possible overnight hospital stay, temporary urgency and incontinence, rare long-term incontinence, and retrograde ejaculation.  Informed consent has been obtained.   Description of procedure:  The patient was taken to the operating room and general anesthesia was induced.  The patient was placed in the dorsal lithotomy position, prepped and draped in the usual sterile fashion, and preoperative antibiotics(Ancef) were administered.  SCDs were placed for DVT prophylaxis.  A preoperative time-out was performed.   Travis Gray sounds were used to gently dilated the  urethra up to 45F. The 88 French continuous flow resectoscope was inserted into the urethra using the visual obturator  The prostate was moderate in size with a very high and tight bladder neck and obstructing lateral lobes. The bladder was thoroughly inspected and notable for mild to moderate trabeculations but no suspicious lesions.  The ureteral orifices were located in orthotopic position.  The laser was set to 2 J and 60 Hz and was used to make a lambda incision just proximal to the verumontanum down to the level of the capsule.  A 6 o'clock incision was then made down to the level of the capsule from the bladder neck to the verumontanum.  The lateral lobes were then incised circumferentially until they were disconnected from the surrounding tissue.  The capsule was examined and laser was used for meticulous hemostasis.    The 71 French resectoscope was then switched out for the 18 French nephroscope and the lobes were morcellated and the tissue sent to pathology.  A 24 French three-way catheter was inserted easily with the aid of a catheter guide, and 60 cc were placed in the balloon.  Urine was clear.  The catheter irrigated easily with a Toomey syringe.  CBI was initiated. A belladonna suppository was placed.  The patient tolerated the procedure well without any immediate complications and was extubated and transferred to the recovery room in stable condition.  Urine was clear on fast CBI.  Disposition: Stable to PACU  Plan: Wean CBI in PACU, anticipate discharge home today with Foley removal in clinic in 2-3 days  Nickolas Madrid, MD 03/19/2021

## 2021-03-22 LAB — SURGICAL PATHOLOGY

## 2021-03-22 NOTE — Anesthesia Postprocedure Evaluation (Signed)
Anesthesia Post Note  Patient: Travis Gray  Procedure(s) Performed: HOLEP-LASER ENUCLEATION OF THE PROSTATE WITH MORCELLATION  Patient location during evaluation: PACU Anesthesia Type: General Level of consciousness: awake and alert Pain management: pain level controlled Vital Signs Assessment: post-procedure vital signs reviewed and stable Respiratory status: spontaneous breathing, nonlabored ventilation, respiratory function stable and patient connected to nasal cannula oxygen Cardiovascular status: blood pressure returned to baseline and stable Postop Assessment: no apparent nausea or vomiting Anesthetic complications: no   No notable events documented.   Last Vitals:  Vitals:   03/19/21 0938 03/19/21 1011  BP:  (!) 157/75  Pulse: 61 (!) 51  Resp:  16  Temp:  36.4 C  SpO2:  95%    Last Pain:  Vitals:   03/19/21 1011  TempSrc: Tympanic  PainSc: 3                  Martha Clan

## 2021-03-23 ENCOUNTER — Other Ambulatory Visit: Payer: Self-pay

## 2021-03-23 ENCOUNTER — Ambulatory Visit: Payer: BC Managed Care – PPO | Admitting: *Deleted

## 2021-03-23 DIAGNOSIS — N138 Other obstructive and reflux uropathy: Secondary | ICD-10-CM

## 2021-03-23 NOTE — Progress Notes (Signed)
Catheter Removal  Patient is present today for a catheter removal.  68ml of water was drained from the balloon. A 24FR foley cath was removed from the bladder no complications were noted . Patient tolerated well.  Performed by: Gaspar Cola CMA

## 2021-04-14 DIAGNOSIS — M5459 Other low back pain: Secondary | ICD-10-CM | POA: Diagnosis not present

## 2021-04-14 DIAGNOSIS — M545 Low back pain, unspecified: Secondary | ICD-10-CM | POA: Diagnosis not present

## 2021-04-15 ENCOUNTER — Other Ambulatory Visit: Payer: Self-pay | Admitting: *Deleted

## 2021-04-15 DIAGNOSIS — Z87891 Personal history of nicotine dependence: Secondary | ICD-10-CM

## 2021-04-15 DIAGNOSIS — F17210 Nicotine dependence, cigarettes, uncomplicated: Secondary | ICD-10-CM

## 2021-04-27 ENCOUNTER — Ambulatory Visit
Admission: RE | Admit: 2021-04-27 | Discharge: 2021-04-27 | Disposition: A | Discharge status: Home / Self Care | Payer: BC Managed Care – PPO | Source: Ambulatory Visit | Attending: Internal Medicine | Admitting: Internal Medicine

## 2021-04-27 ENCOUNTER — Other Ambulatory Visit (INDEPENDENT_AMBULATORY_CARE_PROVIDER_SITE_OTHER): Payer: Self-pay | Admitting: Nurse Practitioner

## 2021-04-27 ENCOUNTER — Other Ambulatory Visit: Payer: Self-pay

## 2021-04-27 DIAGNOSIS — F17210 Nicotine dependence, cigarettes, uncomplicated: Secondary | ICD-10-CM | POA: Insufficient documentation

## 2021-04-27 DIAGNOSIS — Z87891 Personal history of nicotine dependence: Secondary | ICD-10-CM | POA: Insufficient documentation

## 2021-04-28 ENCOUNTER — Other Ambulatory Visit: Payer: Self-pay

## 2021-04-28 DIAGNOSIS — M6281 Muscle weakness (generalized): Secondary | ICD-10-CM | POA: Diagnosis not present

## 2021-04-28 DIAGNOSIS — Z122 Encounter for screening for malignant neoplasm of respiratory organs: Secondary | ICD-10-CM

## 2021-04-28 DIAGNOSIS — M5441 Lumbago with sciatica, right side: Secondary | ICD-10-CM | POA: Diagnosis not present

## 2021-04-28 DIAGNOSIS — F17210 Nicotine dependence, cigarettes, uncomplicated: Secondary | ICD-10-CM

## 2021-04-28 DIAGNOSIS — Z87891 Personal history of nicotine dependence: Secondary | ICD-10-CM

## 2021-04-28 DIAGNOSIS — M5442 Lumbago with sciatica, left side: Secondary | ICD-10-CM | POA: Diagnosis not present

## 2021-04-28 DIAGNOSIS — G8929 Other chronic pain: Secondary | ICD-10-CM | POA: Diagnosis not present

## 2021-05-13 ENCOUNTER — Encounter: Payer: Self-pay | Admitting: Internal Medicine

## 2021-05-13 ENCOUNTER — Ambulatory Visit (INDEPENDENT_AMBULATORY_CARE_PROVIDER_SITE_OTHER): Payer: BC Managed Care – PPO | Admitting: Internal Medicine

## 2021-05-13 VITALS — BP 130/72 | HR 62 | Temp 98.6°F | Resp 12 | Ht 70.0 in | Wt 169.6 lb

## 2021-05-13 DIAGNOSIS — Z720 Tobacco use: Secondary | ICD-10-CM

## 2021-05-13 DIAGNOSIS — E7800 Pure hypercholesterolemia, unspecified: Secondary | ICD-10-CM

## 2021-05-13 DIAGNOSIS — Z0000 Encounter for general adult medical examination without abnormal findings: Secondary | ICD-10-CM | POA: Diagnosis not present

## 2021-05-13 DIAGNOSIS — I10 Essential (primary) hypertension: Secondary | ICD-10-CM

## 2021-05-13 DIAGNOSIS — R911 Solitary pulmonary nodule: Secondary | ICD-10-CM

## 2021-05-13 DIAGNOSIS — Z86010 Personal history of colonic polyps: Secondary | ICD-10-CM

## 2021-05-13 DIAGNOSIS — K219 Gastro-esophageal reflux disease without esophagitis: Secondary | ICD-10-CM

## 2021-05-13 DIAGNOSIS — I739 Peripheral vascular disease, unspecified: Secondary | ICD-10-CM

## 2021-05-13 DIAGNOSIS — R739 Hyperglycemia, unspecified: Secondary | ICD-10-CM | POA: Diagnosis not present

## 2021-05-13 DIAGNOSIS — M25522 Pain in left elbow: Secondary | ICD-10-CM

## 2021-05-13 DIAGNOSIS — M5441 Lumbago with sciatica, right side: Secondary | ICD-10-CM

## 2021-05-13 DIAGNOSIS — I700 Atherosclerosis of aorta: Secondary | ICD-10-CM

## 2021-05-13 DIAGNOSIS — W19XXXA Unspecified fall, initial encounter: Secondary | ICD-10-CM

## 2021-05-13 DIAGNOSIS — D649 Anemia, unspecified: Secondary | ICD-10-CM

## 2021-05-13 LAB — LIPID PANEL
Cholesterol: 167 mg/dL (ref 0–200)
HDL: 32.7 mg/dL — ABNORMAL LOW (ref >39.00)
NonHDL: 134.67
Total CHOL/HDL Ratio: 5
Triglycerides: 308 mg/dL — ABNORMAL HIGH (ref 0.0–149.0)
VLDL: 61.6 mg/dL — ABNORMAL HIGH (ref 0.0–40.0)

## 2021-05-13 LAB — HEPATIC FUNCTION PANEL
ALT: 13 U/L (ref 0–53)
AST: 14 U/L (ref 0–37)
Albumin: 4.3 g/dL (ref 3.5–5.2)
Alkaline Phosphatase: 70 U/L (ref 39–117)
Bilirubin, Direct: 0.1 mg/dL (ref 0.0–0.3)
Total Bilirubin: 0.6 mg/dL (ref 0.2–1.2)
Total Protein: 6.5 g/dL (ref 6.0–8.3)

## 2021-05-13 LAB — BASIC METABOLIC PANEL
BUN: 17 mg/dL (ref 6–23)
CO2: 31 mEq/L (ref 19–32)
Calcium: 9 mg/dL (ref 8.4–10.5)
Chloride: 101 mEq/L (ref 96–112)
Creatinine, Ser: 1.2 mg/dL (ref 0.40–1.50)
GFR: 64.13 mL/min (ref >60.00)
Glucose, Bld: 98 mg/dL (ref 70–99)
Potassium: 4.2 mEq/L (ref 3.5–5.1)
Sodium: 139 mEq/L (ref 135–145)

## 2021-05-13 LAB — LDL CHOLESTEROL, DIRECT: Direct LDL: 70 mg/dL

## 2021-05-13 LAB — HEMOGLOBIN A1C: Hgb A1c MFr Bld: 5.8 % (ref 4.6–6.5)

## 2021-05-13 NOTE — Progress Notes (Signed)
Patient ID: Travis Gray, adult   DOB: 1955/08/01, 66 y.o.   MRN: [id number] ? ? ?Subjective:  ? ? Patient ID: Travis Gray, adult    DOB: 1955/10/29, 66 y.o.   MRN: [id number] ? ?This visit occurred during the SARS-CoV-2 public health emergency.  Safety protocols were in place, including screening questions prior to the visit, additional usage of staff PPE, and extensive cleaning of exam room while observing appropriate contact time as indicated for disinfecting solutions.  ? ?Patient here for his physical exam.  ? ?Chief Complaint  ?Patient presents with  ? Annual Exam  ?  CPE  ? .  ? ?HPI ?Doing relatively well.  Some persistent back issues - mostly right side.  Left side symptoms improved after urological intervention.  Staying active.  No chest pain or sob reported.  Still smoking a few cigarettes, but does not smoke daily.  No abdominal pain or bowel change reported.  Did fall yesterday. Was getting a ground hog out of his garage.  Turned and tripped.  Landed on both knees and left elbow.  No head injury. Some soreness, but is able to walk, etc.  Good rom.  ? ? ?Past Medical History:  ?Diagnosis Date  ? Chronic headaches   ? migraines, 3-4x/yr  ? Degenerative disc disease, cervical   ? Genetic testing 03/20/2017  ? Multi-Cancer panel (83 genes) @ [facility] - No pathogenic mutations detected  ? GERD (gastroesophageal reflux disease)   ? Hypercholesterolemia   ? Hypertension   ? Obesity   ? Peripheral vascular disease ([place])   ? groin and right leg stents  ? Sleep apnea   ? CPAP  ? Tobacco abuse   ? ?Past Surgical History:  ?Procedure Laterality Date  ? CARDIAC CATHETERIZATION    ? CATARACT EXTRACTION W/PHACO Left 04/11/2018  ? Procedure: CATARACT EXTRACTION PHACO AND INTRAOCULAR LENS PLACEMENT ([place])  COMPLICATED LEFT;  Surgeon: Leandrew Koyanagi, MD;  Location: [place];  Service: Ophthalmology;  Laterality: Left;  OMIDRIA ?sleep apnea  ? COLONOSCOPY WITH PROPOFOL N/A 11/25/2016  ? Procedure:  COLONOSCOPY WITH PROPOFOL;  Surgeon: Toledo, [name], MD;  Location: [facility];  Service: Endoscopy;  Laterality: N/A;  ? ESOPHAGOGASTRODUODENOSCOPY (EGD) WITH PROPOFOL N/A 11/25/2016  ? Procedure: ESOPHAGOGASTRODUODENOSCOPY (EGD) WITH PROPOFOL;  Surgeon: Toledo, [name], MD;  Location: [facility];  Service: Endoscopy;  Laterality: N/A;  ? HERNIA REPAIR    ? Inguinal,laparoscopic surgery  ? HOLEP-LASER ENUCLEATION OF THE PROSTATE WITH MORCELLATION N/A 03/19/2021  ? Procedure: HOLEP-LASER ENUCLEATION OF THE PROSTATE WITH MORCELLATION;  Surgeon: Billey Co, MD;  Location: [facility];  Service: Urology;  Laterality: N/A;  ? PERIPHERAL VASCULAR CATHETERIZATION Right 05/21/2015  ? Procedure: Lower Extremity Angiography;  Surgeon: Algernon Huxley, MD;  Location: [place] [facility];  Service: Cardiovascular;  Laterality: Right;  ? PERIPHERAL VASCULAR CATHETERIZATION  05/21/2015  ? Procedure: Lower Extremity Intervention;  Surgeon: Algernon Huxley, MD;  Location: [place] [facility];  Service: Cardiovascular;;  ? radio-ablated therapy    ? completed in the pain clinic  ? ?Family History  ?Problem Relation Age of Onset  ? Hypertension Mother   ? Breast cancer Mother 66  ? Uterine cancer Mother   ?     unconfirmed; dx 2s; TAH/BSO; currently 66  ? Hyperlipidemia Father   ? Bladder Cancer Paternal Grandfather 53  ?     smoker; deceased 28  ? Diabetes Maternal Grandmother   ? Non-Hodgkin's lymphoma Paternal Aunt   ?  deceased 108  ? ?Social History  ? ?Socioeconomic History  ? Marital status: Married  ?  Spouse name: Not on file  ? Number of children: Not on file  ? Years of education: Not on file  ? Highest education level: Not on file  ?Occupational History  ? Not on file  ?Tobacco Use  ? Smoking status: Some Days  ?  Packs/day: 0.25  ?  Years: 37.00  ?  Pack years: 9.25  ?  Types: Cigarettes  ? Smokeless tobacco: Never  ?Vaping Use  ? Vaping Use: Never used  ?Substance and Sexual Activity  ? Alcohol use: Yes  ?   Alcohol/week: 0.0 standard drinks  ?  Comment: very occasional alcohol use - 3-4x/yr  ? Drug use: No  ? Sexual activity: Not on file  ?Other Topics Concern  ? Not on file  ?Social History Narrative  ? Not on file  ? ?Social Determinants of Health  ? ?Financial Resource Strain: Not on file  ?Food Insecurity: Not on file  ?Transportation Needs: Not on file  ?Physical Activity: Not on file  ?Stress: Not on file  ?Social Connections: Not on file  ? ? ? ?Review of Systems  ?Constitutional:  Negative for appetite change and unexpected weight change.  ?HENT:  Negative for congestion, sinus pressure and sore throat.   ?Eyes:  Negative for pain and visual disturbance.  ?Respiratory:  Negative for cough, chest tightness and shortness of breath.   ?Cardiovascular:  Negative for chest pain, palpitations and leg swelling.  ?Gastrointestinal:  Negative for abdominal pain, diarrhea, nausea and vomiting.  ?Genitourinary:  Negative for difficulty urinating and dysuria.  ?Musculoskeletal:  Negative for back pain.  ?     Knee pain s/p fall.    ?Skin:  Negative for rash and wound.  ?Neurological:  Negative for dizziness, light-headedness and headaches.  ?Hematological:  Negative for adenopathy. Does not bruise/bleed easily.  ?Psychiatric/Behavioral:  Negative for agitation and dysphoric mood.   ? ?   ?Objective:  ?  ? ?BP 130/72 (BP Location: Left Arm, Patient Position: Sitting, Cuff Size: Small)   Pulse 62   Temp 98.6 ?F (37 ?C) (Temporal)   Resp 12   Ht '5\' 10"'$  (1.778 m)   Wt 169 lb 9.6 oz (76.9 kg)   SpO2 99%   BMI 24.34 kg/m?  ?Wt Readings from Last 3 Encounters:  ?05/13/21 169 lb 9.6 oz (76.9 kg)  ?04/27/21 170 lb (77.1 kg)  ?03/19/21 173 lb 15.1 oz (78.9 kg)  ? ? ?Physical Exam ?Constitutional:   ?   General: She is not in acute distress. ?   Appearance: Normal appearance. She is well-developed.  ?HENT:  ?   Head: Normocephalic and atraumatic.  ?   Right Ear: External ear normal.  ?   Left Ear: External ear normal.  ?Eyes:   ?   General: No scleral icterus.    ?   Right eye: No discharge.     ?   Left eye: No discharge.  ?   Conjunctiva/sclera: Conjunctivae normal.  ?Neck:  ?   Thyroid: No thyromegaly.  ?Cardiovascular:  ?   Rate and Rhythm: Normal rate and regular rhythm.  ?Pulmonary:  ?   Effort: No respiratory distress.  ?   Breath sounds: Normal breath sounds. No wheezing.  ?Abdominal:  ?   General: Bowel sounds are normal.  ?   Palpations: Abdomen is soft.  ?   Tenderness: There is no abdominal tenderness.  ?Musculoskeletal:     ?  General: No tenderness.  ?   Cervical back: Neck supple. No tenderness.  ?   Comments: Minimal soft tissue swelling - left knee.  No increased erythema.  Good rom.   ?Lymphadenopathy:  ?   Cervical: No cervical adenopathy.  ?Skin: ?   Findings: No erythema or rash.  ?Neurological:  ?   Mental Status: She is alert and oriented to person, place, and time.  ?Psychiatric:     ?   Mood and Affect: Mood normal.     ?   Behavior: Behavior normal.  ? ? ? ?Outpatient Encounter Medications as of 05/13/2021  ?Medication Sig  ? amitriptyline (ELAVIL) 25 MG tablet TAKE 1 TABLET BY MOUTH AT BEDTIME  ? atorvastatin (LIPITOR) 20 MG tablet TAKE 1 TABLET BY MOUTH AT BEDTIME (Patient taking differently: Take 20 mg by mouth daily.)  ? clopidogrel (PLAVIX) 75 MG tablet TAKE 1 TABLET BY MOUTH ONCE DAILY  ? DULoxetine (CYMBALTA) 30 MG capsule TAKE 1 CAPSULE BY MOUTH ONCE DAILY  ? hydrochlorothiazide (HYDRODIURIL) 25 MG tablet TAKE 1 TABLET BY MOUTH ONCE DAILY  ? losartan (COZAAR) 100 MG tablet TAKE 1 TABLET BY MOUTH ONCE DAILY  ? metoprolol succinate (TOPROL-XL) 50 MG 24 hr tablet TAKE 1 TABLET BY MOUTH ONCE DAILY (Patient taking differently: 25 mg daily.)  ? omega-3 acid ethyl esters (LOVAZA) 1 g capsule Take 1 capsule (1 g total) by mouth daily. (Patient taking differently: Take 2 g by mouth daily.)  ? pantoprazole (PROTONIX) 40 MG tablet TAKE 1 TABLET BY MOUTH ONCE DAILY  ? vitamin B-12 (CYANOCOBALAMIN) 1000 MCG tablet  Take 1,000 mcg by mouth daily.  ? ?No facility-administered encounter medications on file as of 05/13/2021.  ?  ? ?Lab Results  ?Component Value Date  ? WBC 5.4 02/16/2021  ? HGB 14.1 02/16/2021  ? HCT 41.1 01/24/

## 2021-05-13 NOTE — Assessment & Plan Note (Addendum)
Physical today 05/13/21.  Colonoscopy 11/2016 - moderate diverticulosis, 49m polyp rectum and non bleeding internal hemorrhoids.  Dr TAlice Reichert- recommended f/u 5 years.  PSA 1.74 (01/2021).  ?

## 2021-05-14 ENCOUNTER — Encounter: Payer: Self-pay | Admitting: Internal Medicine

## 2021-05-14 DIAGNOSIS — W19XXXA Unspecified fall, initial encounter: Secondary | ICD-10-CM | POA: Insufficient documentation

## 2021-05-14 NOTE — Assessment & Plan Note (Signed)
Low carb diet and exercise.  Follow met b and a1c.  ?

## 2021-05-14 NOTE — Assessment & Plan Note (Signed)
CT chest 04/27/21 - Lung-RADS 2, benign appearance or behavior. Continue annual screening with low-dose chest CT without contrast in 12 months. 

## 2021-05-14 NOTE — Assessment & Plan Note (Signed)
Continue lipitor  ?

## 2021-05-14 NOTE — Assessment & Plan Note (Signed)
Pressure as outlined.  Doing well.  Continue losartan and hctz.  Follow pressures.  Follow metabolic panel.  No changes.  

## 2021-05-14 NOTE — Assessment & Plan Note (Signed)
Still some right low back pain. Left improved as outlined.  Stable.  Follow.  ?

## 2021-05-14 NOTE — Assessment & Plan Note (Addendum)
Evaluated by Dr Dew 02/02/21.  ABIs .94 right and .96 left - normal pressures and waveforms bilaterally.  Over 5 years s/p intervention.  Recommended continuing annual checks.   

## 2021-05-14 NOTE — Assessment & Plan Note (Signed)
Smoking a few cigarettes occasionally.  Have discussed the need to stop.  Follow.  

## 2021-05-14 NOTE — Assessment & Plan Note (Signed)
Colonoscopy 11/2016 - rectal polyp - tubular adenoma.  Recommended f/u colonoscopy in 5 years. (Dr Alice Reichert).  ?

## 2021-05-14 NOTE — Assessment & Plan Note (Signed)
Follow cbc.  

## 2021-05-14 NOTE — Assessment & Plan Note (Signed)
Continue lipitor.  Low cholesterol diet and exercise as tolerated.  Improved.  ?Lab Results  ?Component Value Date  ? CHOL 167 05/13/2021  ? HDL 32.70 (L) 05/13/2021  ? [place] 60 04/24/2013  ? LDLDIRECT 70.0 05/13/2021  ? TRIG 308.0 (H) 05/13/2021  ? CHOLHDL 5 05/13/2021  ? ?

## 2021-05-14 NOTE — Assessment & Plan Note (Signed)
No upper symptoms reported.  On protonix.   

## 2021-05-14 NOTE — Assessment & Plan Note (Signed)
Tripped and fell yesterday - as outlined.  Some minimal knee and left elbow discomfort.  Exam as outlined.  Good rom.  Monitor. Notify me if persistent symptoms.  ?

## 2021-05-14 NOTE — Assessment & Plan Note (Signed)
Left elbow pain s/p fall yesterday.  Exam as outlined.  Good rom.  Follow.  Notify me if persistent problems.  ?

## 2021-05-17 ENCOUNTER — Ambulatory Visit: Payer: Self-pay | Admitting: Urology

## 2021-05-18 ENCOUNTER — Other Ambulatory Visit: Payer: Self-pay | Admitting: Internal Medicine

## 2021-05-27 ENCOUNTER — Other Ambulatory Visit: Payer: Self-pay | Admitting: Internal Medicine

## 2021-05-27 DIAGNOSIS — H40003 Preglaucoma, unspecified, bilateral: Secondary | ICD-10-CM | POA: Diagnosis not present

## 2021-06-02 ENCOUNTER — Ambulatory Visit: Payer: BC Managed Care – PPO | Admitting: Urology

## 2021-06-10 DIAGNOSIS — C44319 Basal cell carcinoma of skin of other parts of face: Secondary | ICD-10-CM | POA: Diagnosis not present

## 2021-06-10 DIAGNOSIS — C4441 Basal cell carcinoma of skin of scalp and neck: Secondary | ICD-10-CM | POA: Diagnosis not present

## 2021-06-10 DIAGNOSIS — L570 Actinic keratosis: Secondary | ICD-10-CM | POA: Diagnosis not present

## 2021-06-10 DIAGNOSIS — D492 Neoplasm of unspecified behavior of bone, soft tissue, and skin: Secondary | ICD-10-CM | POA: Diagnosis not present

## 2021-06-10 DIAGNOSIS — C44329 Squamous cell carcinoma of skin of other parts of face: Secondary | ICD-10-CM | POA: Diagnosis not present

## 2021-06-10 DIAGNOSIS — C44619 Basal cell carcinoma of skin of left upper limb, including shoulder: Secondary | ICD-10-CM | POA: Diagnosis not present

## 2021-06-12 ENCOUNTER — Other Ambulatory Visit: Payer: Self-pay | Admitting: Internal Medicine

## 2021-06-23 ENCOUNTER — Encounter: Payer: Self-pay | Admitting: Urology

## 2021-06-23 ENCOUNTER — Ambulatory Visit: Payer: BC Managed Care – PPO | Admitting: Urology

## 2021-06-23 ENCOUNTER — Ambulatory Visit (INDEPENDENT_AMBULATORY_CARE_PROVIDER_SITE_OTHER): Payer: BC Managed Care – PPO | Admitting: Urology

## 2021-06-23 VITALS — BP 161/92 | HR 57 | Ht 70.0 in | Wt 169.0 lb

## 2021-06-23 DIAGNOSIS — Z125 Encounter for screening for malignant neoplasm of prostate: Secondary | ICD-10-CM

## 2021-06-23 DIAGNOSIS — N138 Other obstructive and reflux uropathy: Secondary | ICD-10-CM | POA: Diagnosis not present

## 2021-06-23 DIAGNOSIS — N401 Enlarged prostate with lower urinary tract symptoms: Secondary | ICD-10-CM

## 2021-06-23 LAB — BLADDER SCAN AMB NON-IMAGING

## 2021-06-23 NOTE — Progress Notes (Signed)
   06/23/2021 8:19 AM   Travis Gray March 13, 1955 [id number]  Reason for visit: Follow up BPH status post HOLEP  HPI: 66 year old male with long history of prostatitis, BPH/OAB, incomplete bladder emptying with PVRs ranging from 200 to 400 mL, and history of urinary retention and gross hematuria.  He underwent a HOLEP on 03/19/2021 with removal of 25 g benign prostate tissue.  He is doing very well since surgery.  Having some very mild stress incontinence that continues to improve.  Minimal urgency, no nocturia.  Excellent stream.  PVR normal at 11 mL today.  Gross hematuria has resolved.  Left-sided flank pain also improved which may have been secondary to reflux prior to catheter placement.  Discussed Kegel exercises.  RTC 9 months PVR, PSA prior  Billey Co, MD  [facility] [address], [place], [place] [address] [phone]

## 2021-06-23 NOTE — Patient Instructions (Signed)
Kegel Exercises  Kegel exercises can help strengthen your pelvic floor muscles. The pelvic floor is a group of muscles that support your rectum, small intestine, and bladder. In females, pelvic floor muscles also help support the uterus. These muscles help you control the flow of urine and stool (feces). Kegel exercises are painless and simple. They do not require any equipment. Your provider may suggest Kegel exercises to: Improve bladder and bowel control. Improve sexual response. Improve weak pelvic floor muscles after surgery to remove the uterus (hysterectomy) or after pregnancy, in females. Improve weak pelvic floor muscles after prostate gland removal or surgery, in males. Kegel exercises involve squeezing your pelvic floor muscles. These are the same muscles you squeeze when you try to stop the flow of urine or keep from passing gas. The exercises can be done while sitting, standing, or lying down, but it is best to vary your position. Ask your health care provider which exercises are safe for you. Do exercises exactly as told by your health care provider and adjust them as directed. Do not begin these exercises until told by your health care provider. Exercises How to do Kegel exercises: Squeeze your pelvic floor muscles tight. You should feel a tight lift in your rectal area. If you are a male, you should also feel a tightness in your vaginal area. Keep your stomach, buttocks, and legs relaxed. Hold the muscles tight for up to 10 seconds. Breathe normally. Relax your muscles for up to 10 seconds. Repeat as told by your health care provider. Repeat this exercise daily as told by your health care provider. Continue to do this exercise for at least 4-6 weeks, or for as long as told by your health care provider. You may be referred to a physical therapist who can help you learn more about how to do Kegel exercises. Depending on your condition, your health care provider may  recommend: Varying how long you squeeze your muscles. Doing several sets of exercises every day. Doing exercises for several weeks. Making Kegel exercises a part of your regular exercise routine. This information is not intended to replace advice given to you by your health care provider. Make sure you discuss any questions you have with your health care provider. Document Revised: 05/21/2020 Document Reviewed: 05/21/2020 Elsevier Patient Education  2023 Elsevier Inc.  

## 2021-06-24 DIAGNOSIS — D492 Neoplasm of unspecified behavior of bone, soft tissue, and skin: Secondary | ICD-10-CM | POA: Diagnosis not present

## 2021-06-24 DIAGNOSIS — L570 Actinic keratosis: Secondary | ICD-10-CM | POA: Diagnosis not present

## 2021-06-24 DIAGNOSIS — C44619 Basal cell carcinoma of skin of left upper limb, including shoulder: Secondary | ICD-10-CM | POA: Diagnosis not present

## 2021-06-24 DIAGNOSIS — C4441 Basal cell carcinoma of skin of scalp and neck: Secondary | ICD-10-CM | POA: Diagnosis not present

## 2021-06-24 DIAGNOSIS — C44212 Basal cell carcinoma of skin of right ear and external auricular canal: Secondary | ICD-10-CM | POA: Diagnosis not present

## 2021-07-08 DIAGNOSIS — L988 Other specified disorders of the skin and subcutaneous tissue: Secondary | ICD-10-CM | POA: Diagnosis not present

## 2021-07-08 DIAGNOSIS — C44619 Basal cell carcinoma of skin of left upper limb, including shoulder: Secondary | ICD-10-CM | POA: Diagnosis not present

## 2021-07-13 DIAGNOSIS — E7800 Pure hypercholesterolemia, unspecified: Secondary | ICD-10-CM | POA: Diagnosis not present

## 2021-07-13 DIAGNOSIS — L814 Other melanin hyperpigmentation: Secondary | ICD-10-CM | POA: Diagnosis not present

## 2021-07-13 DIAGNOSIS — L821 Other seborrheic keratosis: Secondary | ICD-10-CM | POA: Diagnosis not present

## 2021-07-13 DIAGNOSIS — C44329 Squamous cell carcinoma of skin of other parts of face: Secondary | ICD-10-CM | POA: Diagnosis not present

## 2021-07-13 DIAGNOSIS — C4441 Basal cell carcinoma of skin of scalp and neck: Secondary | ICD-10-CM | POA: Diagnosis not present

## 2021-07-13 DIAGNOSIS — I10 Essential (primary) hypertension: Secondary | ICD-10-CM | POA: Diagnosis not present

## 2021-08-16 IMAGING — CT CT CHEST LUNG CANCER SCREENING LOW DOSE W/O CM
2 of 5 series · 15 of 40 positions shown, 18 images · non-contrast
Comparison: 09/14/2017.

CLINICAL DATA: Former smoker, quit 3 years ago, 37 pack-year
history.

EXAM:
CT CHEST WITHOUT CONTRAST LOW-DOSE FOR LUNG CANCER SCREENING
TECHNIQUE: Multidetector CT imaging of the chest was performed following the
standard protocol without IV contrast.

[Series 3: lung · axial · 0.69mm/px · z∈[-1267,-930]mm · 12 of 375 slices shown, 15 images]
[im 19/375  mediastinal]
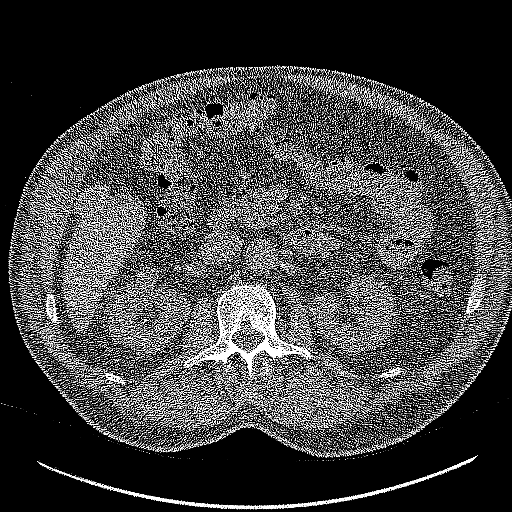
[im 19/375  lung]
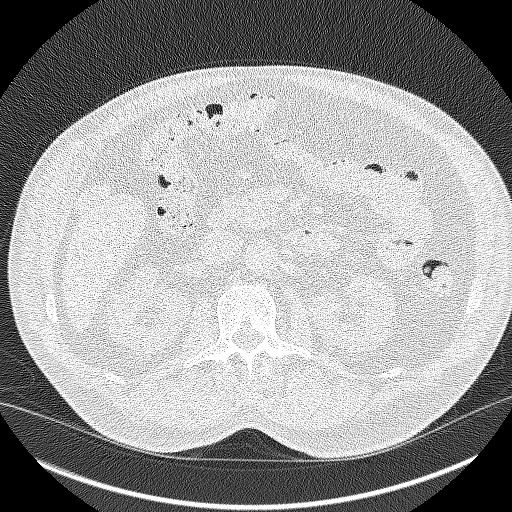
[im 57/375  lung]
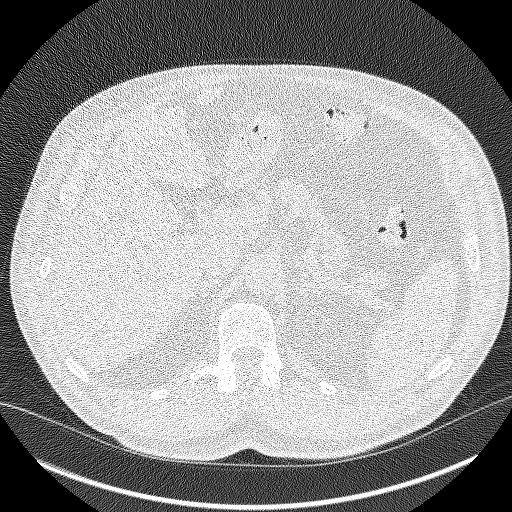
[im 75/375  lung]
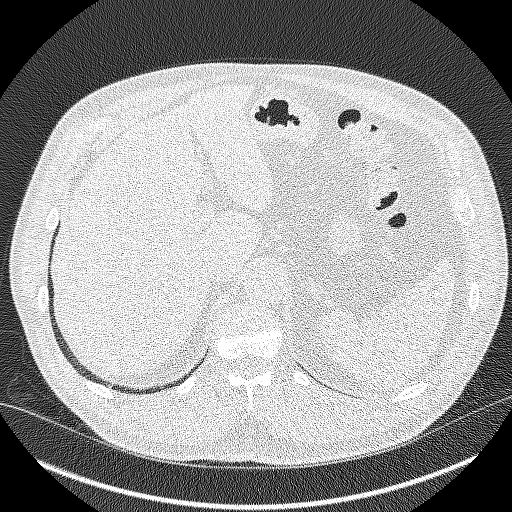
[im 113/375  lung]
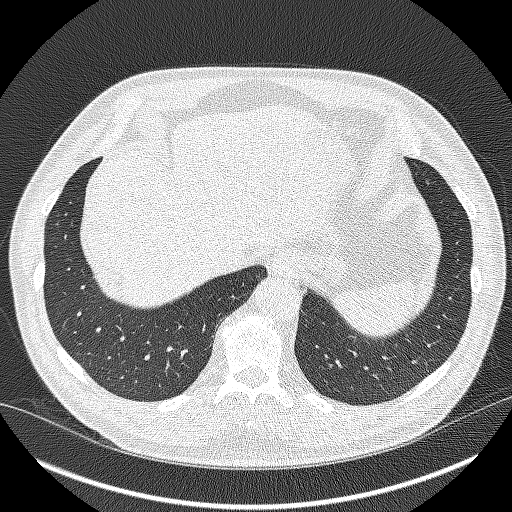
[im 150/375  mediastinal]
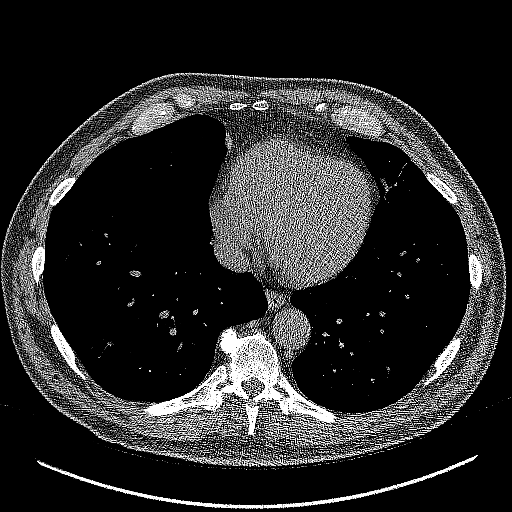
[im 150/375  lung]
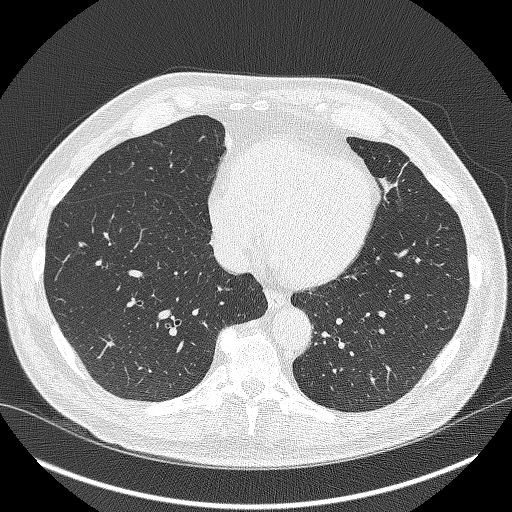
[im 169/375  lung]
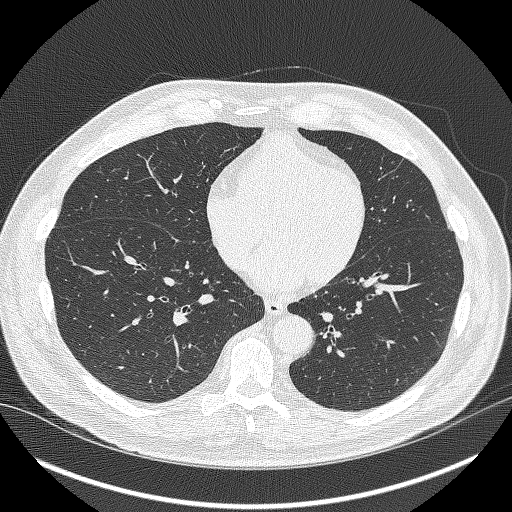
[im 206/375  lung]
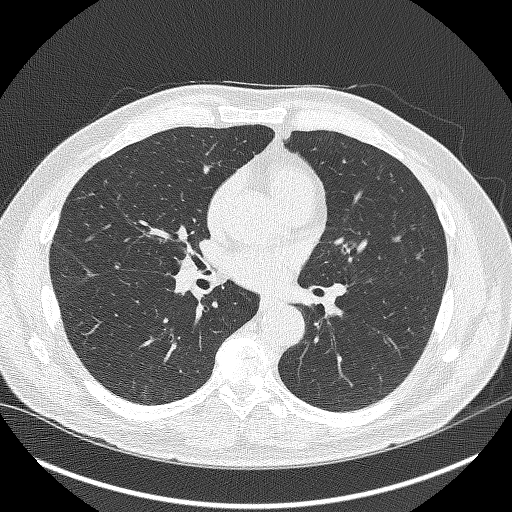
[im 225/375  lung]
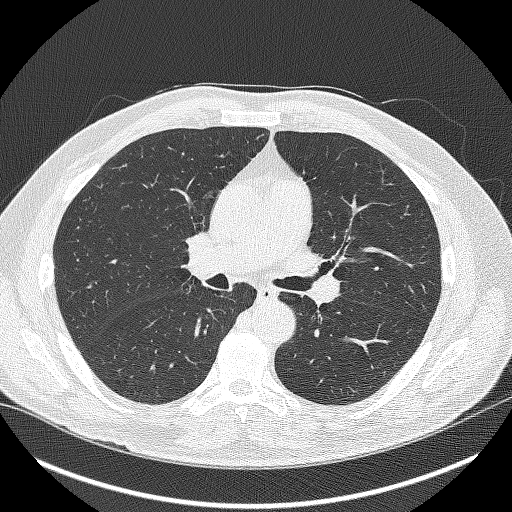
[im 262/375  mediastinal]
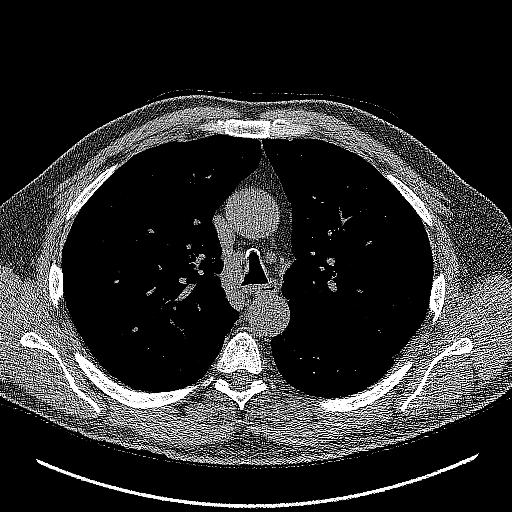
[im 262/375  lung]
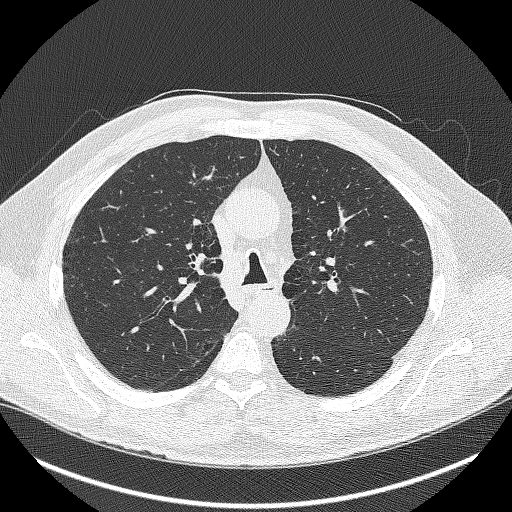
[im 300/375  lung]
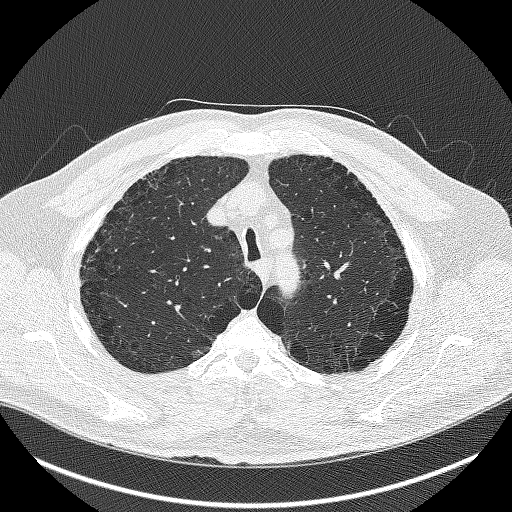
[im 318/375  lung]
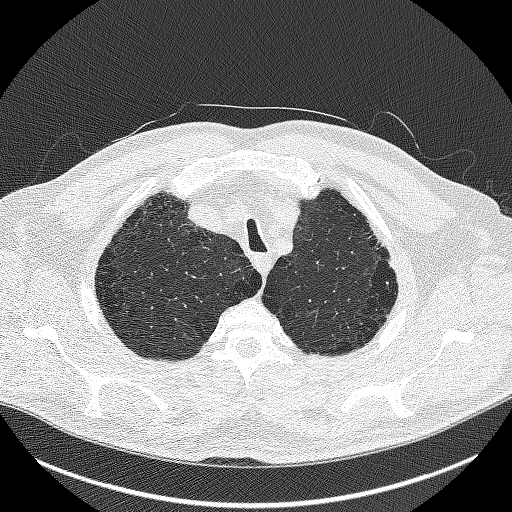
[im 356/375  lung]
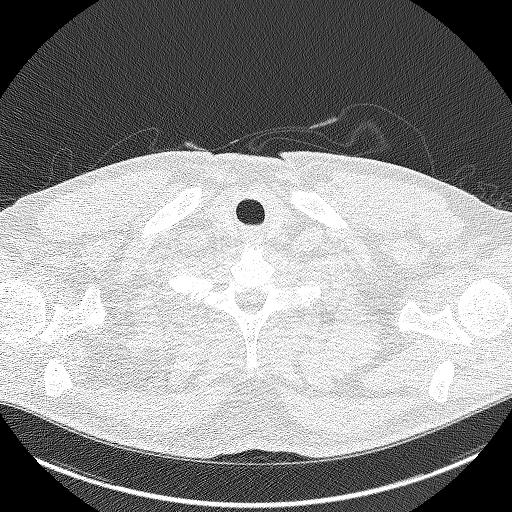

[Series 4: coronals lung · coronal · 0.69mm/px · 3 of 347 slices shown]
[im 70/347  lung]
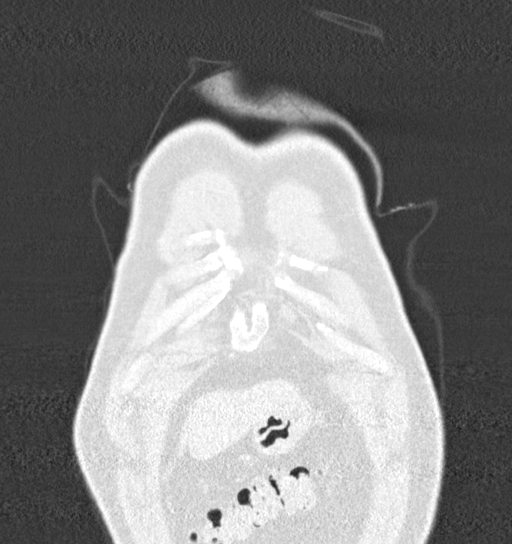
[im 139/347  lung]
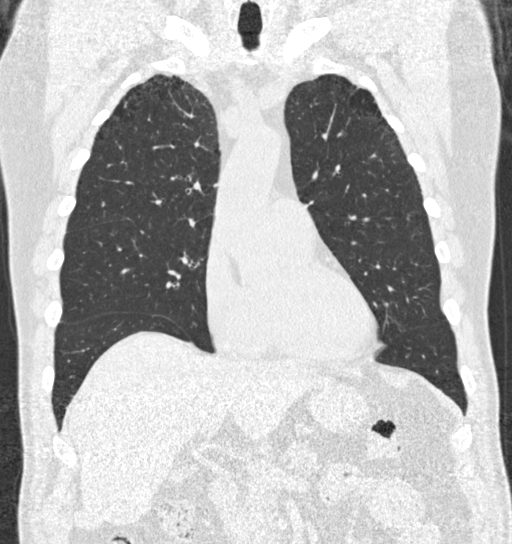
[im 208/347  lung]
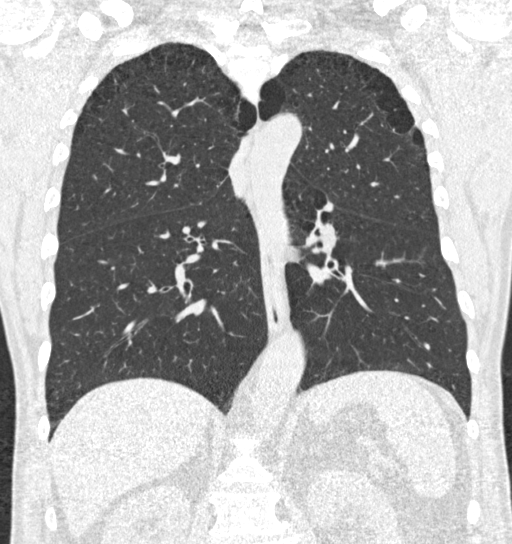

[15 of 40 positions shown; findings below may reference images not displayed]

FINDINGS: Cardiovascular: Coronary artery calcification. Heart size normal. No
pericardial effusion.

Mediastinum/Nodes: No pathologically enlarged mediastinal or
axillary lymph nodes. Hilar regions are difficult to definitively
evaluate without IV contrast but appear grossly unremarkable.
Esophagus is grossly unremarkable.

Lungs/Pleura: Centrilobular and paraseptal emphysema. Pulmonary
nodules measure 8.4 mm or less in size, as before. No new pulmonary
nodules. No pleural fluid. Airway is unremarkable.

Upper Abdomen: Visualized portions of the liver, gallbladder,
adrenal glands, kidneys, spleen, pancreas, stomach and bowel are
unremarkable.

Musculoskeletal: No worrisome lytic or sclerotic lesions.
IMPRESSION: 1. Lung-RADS 2, benign appearance or behavior. Continue annual
screening with low-dose chest CT without contrast in 12 months.
2. Coronary artery calcification.
3.  Emphysema ([id number]).

## 2021-08-19 ENCOUNTER — Telehealth: Payer: Self-pay | Admitting: *Deleted

## 2021-08-19 NOTE — Chronic Care Management (AMB) (Signed)
  Care Coordination  Note  08/19/2021 Name: Khameron Gruenwald MRN: [id number] DOB: 1955/04/28  Bran Aldridge is a 66 y.o. year old male who is a primary care patient of [name], MD. I reached out to Jodelle Red by phone today to offer care coordination services.      Mr. Todorov was given information about Care Coordination services today including:  The Care Coordination services include support from the care team which includes your Nurse Coordinator, Clinical Social Worker, or Pharmacist.  The Care Coordination team is here to help remove barriers to the health concerns and goals most important to you. Care Coordination services are voluntary and the patient may decline or stop services at any time by request to their care team member.   Patient agreed to services and verbal consent obtained.   Follow up plan: Telephone appointment with care coordination team member scheduled for: 08/24/2021  Julian Hy, [place] Direct Dial: [phone]

## 2021-08-24 ENCOUNTER — Ambulatory Visit: Payer: Self-pay

## 2021-08-24 NOTE — Patient Instructions (Signed)
Visit Information  Thank you for taking time to visit with me today. Please don't hesitate to contact me if I can be of assistance to you.   Following are the goals we discussed today:   Goals Addressed             This Visit's Progress    Patient states he would like to control his back pain       Care Coordination Interventions: Evaluation of current treatment plan related to back pain and patient's adherence to plan as established by provider Advised patient to consider re implementing stretching exercises shown/ taught to him in previous physical therapy sessions. Reviewed medications with patient and discussed importance of compliance Reviewed scheduled/upcoming provider appointments  Advised to use ice and/ or heat to help with managing pain/ discomfort.  Advised to follow up with provider for increased / ongoing symptoms.            If you are experiencing a Mental Health or [place] or need someone to talk to, please call the Suicide and Crisis Lifeline: 988 call 1-800-273-TALK (toll free, 24 hour hotline)  Patient verbalizes understanding of instructions and care plan provided today and agrees to view in [place]. Active MyChart status and patient understanding of how to access instructions and care plan via MyChart confirmed with patient.     No further follow up required:    Quinn Plowman [facility] [place] Coordinator [phone]

## 2021-08-24 NOTE — Patient Outreach (Signed)
  Care Coordination   Initial Visit Note   08/24/2021 Name: Terris Germano MRN: [id number] DOB: [birth year]  Mannix Kroeker is a 66 y.o. year old male who sees [name], MD for primary care. I spoke with  Jodelle Red by phone today  What matters to the patients health and wellness today?  Controlling back pain   Goals Addressed             This Visit's Progress    Patient states he would like to control his back pain       Care Coordination Interventions: Evaluation of current treatment plan related to back pain and patient's adherence to plan as established by provider Advised patient to consider re implementing stretching exercises shown/ taught to him in previous physical therapy sessions. Reviewed medications with patient and discussed importance of compliance Reviewed scheduled/upcoming provider appointments  Advised to use ice and/ or heat to help with managing pain/ discomfort.  Advised to follow up with provider for increased / ongoing symptoms.           SDOH assessments and interventions completed:   Yes SDOH Interventions Today    Flowsheet Row Most Recent Value  SDOH Interventions   Food Insecurity Interventions Intervention Not Indicated  Housing Interventions Intervention Not Indicated  Transportation Interventions Intervention Not Indicated       Care Coordination Interventions Activated:  Yes Care Coordination Interventions:  Yes, provided  Follow up plan: No further intervention required.  Encounter Outcome:  Pt. Visit Completed  Quinn Plowman RN,BSN,CCM RN Care Manager Coordinator [place]  [phone]

## 2021-09-02 ENCOUNTER — Telehealth: Payer: Self-pay | Admitting: Internal Medicine

## 2021-09-02 DIAGNOSIS — E7800 Pure hypercholesterolemia, unspecified: Secondary | ICD-10-CM

## 2021-09-02 DIAGNOSIS — I10 Essential (primary) hypertension: Secondary | ICD-10-CM

## 2021-09-02 DIAGNOSIS — R739 Hyperglycemia, unspecified: Secondary | ICD-10-CM

## 2021-09-02 NOTE — Telephone Encounter (Signed)
Patient has a lab appt 09/08/21, there are no orders in.

## 2021-09-03 NOTE — Telephone Encounter (Signed)
Orders placed for labs

## 2021-09-03 NOTE — Addendum Note (Signed)
Addended by: Alisa Graff on: 09/03/2021 04:56 AM   Modules accepted: Orders

## 2021-09-08 ENCOUNTER — Other Ambulatory Visit (INDEPENDENT_AMBULATORY_CARE_PROVIDER_SITE_OTHER): Payer: 59

## 2021-09-08 DIAGNOSIS — I10 Essential (primary) hypertension: Secondary | ICD-10-CM | POA: Diagnosis not present

## 2021-09-08 DIAGNOSIS — E7800 Pure hypercholesterolemia, unspecified: Secondary | ICD-10-CM

## 2021-09-08 DIAGNOSIS — R739 Hyperglycemia, unspecified: Secondary | ICD-10-CM | POA: Diagnosis not present

## 2021-09-08 LAB — HEMOGLOBIN A1C: Hgb A1c MFr Bld: 5.7 % (ref 4.6–6.5)

## 2021-09-08 LAB — BASIC METABOLIC PANEL
BUN: 16 mg/dL (ref 6–23)
CO2: 31 mEq/L (ref 19–32)
Calcium: 8.8 mg/dL (ref 8.4–10.5)
Chloride: 103 mEq/L (ref 96–112)
Creatinine, Ser: 1.04 mg/dL (ref 0.40–1.50)
GFR: 75.98 mL/min (ref >60.00)
Glucose, Bld: 87 mg/dL (ref 70–99)
Potassium: 3.7 mEq/L (ref 3.5–5.1)
Sodium: 141 mEq/L (ref 135–145)

## 2021-09-08 LAB — HEPATIC FUNCTION PANEL
ALT: 13 U/L (ref 0–53)
AST: 17 U/L (ref 0–37)
Albumin: 4.2 g/dL (ref 3.5–5.2)
Alkaline Phosphatase: 59 U/L (ref 39–117)
Bilirubin, Direct: 0.1 mg/dL (ref 0.0–0.3)
Total Bilirubin: 0.8 mg/dL (ref 0.2–1.2)
Total Protein: 6.2 g/dL (ref 6.0–8.3)

## 2021-09-08 LAB — LIPID PANEL
Cholesterol: 142 mg/dL (ref 0–200)
HDL: 28.6 mg/dL — ABNORMAL LOW (ref >39.00)
NonHDL: 113.19
Total CHOL/HDL Ratio: 5
Triglycerides: 271 mg/dL — ABNORMAL HIGH (ref 0.0–149.0)
VLDL: 54.2 mg/dL — ABNORMAL HIGH (ref 0.0–40.0)

## 2021-09-08 LAB — LDL CHOLESTEROL, DIRECT: Direct LDL: 61 mg/dL

## 2021-09-13 ENCOUNTER — Ambulatory Visit: Payer: BC Managed Care – PPO | Admitting: Internal Medicine

## 2021-09-30 DIAGNOSIS — H40003 Preglaucoma, unspecified, bilateral: Secondary | ICD-10-CM | POA: Diagnosis not present

## 2021-10-08 ENCOUNTER — Ambulatory Visit (INDEPENDENT_AMBULATORY_CARE_PROVIDER_SITE_OTHER): Payer: 59 | Admitting: Internal Medicine

## 2021-10-08 ENCOUNTER — Encounter: Payer: Self-pay | Admitting: Internal Medicine

## 2021-10-08 VITALS — BP 116/74 | HR 70 | Temp 97.4°F | Ht 70.0 in | Wt 170.2 lb

## 2021-10-08 DIAGNOSIS — Z720 Tobacco use: Secondary | ICD-10-CM | POA: Diagnosis not present

## 2021-10-08 DIAGNOSIS — I739 Peripheral vascular disease, unspecified: Secondary | ICD-10-CM | POA: Diagnosis not present

## 2021-10-08 DIAGNOSIS — I700 Atherosclerosis of aorta: Secondary | ICD-10-CM

## 2021-10-08 DIAGNOSIS — Z86010 Personal history of colonic polyps: Secondary | ICD-10-CM

## 2021-10-08 DIAGNOSIS — Z125 Encounter for screening for malignant neoplasm of prostate: Secondary | ICD-10-CM | POA: Diagnosis not present

## 2021-10-08 DIAGNOSIS — R739 Hyperglycemia, unspecified: Secondary | ICD-10-CM

## 2021-10-08 DIAGNOSIS — E7800 Pure hypercholesterolemia, unspecified: Secondary | ICD-10-CM | POA: Diagnosis not present

## 2021-10-08 DIAGNOSIS — I10 Essential (primary) hypertension: Secondary | ICD-10-CM

## 2021-10-08 DIAGNOSIS — K219 Gastro-esophageal reflux disease without esophagitis: Secondary | ICD-10-CM

## 2021-10-08 DIAGNOSIS — R911 Solitary pulmonary nodule: Secondary | ICD-10-CM

## 2021-10-08 NOTE — Progress Notes (Unsigned)
Patient ID: Justin Meisenheimer, male   DOB: Nov 12, 1955, 66 y.o.   MRN: [id number]   Subjective:    Patient ID: Felder Lebeda, male    DOB: Apr 01, 1955, 66 y.o.   MRN: [id number]   Patient here for  Chief Complaint  Patient presents with   Follow-up    4 month follow up   .   HPI Here to follow up regarding hypercholesterolemia and hypertension.  States he is doing well. Feels good.  No chest pain or sob reported.  No increased cough or congestion.  No acid reflux.  No abdominal pain.  Bowels moving.  Saw ophthalmology two weeks ago.  States eyes checked out find.  Seeing dermatology - skin cancers removed - right ear/face.  Seeing urology.  Doing well. Is smoking.  Discussed the need to quit.    Past Medical History:  Diagnosis Date   Chronic headaches    migraines, 3-4x/yr   Degenerative disc disease, cervical    Genetic testing 03/20/2017   Multi-Cancer panel (83 genes) @ [facility] - No pathogenic mutations detected   GERD (gastroesophageal reflux disease)    Hypercholesterolemia    Hypertension    Obesity    Peripheral vascular disease (HCC)    groin and right leg stents   Skin cancer    arm/ face area.  Has had removed   Sleep apnea    CPAP   Tobacco abuse    Past Surgical History:  Procedure Laterality Date   CARDIAC CATHETERIZATION     CATARACT EXTRACTION W/PHACO Left 04/11/2018   Procedure: CATARACT EXTRACTION PHACO AND INTRAOCULAR LENS PLACEMENT ([facility])  COMPLICATED LEFT;  Surgeon: Leandrew Koyanagi, MD;  Location: [facility];  Service: Ophthalmology;  Laterality: Left;  OMIDRIA sleep apnea   COLONOSCOPY WITH PROPOFOL N/A 11/25/2016   Procedure: COLONOSCOPY WITH PROPOFOL;  Surgeon: Toledo, [name], MD;  Location: [facility];  Service: Endoscopy;  Laterality: N/A;   ESOPHAGOGASTRODUODENOSCOPY (EGD) WITH PROPOFOL N/A 11/25/2016   Procedure: ESOPHAGOGASTRODUODENOSCOPY (EGD) WITH PROPOFOL;  Surgeon: Toledo, [name], MD;  Location: [facility];   Service: Endoscopy;  Laterality: N/A;   HERNIA REPAIR     Inguinal,laparoscopic surgery   HOLEP-LASER ENUCLEATION OF THE PROSTATE WITH MORCELLATION N/A 03/19/2021   Procedure: HOLEP-LASER ENUCLEATION OF THE PROSTATE WITH MORCELLATION;  Surgeon: Billey Co, MD;  Location: [facility];  Service: Urology;  Laterality: N/A;   PERIPHERAL VASCULAR CATHETERIZATION Right 05/21/2015   Procedure: Lower Extremity Angiography;  Surgeon: Algernon Huxley, MD;  Location: [place] [facility];  Service: Cardiovascular;  Laterality: Right;   PERIPHERAL VASCULAR CATHETERIZATION  05/21/2015   Procedure: Lower Extremity Intervention;  Surgeon: Algernon Huxley, MD;  Location: [place];  Service: Cardiovascular;;   radio-ablated therapy     completed in the pain clinic   RETINAL DETACHMENT SURGERY     on right eye x 2   Family History  Problem Relation Age of Onset   Hypertension Mother    Breast cancer Mother 31   Uterine cancer Mother        unconfirmed; dx 58s; TAH/BSO; currently 40   Hyperlipidemia Father    Bladder Cancer Paternal Grandfather 62       smoker; deceased 54   Diabetes Maternal Grandmother    Non-Hodgkin's lymphoma Paternal Aunt        deceased 15   Social History   Socioeconomic History   Marital status: Married    Spouse name: Not on file  Number of children: Not on file   Years of education: Not on file   Highest education level: Not on file  Occupational History   Not on file  Tobacco Use   Smoking status: Some Days    Packs/day: 0.25    Years: 37.00    Total pack years: 9.25    Types: Cigarettes   Smokeless tobacco: Never  Vaping Use   Vaping Use: Never used  Substance and Sexual Activity   Alcohol use: Yes    Alcohol/week: 0.0 standard drinks of alcohol    Comment: very occasional alcohol use - 3-4x/yr   Drug use: No   Sexual activity: Not on file  Other Topics Concern   Not on file  Social History Narrative   Not on file   Social Determinants of  Health   Financial Resource Strain: Not on file  Food Insecurity: No Food Insecurity (08/24/2021)   Hunger Vital Sign    Worried About Running Out of Food in the Last Year: Never true    Ran Out of Food in the Last Year: Never true  Transportation Needs: No Transportation Needs (08/24/2021)   PRAPARE - Hydrologist (Medical): No    Lack of Transportation (Non-Medical): No  Physical Activity: Not on file  Stress: Not on file  Social Connections: Not on file     Review of Systems  Constitutional:  Negative for appetite change and unexpected weight change.  HENT:  Negative for congestion and sinus pressure.   Respiratory:  Negative for cough, chest tightness and shortness of breath.   Cardiovascular:  Negative for chest pain, palpitations and leg swelling.  Gastrointestinal:  Negative for abdominal pain, diarrhea, nausea and vomiting.  Genitourinary:  Negative for difficulty urinating and dysuria.  Musculoskeletal:  Negative for joint swelling and myalgias.  Skin:  Negative for color change and rash.  Neurological:  Negative for dizziness, light-headedness and headaches.  Psychiatric/Behavioral:  Negative for agitation and dysphoric mood.        Objective:     BP 116/74 (BP Location: Left Arm, Patient Position: Sitting, Cuff Size: Normal)   Pulse 70   Temp (!) 97.4 F (36.3 C) (Oral)   Ht 5' 10"  (1.778 m)   Wt 170 lb 3.2 oz (77.2 kg)   SpO2 98%   BMI 24.42 kg/m  Wt Readings from Last 3 Encounters:  10/08/21 170 lb 3.2 oz (77.2 kg)  06/23/21 169 lb (76.7 kg)  05/13/21 169 lb 9.6 oz (76.9 kg)    Physical Exam Constitutional:      General: He is not in acute distress.    Appearance: Normal appearance. He is well-developed.  HENT:     Head: Normocephalic and atraumatic.     Right Ear: External ear normal.     Left Ear: External ear normal.  Eyes:     General: No scleral icterus.       Right eye: No discharge.        Left eye: No discharge.   Cardiovascular:     Rate and Rhythm: Normal rate and regular rhythm.  Pulmonary:     Effort: Pulmonary effort is normal. No respiratory distress.     Breath sounds: Normal breath sounds.  Abdominal:     General: Bowel sounds are normal.     Palpations: Abdomen is soft.     Tenderness: There is no abdominal tenderness.  Musculoskeletal:        General: No swelling or tenderness.  Cervical back: Neck supple. No tenderness.  Lymphadenopathy:     Cervical: No cervical adenopathy.  Skin:    Findings: No erythema or rash.  Neurological:     Mental Status: He is alert.  Psychiatric:        Mood and Affect: Mood normal.        Behavior: Behavior normal.      Outpatient Encounter Medications as of 10/08/2021  Medication Sig   amitriptyline (ELAVIL) 25 MG tablet TAKE 1 TABLET BY MOUTH AT BEDTIME   atorvastatin (LIPITOR) 20 MG tablet TAKE 1 TABLET BY MOUTH AT BEDTIME   clopidogrel (PLAVIX) 75 MG tablet TAKE 1 TABLET BY MOUTH ONCE DAILY   DULoxetine (CYMBALTA) 30 MG capsule TAKE 1 CAPSULE BY MOUTH ONCE DAILY   hydrochlorothiazide (HYDRODIURIL) 25 MG tablet TAKE 1 TABLET BY MOUTH ONCE DAILY   losartan (COZAAR) 100 MG tablet TAKE 1 TABLET BY MOUTH ONCE DAILY   metoprolol succinate (TOPROL-XL) 50 MG 24 hr tablet TAKE 1 TABLET BY MOUTH ONCE DAILY (Patient taking differently: 25 mg daily.)   omega-3 acid ethyl esters (LOVAZA) 1 g capsule Take 1 capsule (1 g total) by mouth daily. (Patient taking differently: Take 2 g by mouth daily.)   pantoprazole (PROTONIX) 40 MG tablet TAKE 1 TABLET BY MOUTH ONCE DAILY   vitamin B-12 (CYANOCOBALAMIN) 1000 MCG tablet Take 1,000 mcg by mouth daily.   No facility-administered encounter medications on file as of 10/08/2021.     Lab Results  Component Value Date   WBC 5.4 02/16/2021   HGB 14.1 02/16/2021   HCT 41.1 02/16/2021   PLT 213 02/16/2021   GLUCOSE 87 09/08/2021   CHOL 142 09/08/2021   TRIG 271.0 (H) 09/08/2021   HDL 28.60 (L) 09/08/2021    LDLDIRECT 61.0 09/08/2021   LDLCALC 60 04/24/2013   ALT 13 09/08/2021   AST 17 09/08/2021   NA 141 09/08/2021   K 3.7 09/08/2021   CL 103 09/08/2021   CREATININE 1.04 09/08/2021   BUN 16 09/08/2021   CO2 31 09/08/2021   TSH 3.69 10/29/2020   PSA 1.74 02/09/2021   INR 1.09 05/24/2015   HGBA1C 5.7 09/08/2021    CT CHEST LUNG CA SCREEN LOW DOSE W/O CM  Result Date: 04/27/2021 CLINICAL DATA:  Current smoker with 37 pack-year history EXAM: CT CHEST WITHOUT CONTRAST LOW-DOSE FOR LUNG CANCER SCREENING TECHNIQUE: Multidetector CT imaging of the chest was performed following the standard protocol without IV contrast. RADIATION DOSE REDUCTION: This exam was performed according to the departmental dose-optimization program which includes automated exposure control, adjustment of the mA and/or kV according to patient size and/or use of iterative reconstruction technique. COMPARISON:  CT lung cancer screening dated December 12, 2019 FINDINGS: Cardiovascular: Normal heart size. No pericardial effusion. Mild coronary artery calcifications. Mild calcified plaque of the thoracic aorta. Mediastinum/Nodes: Esophagus and thyroid are unremarkable. No pathologically enlarged lymph nodes seen in the chest. Lungs/Pleura: Central airways are patent. Paraseptal and centrilobular emphysema. No consolidation, pleural effusion or pneumothorax. Stable bilateral solid pulmonary nodules. Largest is located in the right upper lobe and measures 7.8 mm in mean diameter on image 104. Upper Abdomen: No acute abnormality. Musculoskeletal: No chest wall mass or suspicious bone lesions identified. IMPRESSION: 1. Lung-RADS 2, benign appearance or behavior. Continue annual screening with low-dose chest CT without contrast in 12 months. 2. Aortic Atherosclerosis (ICD10-I70.0) and Emphysema (ICD10-J43.9). Electronically Signed   By: Yetta Glassman [name]   On: 04/27/2021 17:10  Assessment & Plan:   Problem List Items Addressed This  Visit     Aortic atherosclerosis ([place])    Continue lipitor.       Essential hypertension, benign    Pressure as outlined.  Doing well.  Continue losartan and hctz.  Follow pressures.  Follow metabolic panel.  No changes.       Relevant Orders   Basic metabolic panel   GERD (gastroesophageal reflux disease)    No upper symptoms reported.  On protonix.       History of colonic polyps    Colonoscopy 11/2016 - rectal polyp - tubular adenoma.  Recommended f/u colonoscopy in 5 years. (Dr Alice Reichert).  Discussed due this year.       Hypercholesterolemia    Continue lipitor.  Low cholesterol diet and exercise as tolerated.  Improved.  Lab Results  Component Value Date   CHOL 142 09/08/2021   HDL 28.60 (L) 09/08/2021   LDLCALC 60 04/24/2013   LDLDIRECT 61.0 09/08/2021   TRIG 271.0 (H) 09/08/2021   CHOLHDL 5 09/08/2021       Relevant Orders   CBC with Differential/Platelet   Hepatic function panel   Lipid panel   TSH   Hyperglycemia    Low carb diet and exercise.  Follow met b and a1c.       Relevant Orders   Hemoglobin A1c   Lung nodule    CT chest 04/27/21 - Lung-RADS 2, benign appearance or behavior. Continue annual screening with low-dose chest CT without contrast in 12 months.      Peripheral vascular disease ([place])    Evaluated by Dr Lucky Cowboy 02/02/21.  ABIs .94 right and .96 left - normal pressures and waveforms bilaterally.  Over 5 years s/p intervention.  Recommended continuing annual checks.        Tobacco abuse    Smoking a few cigarettes occasionally.  Have discussed the need to stop.  Follow.       Other Visit Diagnoses     Prostate cancer screening    -  Primary   Relevant Orders   PSA        [name], MD

## 2021-10-10 ENCOUNTER — Encounter: Payer: Self-pay | Admitting: Internal Medicine

## 2021-10-10 NOTE — Assessment & Plan Note (Signed)
Low carb diet and exercise.  Follow met b and a1c.  

## 2021-10-10 NOTE — Assessment & Plan Note (Signed)
No upper symptoms reported.  On protonix.   

## 2021-10-10 NOTE — Assessment & Plan Note (Signed)
CT chest 04/27/21 - Lung-RADS 2, benign appearance or behavior. Continue annual screening with low-dose chest CT without contrast in 12 months.

## 2021-10-10 NOTE — Assessment & Plan Note (Signed)
Continue lipitor  ?

## 2021-10-10 NOTE — Assessment & Plan Note (Signed)
Continue lipitor.  Low cholesterol diet and exercise as tolerated.  Improved.  Lab Results  Component Value Date   CHOL 142 09/08/2021   HDL 28.60 (L) 09/08/2021   LDLCALC 60 04/24/2013   LDLDIRECT 61.0 09/08/2021   TRIG 271.0 (H) 09/08/2021   CHOLHDL 5 09/08/2021

## 2021-10-10 NOTE — Assessment & Plan Note (Signed)
Colonoscopy 11/2016 - rectal polyp - tubular adenoma.  Recommended f/u colonoscopy in 5 years. (Dr Alice Reichert).  Discussed due this year.

## 2021-10-10 NOTE — Assessment & Plan Note (Signed)
Pressure as outlined.  Doing well.  Continue losartan and hctz.  Follow pressures.  Follow metabolic panel.  No changes.

## 2021-10-10 NOTE — Assessment & Plan Note (Signed)
Smoking a few cigarettes occasionally.  Have discussed the need to stop.  Follow.

## 2021-10-10 NOTE — Assessment & Plan Note (Signed)
Evaluated by Dr Lucky Cowboy 02/02/21.  ABIs .94 right and .96 left - normal pressures and waveforms bilaterally.  Over 5 years s/p intervention.  Recommended continuing annual checks.

## 2021-10-19 ENCOUNTER — Other Ambulatory Visit: Payer: Self-pay | Admitting: Internal Medicine

## 2021-10-19 ENCOUNTER — Encounter: Payer: Self-pay | Admitting: Internal Medicine

## 2021-10-28 ENCOUNTER — Telehealth: Payer: Self-pay

## 2021-10-28 ENCOUNTER — Other Ambulatory Visit: Payer: Self-pay

## 2021-10-28 NOTE — Telephone Encounter (Signed)
Called Patient regarding Toprol 25 mg. Patient stated please send in Toprol 25 mg . Order is pended.

## 2021-10-29 ENCOUNTER — Other Ambulatory Visit: Payer: Self-pay | Admitting: Internal Medicine

## 2021-11-08 ENCOUNTER — Other Ambulatory Visit: Payer: Self-pay | Admitting: Internal Medicine

## 2021-11-08 DIAGNOSIS — I10 Essential (primary) hypertension: Secondary | ICD-10-CM

## 2021-11-18 DIAGNOSIS — Z85828 Personal history of other malignant neoplasm of skin: Secondary | ICD-10-CM | POA: Diagnosis not present

## 2021-11-18 DIAGNOSIS — L570 Actinic keratosis: Secondary | ICD-10-CM | POA: Diagnosis not present

## 2021-11-18 DIAGNOSIS — L578 Other skin changes due to chronic exposure to nonionizing radiation: Secondary | ICD-10-CM | POA: Diagnosis not present

## 2021-11-18 DIAGNOSIS — D492 Neoplasm of unspecified behavior of bone, soft tissue, and skin: Secondary | ICD-10-CM | POA: Diagnosis not present

## 2021-11-18 DIAGNOSIS — C44612 Basal cell carcinoma of skin of right upper limb, including shoulder: Secondary | ICD-10-CM | POA: Diagnosis not present

## 2021-11-22 ENCOUNTER — Encounter (INDEPENDENT_AMBULATORY_CARE_PROVIDER_SITE_OTHER): Payer: Self-pay

## 2021-11-23 DIAGNOSIS — H35372 Puckering of macula, left eye: Secondary | ICD-10-CM | POA: Diagnosis not present

## 2021-11-23 DIAGNOSIS — H40003 Preglaucoma, unspecified, bilateral: Secondary | ICD-10-CM | POA: Diagnosis not present

## 2021-11-25 ENCOUNTER — Telehealth: Payer: Self-pay

## 2021-11-25 NOTE — Telephone Encounter (Signed)
Chistian Mcbain Key: [id number] - PA Case ID: [id number] - Rx #: [id number] Need help? Call us at [phone] Status Sent to [facility] Losartan Potassium '100MG'$  tablets Form [employer] Electronic PA Form [phone] NCPDP) Original Claim Info 76,19 01MAXIMUM DAYS SUPPLY OF 30 02(PHARMACY HELP DESK [phone])

## 2021-11-30 ENCOUNTER — Encounter: Payer: Self-pay | Admitting: Internal Medicine

## 2021-11-30 ENCOUNTER — Other Ambulatory Visit: Payer: Self-pay

## 2021-11-30 MED ORDER — METOPROLOL SUCCINATE ER 25 MG PO TB24: 25.0000 mg | ORAL_TABLET | Freq: Every day | ORAL | 3 refills | Status: DC

## 2021-12-01 NOTE — Telephone Encounter (Signed)
Kinsley Colonna Key: [id number] - PA Case ID: [id number] - Rx #: [id number] Need help? Call us at [phone] Outcome N/Aon November 2 Your PA request has been closed. Your ePA request has been aborted. No PA required. Refill too soon.

## 2021-12-20 ENCOUNTER — Other Ambulatory Visit: Payer: Self-pay | Admitting: Internal Medicine

## 2021-12-30 DIAGNOSIS — D492 Neoplasm of unspecified behavior of bone, soft tissue, and skin: Secondary | ICD-10-CM | POA: Diagnosis not present

## 2021-12-30 DIAGNOSIS — C44319 Basal cell carcinoma of skin of other parts of face: Secondary | ICD-10-CM | POA: Diagnosis not present

## 2021-12-30 DIAGNOSIS — C44612 Basal cell carcinoma of skin of right upper limb, including shoulder: Secondary | ICD-10-CM | POA: Diagnosis not present

## 2022-01-18 ENCOUNTER — Encounter: Payer: Self-pay | Admitting: Internal Medicine

## 2022-01-26 ENCOUNTER — Other Ambulatory Visit: Payer: Self-pay | Admitting: Internal Medicine

## 2022-02-02 ENCOUNTER — Other Ambulatory Visit (INDEPENDENT_AMBULATORY_CARE_PROVIDER_SITE_OTHER): Payer: Self-pay | Admitting: Vascular Surgery

## 2022-02-02 DIAGNOSIS — I739 Peripheral vascular disease, unspecified: Secondary | ICD-10-CM

## 2022-02-04 ENCOUNTER — Ambulatory Visit (INDEPENDENT_AMBULATORY_CARE_PROVIDER_SITE_OTHER): Payer: 59

## 2022-02-04 ENCOUNTER — Ambulatory Visit (INDEPENDENT_AMBULATORY_CARE_PROVIDER_SITE_OTHER): Payer: 59 | Admitting: Vascular Surgery

## 2022-02-04 ENCOUNTER — Encounter (INDEPENDENT_AMBULATORY_CARE_PROVIDER_SITE_OTHER): Payer: Self-pay | Admitting: Vascular Surgery

## 2022-02-04 VITALS — BP 136/85 | HR 59 | Ht 70.0 in | Wt 170.0 lb

## 2022-02-04 DIAGNOSIS — I10 Essential (primary) hypertension: Secondary | ICD-10-CM

## 2022-02-04 DIAGNOSIS — I739 Peripheral vascular disease, unspecified: Secondary | ICD-10-CM

## 2022-02-04 DIAGNOSIS — E7800 Pure hypercholesterolemia, unspecified: Secondary | ICD-10-CM | POA: Diagnosis not present

## 2022-02-04 NOTE — Progress Notes (Signed)
MRN : [id number]  Travis Gray is a 67 y.o. (22-Nov-1955) male who presents with chief complaint of  Chief Complaint  Patient presents with   Follow-up    1 year follow up with abi  .  History of Present Illness: Patient returns today in follow up of his PAD. His PAD.  He is now almost 7 years status post bilateral iliac stent placement as well as right infrainguinal revascularization.  He is doing well.  He really has minimal symptoms at this point.  No rest pain or ulceration.  ABIs today 1.15 bilaterally with triphasic waveforms and normal digital pressures consistent with no arterial insufficiency after previous revascularization.  Current Outpatient Medications  Medication Sig Dispense Refill   amitriptyline (ELAVIL) 25 MG tablet TAKE 1 TABLET BY MOUTH AT BEDTIME 30 tablet 5   atorvastatin (LIPITOR) 20 MG tablet TAKE 1 TABLET BY MOUTH AT BEDTIME 90 tablet 1   clopidogrel (PLAVIX) 75 MG tablet TAKE 1 TABLET BY MOUTH ONCE DAILY 30 tablet 11   DULoxetine (CYMBALTA) 30 MG capsule TAKE 1 CAPSULE BY MOUTH ONCE DAILY 90 capsule 1   hydrochlorothiazide (HYDRODIURIL) 25 MG tablet TAKE 1 TABLET BY MOUTH ONCE DAILY 90 tablet 1   losartan (COZAAR) 100 MG tablet TAKE 1 TABLET BY MOUTH ONCE DAILY 90 tablet 3   metoprolol succinate (TOPROL-XL) 25 MG 24 hr tablet Take 1 tablet (25 mg total) by mouth daily. 90 tablet 3   omega-3 acid ethyl esters (LOVAZA) 1 g capsule Take 1 capsule (1 g total) by mouth daily. (Patient taking differently: Take 2 g by mouth daily.) 90 capsule 3   pantoprazole (PROTONIX) 40 MG tablet TAKE 1 TABLET BY MOUTH ONCE DAILY 90 tablet 1   vitamin B-12 (CYANOCOBALAMIN) 1000 MCG tablet Take 1,000 mcg by mouth daily.     No current facility-administered medications for this visit.    Past Medical History:  Diagnosis Date   Chronic headaches    migraines, 3-4x/yr   Degenerative disc disease, cervical    Genetic testing 03/20/2017   Multi-Cancer panel (83 genes) @  [facility] - No pathogenic mutations detected   GERD (gastroesophageal reflux disease)    Hypercholesterolemia    Hypertension    Obesity    Peripheral vascular disease (HCC)    groin and right leg stents   Skin cancer    arm/ face area.  Has had removed   Sleep apnea    CPAP   Tobacco abuse     Past Surgical History:  Procedure Laterality Date   CARDIAC CATHETERIZATION     CATARACT EXTRACTION W/PHACO Left 04/11/2018   Procedure: CATARACT EXTRACTION PHACO AND INTRAOCULAR LENS PLACEMENT ([place])  COMPLICATED LEFT;  Surgeon: Leandrew Koyanagi, MD;  Location: [facility];  Service: Ophthalmology;  Laterality: Left;  OMIDRIA sleep apnea   COLONOSCOPY WITH PROPOFOL N/A 11/25/2016   Procedure: COLONOSCOPY WITH PROPOFOL;  Surgeon: Toledo, [name], MD;  Location: [facility];  Service: Endoscopy;  Laterality: N/A;   ESOPHAGOGASTRODUODENOSCOPY (EGD) WITH PROPOFOL N/A 11/25/2016   Procedure: ESOPHAGOGASTRODUODENOSCOPY (EGD) WITH PROPOFOL;  Surgeon: Toledo, [name], MD;  Location: [facility];  Service: Endoscopy;  Laterality: N/A;   HERNIA REPAIR     Inguinal,laparoscopic surgery   HOLEP-LASER ENUCLEATION OF THE PROSTATE WITH MORCELLATION N/A 03/19/2021   Procedure: HOLEP-LASER ENUCLEATION OF THE PROSTATE WITH MORCELLATION;  Surgeon: Billey Co, MD;  Location: [facility];  Service: Urology;  Laterality: N/A;   PERIPHERAL VASCULAR CATHETERIZATION Right 05/21/2015  Procedure: Lower Extremity Angiography;  Surgeon: Algernon Huxley, MD;  Location: [facility];  Service: Cardiovascular;  Laterality: Right;   PERIPHERAL VASCULAR CATHETERIZATION  05/21/2015   Procedure: Lower Extremity Intervention;  Surgeon: Algernon Huxley, MD;  Location: [facility];  Service: Cardiovascular;;   radio-ablated therapy     completed in the pain clinic   RETINAL DETACHMENT SURGERY     on right eye x 2     Social History   Tobacco Use   Smoking status: Some Days    Packs/day:  0.25    Years: 37.00    Total pack years: 9.25    Types: Cigarettes   Smokeless tobacco: Never  Vaping Use   Vaping Use: Never used  Substance Use Topics   Alcohol use: Yes    Alcohol/week: 0.0 standard drinks of alcohol    Comment: very occasional alcohol use - 3-4x/yr   Drug use: No       Family History  Problem Relation Age of Onset   Hypertension Mother    Breast cancer Mother 18   Uterine cancer Mother        unconfirmed; dx 70s; TAH/BSO; currently 56   Hyperlipidemia Father    Bladder Cancer Paternal Grandfather 88       smoker; deceased 60   Diabetes Maternal Grandmother    Non-Hodgkin's lymphoma Paternal Aunt        deceased 25     No Known Allergies   REVIEW OF SYSTEMS (Negative unless checked)  Constitutional: '[]'$ Weight loss  '[]'$ Fever  '[]'$ Chills Cardiac: '[]'$ Chest pain   '[]'$ Chest pressure   '[]'$ Palpitations   '[]'$ Shortness of breath when laying flat   '[]'$ Shortness of breath at rest   '[]'$ Shortness of breath with exertion. Vascular:  '[x]'$ Pain in legs with walking   '[]'$ Pain in legs at rest   '[]'$ Pain in legs when laying flat   '[]'$ Claudication   '[]'$ Pain in feet when walking  '[]'$ Pain in feet at rest  '[]'$ Pain in feet when laying flat   '[]'$ History of DVT   '[]'$ Phlebitis   '[]'$ Swelling in legs   '[]'$ Varicose veins   '[]'$ Non-healing ulcers Pulmonary:   '[]'$ Uses home oxygen   '[]'$ Productive cough   '[]'$ Hemoptysis   '[]'$ Wheeze  '[]'$ COPD   '[]'$ Asthma Neurologic:  '[]'$ Dizziness  '[]'$ Blackouts   '[]'$ Seizures   '[]'$ History of stroke   '[]'$ History of TIA  '[]'$ Aphasia   '[]'$ Temporary blindness   '[]'$ Dysphagia   '[]'$ Weakness or numbness in arms   '[]'$ Weakness or numbness in legs Musculoskeletal:  '[x]'$ Arthritis   '[]'$ Joint swelling   '[]'$ Joint pain   '[]'$ Low back pain Hematologic:  '[]'$ Easy bruising  '[]'$ Easy bleeding   '[]'$ Hypercoagulable state   '[]'$ Anemic   Gastrointestinal:  '[]'$ Blood in stool   '[]'$ Vomiting blood  '[]'$ Gastroesophageal reflux/heartburn   '[]'$ Abdominal pain Genitourinary:  '[]'$ Chronic kidney disease   '[]'$ Difficult urination  '[]'$ Frequent urination   '[]'$ Burning with urination   '[]'$ Hematuria Skin:  '[]'$ Rashes   '[]'$ Ulcers   '[]'$ Wounds Psychological:  '[]'$ History of anxiety   '[]'$  History of major depression.  Physical Examination  BP 136/85   Pulse (!) 59   Ht '5\' 10"'$  (1.778 m)   Wt 170 lb (77.1 kg)   BMI 24.39 kg/m  Gen:  WD/WN, NAD Head: [place]/AT, No temporalis wasting. Ear/Nose/Throat: Hearing grossly intact, nares w/o erythema or drainage Eyes: Conjunctiva clear. Sclera non-icteric Neck: Supple.  Trachea midline Pulmonary:  Good air movement, no use of accessory muscles.  Cardiac: RRR, no JVD Vascular:  Vessel Right Left  Radial Palpable Palpable                          PT Palpable Palpable  DP Palpable Palpable   Gastrointestinal: soft, non-tender/non-distended. No guarding/reflex.  Musculoskeletal: M/S 5/5 throughout.  No deformity or atrophy. No edema. Neurologic: Sensation grossly intact in extremities.  Symmetrical.  Speech is fluent.  Psychiatric: Judgment intact, Mood & affect appropriate for pt's clinical situation. Dermatologic: No rashes or ulcers noted.  No cellulitis or open wounds.    Labs No results found for this or any previous visit (from the past 2160 hour(s)).  Radiology No results found.  Assessment/Plan Essential hypertension, benign blood pressure control important in reducing the progression of atherosclerotic disease. On appropriate oral medications.     Pure hypercholesterolemia lipid control important in reducing the progression of atherosclerotic disease. Continue statin therapy  Peripheral vascular disease (HCC) ABIs today 1.15 bilaterally with triphasic waveforms and normal digital pressures consistent with no arterial insufficiency after previous revascularization.  Continue Plavix and Lipitor.  Doing well after previous revascularization.  Recheck in 1 year.    Leotis Pain, MD  02/04/2022 12:11 PM    This note was created with Dragon medical transcription system.  Any errors from  dictation are purely unintentional

## 2022-02-04 NOTE — Assessment & Plan Note (Signed)
ABIs today 1.15 bilaterally with triphasic waveforms and normal digital pressures consistent with no arterial insufficiency after previous revascularization.  Continue Plavix and Lipitor.  Doing well after previous revascularization.  Recheck in 1 year.

## 2022-02-07 LAB — VAS US ABI WITH/WO TBI
Left ABI: 1.15
Right ABI: 1.15

## 2022-02-17 ENCOUNTER — Other Ambulatory Visit: Payer: Self-pay | Admitting: Internal Medicine

## 2022-03-29 ENCOUNTER — Encounter: Payer: Self-pay | Admitting: Urology

## 2022-03-29 ENCOUNTER — Telehealth: Payer: Self-pay

## 2022-03-29 ENCOUNTER — Ambulatory Visit (INDEPENDENT_AMBULATORY_CARE_PROVIDER_SITE_OTHER): Payer: 59 | Admitting: Urology

## 2022-03-29 ENCOUNTER — Other Ambulatory Visit (HOSPITAL_COMMUNITY): Payer: Self-pay

## 2022-03-29 VITALS — BP 140/96 | HR 59 | Ht 70.0 in | Wt 175.0 lb

## 2022-03-29 DIAGNOSIS — N138 Other obstructive and reflux uropathy: Secondary | ICD-10-CM

## 2022-03-29 DIAGNOSIS — N401 Enlarged prostate with lower urinary tract symptoms: Secondary | ICD-10-CM

## 2022-03-29 DIAGNOSIS — Z125 Encounter for screening for malignant neoplasm of prostate: Secondary | ICD-10-CM

## 2022-03-29 DIAGNOSIS — N393 Stress incontinence (female) (male): Secondary | ICD-10-CM

## 2022-03-29 LAB — BLADDER SCAN AMB NON-IMAGING

## 2022-03-29 NOTE — Patient Instructions (Signed)
Kegel Exercises  Kegel exercises can help strengthen your pelvic floor muscles. The pelvic floor is a group of muscles that support your rectum, small intestine, and bladder. In females, pelvic floor muscles also help support the uterus. These muscles help you control the flow of urine and stool (feces). Kegel exercises are painless and simple. They do not require any equipment. Your provider may suggest Kegel exercises to: Improve bladder and bowel control. Improve sexual response. Improve weak pelvic floor muscles after surgery to remove the uterus (hysterectomy) or after pregnancy, in females. Improve weak pelvic floor muscles after prostate gland removal or surgery, in males. Kegel exercises involve squeezing your pelvic floor muscles. These are the same muscles you squeeze when you try to stop the flow of urine or keep from passing gas. The exercises can be done while sitting, standing, or lying down, but it is best to vary your position. Ask your health care provider which exercises are safe for you. Do exercises exactly as told by your health care provider and adjust them as directed. Do not begin these exercises until told by your health care provider. Exercises How to do Kegel exercises: Squeeze your pelvic floor muscles tight. You should feel a tight lift in your rectal area. If you are a male, you should also feel a tightness in your vaginal area. Keep your stomach, buttocks, and legs relaxed. Hold the muscles tight for up to 10 seconds. Breathe normally. Relax your muscles for up to 10 seconds. Repeat as told by your health care provider. Repeat this exercise daily as told by your health care provider. Continue to do this exercise for at least 4-6 weeks, or for as long as told by your health care provider. You may be referred to a physical therapist who can help you learn more about how to do Kegel exercises. Depending on your condition, your health care provider may  recommend: Varying how long you squeeze your muscles. Doing several sets of exercises every day. Doing exercises for several weeks. Making Kegel exercises a part of your regular exercise routine. This information is not intended to replace advice given to you by your health care provider. Make sure you discuss any questions you have with your health care provider. Document Revised: 05/21/2020 Document Reviewed: 05/21/2020 Elsevier Patient Education  2023 Elsevier Inc.  

## 2022-03-29 NOTE — Telephone Encounter (Signed)
Pt aware.

## 2022-03-29 NOTE — Telephone Encounter (Signed)
Pharmacy Patient Advocate Encounter   Received notification from [facility] that prior authorization for Pantoprazole '40mg'$  tabs is required/requested.  Per Test Claim: Quantity limit exceeded, PA required. Max quantity 90 in 365 days.   PA submitted on 03/29/22 to (ins) [employer] via [employer]  # [id number]  Prior Authorization for Pantoprazole '40mg'$  tabs has been approved.    PA# PA Case ID: [id number]  Placed a call to [facility] to notify of the approval.

## 2022-03-29 NOTE — Progress Notes (Signed)
   03/29/2022 8:29 AM   Travis Gray November 02, 1955 [id number]  Reason for visit: Follow up BPH status post HOLEP  HPI: 67 year old male with long history of prostatitis, BPH/OAB, incomplete bladder emptying with PVRs ranging from 200 to 400 mL, and history of urinary retention and gross hematuria.  He underwent a HOLEP on 03/19/2021 with removal of 25 g benign prostate tissue.  He is doing very well since surgery.  Having some very mild stress incontinence with heavy coughing or sneezing that is minimally bothersome.  Excellent stream, denies any UTI or gross hematuria in the last year, no other urinary complaints.  PVR normal at 12 mL today.  We reviewed Kegel exercises and written information was provided.  In terms of PSA screening, PSA was 1.74 prior to surgery, can continue screening every 2 to 4 years with PCP through age 53.  Follow-up with urology as needed  Travis Gray, [place] [address] [place], [place] [address] [phone]

## 2022-03-30 ENCOUNTER — Encounter: Payer: Self-pay | Admitting: Internal Medicine

## 2022-03-30 DIAGNOSIS — N400 Enlarged prostate without lower urinary tract symptoms: Secondary | ICD-10-CM | POA: Insufficient documentation

## 2022-04-03 ENCOUNTER — Other Ambulatory Visit: Payer: Self-pay | Admitting: Acute Care

## 2022-04-03 DIAGNOSIS — F17210 Nicotine dependence, cigarettes, uncomplicated: Secondary | ICD-10-CM

## 2022-04-03 DIAGNOSIS — Z87891 Personal history of nicotine dependence: Secondary | ICD-10-CM

## 2022-04-03 DIAGNOSIS — Z122 Encounter for screening for malignant neoplasm of respiratory organs: Secondary | ICD-10-CM

## 2022-04-29 ENCOUNTER — Telehealth: Payer: Self-pay | Admitting: Acute Care

## 2022-04-29 ENCOUNTER — Ambulatory Visit
Admission: RE | Admit: 2022-04-29 | Discharge: 2022-04-29 | Disposition: A | Discharge status: Home / Self Care | Payer: 59 | Source: Ambulatory Visit | Attending: Acute Care | Admitting: Acute Care

## 2022-04-29 ENCOUNTER — Ambulatory Visit: Payer: 59

## 2022-04-29 DIAGNOSIS — F17210 Nicotine dependence, cigarettes, uncomplicated: Secondary | ICD-10-CM | POA: Diagnosis not present

## 2022-04-29 DIAGNOSIS — Z87891 Personal history of nicotine dependence: Secondary | ICD-10-CM

## 2022-04-29 DIAGNOSIS — Z122 Encounter for screening for malignant neoplasm of respiratory organs: Secondary | ICD-10-CM

## 2022-04-29 NOTE — Telephone Encounter (Signed)
I have attempted to call the patient with the results of their  Low Dose CT Chest Lung cancer screening scan. There was no answer. I have left a HIPPA compliant VM requesting the patient call the office for the scan results. I included the office contact information in the message. We will await his return call. If no return call we will continue to call until patient is contacted.    Patient has changes that look infectious/inflammatory. I have spoken with Dr. Reece Agar, and she feels best option here is for the patient to be seen in their office and then a 3 month follow up Screening scan to re-evaluate the area of concern. We can arrange everything once I have contacted the patient. Let me know if he call back. Thanks so much

## 2022-04-29 NOTE — Telephone Encounter (Signed)
Pt called the office returning a call from Travis Gray about recent CT results. Please advise.

## 2022-04-29 NOTE — Telephone Encounter (Signed)
Received call report from Tiffany with [facility] on patient's LCS CT done on 04/29/22. Sarah, please review the result/impression copied below:  IMPRESSION: 1. Lung-RADS 4Bs, suspicious. Additional imaging evaluation or consultation with Pulmonology or Thoracic Surgery recommended. 2. Coronary artery calcifications. 3. Aortic Atherosclerosis (ICD10-I70.0) and Emphysema (ICD10-J43.9).  Please advise, thank you.   **also routing to lung nodule pool**

## 2022-05-03 NOTE — Telephone Encounter (Signed)
Patient is returning phone call. Patient phone number is [phone] and [phone].

## 2022-05-04 ENCOUNTER — Telehealth: Payer: Self-pay

## 2022-05-04 NOTE — Telephone Encounter (Signed)
Received epic secure from Dr. Jayme Cloud- patient needs appt to discuss CT.   Lm for patient. Appt can be with Dr. Jayme Cloud or Dr. Aundria Rud next available.

## 2022-05-04 NOTE — Telephone Encounter (Signed)
Patient has been scheduled for 05/19/2022 at 9:00am with Dr. Aundria Rud.  Nothing further needed.

## 2022-05-05 ENCOUNTER — Encounter: Payer: Self-pay | Admitting: Licensed Clinical Social Worker

## 2022-05-05 NOTE — Telephone Encounter (Signed)
UPDATE: VUS in NF2 called [id number] has been reclassified to "Likely Benign." The amended report date is 05/04/2022.

## 2022-05-13 ENCOUNTER — Other Ambulatory Visit (INDEPENDENT_AMBULATORY_CARE_PROVIDER_SITE_OTHER): Payer: Self-pay | Admitting: Nurse Practitioner

## 2022-05-13 ENCOUNTER — Other Ambulatory Visit: Payer: Self-pay | Admitting: Internal Medicine

## 2022-05-13 NOTE — Telephone Encounter (Signed)
Rx sent in for abitriptyline needs f/u appt

## 2022-05-19 ENCOUNTER — Ambulatory Visit: Payer: Medicare HMO | Admitting: Student in an Organized Health Care Education/Training Program

## 2022-05-19 ENCOUNTER — Encounter: Payer: Self-pay | Admitting: Student in an Organized Health Care Education/Training Program

## 2022-05-19 VITALS — BP 132/76 | HR 67 | Temp 97.9°F | Ht 70.0 in | Wt 167.0 lb

## 2022-05-19 DIAGNOSIS — Z122 Encounter for screening for malignant neoplasm of respiratory organs: Secondary | ICD-10-CM | POA: Diagnosis not present

## 2022-05-19 DIAGNOSIS — R911 Solitary pulmonary nodule: Secondary | ICD-10-CM | POA: Diagnosis not present

## 2022-05-19 NOTE — Progress Notes (Signed)
Synopsis: Referred in for pulmonary nodule by Dale [place], MD  Assessment & Plan:   1. Screening for malignant neoplasm of respiratory organ 2. Lung nodule  Patient is currently asymptomatic and presenting for the evaluation of a pulmonary nodule noted in the RUL on low dose CT for lung cancer screening. His chest CT's are notable for severe paraseptal emphysema, secondary to his long standing smoking history.  The differential for said clustered nodularity includes infectious, inflammatory, and malignant etiologies. Our options include active surveillance with a repeat chest CT in 3 months, biopsy with robotic assisted navigation, and outright resection (wedge vs. Lobectomy). Given his severe paraseptal emphysema, biopsy and surgery are not without risk. While robotic navigation theoretically carries a 2% risk of pneumothorax, I believe his risk is much higher given that the nodularity is close/embeded within the emphysema. Furthermore, surgery would carry risk as well. His bleeding risk is also increased given he is on clopidogrel. With that in mind, the appearance of the nodularity is more suggestive of an infectious or inflammatory process rather than malignancy. I believe it is safe to obtain a repeat a low dose CT at the 3 month mark to assess for any changes.  I discussed this with [name] and his wife. Should repeat imaging not show a change, or show any growth, we would have to proceed with biopsy and possibly resection.  - CT CHEST LCS NODULE F/U LOW DOSE WO CONTRAST; Future  Return in about 3 months (around 08/18/2022).  I spent 45 minutes caring for this patient today, including preparing to see the patient, obtaining a medical history , reviewing a separately obtained history, performing a medically appropriate examination and/or evaluation, counseling and educating the patient/family/caregiver, ordering medications, tests, or procedures, documenting clinical information in  the electronic health record, and independently interpreting results (not separately reported/billed) and communicating results to the patient/family/caregiver  Raechel Chute, MD [facility] 05/19/2022 9:07 AM    End of visit medications:  No orders of the defined types were placed in this encounter.    Current Outpatient Medications:    amitriptyline (ELAVIL) 25 MG tablet, TAKE 1 TABLET BY MOUTH AT BEDTIME, Disp: 30 tablet, Rfl: 0   atorvastatin (LIPITOR) 20 MG tablet, TAKE 1 TABLET BY MOUTH AT BEDTIME, Disp: 90 tablet, Rfl: 1   clopidogrel (PLAVIX) 75 MG tablet, TAKE 1 TABLET BY MOUTH ONCE DAILY, Disp: 30 tablet, Rfl: 11   DULoxetine (CYMBALTA) 30 MG capsule, TAKE 1 CAPSULE BY MOUTH ONCE DAILY, Disp: 90 capsule, Rfl: 1   hydrochlorothiazide (HYDRODIURIL) 25 MG tablet, TAKE 1 TABLET BY MOUTH ONCE DAILY, Disp: 90 tablet, Rfl: 1   losartan (COZAAR) 100 MG tablet, TAKE 1 TABLET BY MOUTH ONCE DAILY, Disp: 90 tablet, Rfl: 3   metoprolol succinate (TOPROL-XL) 25 MG 24 hr tablet, Take 1 tablet (25 mg total) by mouth daily., Disp: 90 tablet, Rfl: 3   omega-3 acid ethyl esters (LOVAZA) 1 g capsule, Take 1 capsule (1 g total) by mouth daily. (Patient taking differently: Take 2 g by mouth daily.), Disp: 90 capsule, Rfl: 3   pantoprazole (PROTONIX) 40 MG tablet, TAKE 1 TABLET BY MOUTH ONCE DAILY, Disp: 90 tablet, Rfl: 1   vitamin B-12 (CYANOCOBALAMIN) 1000 MCG tablet, Take 1,000 mcg by mouth daily., Disp: , Rfl:    Subjective:   PATIENT ID: Travis Gray GENDER: male DOB: [birth year], MRN: [id number]  Chief Complaint  Patient presents with   pulmonary consult    CT 4/5-  dry cough and chills x2-3d    HPI  Patient is a pleasant 67 year old male presenting to clinic for the evaluation of a pulmonary nodule.  Patient has a long standing history of smoking and was enrolled in our lung cancer screening program. He has smoked for over 35 years. He underwent a CT scan April  5th, 2024 that showed a cluster of nodules in the RUL that was read as Lung RADS 4B for which he is referred to [place]. Previous chest imaging from 2023 and 2021 did not show any nodularity in the area of concern.  Patient currently reports 2 days of an increased cough and some chills, but no fevers. He thinks he might have had an URTI. Prior to this week he was in his usual state of health. No chest pain, chest tightness, increased cough/sputum production, or hemoptysis reported. Patient has a chronic cough that is unchanged, productive of whitish sputum. No pleurisy is reported, and no weight loss or night sweats.  Patient's medical history includes BPH s/p HOLEP, PAD s/p bilateral iliac stent placement followed by Dr. Wyn Quaker (on aspirin and clopidogrel), hypertension (losartan and HCTZ), and hyperlipidemia (atorvastatin).  Chest CT 11/2019 Lungs/Pleura: Centrilobular and paraseptal emphysema evident. Previously identified pulmonary nodules are stable. Scattered tiny bilateral calcified and noncalcified pulmonary nodules again noted, stable in the interval. No new suspicious pulmonary nodule or mass. No focal airspace consolidation. No pleural effusion  Chest CT 04/2021 Lungs/Pleura: Central airways are patent. Paraseptal and centrilobular emphysema. No consolidation, pleural effusion or pneumothorax. Stable bilateral solid pulmonary nodules. Largest is located in the right upper lobe and measures 7.8 mm in mean diameter on image 104.  Chest CT 04/2022 Lungs/Pleura: Moderate to severe paraseptal and centrilobular emphysema. No pleural fluid or airspace consolidation. The previously noted lung nodules are stable in the interval. Within the periphery of the right upper lobe there is a cluster of multiple new nodular densities, potentially inflammatory or infectious in etiology. The largest of these measures 11.2 mm.  Ancillary information including prior medications, full  medical/surgical/family/social histories, and PFTs (when available) are listed below and have been reviewed.   Review of Systems  Constitutional:  Negative for chills, fever, malaise/fatigue and weight loss.  Respiratory:  Positive for cough and sputum production (whitish sputum). Negative for hemoptysis, shortness of breath and wheezing.   Cardiovascular:  Negative for chest pain.     Objective:   Vitals:   05/19/22 0842  BP: 132/76  Pulse: 67  Temp: 97.9 F (36.6 C)  TempSrc: Temporal  SpO2: 98%  Weight: 167 lb (75.8 kg)  Height:  (1.778 m)   98% on RA BMI Readings from Last 3 Encounters:  05/19/22 23.96 kg/m  03/29/22 25.11 kg/m  02/04/22 24.39 kg/m   Wt Readings from Last 3 Encounters:  05/19/22 167 lb (75.8 kg)  03/29/22 175 lb (79.4 kg)  02/04/22 170 lb (77.1 kg)    Physical Exam Constitutional:      Appearance: Normal appearance.  Cardiovascular:     Rate and Rhythm: Normal rate and regular rhythm.     Pulses: Normal pulses.     Heart sounds: Normal heart sounds.  Pulmonary:     Effort: Pulmonary effort is normal.     Breath sounds: Normal breath sounds. No wheezing.  Abdominal:     Palpations: Abdomen is soft.  Neurological:     General: No focal deficit present.     Mental Status: He is alert and oriented to person,  place, and time. Mental status is at baseline.       Ancillary Information    Past Medical History:  Diagnosis Date   Chronic headaches    migraines, 3-4x/yr   Degenerative disc disease, cervical    Genetic testing 03/20/2017   Multi-Cancer panel (83 genes) @ [facility] - No pathogenic mutations detected   GERD (gastroesophageal reflux disease)    Hypercholesterolemia    Hypertension    Obesity    Peripheral vascular disease    groin and right leg stents   Skin cancer    arm/ face area.  Has had removed   Sleep apnea    CPAP   Tobacco abuse      Family History  Problem Relation Age of Onset   Hypertension Mother     Breast cancer Mother 21   Uterine cancer Mother        unconfirmed; dx 60s; TAH/BSO; currently 57   Hyperlipidemia Father    Bladder Cancer Paternal Grandfather 59       smoker; deceased 64   Diabetes Maternal Grandmother    Non-Hodgkin's lymphoma Paternal Aunt        deceased 9     Past Surgical History:  Procedure Laterality Date   CARDIAC CATHETERIZATION     CATARACT EXTRACTION W/PHACO Left 04/11/2018   Procedure: CATARACT EXTRACTION PHACO AND INTRAOCULAR LENS PLACEMENT (IOC)  COMPLICATED LEFT;  Surgeon: Lockie Mola, MD;  [facility];  Service: Ophthalmology;  Laterality: Left;  OMIDRIA sleep apnea   COLONOSCOPY WITH PROPOFOL N/A 11/25/2016   Procedure: COLONOSCOPY WITH PROPOFOL;  Surgeon: Toledo, Boykin Nearing, MD;  Location: [facility];  Service: Endoscopy;  Laterality: N/A;   ESOPHAGOGASTRODUODENOSCOPY (EGD) WITH PROPOFOL N/A 11/25/2016   Procedure: ESOPHAGOGASTRODUODENOSCOPY (EGD) WITH PROPOFOL;  Surgeon: Toledo, Boykin Nearing, MD;  Location: [facility];  Service: Endoscopy;  Laterality: N/A;   HERNIA REPAIR     Inguinal,laparoscopic surgery   HOLEP-LASER ENUCLEATION OF THE PROSTATE WITH MORCELLATION N/A 03/19/2021   Procedure: HOLEP-LASER ENUCLEATION OF THE PROSTATE WITH MORCELLATION;  Surgeon: Sondra Come, MD;  Location: [facility];  Service: Urology;  Laterality: N/A;   PERIPHERAL VASCULAR CATHETERIZATION Right 05/21/2015   Procedure: Lower Extremity Angiography;  Surgeon: Annice Needy, MD;  Location: [facility];  Service: Cardiovascular;  Laterality: Right;   PERIPHERAL VASCULAR CATHETERIZATION  05/21/2015   Procedure: Lower Extremity Intervention;  Surgeon: Annice Needy, MD;  Location: [facility];  Service: Cardiovascular;;   radio-ablated therapy     completed in the pain clinic   RETINAL DETACHMENT SURGERY     on right eye x 2    Social History   Socioeconomic History   Marital status: Married    Spouse name:  Not on file   Number of children: Not on file   Years of education: Not on file   Highest education level: Not on file  Occupational History   Not on file  Tobacco Use   Smoking status: Every Day    Packs/day: 0.25    Years: 37.00    Additional pack years: 0.00    Total pack years: 9.25    Types: Cigarettes   Smokeless tobacco: Never   Tobacco comments:    1 pack will last 2-3 days--05/19/2022  Vaping Use   Vaping Use: Never used  Substance and Sexual Activity   Alcohol use: Yes    Alcohol/week: 0.0 standard drinks of alcohol    Comment: very occasional alcohol use -  3-4x/yr   Drug use: No   Sexual activity: Not on file  Other Topics Concern   Not on file  Social History Narrative   Not on file   Social Determinants of Health   Financial Resource Strain: Not on file  Food Insecurity: No Food Insecurity (08/24/2021)   Hunger Vital Sign    Worried About Running Out of Food in the Last Year: Never true    Ran Out of Food in the Last Year: Never true  Transportation Needs: No Transportation Needs (08/24/2021)   PRAPARE - Administrator, Civil Service (Medical): No    Lack of Transportation (Non-Medical): No  Physical Activity: Not on file  Stress: Not on file  Social Connections: Not on file  Intimate Partner Violence: Not At Risk (08/24/2021)   Humiliation, Afraid, Rape, and Kick questionnaire    Fear of Current or Ex-Partner: No    Emotionally Abused: No    Physically Abused: No    Sexually Abused: No     No Known Allergies   CBC    Component Value Date/Time   WBC 5.4 02/16/2021 1735   RBC 4.43 02/16/2021 1735   HGB 14.1 02/16/2021 1735   HGB 14.6 05/21/2012 1456   HCT 41.1 02/16/2021 1735   HCT 41.9 05/21/2012 1456   PLT 213 02/16/2021 1735   PLT 185 05/21/2012 1456   MCV 92.8 02/16/2021 1735   MCV 92 05/21/2012 1456   MCH 31.8 02/16/2021 1735   MCHC 34.3 02/16/2021 1735   RDW 12.2 02/16/2021 1735   RDW 13.0 05/21/2012 1456   LYMPHSABS 2.3  01/09/2020 0806   MONOABS 0.6 01/09/2020 0806   EOSABS 0.2 01/09/2020 0806   BASOSABS 0.0 01/09/2020 0806    Pulmonary Functions Testing Results:     No data to display          Outpatient Medications Prior to Visit  Medication Sig Dispense Refill   amitriptyline (ELAVIL) 25 MG tablet TAKE 1 TABLET BY MOUTH AT BEDTIME 30 tablet 0   atorvastatin (LIPITOR) 20 MG tablet TAKE 1 TABLET BY MOUTH AT BEDTIME 90 tablet 1   clopidogrel (PLAVIX) 75 MG tablet TAKE 1 TABLET BY MOUTH ONCE DAILY 30 tablet 11   DULoxetine (CYMBALTA) 30 MG capsule TAKE 1 CAPSULE BY MOUTH ONCE DAILY 90 capsule 1   hydrochlorothiazide (HYDRODIURIL) 25 MG tablet TAKE 1 TABLET BY MOUTH ONCE DAILY 90 tablet 1   losartan (COZAAR) 100 MG tablet TAKE 1 TABLET BY MOUTH ONCE DAILY 90 tablet 3   metoprolol succinate (TOPROL-XL) 25 MG 24 hr tablet Take 1 tablet (25 mg total) by mouth daily. 90 tablet 3   omega-3 acid ethyl esters (LOVAZA) 1 g capsule Take 1 capsule (1 g total) by mouth daily. (Patient taking differently: Take 2 g by mouth daily.) 90 capsule 3   pantoprazole (PROTONIX) 40 MG tablet TAKE 1 TABLET BY MOUTH ONCE DAILY 90 tablet 1   vitamin B-12 (CYANOCOBALAMIN) 1000 MCG tablet Take 1,000 mcg by mouth daily.     No facility-administered medications prior to visit.

## 2022-05-26 DIAGNOSIS — H40003 Preglaucoma, unspecified, bilateral: Secondary | ICD-10-CM | POA: Diagnosis not present

## 2022-05-26 DIAGNOSIS — Z8669 Personal history of other diseases of the nervous system and sense organs: Secondary | ICD-10-CM | POA: Diagnosis not present

## 2022-05-26 DIAGNOSIS — Z961 Presence of intraocular lens: Secondary | ICD-10-CM | POA: Diagnosis not present

## 2022-06-06 ENCOUNTER — Encounter: Payer: Self-pay | Admitting: Internal Medicine

## 2022-06-06 ENCOUNTER — Ambulatory Visit (INDEPENDENT_AMBULATORY_CARE_PROVIDER_SITE_OTHER): Payer: Medicare HMO | Admitting: Internal Medicine

## 2022-06-06 VITALS — BP 112/68 | HR 79 | Temp 97.9°F | Resp 16 | Ht 69.0 in | Wt 168.2 lb

## 2022-06-06 DIAGNOSIS — K219 Gastro-esophageal reflux disease without esophagitis: Secondary | ICD-10-CM | POA: Diagnosis not present

## 2022-06-06 DIAGNOSIS — D649 Anemia, unspecified: Secondary | ICD-10-CM

## 2022-06-06 DIAGNOSIS — E7800 Pure hypercholesterolemia, unspecified: Secondary | ICD-10-CM

## 2022-06-06 DIAGNOSIS — N400 Enlarged prostate without lower urinary tract symptoms: Secondary | ICD-10-CM

## 2022-06-06 DIAGNOSIS — R0981 Nasal congestion: Secondary | ICD-10-CM | POA: Diagnosis not present

## 2022-06-06 DIAGNOSIS — R911 Solitary pulmonary nodule: Secondary | ICD-10-CM

## 2022-06-06 DIAGNOSIS — I10 Essential (primary) hypertension: Secondary | ICD-10-CM

## 2022-06-06 DIAGNOSIS — R739 Hyperglycemia, unspecified: Secondary | ICD-10-CM | POA: Diagnosis not present

## 2022-06-06 DIAGNOSIS — I739 Peripheral vascular disease, unspecified: Secondary | ICD-10-CM

## 2022-06-06 DIAGNOSIS — Z720 Tobacco use: Secondary | ICD-10-CM

## 2022-06-06 DIAGNOSIS — I700 Atherosclerosis of aorta: Secondary | ICD-10-CM | POA: Diagnosis not present

## 2022-06-06 DIAGNOSIS — I4949 Other premature depolarization: Secondary | ICD-10-CM | POA: Diagnosis not present

## 2022-06-06 NOTE — Progress Notes (Signed)
Subjective:    Patient ID: Travis Gray, male    DOB: 08-Oct-1955, 67 y.o.   MRN: [id number]  Patient here for  Chief Complaint  Patient presents with   Medical Management of Chronic Issues    HPI Here to follow up regarding hypercholesterolemia and hypertension. Reports he is doing relatively well.  Has noticed over the last couple of weeks, increased nasal congestion, drainage and previous scratchy throat.  Minimal cough.  No chest congestion, chest pain or change in breathing.  No nausea or vomiting.  Bowels stable.  Saw Dr Wyn Quaker 02/04/22 - f/u PAD. Is status post bilateral iliac stent placement as well as right infrainguinal revascularization. Recommended to continue plavix and lipitor.  F/u one year. Also saw Dr Richardo Hanks 03/29/22 - f/u s/p HOLEP 03/19/21. Doing well s/p surgery.  Recently evaluated by pulmonary - f/u lung nodule. Recommended f/u CT scan in 3 months.  Scheduled CT - 07/2022.     Past Medical History:  Diagnosis Date   Chronic headaches    migraines, 3-4x/yr   Degenerative disc disease, cervical    Genetic testing 03/20/2017   Multi-Cancer panel (83 genes) @ [facility] - No pathogenic mutations detected   GERD (gastroesophageal reflux disease)    Hypercholesterolemia    Hypertension    Obesity    Peripheral vascular disease (HCC)    groin and right leg stents   Skin cancer    arm/ face area.  Has had removed   Sleep apnea    CPAP   Tobacco abuse    Past Surgical History:  Procedure Laterality Date   CARDIAC CATHETERIZATION     CATARACT EXTRACTION W/PHACO Left 04/11/2018   Procedure: CATARACT EXTRACTION PHACO AND INTRAOCULAR LENS PLACEMENT (IOC)  COMPLICATED LEFT;  Surgeon: Lockie Mola, MD;  Location: [facility];  Service: Ophthalmology;  Laterality: Left;  OMIDRIA sleep apnea   COLONOSCOPY WITH PROPOFOL N/A 11/25/2016   Procedure: COLONOSCOPY WITH PROPOFOL;  Surgeon: Toledo, Boykin Nearing, MD;  Location: [facility];  Service: Endoscopy;   Laterality: N/A;   ESOPHAGOGASTRODUODENOSCOPY (EGD) WITH PROPOFOL N/A 11/25/2016   Procedure: ESOPHAGOGASTRODUODENOSCOPY (EGD) WITH PROPOFOL;  Surgeon: Toledo, Boykin Nearing, MD;  Location: [facility];  Service: Endoscopy;  Laterality: N/A;   HERNIA REPAIR     Inguinal,laparoscopic surgery   HOLEP-LASER ENUCLEATION OF THE PROSTATE WITH MORCELLATION N/A 03/19/2021   Procedure: HOLEP-LASER ENUCLEATION OF THE PROSTATE WITH MORCELLATION;  Surgeon: Sondra Come, MD;  Location: [facility];  Service: Urology;  Laterality: N/A;   PERIPHERAL VASCULAR CATHETERIZATION Right 05/21/2015   Procedure: Lower Extremity Angiography;  Surgeon: Annice Needy, MD;  Location: [facility];  Service: Cardiovascular;  Laterality: Right;   PERIPHERAL VASCULAR CATHETERIZATION  05/21/2015   Procedure: Lower Extremity Intervention;  Surgeon: Annice Needy, MD;  Location: [facility];  Service: Cardiovascular;;   radio-ablated therapy     completed in the pain clinic   RETINAL DETACHMENT SURGERY     on right eye x 2   Family History  Problem Relation Age of Onset   Hypertension Mother    Breast cancer Mother 28   Uterine cancer Mother        unconfirmed; dx 63s; TAH/BSO; currently 34   Hyperlipidemia Father    Bladder Cancer Paternal Grandfather 62       smoker; deceased 44   Diabetes Maternal Grandmother    Non-Hodgkin's lymphoma Paternal Aunt        deceased 63   Social  History   Socioeconomic History   Marital status: Married    Spouse name: Not on file   Number of children: Not on file   Years of education: Not on file   Highest education level: Not on file  Occupational History   Not on file  Tobacco Use   Smoking status: Every Day    Packs/day: 0.25    Years: 37.00    Additional pack years: 0.00    Total pack years: 9.25    Types: Cigarettes   Smokeless tobacco: Never   Tobacco comments:    1 pack will last 2-3 days--05/19/2022  Vaping Use   Vaping Use: Never used   Substance and Sexual Activity   Alcohol use: Yes    Alcohol/week: 0.0 standard drinks of alcohol    Comment: very occasional alcohol use - 3-4x/yr   Drug use: No   Sexual activity: Not on file  Other Topics Concern   Not on file  Social History Narrative   Not on file   Social Determinants of Health   Financial Resource Strain: Not on file  Food Insecurity: No Food Insecurity (08/24/2021)   Hunger Vital Sign    Worried About Running Out of Food in the Last Year: Never true    Ran Out of Food in the Last Year: Never true  Transportation Needs: No Transportation Needs (08/24/2021)   PRAPARE - Administrator, Civil Service (Medical): No    Lack of Transportation (Non-Medical): No  Physical Activity: Not on file  Stress: Not on file  Social Connections: Not on file     Review of Systems  Constitutional:  Negative for appetite change and unexpected weight change.  HENT:  Positive for congestion and postnasal drip. Negative for sinus pressure.   Respiratory:  Negative for chest tightness.        Breathing stable.  No increased cough   Cardiovascular:  Negative for chest pain, palpitations and leg swelling.  Gastrointestinal:  Negative for diarrhea, nausea and vomiting.  Genitourinary:  Negative for difficulty urinating and dysuria.  Musculoskeletal:  Positive for back pain. Negative for joint swelling and myalgias.  Skin:  Negative for color change and rash.  Neurological:  Negative for dizziness and headaches.  Psychiatric/Behavioral:  Negative for agitation and dysphoric mood.        Objective:     BP 112/68   Pulse 79   Temp 97.9 F (36.6 C)   Resp 16   Ht 5\' 9"  (1.753 m)   Wt 168 lb 3.2 oz (76.3 kg)   SpO2 99%   BMI 24.84 kg/m  Wt Readings from Last 3 Encounters:  06/06/22 168 lb 3.2 oz (76.3 kg)  05/19/22 167 lb (75.8 kg)  03/29/22 175 lb (79.4 kg)    Physical Exam Vitals reviewed.  Constitutional:      General: He is not in acute distress.     Appearance: Normal appearance. He is well-developed.  HENT:     Head: Normocephalic and atraumatic.     Right Ear: External ear normal.     Left Ear: External ear normal.  Eyes:     General: No scleral icterus.       Right eye: No discharge.        Left eye: No discharge.     Conjunctiva/sclera: Conjunctivae normal.  Cardiovascular:     Rate and Rhythm: Normal rate and regular rhythm.  Pulmonary:     Effort: Pulmonary effort is normal. No respiratory  distress.     Breath sounds: Normal breath sounds.  Abdominal:     General: Bowel sounds are normal.     Palpations: Abdomen is soft.     Tenderness: There is no abdominal tenderness.  Musculoskeletal:        General: No swelling or tenderness.     Cervical back: Neck supple. No tenderness.  Lymphadenopathy:     Cervical: No cervical adenopathy.  Skin:    Findings: No erythema or rash.  Neurological:     Mental Status: He is alert.  Psychiatric:        Mood and Affect: Mood normal.        Behavior: Behavior normal.      Outpatient Encounter Medications as of 06/06/2022  Medication Sig   amitriptyline (ELAVIL) 25 MG tablet TAKE 1 TABLET BY MOUTH AT BEDTIME   atorvastatin (LIPITOR) 20 MG tablet TAKE 1 TABLET BY MOUTH AT BEDTIME   clopidogrel (PLAVIX) 75 MG tablet TAKE 1 TABLET BY MOUTH ONCE DAILY   DULoxetine (CYMBALTA) 30 MG capsule TAKE 1 CAPSULE BY MOUTH ONCE DAILY   hydrochlorothiazide (HYDRODIURIL) 25 MG tablet TAKE 1 TABLET BY MOUTH ONCE DAILY   losartan (COZAAR) 100 MG tablet TAKE 1 TABLET BY MOUTH ONCE DAILY   metoprolol succinate (TOPROL-XL) 25 MG 24 hr tablet Take 1 tablet (25 mg total) by mouth daily.   omega-3 acid ethyl esters (LOVAZA) 1 g capsule Take 1 capsule (1 g total) by mouth daily. (Patient taking differently: Take 2 g by mouth daily.)   pantoprazole (PROTONIX) 40 MG tablet TAKE 1 TABLET BY MOUTH ONCE DAILY   vitamin B-12 (CYANOCOBALAMIN) 1000 MCG tablet Take 1,000 mcg by mouth daily.   No  facility-administered encounter medications on file as of 06/06/2022.     Lab Results  Component Value Date   WBC 5.3 06/13/2022   HGB 13.7 06/13/2022   HCT 39.9 06/13/2022   PLT 228.0 06/13/2022   GLUCOSE 99 06/13/2022   CHOL 142 06/13/2022   TRIG 242.0 (H) 06/13/2022   HDL 29.40 (L) 06/13/2022   LDLDIRECT 60.0 06/13/2022   LDLCALC 60 04/24/2013   ALT 12 06/13/2022   AST 15 06/13/2022   NA 142 06/13/2022   K 3.9 06/13/2022   CL 102 06/13/2022   CREATININE 1.05 06/13/2022   BUN 19 06/13/2022   CO2 31 06/13/2022   TSH 3.28 06/13/2022   PSA 0.18 06/13/2022   INR 1.09 05/24/2015   HGBA1C 5.6 06/13/2022    CT CHEST LUNG CA SCREEN LOW DOSE W/O CM  Result Date: 04/29/2022 CLINICAL DATA:  Lung cancer screening. Thirty-eight pack-year history. Current asymptomatic smoker. EXAM: CT CHEST WITHOUT CONTRAST LOW-DOSE FOR LUNG CANCER SCREENING TECHNIQUE: Multidetector CT imaging of the chest was performed following the standard protocol without IV contrast. RADIATION DOSE REDUCTION: This exam was performed according to the departmental dose-optimization program which includes automated exposure control, adjustment of the mA and/or kV according to patient size and/or use of iterative reconstruction technique. COMPARISON:  04/27/2021 FINDINGS: Cardiovascular: Heart size is normal. Aortic atherosclerosis and RCA coronary artery calcification. No pericardial effusion. Mediastinum/Nodes: No enlarged mediastinal or axillary lymph nodes. Thyroid gland, trachea, and esophagus demonstrate no significant findings. Lungs/Pleura: Moderate to severe paraseptal and centrilobular emphysema. No pleural fluid or airspace consolidation. The previously noted lung nodules are stable in the interval. Within the periphery of the right upper lobe there is a cluster of multiple new nodular densities, potentially inflammatory or infectious in etiology. The largest of these  measures 11.2 mm. Upper Abdomen: No acute  abnormality. Musculoskeletal: No chest wall mass or suspicious bone lesions identified. IMPRESSION: 1. Lung-RADS 4Bs, suspicious. Additional imaging evaluation or consultation with Pulmonology or Thoracic Surgery recommended. 2. Coronary artery calcifications. 3. Aortic Atherosclerosis (ICD10-I70.0) and Emphysema (ICD10-J43.9). These results will be called to the ordering clinician or representative by the Radiologist Assistant, and communication documented in the PACS or [employer]. Electronically Signed   By: Signa Kell [name]   On: 04/29/2022 15:00      Assessment & Plan:  Premature beats Assessment & Plan: Noticed on exam.  EKG - SR with PACs.  No chest pain or sob reported.  No increased heart rate or palpitations.  Follow.   Orders: -     EKG 12-Lead  Anemia, unspecified type Assessment & Plan: Follow cbc.    Aortic atherosclerosis (HCC) Assessment & Plan: Continue lipitor.    Benign prostatic hyperplasia, unspecified whether lower urinary tract symptoms present Assessment & Plan: BPH/OAB - S/P HOLEP on 03/19/2021 with removal of 25 g benign prostate tissue.  (Dr Richardo Hanks)   Essential hypertension, benign Assessment & Plan: Pressure as outlined.  Doing well.  Continue losartan and hctz.  Follow pressures.  Follow metabolic panel.  No changes.    Gastroesophageal reflux disease, unspecified whether esophagitis present Assessment & Plan: No upper symptoms reported.  On protonix.    Hypercholesterolemia Assessment & Plan: Continue lipitor.  Low cholesterol diet and exercise as tolerated.  Improved.  Lab Results  Component Value Date   CHOL 142 06/13/2022   HDL 29.40 (L) 06/13/2022   LDLCALC 60 04/24/2013   LDLDIRECT 60.0 06/13/2022   TRIG 242.0 (H) 06/13/2022   CHOLHDL 5 06/13/2022     Hyperglycemia Assessment & Plan: Low carb diet and exercise.  Follow met b and a1c.    Lung nodule Assessment & Plan: CT chest 04/27/21 - Lung-RADS 2, benign appearance  or behavior. Continue annual screening with low-dose chest CT without contrast in 12 months.  Recently evaluated by pulmonary - f/u lung nodule. Had f/u CT 04/2022 - Recommended f/u CT scan in 3 months.  Scheduled CT - 07/2022.     Peripheral vascular disease [facility]) Assessment & Plan: Saw Dr Wyn Quaker 02/04/22 - f/u PAD. Is status post bilateral iliac stent placement as well as right infrainguinal revascularization. Recommended to continue plavix and lipitor.  F/u one year.   Tobacco abuse Assessment & Plan: Smoking as outlined.  Have discussed the need to stop.  Follow.    Nasal congestion Assessment & Plan: Mucinex. Saline nasal spray and steroid nasal spray as directed.  Follow.  Call with update.       Dale Mantachie, MD

## 2022-06-06 NOTE — Patient Instructions (Addendum)
Continue mucinex as you are doing.   Nasacort nasal spray - 2 sprays each nostril one time per day.  Do this in the evening.    Saline nasal spray - can flush nose 1-2 x / day if needed.

## 2022-06-13 ENCOUNTER — Other Ambulatory Visit (INDEPENDENT_AMBULATORY_CARE_PROVIDER_SITE_OTHER): Payer: Medicare HMO

## 2022-06-13 DIAGNOSIS — R739 Hyperglycemia, unspecified: Secondary | ICD-10-CM

## 2022-06-13 DIAGNOSIS — I10 Essential (primary) hypertension: Secondary | ICD-10-CM | POA: Diagnosis not present

## 2022-06-13 DIAGNOSIS — Z125 Encounter for screening for malignant neoplasm of prostate: Secondary | ICD-10-CM

## 2022-06-13 DIAGNOSIS — E7800 Pure hypercholesterolemia, unspecified: Secondary | ICD-10-CM

## 2022-06-13 LAB — LIPID PANEL
Cholesterol: 142 mg/dL (ref 0–200)
HDL: 29.4 mg/dL — ABNORMAL LOW (ref >39.00)
NonHDL: 112.86
Total CHOL/HDL Ratio: 5
Triglycerides: 242 mg/dL — ABNORMAL HIGH (ref 0.0–149.0)
VLDL: 48.4 mg/dL — ABNORMAL HIGH (ref 0.0–40.0)

## 2022-06-13 LAB — HEPATIC FUNCTION PANEL
ALT: 12 U/L (ref 0–53)
AST: 15 U/L (ref 0–37)
Albumin: 4.1 g/dL (ref 3.5–5.2)
Alkaline Phosphatase: 68 U/L (ref 39–117)
Bilirubin, Direct: 0.1 mg/dL (ref 0.0–0.3)
Total Bilirubin: 0.7 mg/dL (ref 0.2–1.2)
Total Protein: 6.3 g/dL (ref 6.0–8.3)

## 2022-06-13 LAB — CBC WITH DIFFERENTIAL/PLATELET
Basophils Absolute: 0 10*3/uL (ref 0.0–0.1)
Basophils Relative: 0.6 % (ref 0.0–3.0)
Eosinophils Absolute: 0.2 10*3/uL (ref 0.0–0.7)
Eosinophils Relative: 4.2 % (ref 0.0–5.0)
HCT: 39.9 % (ref 39.0–52.0)
Hemoglobin: 13.7 g/dL (ref 13.0–17.0)
Lymphocytes Relative: 38.2 % (ref 12.0–46.0)
Lymphs Abs: 2 10*3/uL (ref 0.7–4.0)
MCHC: 34.4 g/dL (ref 30.0–36.0)
MCV: 93.2 fl (ref 78.0–100.0)
Monocytes Absolute: 0.5 10*3/uL (ref 0.1–1.0)
Monocytes Relative: 9.7 % (ref 3.0–12.0)
Neutro Abs: 2.5 10*3/uL (ref 1.4–7.7)
Neutrophils Relative %: 47.3 % (ref 43.0–77.0)
Platelets: 228 10*3/uL (ref 150.0–400.0)
RBC: 4.29 Mil/uL (ref 4.22–5.81)
RDW: 13.2 % (ref 11.5–15.5)
WBC: 5.3 10*3/uL (ref 4.0–10.5)

## 2022-06-13 LAB — BASIC METABOLIC PANEL
BUN: 19 mg/dL (ref 6–23)
CO2: 31 mEq/L (ref 19–32)
Calcium: 8.8 mg/dL (ref 8.4–10.5)
Chloride: 102 mEq/L (ref 96–112)
Creatinine, Ser: 1.05 mg/dL (ref 0.40–1.50)
GFR: 74.71 mL/min (ref >60.00)
Glucose, Bld: 99 mg/dL (ref 70–99)
Potassium: 3.9 mEq/L (ref 3.5–5.1)
Sodium: 142 mEq/L (ref 135–145)

## 2022-06-13 LAB — LDL CHOLESTEROL, DIRECT: Direct LDL: 60 mg/dL

## 2022-06-13 LAB — HEMOGLOBIN A1C: Hgb A1c MFr Bld: 5.6 % (ref 4.6–6.5)

## 2022-06-14 LAB — PSA: PSA: 0.18 ng/mL (ref 0.10–4.00)

## 2022-06-14 LAB — TSH: TSH: 3.28 u[IU]/mL (ref 0.35–5.50)

## 2022-06-15 ENCOUNTER — Telehealth: Payer: Self-pay

## 2022-06-15 NOTE — Telephone Encounter (Signed)
Noted  

## 2022-06-15 NOTE — Telephone Encounter (Signed)
Pt called back and I read the message to him and he understood 

## 2022-06-15 NOTE — Telephone Encounter (Signed)
-----   Message from Dale Walford, MD sent at 06/15/2022  4:46 AM EDT ----- Notify - triglycerides decreased some from recent checks.  Good cholesterol remains low, but improved slightly from last check.  Bad cholesterol level - ok.  Continue lipitor.  Also continue low carb diet and exercise.  Exercise will increase good cholesterol.  Overall sugar control - ok.  PSA wnl.  Hgb, thyroid test, kidney function tests and liver function tests are wnl.

## 2022-06-17 ENCOUNTER — Other Ambulatory Visit: Payer: Self-pay | Admitting: Internal Medicine

## 2022-06-19 ENCOUNTER — Encounter: Payer: Self-pay | Admitting: Internal Medicine

## 2022-06-19 DIAGNOSIS — R0981 Nasal congestion: Secondary | ICD-10-CM | POA: Insufficient documentation

## 2022-06-19 NOTE — Assessment & Plan Note (Signed)
Low carb diet and exercise.  Follow met b and a1c.   

## 2022-06-19 NOTE — Assessment & Plan Note (Signed)
Follow cbc.  

## 2022-06-19 NOTE — Assessment & Plan Note (Signed)
Continue lipitor  ?

## 2022-06-19 NOTE — Assessment & Plan Note (Signed)
Noticed on exam.  EKG - SR with PACs.  No chest pain or sob reported.  No increased heart rate or palpitations.  Follow.

## 2022-06-19 NOTE — Assessment & Plan Note (Signed)
Continue lipitor.  Low cholesterol diet and exercise as tolerated.  Improved.  Lab Results  Component Value Date   CHOL 142 06/13/2022   HDL 29.40 (L) 06/13/2022   LDLCALC 60 04/24/2013   LDLDIRECT 60.0 06/13/2022   TRIG 242.0 (H) 06/13/2022   CHOLHDL 5 06/13/2022

## 2022-06-19 NOTE — Assessment & Plan Note (Signed)
No upper symptoms reported.  On protonix.   

## 2022-06-19 NOTE — Assessment & Plan Note (Signed)
Saw Dr Wyn Quaker 02/04/22 - f/u PAD. Is status post bilateral iliac stent placement as well as right infrainguinal revascularization. Recommended to continue plavix and lipitor.  F/u one year.

## 2022-06-19 NOTE — Assessment & Plan Note (Signed)
Mucinex. Saline nasal spray and steroid nasal spray as directed.  Follow.  Call with update.

## 2022-06-19 NOTE — Assessment & Plan Note (Signed)
CT chest 04/27/21 - Lung-RADS 2, benign appearance or behavior. Continue annual screening with low-dose chest CT without contrast in 12 months.  Recently evaluated by pulmonary - f/u lung nodule. Had f/u CT 04/2022 - Recommended f/u CT scan in 3 months.  Scheduled CT - 07/2022.

## 2022-06-19 NOTE — Assessment & Plan Note (Signed)
BPH/OAB - S/P HOLEP on 03/19/2021 with removal of 25 g benign prostate tissue.  (Dr Richardo Hanks)

## 2022-06-19 NOTE — Assessment & Plan Note (Signed)
Pressure as outlined.  Doing well.  Continue losartan and hctz.  Follow pressures.  Follow metabolic panel.  No changes.  ?

## 2022-06-19 NOTE — Assessment & Plan Note (Signed)
Smoking as outlined.  Have discussed the need to stop.  Follow.

## 2022-06-27 ENCOUNTER — Other Ambulatory Visit: Payer: Self-pay | Admitting: Internal Medicine

## 2022-08-08 ENCOUNTER — Ambulatory Visit
Admission: RE | Admit: 2022-08-08 | Discharge: 2022-08-08 | Disposition: A | Discharge status: Home / Self Care | Payer: Medicare HMO | Source: Ambulatory Visit | Attending: Student in an Organized Health Care Education/Training Program | Admitting: Student in an Organized Health Care Education/Training Program

## 2022-08-08 DIAGNOSIS — R911 Solitary pulmonary nodule: Secondary | ICD-10-CM | POA: Diagnosis not present

## 2022-08-08 DIAGNOSIS — J439 Emphysema, unspecified: Secondary | ICD-10-CM | POA: Diagnosis not present

## 2022-08-08 DIAGNOSIS — Z122 Encounter for screening for malignant neoplasm of respiratory organs: Secondary | ICD-10-CM

## 2022-08-08 DIAGNOSIS — Z87891 Personal history of nicotine dependence: Secondary | ICD-10-CM | POA: Diagnosis not present

## 2022-08-12 ENCOUNTER — Other Ambulatory Visit: Payer: Self-pay | Admitting: Internal Medicine

## 2022-08-15 ENCOUNTER — Encounter: Payer: Self-pay | Admitting: Student in an Organized Health Care Education/Training Program

## 2022-08-15 ENCOUNTER — Ambulatory Visit: Payer: Medicare HMO | Admitting: Student in an Organized Health Care Education/Training Program

## 2022-08-15 VITALS — BP 128/78 | HR 60 | Temp 97.9°F | Ht 70.0 in | Wt 164.2 lb

## 2022-08-15 DIAGNOSIS — R911 Solitary pulmonary nodule: Secondary | ICD-10-CM

## 2022-08-15 NOTE — Progress Notes (Signed)
Assessment & Plan:   1. Lung nodule  Nodule Location: RUL Nodule Size: 19 mm Nodule Spiculation: Yes Associated Lymphadenopathy: No Smoking Status (current) and pack years 40 Extrathoracic cancer > 5 years prior (no) SPN malignancy risk score [facility]): 68 %risk of malignancy ECOG: 0  The patient is here to discuss their imaging abnormalities which include a RUL pulmonary nodule. The patient is asymptomatic and interval follow up imaging showed nodule growth. Given this, risk of the nodule being malignant is certainly elevated, and I would consider straight out resection should PET/CT not show any signs spread. I will place an order for a PET/CT, PFT's, and referral to thoracic surgery for consideration of resection. Should thoracic surgery request a biopsy prior to resection, we would schedule him for robotic assisted navigational bronchoscopy.  - Ambulatory referral to Cardiothoracic Surgery - NM PET Image Initial (PI) Skull Base To Thigh (F-18 FDG); Future - PFT's  Return in about 3 months (around 11/15/2022).  I spent 30 minutes caring for this patient today, including preparing to see the patient, obtaining a medical history , reviewing a separately obtained history, performing a medically appropriate examination and/or evaluation, counseling and educating the patient/family/caregiver, ordering medications, tests, or procedures, documenting clinical information in the electronic health record, and independently interpreting results (not separately reported/billed) and communicating results to the patient/family/caregiver  Raechel Chute, MD [facility] 08/15/2022 9:13 AM    End of visit medications:  No orders of the defined types were placed in this encounter.    Current Outpatient Medications:    amitriptyline (ELAVIL) 25 MG tablet, TAKE 1 TABLET BY MOUTH AT BEDTIME, Disp: 30 tablet, Rfl: 3   atorvastatin (LIPITOR) 20 MG tablet, TAKE 1  TABLET BY MOUTH AT BEDTIME, Disp: 90 tablet, Rfl: 1   clopidogrel (PLAVIX) 75 MG tablet, TAKE 1 TABLET BY MOUTH ONCE DAILY, Disp: 30 tablet, Rfl: 11   DULoxetine (CYMBALTA) 30 MG capsule, TAKE 1 CAPSULE BY MOUTH ONCE DAILY, Disp: 90 capsule, Rfl: 1   hydrochlorothiazide (HYDRODIURIL) 25 MG tablet, TAKE 1 TABLET BY MOUTH ONCE DAILY, Disp: 90 tablet, Rfl: 1   losartan (COZAAR) 100 MG tablet, TAKE 1 TABLET BY MOUTH ONCE DAILY, Disp: 90 tablet, Rfl: 3   metoprolol succinate (TOPROL-XL) 25 MG 24 hr tablet, Take 1 tablet (25 mg total) by mouth daily., Disp: 90 tablet, Rfl: 3   omega-3 acid ethyl esters (LOVAZA) 1 g capsule, Take 1 capsule (1 g total) by mouth daily. (Patient taking differently: Take 2 g by mouth daily.), Disp: 90 capsule, Rfl: 3   pantoprazole (PROTONIX) 40 MG tablet, TAKE 1 TABLET BY MOUTH ONCE DAILY, Disp: 90 tablet, Rfl: 1   vitamin B-12 (CYANOCOBALAMIN) 1000 MCG tablet, Take 1,000 mcg by mouth daily., Disp: , Rfl:    Subjective:   PATIENT ID: Travis Gray GENDER: male DOB: 09/02/1955, MRN: [id number]  Chief Complaint  Patient presents with   Follow-up    CT 7/15    HPI  Patient is a pleasant 67 year old male presenting to clinic for the evaluation of a pulmonary nodule.   Patient has a long standing history of smoking and was enrolled in our lung cancer screening program. He has smoked for over 35 years. He underwent a CT scan April 5th, 2024 that showed a cluster of nodules in the RUL that was read as Lung RADS 4B for which he is referred to [place].    He thinks he might have had  an URTI around the time of his index CT in April. He is now in his usual state of health and reports no chest pain, chest tightness, increased cough/sputum production, or hemoptysis reported. Patient has a chronic cough that is unchanged, productive of whitish sputum. No pleurisy is reported, and no weight loss or night sweats.   Patient's medical history includes BPH s/p HOLEP, PAD s/p bilateral  iliac stent placement followed by Dr. Wyn Quaker (on aspirin and clopidogrel), hypertension (losartan and HCTZ), and hyperlipidemia (atorvastatin).   Chest CT 11/2019 Lungs/Pleura: Centrilobular and paraseptal emphysema evident. Previously identified pulmonary nodules are stable. Scattered tiny bilateral calcified and noncalcified pulmonary nodules again noted, stable in the interval. No new suspicious pulmonary nodule or mass. No focal airspace consolidation. No pleural effusion   Chest CT 04/2021 Lungs/Pleura: Central airways are patent. Paraseptal and centrilobular emphysema. No consolidation, pleural effusion or pneumothorax. Stable bilateral solid pulmonary nodules. Largest is located in the right upper lobe and measures 7.8 mm in mean diameter on image 104.   Chest CT 04/2022 Lungs/Pleura: Moderate to severe paraseptal and centrilobular emphysema. No pleural fluid or airspace consolidation. The previously noted lung nodules are stable in the interval. Within the periphery of the right upper lobe there is a cluster of multiple new nodular densities, potentially inflammatory or infectious in etiology. The largest of these measures 11.2 mm.  Chest CT 07/2022  Enlarging 19.4 mm spiculated apical segment right upper lobe nodule. Lung-RADS 4X, highly suspicious.   Ancillary information including prior medications, full medical/surgical/family/social histories, and PFTs (when available) are listed below and have been reviewed.   Review of Systems  Constitutional:  Negative for chills, fever, malaise/fatigue and weight loss.  Respiratory:  Positive for cough and sputum production (whitish sputum). Negative for hemoptysis, shortness of breath and wheezing.   Cardiovascular:  Negative for chest pain.     Objective:   Vitals:   08/15/22 0847  BP: 128/78  Pulse: 60  Temp: 97.9 F (36.6 C)  TempSrc: Temporal  SpO2: 96%  Weight: 164 lb 3.2 oz (74.5 kg)  Height: 5\' 10"  (1.778 m)    96% on RA BMI Readings from Last 3 Encounters:  08/15/22 23.56 kg/m  06/06/22 24.84 kg/m  05/19/22 23.96 kg/m   Wt Readings from Last 3 Encounters:  08/15/22 164 lb 3.2 oz (74.5 kg)  06/06/22 168 lb 3.2 oz (76.3 kg)  05/19/22 167 lb (75.8 kg)    Physical Exam Constitutional:      Appearance: Normal appearance.  Cardiovascular:     Rate and Rhythm: Normal rate and regular rhythm.     Pulses: Normal pulses.     Heart sounds: Normal heart sounds.  Pulmonary:     Effort: Pulmonary effort is normal.     Breath sounds: Normal breath sounds. No wheezing.  Abdominal:     Palpations: Abdomen is soft.  Neurological:     General: No focal deficit present.     Mental Status: He is alert and oriented to person, place, and time. Mental status is at baseline.       Ancillary Information    Past Medical History:  Diagnosis Date   Chronic headaches    migraines, 3-4x/yr   Degenerative disc disease, cervical    Genetic testing 03/20/2017   Multi-Cancer panel (83 genes) @ [facility] - No pathogenic mutations detected   GERD (gastroesophageal reflux disease)    Hypercholesterolemia    Hypertension    Obesity    Peripheral vascular disease (HCC)  groin and right leg stents   Skin cancer    arm/ face area.  Has had removed   Sleep apnea    CPAP   Tobacco abuse      Family History  Problem Relation Age of Onset   Hypertension Mother    Breast cancer Mother 52   Uterine cancer Mother        unconfirmed; dx 40s; TAH/BSO; currently 29   Hyperlipidemia Father    Bladder Cancer Paternal Grandfather 87       smoker; deceased 31   Diabetes Maternal Grandmother    Non-Hodgkin's lymphoma Paternal Aunt        deceased 54     Past Surgical History:  Procedure Laterality Date   CARDIAC CATHETERIZATION     CATARACT EXTRACTION W/PHACO Left 04/11/2018   Procedure: CATARACT EXTRACTION PHACO AND INTRAOCULAR LENS PLACEMENT (IOC)  COMPLICATED LEFT;  Surgeon: Lockie Mola,  MD;  Location: [facility];  Service: Ophthalmology;  Laterality: Left;  OMIDRIA sleep apnea   COLONOSCOPY WITH PROPOFOL N/A 11/25/2016   Procedure: COLONOSCOPY WITH PROPOFOL;  Surgeon: Toledo, Boykin Nearing, MD;  Location: [facility];  Service: Endoscopy;  Laterality: N/A;   ESOPHAGOGASTRODUODENOSCOPY (EGD) WITH PROPOFOL N/A 11/25/2016   Procedure: ESOPHAGOGASTRODUODENOSCOPY (EGD) WITH PROPOFOL;  Surgeon: Toledo, Boykin Nearing, MD;  Location: [facility];  Service: Endoscopy;  Laterality: N/A;   HERNIA REPAIR     Inguinal,laparoscopic surgery   HOLEP-LASER ENUCLEATION OF THE PROSTATE WITH MORCELLATION N/A 03/19/2021   Procedure: HOLEP-LASER ENUCLEATION OF THE PROSTATE WITH MORCELLATION;  Surgeon: Sondra Come, MD;  Location: [facility];  Service: Urology;  Laterality: N/A;   PERIPHERAL VASCULAR CATHETERIZATION Right 05/21/2015   Procedure: Lower Extremity Angiography;  Surgeon: Annice Needy, MD;  Location: [facility];  Service: Cardiovascular;  Laterality: Right;   PERIPHERAL VASCULAR CATHETERIZATION  05/21/2015   Procedure: Lower Extremity Intervention;  Surgeon: Annice Needy, MD;  Location: [facility];  Service: Cardiovascular;;   radio-ablated therapy     completed in the pain clinic   RETINAL DETACHMENT SURGERY     on right eye x 2    Social History   Socioeconomic History   Marital status: Married    Spouse name: Not on file   Number of children: Not on file   Years of education: Not on file   Highest education level: Not on file  Occupational History   Not on file  Tobacco Use   Smoking status: Every Day    Current packs/day: 0.50    Average packs/day: 0.5 packs/day for 37.0 years (18.5 ttl pk-yrs)    Types: Cigarettes   Smokeless tobacco: Never   Tobacco comments:    1 pack will last 2-3 days--08/15/2022  Vaping Use   Vaping status: Never Used  Substance and Sexual Activity   Alcohol use: Yes    Alcohol/week: 0.0 standard drinks of alcohol     Comment: very occasional alcohol use - 3-4x/yr   Drug use: No   Sexual activity: Not on file  Other Topics Concern   Not on file  Social History Narrative   Not on file   Social Determinants of Health   Financial Resource Strain: Not on file  Food Insecurity: No Food Insecurity (08/24/2021)   Hunger Vital Sign    Worried About Running Out of Food in the Last Year: Never true    Ran Out of Food in the Last Year: Never true  Transportation Needs: No Transportation  Needs (08/24/2021)   PRAPARE - Administrator, Civil Service (Medical): No    Lack of Transportation (Non-Medical): No  Physical Activity: Not on file  Stress: Not on file  Social Connections: Not on file  Intimate Partner Violence: Not At Risk (08/24/2021)   Humiliation, Afraid, Rape, and Kick questionnaire    Fear of Current or Ex-Partner: No    Emotionally Abused: No    Physically Abused: No    Sexually Abused: No     No Known Allergies   CBC    Component Value Date/Time   WBC 5.3 06/13/2022 0733   RBC 4.29 06/13/2022 0733   HGB 13.7 06/13/2022 0733   HGB 14.6 05/21/2012 1456   HCT 39.9 06/13/2022 0733   HCT 41.9 05/21/2012 1456   PLT 228.0 06/13/2022 0733   PLT 185 05/21/2012 1456   MCV 93.2 06/13/2022 0733   MCV 92 05/21/2012 1456   MCH 31.8 02/16/2021 1735   MCHC 34.4 06/13/2022 0733   RDW 13.2 06/13/2022 0733   RDW 13.0 05/21/2012 1456   LYMPHSABS 2.0 06/13/2022 0733   MONOABS 0.5 06/13/2022 0733   EOSABS 0.2 06/13/2022 0733   BASOSABS 0.0 06/13/2022 1610    Pulmonary Functions Testing Results:     No data to display          Outpatient Medications Prior to Visit  Medication Sig Dispense Refill   amitriptyline (ELAVIL) 25 MG tablet TAKE 1 TABLET BY MOUTH AT BEDTIME 30 tablet 3   atorvastatin (LIPITOR) 20 MG tablet TAKE 1 TABLET BY MOUTH AT BEDTIME 90 tablet 1   clopidogrel (PLAVIX) 75 MG tablet TAKE 1 TABLET BY MOUTH ONCE DAILY 30 tablet 11   DULoxetine (CYMBALTA) 30 MG  capsule TAKE 1 CAPSULE BY MOUTH ONCE DAILY 90 capsule 1   hydrochlorothiazide (HYDRODIURIL) 25 MG tablet TAKE 1 TABLET BY MOUTH ONCE DAILY 90 tablet 1   losartan (COZAAR) 100 MG tablet TAKE 1 TABLET BY MOUTH ONCE DAILY 90 tablet 3   metoprolol succinate (TOPROL-XL) 25 MG 24 hr tablet Take 1 tablet (25 mg total) by mouth daily. 90 tablet 3   omega-3 acid ethyl esters (LOVAZA) 1 g capsule Take 1 capsule (1 g total) by mouth daily. (Patient taking differently: Take 2 g by mouth daily.) 90 capsule 3   pantoprazole (PROTONIX) 40 MG tablet TAKE 1 TABLET BY MOUTH ONCE DAILY 90 tablet 1   vitamin B-12 (CYANOCOBALAMIN) 1000 MCG tablet Take 1,000 mcg by mouth daily.     No facility-administered medications prior to visit.

## 2022-08-16 ENCOUNTER — Ambulatory Visit: Payer: Medicare HMO | Admitting: Student in an Organized Health Care Education/Training Program

## 2022-08-23 ENCOUNTER — Ambulatory Visit
Admission: RE | Admit: 2022-08-23 | Discharge: 2022-08-23 | Disposition: A | Discharge status: Home / Self Care | Payer: Medicare HMO | Source: Ambulatory Visit | Attending: Student in an Organized Health Care Education/Training Program | Admitting: Student in an Organized Health Care Education/Training Program

## 2022-08-23 DIAGNOSIS — R918 Other nonspecific abnormal finding of lung field: Secondary | ICD-10-CM | POA: Insufficient documentation

## 2022-08-23 DIAGNOSIS — M5136 Other intervertebral disc degeneration, lumbar region: Secondary | ICD-10-CM | POA: Insufficient documentation

## 2022-08-23 DIAGNOSIS — R911 Solitary pulmonary nodule: Secondary | ICD-10-CM | POA: Insufficient documentation

## 2022-08-23 LAB — GLUCOSE, CAPILLARY: Glucose-Capillary: 107 mg/dL — ABNORMAL HIGH (ref 70–99)

## 2022-08-23 MED ORDER — FLUDEOXYGLUCOSE F - 18 (FDG) INJECTION
8.5000 | Freq: Once | INTRAVENOUS | Status: AC | PRN
  Administered 2022-08-23: 8.94 via INTRAVENOUS

## 2022-08-25 ENCOUNTER — Ambulatory Visit: Payer: Medicare HMO | Attending: Student in an Organized Health Care Education/Training Program

## 2022-08-25 DIAGNOSIS — R911 Solitary pulmonary nodule: Secondary | ICD-10-CM | POA: Diagnosis not present

## 2022-08-25 DIAGNOSIS — R942 Abnormal results of pulmonary function studies: Secondary | ICD-10-CM | POA: Diagnosis not present

## 2022-08-25 DIAGNOSIS — F17210 Nicotine dependence, cigarettes, uncomplicated: Secondary | ICD-10-CM | POA: Diagnosis not present

## 2022-08-25 DIAGNOSIS — J449 Chronic obstructive pulmonary disease, unspecified: Secondary | ICD-10-CM | POA: Diagnosis not present

## 2022-08-25 MED ORDER — ALBUTEROL SULFATE (2.5 MG/3ML) 0.083% IN NEBU
2.5000 mg | INHALATION_SOLUTION | Freq: Once | RESPIRATORY_TRACT | Status: AC
  Administered 2022-08-25: 2.5 mg via RESPIRATORY_TRACT

## 2022-08-29 ENCOUNTER — Encounter: Payer: Self-pay | Admitting: Student in an Organized Health Care Education/Training Program

## 2022-08-29 NOTE — Telephone Encounter (Signed)
Dr. Dgayli, please advise. Thanks   

## 2022-09-01 NOTE — Progress Notes (Signed)
[address]       [place] [address]             [phone]                    Wen [place] [facility] Medical Record #[id number] Date of Birth: Apr 30, 1955  Referring: Raechel Chute, MD Primary Care: Dale [place], MD Primary Cardiologist: None  Chief Complaint:    Chief Complaint  Patient presents with   Lung Lesion    New patient consultation, Chest CT 08/08/22/ PET Scan 08/23/22/ PFT' 08/25/22     History of Present Illness:    Travis Gray 67 y.o. male presents for surgical evaluation of a 1.9cm right upper lobe pulmonary nodule.  He has a long hx of smoking, and continues to smoke currently.  This was found on screen CT, and has shown interval growth.          Past Medical History:  Diagnosis Date   Chronic headaches    migraines, 3-4x/yr   Degenerative disc disease, cervical    Genetic testing 03/20/2017   Multi-Cancer panel (83 genes) @ [facility] - No pathogenic mutations detected   GERD (gastroesophageal reflux disease)    Hypercholesterolemia    Hypertension    Obesity    Peripheral vascular disease (HCC)    groin and right leg stents   Skin cancer    arm/ face area.  Has had removed   Sleep apnea    CPAP   Tobacco abuse     Past Surgical History:  Procedure Laterality Date   CARDIAC CATHETERIZATION     CATARACT EXTRACTION W/PHACO Left 04/11/2018   Procedure: CATARACT EXTRACTION PHACO AND INTRAOCULAR LENS PLACEMENT (IOC)  COMPLICATED LEFT;  Surgeon: Lockie Mola, MD;  Location: [facility];  Service: Ophthalmology;  Laterality: Left;  OMIDRIA sleep apnea   COLONOSCOPY WITH PROPOFOL N/A 11/25/2016   Procedure: COLONOSCOPY WITH PROPOFOL;  Surgeon: Toledo, Boykin Nearing, MD;  Location: [facility];  Service: Endoscopy;  Laterality: N/A;   ESOPHAGOGASTRODUODENOSCOPY (EGD) WITH PROPOFOL N/A 11/25/2016   Procedure: ESOPHAGOGASTRODUODENOSCOPY (EGD) WITH PROPOFOL;  Surgeon: Toledo, Boykin Nearing, MD;  Location: [facility]  ENDOSCOPY;  Service: Endoscopy;  Laterality: N/A;   HERNIA REPAIR     Inguinal,laparoscopic surgery   HOLEP-LASER ENUCLEATION OF THE PROSTATE WITH MORCELLATION N/A 03/19/2021   Procedure: HOLEP-LASER ENUCLEATION OF THE PROSTATE WITH MORCELLATION;  Surgeon: Sondra Come, MD;  Location: [facility];  Service: Urology;  Laterality: N/A;   PERIPHERAL VASCULAR CATHETERIZATION Right 05/21/2015   Procedure: Lower Extremity Angiography;  Surgeon: Annice Needy, MD;  Location: [facility];  Service: Cardiovascular;  Laterality: Right;   PERIPHERAL VASCULAR CATHETERIZATION  05/21/2015   Procedure: Lower Extremity Intervention;  Surgeon: Annice Needy, MD;  Location: [facility];  Service: Cardiovascular;;   radio-ablated therapy     completed in the pain clinic   RETINAL DETACHMENT SURGERY     on right eye x 2    Family History  Problem Relation Age of Onset   Hypertension Mother    Breast cancer Mother 11   Uterine cancer Mother        unconfirmed; dx 19s; TAH/BSO; currently 15   Hyperlipidemia Father    Bladder Cancer Paternal Grandfather 58       smoker; deceased 26   Diabetes Maternal Grandmother    Non-Hodgkin's lymphoma Paternal Aunt        deceased 37  Social History   Tobacco Use  Smoking Status Every Day   Current packs/day: 0.50   Average packs/day: 0.5 packs/day for 37.0 years (18.5 ttl pk-yrs)   Types: Cigarettes  Smokeless Tobacco Never  Tobacco Comments   1 pack will last 2-3 days--08/15/2022    Social History   Substance and Sexual Activity  Alcohol Use Yes   Alcohol/week: 0.0 standard drinks of alcohol   Comment: very occasional alcohol use - 3-4x/yr     No Known Allergies  Current Outpatient Medications  Medication Sig Dispense Refill   amitriptyline (ELAVIL) 25 MG tablet TAKE 1 TABLET BY MOUTH AT BEDTIME 30 tablet 3   atorvastatin (LIPITOR) 20 MG tablet TAKE 1 TABLET BY MOUTH AT BEDTIME 90 tablet 1   clopidogrel (PLAVIX) 75 MG tablet  TAKE 1 TABLET BY MOUTH ONCE DAILY 30 tablet 11   DULoxetine (CYMBALTA) 30 MG capsule TAKE 1 CAPSULE BY MOUTH ONCE DAILY 90 capsule 1   hydrochlorothiazide (HYDRODIURIL) 25 MG tablet TAKE 1 TABLET BY MOUTH ONCE DAILY 90 tablet 1   losartan (COZAAR) 100 MG tablet TAKE 1 TABLET BY MOUTH ONCE DAILY 90 tablet 3   metoprolol succinate (TOPROL-XL) 25 MG 24 hr tablet Take 1 tablet (25 mg total) by mouth daily. 90 tablet 3   omega-3 acid ethyl esters (LOVAZA) 1 g capsule Take 1 capsule (1 g total) by mouth daily. (Patient taking differently: Take 2 g by mouth daily.) 90 capsule 3   pantoprazole (PROTONIX) 40 MG tablet TAKE 1 TABLET BY MOUTH ONCE DAILY 90 tablet 1   vitamin B-12 (CYANOCOBALAMIN) 1000 MCG tablet Take 1,000 mcg by mouth daily.     No current facility-administered medications for this visit.    Review of Systems  Constitutional:  Negative for fever, malaise/fatigue and weight loss.  Respiratory:  Negative for cough and shortness of breath.   Cardiovascular:  Negative for chest pain.  Musculoskeletal:  Positive for back pain, joint pain and myalgias.  Neurological: Negative.      PHYSICAL EXAMINATION: BP (!) 156/96 (BP Location: Left Arm, Patient Position: Sitting, Cuff Size: Normal)   Pulse (!) 52   Resp 20   Ht 5\' 9"  (1.753 m)   Wt 164 lb 3.2 oz (74.5 kg)   SpO2 96%   BMI 24.25 kg/m  Physical Exam Constitutional:      General: He is not in acute distress.    Appearance: He is not ill-appearing.  HENT:     Head: Normocephalic and atraumatic.  Eyes:     Extraocular Movements: Extraocular movements intact.  Cardiovascular:     Rate and Rhythm: Bradycardia present.  Pulmonary:     Effort: Pulmonary effort is normal. No respiratory distress.  Abdominal:     General: Abdomen is flat. There is no distension.  Musculoskeletal:        General: Normal range of motion.     Cervical back: Normal range of motion.  Skin:    General: Skin is warm and dry.  Neurological:      General: No focal deficit present.     Mental Status: He is alert and oriented to person, place, and time.          I have independently reviewed the above radiology studies  and reviewed the findings with the patient.   Recent Lab Findings: Lab Results  Component Value Date   WBC 5.3 06/13/2022   HGB 13.7 06/13/2022   HCT 39.9 06/13/2022   PLT 228.0 06/13/2022  GLUCOSE 99 06/13/2022   CHOL 142 06/13/2022   TRIG 242.0 (H) 06/13/2022   HDL 29.40 (L) 06/13/2022   LDLDIRECT 60.0 06/13/2022   LDLCALC 60 04/24/2013   ALT 12 06/13/2022   AST 15 06/13/2022   NA 142 06/13/2022   K 3.9 06/13/2022   CL 102 06/13/2022   CREATININE 1.05 06/13/2022   BUN 19 06/13/2022   CO2 31 06/13/2022   TSH 3.28 06/13/2022   INR 1.09 05/24/2015   HGBA1C 5.6 06/13/2022    Diagnostic Studies & Laboratory data:     Recent Radiology Findings:   NM PET Image Initial (PI) Skull Base To Thigh (F-18 FDG)  Result Date: 08/29/2022 CLINICAL DATA:  Initial treatment strategy for pulmonary nodule. EXAM: NUCLEAR MEDICINE PET SKULL BASE TO THIGH TECHNIQUE: 8.9 mCi F-18 FDG was injected intravenously. Full-ring PET imaging was performed from the skull base to thigh after the radiotracer. CT data was obtained and used for attenuation correction and anatomic localization. Fasting blood glucose: 107 mg/dl COMPARISON:  Lung cancer screening CT 08/08/2022, 04/27/2021 FINDINGS: Mediastinal blood pool activity: SUV max 2.4 Liver activity: SUV max NA NECK: No hypermetabolic lymph nodes in the neck. Incidental CT findings: None. CHEST: Spiculated nodule of concern in the RIGHT upper lobe measures 19 mm (6/4) nodule has significant metabolic activity SUV max equal 5.9. RIGHT upper lobe nodule more inferiorly present on comparison exams measuring 5 mm (image 52) without metabolic. No hypermetabolic mediastinal nodes Incidental CT findings: None. ABDOMEN/PELVIS: No abnormal hypermetabolic activity within the liver, pancreas,  adrenal glands, or spleen. No hypermetabolic lymph nodes in the abdomen or pelvis. Incidental CT findings: Multiple diverticula of the descending colon and sigmoid colon without acute inflammation. Bi-iliac stents noted SKELETON: No focal hypermetabolic activity to suggest skeletal metastasis. Focal activity between the L1 and L2 spinous processes is favored degenerative/inflammatory (image 102 Incidental CT findings: Initial IMPRESSION: 1. Hypermetabolic spiculated nodule in the RIGHT upper lobe is concerning for primary bronchogenic carcinoma. 2. No evidence of metastatic adenopathy in the chest. 3. No evidence distant metastatic disease. 4. Stable nodule in the RIGHT upper lobe without metabolic activity. 5. Degenerative uptake the spinous process L1-L2. Electronically Signed   By: Genevive Bi [name]   On: 08/29/2022 14:54   CT CHEST LCS NODULE F/U LOW DOSE WO CONTRAST  Result Date: 08/11/2022 CLINICAL DATA:  Abnormal lung cancer screening CT. Thirty-eight pack-year current smoker. EXAM: CT CHEST WITHOUT CONTRAST FOR LUNG CANCER SCREENING NODULE FOLLOW-UP TECHNIQUE: Multidetector CT imaging of the chest was performed following the standard protocol without IV contrast. RADIATION DOSE REDUCTION: This exam was performed according to the departmental dose-optimization program which includes automated exposure control, adjustment of the mA and/or kV according to patient size and/or use of iterative reconstruction technique. COMPARISON:  04/29/2022, 04/27/2021. FINDINGS: Cardiovascular: Heart size normal.  No pericardial effusion. Mediastinum/Nodes: No pathologically enlarged mediastinal or axillary lymph nodes. Hilar regions are difficult to definitively evaluate without IV contrast. Esophagus is grossly unremarkable. Lungs/Pleura: Centrilobular and bullous paraseptal emphysema. Spiculated apical segment right upper lobe nodule has enlarged, now measuring 19.4 mm, previously 11.2 mm. No pleural fluid. Airway  is unremarkable. Upper Abdomen: Liver margin is slightly irregular. Visualized portions of the liver, gallbladder, adrenal glands, kidneys, spleen, pancreas, stomach and bowel are otherwise grossly unremarkable. No upper abdominal adenopathy. Musculoskeletal: Degenerative changes in the spine. No worrisome lytic or sclerotic lesions. IMPRESSION: 1. Enlarging 19.4 mm spiculated apical segment right upper lobe nodule. Lung-RADS 4X, highly suspicious. Additional imaging evaluation or consultation  with Pulmonology or Thoracic Surgery recommended. These results will be called to the ordering clinician or representative by the Radiologist Assistant, and communication documented in the PACS or [employer]. 2. Mild marginal irregularity the liver raises suspicion for cirrhosis. 3. Emphysema (ICD10-J43.9). Electronically Signed   By: Leanna Battles [name]   On: 08/11/2022 13:49     PFTs:  - FVC: 77% - FEV1: 55% -DLCO: 55%  IMPRESSION: 1. Hypermetabolic spiculated nodule in the RIGHT upper lobe is concerning for primary bronchogenic carcinoma. 2. No evidence of metastatic adenopathy in the chest. 3. No evidence distant metastatic disease. 4. Stable nodule in the RIGHT upper lobe without metabolic activity. 5. Degenerative uptake the spinous process L1-L2.    Assessment / Plan:   67yo male with 1.9cm RUL pulmonary nodule concerning for primary lung cancer.  No evidence of nodal or metastatic disease.  He has marginal lung function, and is a current smoker.  He has agreed to stop smoking today.  He will require a stress test.  We discussed the risks and benefits of R RATS, RULectomy, and he is agreeable to proceed.     I  spent 40 minutes with  the patient face to face in counseling and coordination of care.    [name] 09/02/2022 11:07 AM

## 2022-09-02 ENCOUNTER — Other Ambulatory Visit: Payer: Self-pay | Admitting: *Deleted

## 2022-09-02 ENCOUNTER — Encounter: Payer: Self-pay | Admitting: *Deleted

## 2022-09-02 ENCOUNTER — Institutional Professional Consult (permissible substitution): Payer: Medicare HMO | Admitting: Thoracic Surgery (Cardiothoracic Vascular Surgery)

## 2022-09-02 ENCOUNTER — Other Ambulatory Visit: Payer: Self-pay | Admitting: Thoracic Surgery (Cardiothoracic Vascular Surgery)

## 2022-09-02 ENCOUNTER — Encounter: Payer: Self-pay | Admitting: Thoracic Surgery (Cardiothoracic Vascular Surgery)

## 2022-09-02 VITALS — BP 156/96 | HR 52 | Resp 20 | Ht 69.0 in | Wt 164.2 lb

## 2022-09-02 DIAGNOSIS — R911 Solitary pulmonary nodule: Secondary | ICD-10-CM

## 2022-09-02 NOTE — Progress Notes (Signed)
stre

## 2022-09-05 ENCOUNTER — Other Ambulatory Visit: Payer: Self-pay | Admitting: Thoracic Surgery (Cardiothoracic Vascular Surgery)

## 2022-09-05 ENCOUNTER — Encounter (HOSPITAL_COMMUNITY): Payer: Self-pay | Admitting: Thoracic Surgery (Cardiothoracic Vascular Surgery)

## 2022-09-05 ENCOUNTER — Other Ambulatory Visit: Payer: Self-pay | Admitting: Internal Medicine

## 2022-09-05 DIAGNOSIS — R911 Solitary pulmonary nodule: Secondary | ICD-10-CM

## 2022-09-06 ENCOUNTER — Telehealth (HOSPITAL_COMMUNITY): Payer: Self-pay | Admitting: *Deleted

## 2022-09-06 ENCOUNTER — Encounter (HOSPITAL_COMMUNITY): Payer: Self-pay | Admitting: *Deleted

## 2022-09-06 NOTE — Telephone Encounter (Signed)
Letter sent to my chart with instructions for upcoming stress test.  Smith, Mary Jacqueline  

## 2022-09-07 ENCOUNTER — Ambulatory Visit (HOSPITAL_COMMUNITY): Payer: Medicare HMO | Attending: Thoracic Surgery (Cardiothoracic Vascular Surgery)

## 2022-09-07 DIAGNOSIS — Z01810 Encounter for preprocedural cardiovascular examination: Secondary | ICD-10-CM | POA: Insufficient documentation

## 2022-09-07 DIAGNOSIS — Z136 Encounter for screening for cardiovascular disorders: Secondary | ICD-10-CM | POA: Diagnosis not present

## 2022-09-07 DIAGNOSIS — R911 Solitary pulmonary nodule: Secondary | ICD-10-CM | POA: Insufficient documentation

## 2022-09-07 LAB — MYOCARDIAL PERFUSION IMAGING
Estimated workload: 1
Exercise duration (min): 1 min
Exercise duration (sec): 0 s
LV dias vol: 117 mL (ref 62–150)
LV sys vol: 54 mL
MPHR: 155 {beats}/min
Nuc Stress EF: 54 %
Peak HR: 85 {beats}/min
Percent HR: 54 %
Rest HR: 45 {beats}/min
Rest Nuclear Isotope Dose: 10.4 mCi
SDS: 3
SRS: 0
SSS: 3
ST Depression (mm): 0 mm
Stress Nuclear Isotope Dose: 32.3 mCi
TID: 1.1

## 2022-09-07 MED ORDER — REGADENOSON 0.4 MG/5ML IV SOLN
0.4000 mg | Freq: Once | INTRAVENOUS | Status: AC
  Administered 2022-09-07: 0.4 mg via INTRAVENOUS

## 2022-09-07 MED ORDER — TECHNETIUM TC 99M TETROFOSMIN IV KIT
10.4000 | PACK | Freq: Once | INTRAVENOUS | Status: AC | PRN
  Administered 2022-09-07: 10.4 via INTRAVENOUS

## 2022-09-07 MED ORDER — TECHNETIUM TC 99M TETROFOSMIN IV KIT
32.3000 | PACK | Freq: Once | INTRAVENOUS | Status: AC | PRN
  Administered 2022-09-07: 32.3 via INTRAVENOUS

## 2022-09-08 ENCOUNTER — Encounter (INDEPENDENT_AMBULATORY_CARE_PROVIDER_SITE_OTHER): Payer: Self-pay

## 2022-09-16 NOTE — Pre-Procedure Instructions (Signed)
Surgical Instructions   Your procedure is scheduled on September 21, 2022. Report to [facility] Main Entrance "A" at 5:30 A.M., then check in with the Admitting office. Any questions or running late day of surgery: call [phone]  Questions prior to your surgery date: call [phone], Monday-Friday, 8am-4pm. If you experience any cold or flu symptoms such as cough, fever, chills, shortness of breath, etc. between now and your scheduled surgery, please notify us at the above number.     Remember:  Do not eat or drink after midnight the night before your surgery    Take these medicines the morning of surgery with A SIP OF WATER: DULoxetine (CYMBALTA)  metoprolol succinate (TOPROL-XL)  pantoprazole (PROTONIX)    STOP taking clopidogrel (PLAVIX) five days prior to surgery. Your last dose will be August 22nd.   One week prior to surgery, STOP taking any Aspirin (unless otherwise instructed by your surgeon) Aleve, Naproxen, Ibuprofen, Motrin, Advil, Goody's, BC's, all herbal medications, fish oil, and non-prescription vitamins.                     Do NOT Smoke (Tobacco/Vaping) for 24 hours prior to your procedure.  If you use a CPAP at night, you may bring your mask/headgear for your overnight stay.   You will be asked to remove any contacts, glasses, piercing's, hearing aid's, dentures/partials prior to surgery. Please bring cases for these items if needed.    Patients discharged the day of surgery will not be allowed to drive home, and someone needs to stay with them for 24 hours.  SURGICAL WAITING ROOM VISITATION Patients may have no more than 2 support people in the waiting area - these visitors may rotate.   Pre-op nurse will coordinate an appropriate time for 1 ADULT support person, who may not rotate, to accompany patient in pre-op.  Children under the age of 77 must have an adult with them who is not the patient and must remain in the main waiting area with an adult.  If  the patient needs to stay at the hospital during part of their recovery, the visitor guidelines for inpatient rooms apply.  Please refer to the [facility] website for the visitor guidelines for any additional information.   If you received a COVID test during your pre-op visit  it is requested that you wear a mask when out in public, stay away from anyone that may not be feeling well and notify your surgeon if you develop symptoms. If you have been in contact with anyone that has tested positive in the last 10 days please notify you surgeon.      Pre-operative CHG Bathing Instructions   You can play a key role in reducing the risk of infection after surgery. Your skin needs to be as free of germs as possible. You can reduce the number of germs on your skin by washing with CHG (chlorhexidine gluconate) soap before surgery. CHG is an antiseptic soap that kills germs and continues to kill germs even after washing.   DO NOT use if you have an allergy to chlorhexidine/CHG or antibacterial soaps. If your skin becomes reddened or irritated, stop using the CHG and notify one of our RNs at [phone].              TAKE A SHOWER THE NIGHT BEFORE SURGERY AND THE DAY OF SURGERY    Please keep in mind the following:  DO NOT shave, including legs and underarms, 48 hours  prior to surgery.   You may shave your face before/day of surgery.  Place clean sheets on your bed the night before surgery Use a clean washcloth (not used since being washed) for each shower. DO NOT sleep with pet's night before surgery.  CHG Shower Instructions:  If you choose to wash your hair and private area, wash first with your normal shampoo/soap.  After you use shampoo/soap, rinse your hair and body thoroughly to remove shampoo/soap residue.  Turn the water OFF and apply half the bottle of CHG soap to a CLEAN washcloth.  Apply CHG soap ONLY FROM YOUR NECK DOWN TO YOUR TOES (washing for 3-5 minutes)  DO NOT use CHG soap on  face, private areas, open wounds, or sores.  Pay special attention to the area where your surgery is being performed.  If you are having back surgery, having someone wash your back for you may be helpful. Wait 2 minutes after CHG soap is applied, then you may rinse off the CHG soap.  Pat dry with a clean towel  Put on clean pajamas    Additional instructions for the day of surgery: DO NOT APPLY any lotions, deodorants, cologne, or perfumes.   Do not wear jewelry or makeup Do not wear nail polish, gel polish, artificial nails, or any other type of covering on natural nails (fingers and toes) Do not bring valuables to the hospital. [facility] is not responsible for valuables/personal belongings. Put on clean/comfortable clothes.  Please brush your teeth.  Ask your nurse before applying any prescription medications to the skin.

## 2022-09-19 ENCOUNTER — Ambulatory Visit (HOSPITAL_COMMUNITY)
Admission: RE | Admit: 2022-09-19 | Discharge: 2022-09-19 | Disposition: A | Discharge status: Home / Self Care | Payer: Medicare HMO | Source: Ambulatory Visit | Attending: Thoracic Surgery (Cardiothoracic Vascular Surgery) | Admitting: Thoracic Surgery (Cardiothoracic Vascular Surgery)

## 2022-09-19 ENCOUNTER — Encounter (HOSPITAL_COMMUNITY): Payer: Self-pay

## 2022-09-19 ENCOUNTER — Other Ambulatory Visit: Payer: Self-pay

## 2022-09-19 ENCOUNTER — Other Ambulatory Visit: Payer: Self-pay | Admitting: *Deleted

## 2022-09-19 ENCOUNTER — Encounter (HOSPITAL_COMMUNITY): Payer: Self-pay | Admitting: Vascular Surgery

## 2022-09-19 ENCOUNTER — Encounter (HOSPITAL_COMMUNITY)
Admission: RE | Admit: 2022-09-19 | Discharge: 2022-09-19 | Disposition: A | Discharge status: Home / Self Care | Payer: Medicare HMO | Source: Ambulatory Visit | Attending: Thoracic Surgery (Cardiothoracic Vascular Surgery) | Admitting: Thoracic Surgery (Cardiothoracic Vascular Surgery)

## 2022-09-19 VITALS — BP 162/95 | HR 51 | Temp 98.3°F | Resp 20 | Ht 69.0 in | Wt 167.1 lb

## 2022-09-19 DIAGNOSIS — Z01818 Encounter for other preprocedural examination: Secondary | ICD-10-CM | POA: Diagnosis not present

## 2022-09-19 DIAGNOSIS — R911 Solitary pulmonary nodule: Secondary | ICD-10-CM | POA: Diagnosis not present

## 2022-09-19 DIAGNOSIS — Z1152 Encounter for screening for COVID-19: Secondary | ICD-10-CM | POA: Diagnosis not present

## 2022-09-19 DIAGNOSIS — G5601 Carpal tunnel syndrome, right upper limb: Secondary | ICD-10-CM

## 2022-09-19 DIAGNOSIS — R001 Bradycardia, unspecified: Secondary | ICD-10-CM | POA: Diagnosis not present

## 2022-09-19 LAB — URINALYSIS, ROUTINE W REFLEX MICROSCOPIC
Bilirubin Urine: NEGATIVE
Glucose, UA: NEGATIVE mg/dL
Hgb urine dipstick: NEGATIVE
Ketones, ur: NEGATIVE mg/dL
Leukocytes,Ua: NEGATIVE
Nitrite: NEGATIVE
Protein, ur: NEGATIVE mg/dL
Specific Gravity, Urine: 1.009 (ref 1.005–1.030)
pH: 6 (ref 5.0–8.0)

## 2022-09-19 LAB — CBC
HCT: 40.5 % (ref 39.0–52.0)
Hemoglobin: 13.9 g/dL (ref 13.0–17.0)
MCH: 31.4 pg (ref 26.0–34.0)
MCHC: 34.3 g/dL (ref 30.0–36.0)
MCV: 91.6 fL (ref 80.0–100.0)
Platelets: 218 10*3/uL (ref 150–400)
RBC: 4.42 MIL/uL (ref 4.22–5.81)
RDW: 12.6 % (ref 11.5–15.5)
WBC: 5.9 10*3/uL (ref 4.0–10.5)
nRBC: 0 % (ref 0.0–0.2)

## 2022-09-19 LAB — COMPREHENSIVE METABOLIC PANEL
ALT: 17 U/L (ref 0–44)
AST: 19 U/L (ref 15–41)
Albumin: 4.1 g/dL (ref 3.5–5.0)
Alkaline Phosphatase: 59 U/L (ref 38–126)
Anion gap: 10 (ref 5–15)
BUN: 13 mg/dL (ref 8–23)
CO2: 28 mmol/L (ref 22–32)
Calcium: 9.4 mg/dL (ref 8.9–10.3)
Chloride: 101 mmol/L (ref 98–111)
Creatinine, Ser: 1.08 mg/dL (ref 0.61–1.24)
GFR, Estimated: >60 mL/min (ref >60)
Glucose, Bld: 106 mg/dL — ABNORMAL HIGH (ref 70–99)
Potassium: 4.1 mmol/L (ref 3.5–5.1)
Sodium: 139 mmol/L (ref 135–145)
Total Bilirubin: 1 mg/dL (ref 0.3–1.2)
Total Protein: 7.2 g/dL (ref 6.5–8.1)

## 2022-09-19 LAB — PROTIME-INR
INR: 0.9 (ref 0.8–1.2)
Prothrombin Time: 12.8 seconds (ref 11.4–15.2)

## 2022-09-19 LAB — TYPE AND SCREEN
ABO/RH(D): A POS
Antibody Screen: NEGATIVE

## 2022-09-19 LAB — APTT: aPTT: 28 seconds (ref 24–36)

## 2022-09-19 LAB — SARS CORONAVIRUS 2 (TAT 6-24 HRS): SARS Coronavirus 2: NEGATIVE

## 2022-09-19 LAB — SURGICAL PCR SCREEN
MRSA, PCR: NEGATIVE
Staphylococcus aureus: POSITIVE — AB

## 2022-09-19 NOTE — Pre-Procedure Instructions (Signed)
Surgical Instructions  Your procedure is scheduled on September 21, 2022. Report to [facility] Main Entrance "A" at 5:30 A.M., then check in with the Admitting office. Any questions or running late day of surgery: call [phone]  Questions prior to your surgery date: call [phone], Monday-Friday, 8am-4pm. If you experience any cold or flu symptoms such as cough, fever, chills, shortness of breath, etc. between now and your scheduled surgery, please notify us at the above number.    Remember:  Do not eat or drink after midnight the night before your surgery   Take these medicines the morning of surgery with A SIP OF WATER: DULoxetine (CYMBALTA)  metoprolol succinate (TOPROL-XL)  pantoprazole (PROTONIX)   STOP taking clopidogrel (PLAVIX) five days prior to surgery. Your last dose will be August 22nd.  One week prior to surgery, STOP taking any Aspirin (unless otherwise instructed by your surgeon) Aleve, Naproxen, Ibuprofen, Motrin, Advil, Goody's, BC's, all herbal medications, fish oil, and non-prescription vitamins.          Do NOT Smoke (Tobacco/Vaping) for 24 hours prior to your procedure.  If you use a CPAP at night, you may bring your mask/headgear for your overnight stay.   You will be asked to remove any contacts, glasses, piercing's, hearing aid's, dentures/partials prior to surgery. Please bring cases for these items if needed.     SURGICAL WAITING ROOM VISITATION Patients may have no more than 2 support people in the waiting area - these visitors may rotate.   Pre-op nurse will coordinate an appropriate time for 1 ADULT support person, who may not rotate, to accompany patient in pre-op.  Children under the age of 34 must have an adult with them who is not the patient and must remain in the main waiting area with an adult.  If the patient needs to stay at the hospital during part of their recovery, the visitor guidelines for inpatient rooms apply.  Please refer to the  [facility] website for the visitor guidelines for any additional information.  If you received a COVID test during your pre-op visit  it is requested that you wear a mask when out in public, stay away from anyone that may not be feeling well and notify your surgeon if you develop symptoms. If you have been in contact with anyone that has tested positive in the last 10 days please notify you surgeon.      Pre-operative CHG Bathing Instructions   You can play a key role in reducing the risk of infection after surgery. Your skin needs to be as free of germs as possible. You can reduce the number of germs on your skin by washing with CHG (chlorhexidine gluconate) soap before surgery. CHG is an antiseptic soap that kills germs and continues to kill germs even after washing.   DO NOT use if you have an allergy to chlorhexidine/CHG or antibacterial soaps. If your skin becomes reddened or irritated, stop using the CHG and notify one of our RNs at [phone].              TAKE A SHOWER THE NIGHT BEFORE SURGERY AND THE DAY OF SURGERY    Please keep in mind the following:  DO NOT shave, including legs and underarms, 48 hours prior to surgery.   You may shave your face before/day of surgery.  Place clean sheets on your bed the night before surgery Use a clean washcloth (not used since being washed) for each shower. DO NOT sleep with  pet's night before surgery.  CHG Shower Instructions:  If you choose to wash your hair and private area, wash first with your normal shampoo/soap.  After you use shampoo/soap, rinse your hair and body thoroughly to remove shampoo/soap residue.  Turn the water OFF and apply half the bottle of CHG soap to a CLEAN washcloth.  Apply CHG soap ONLY FROM YOUR NECK DOWN TO YOUR TOES (washing for 3-5 minutes)  DO NOT use CHG soap on face, private areas, open wounds, or sores.  Pay special attention to the area where your surgery is being performed.  If you are having back  surgery, having someone wash your back for you may be helpful. Wait 2 minutes after CHG soap is applied, then you may rinse off the CHG soap.  Pat dry with a clean towel  Put on clean pajamas    Additional instructions for the day of surgery: DO NOT APPLY any lotions, deodorants or cologne.   Do not wear jewelry. Do not bring valuables to the hospital. [facility] is not responsible for valuables/personal belongings. Put on clean/comfortable clothes.  Please brush your teeth.  Ask your nurse before applying any prescription medications to the skin.

## 2022-09-19 NOTE — Progress Notes (Signed)
PCP - Dr. Dale Stover   Chest x-ray - today EKG - today Stress Test - 09/07/22 ECHO - ordered  Sleep Study -  CPAP - yes  Blood Thinner Instructions: Plavix - last dose 09/15/22   COVID TEST- done today 09/19/22  Anesthesia review: yes, Revonda Standard is reviewing  Patient denies shortness of breath, fever, cough and chest pain at PAT appointment   All instructions explained to the patient, with a verbal understanding of the material. Patient agrees to go over the instructions while at home for a better understanding. Patient also instructed to self quarantine after being tested for COVID-19. The opportunity to ask questions was provided.

## 2022-09-21 ENCOUNTER — Encounter (HOSPITAL_COMMUNITY): Admission: RE | Payer: Self-pay | Source: Home / Self Care

## 2022-09-21 ENCOUNTER — Inpatient Hospital Stay (HOSPITAL_COMMUNITY)
Admission: RE | Admit: 2022-09-21 | Payer: Medicare HMO | Source: Home / Self Care | Admitting: Thoracic Surgery (Cardiothoracic Vascular Surgery)

## 2022-09-21 SURGERY — XI ROBOTIC ASSISTED THORACOSCOPY-LOBECTOMY
Anesthesia: General | Site: Chest | Laterality: Right

## 2022-09-23 ENCOUNTER — Encounter: Payer: Self-pay | Admitting: Internal Medicine

## 2022-09-23 ENCOUNTER — Ambulatory Visit: Payer: Medicare HMO | Attending: Cardiovascular Disease | Admitting: Cardiovascular Disease

## 2022-09-23 ENCOUNTER — Encounter: Payer: Self-pay | Admitting: Cardiovascular Disease

## 2022-09-23 VITALS — BP 118/80 | HR 66 | Ht 69.0 in | Wt 165.8 lb

## 2022-09-23 DIAGNOSIS — I700 Atherosclerosis of aorta: Secondary | ICD-10-CM | POA: Diagnosis not present

## 2022-09-23 DIAGNOSIS — I2510 Atherosclerotic heart disease of native coronary artery without angina pectoris: Secondary | ICD-10-CM

## 2022-09-23 DIAGNOSIS — Z01818 Encounter for other preprocedural examination: Secondary | ICD-10-CM | POA: Diagnosis not present

## 2022-09-23 MED ORDER — METOPROLOL TARTRATE 100 MG PO TABS: 100.0000 mg | ORAL_TABLET | Freq: Once | ORAL | 0 refills | Status: DC

## 2022-09-23 NOTE — Progress Notes (Signed)
  Cardiology Office Note:  .   Date:  09/23/2022  ID:  Travis Gray, DOB April 09, 1955, MRN [id number] PCP: Dale Lyden, MD  [facility] Providers Cardiologist:  Elior Robinette  Click to update primary MD,subspecialty MD or APP then REFRESH:1}   History of Present Illness: .   Travis Gray is a 66 y.o. male with a recent diagnosis of a lung nodule.  Seen with wife, [name] .  He has seen Dr. Cliffton Asters and was scheduled for resection of his lung nodule.  He has a long history of cigarette smoking.  As part of preop evaluation he had a Lexiscan Myoview study.  He was found to have mildly reduced LVEF of 54%.  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  No CP, no dyspnea   Several years ago he went to the ER in [facility] for CP ( CP for a day ) Work up was negative   [place], is very active  Is limited by some back pain , hip pain   Stopped smoking on Aug. 9,  2024  Lipids from May, 2024 Total cholesterol is 142 HDL is 29 LDL is 60 Triglyceride levels 242.  Eats lots of carbs, dietary fats       ROS:   Studies Reviewed: Marland Kitchen        Lexiscan myoview  - which shows a fixed inferior wall defect.   Risk Assessment/Calculations:             Physical Exam:   VS:  BP 118/80   Pulse 66   Ht 5\' 9"  (1.753 m)   Wt 165 lb 12.8 oz (75.2 kg)   SpO2 98%   BMI 24.48 kg/m    Wt Readings from Last 3 Encounters:  09/23/22 165 lb 12.8 oz (75.2 kg)  09/19/22 167 lb 1.6 oz (75.8 kg)  09/02/22 164 lb 3.2 oz (74.5 kg)    GEN: Well nourished, well developed in no acute distress NECK: No JVD; No carotid bruits CARDIAC: RRR, no murmurs, rubs, gallops RESPIRATORY:  Clear to auscultation without rales, wheezing or rhonchi  ABDOMEN: Soft, non-tender, non-distended EXTREMITIES:  No edema; No deformity   ASSESSMENT AND PLAN: .    1.  Coronary artery disease: Doing presents for further evaluation prior to resection of the lung mass.   Lexiscan Myoview study revealed a fixed inferior wall defect.  He has no symptoms.  He does not recall having any severe episodes of chest pain.  At this point we need to rule out significant CAD involving his LAD and left circumflex artery.  Will schedule him for a coronary CTA for further evaluation.  If this looks good then we will be able to clear him for his surgery.  If he has severe calcifications or questionable disease on his anterior lateral wall then we may need to perform a heart catheterization.  I will plan on seeing him back in 6 months for follow-up visit.  2.  Hyperlipidemia: Continue current dose of rosuvastatin.  His triglyceride levels remain elevated.  I have advised him to reduce his intake of carbohydrates.         Dispo:  6 months   Signed, Kristeen Miss, MD

## 2022-09-23 NOTE — Patient Instructions (Signed)
Medication Instructions:   Your physician recommends that you continue on your current medications as directed. Please refer to the Current Medication list given to you today.  *If you need a refill on your cardiac medications before your next appointment, please call your pharmacy*    Testing/Procedures:    Your cardiac CT will be scheduled at one of the below locations:   [facility] [address] [place], [place] [address] [phone]   If scheduled at [facility], please arrive at the [facility] and Children's Entrance (Entrance C2) of [facility] 30 minutes prior to test start time. You can use the FREE valet parking offered at entrance C (encouraged to control the heart rate for the test)  Proceed to the [facility] Radiology Department (first floor) to check-in and test prep.  All radiology patients and guests should use entrance C2 at [facility], accessed from [facility], even though the hospital's physical address listed is [address].      There is spacious parking and easy access to the radiology department from the [facility] entrance. Please enter here and check-in with the desk attendant.   Please follow these instructions carefully (unless otherwise directed):  An IV will be required for this test and Nitroglycerin will be given.  Hold all erectile dysfunction medications at least 3 days (72 hrs) prior to test. (Ie viagra, cialis, sildenafil, tadalafil, etc)   On the Night Before the Test: Be sure to Drink plenty of water. Do not consume any caffeinated/decaffeinated beverages or chocolate 12 hours prior to your test. Do not take any antihistamines 12 hours prior to your test.   On the Day of the Test: Drink plenty of water until 1 hour prior to the test. Do not eat any food 1 hour prior to test. You may take your regular medications prior to the test.  Take metoprolol TARTRATE 100  MG  (Lopressor) two hours prior to test.  PLEASE HOLD YOUR SCHEDULED TOPROL XL AND TAKE THIS INSTEAD 2 HOURS PRIOR TO CT If you take Hydrochlorothiazide please HOLD on the morning of the test.        After the Test: Drink plenty of water. After receiving IV contrast, you may experience a mild flushed feeling. This is normal. On occasion, you may experience a mild rash up to 24 hours after the test. This is not dangerous. If this occurs, you can take Benadryl 25 mg and increase your fluid intake. If you experience trouble breathing, this can be serious. If it is severe call 911 IMMEDIATELY. If it is mild, please call our office.   We will call to schedule your test 2-4 weeks out understanding that some insurance companies will need an authorization prior to the service being performed.   For more information and frequently asked questions, please visit our website : [email or web address]  For non-scheduling related questions, please contact the cardiac imaging nurse navigator should you have any questions/concerns: Cardiac Imaging Nurse Navigators Direct Office Dial: [phone]   For scheduling needs, including cancellations and rescheduling, please call [place], [phone].    Follow-Up: At [facility], you and your health needs are our priority.  As part of our continuing mission to provide you with exceptional heart care, we have created designated Provider Care Teams.  These Care Teams include your primary Cardiologist (physician) and Advanced Practice Providers (APPs -  Physician Assistants and Nurse Practitioners) who all work together to provide  you with the care you need, when you need it.  We recommend signing up for the patient portal called "MyChart".  Sign up information is provided on this After Visit Summary.  MyChart is used to connect with patients for Virtual Visits (Telemedicine).  Patients are able to view lab/test results, encounter notes,  upcoming appointments, etc.  Non-urgent messages can be sent to your provider as well.   To learn more about what you can do with MyChart, go to [email or web address].    Your next appointment:   6 month(s)  Provider:   DR. Elease Hashimoto

## 2022-09-26 NOTE — Telephone Encounter (Signed)
Called and spoke to [place].  He is doing well. Planning for CTA Thursday and f/u with Dr Elease Hashimoto after.  Very comfortable with plan.

## 2022-09-27 ENCOUNTER — Encounter (HOSPITAL_COMMUNITY): Payer: Self-pay

## 2022-09-28 ENCOUNTER — Telehealth (HOSPITAL_COMMUNITY): Payer: Self-pay | Admitting: *Deleted

## 2022-09-28 NOTE — Telephone Encounter (Signed)
Reaching out to patient to offer assistance regarding upcoming cardiac imaging study; pt verbalizes understanding of appt date/time, parking situation and where to check in, pre-test NPO status and medications ordered, and verified current allergies; name and call back number provided for further questions should they arise Hayley Sharpe RN Navigator Cardiac [facility] [phone] office [phone] cell  

## 2022-09-29 ENCOUNTER — Encounter: Payer: Self-pay | Admitting: Internal Medicine

## 2022-09-29 ENCOUNTER — Ambulatory Visit (HOSPITAL_COMMUNITY)
Admission: RE | Admit: 2022-09-29 | Discharge: 2022-09-29 | Disposition: A | Discharge status: Home / Self Care | Payer: Medicare HMO | Source: Ambulatory Visit | Attending: Cardiovascular Disease | Admitting: Cardiovascular Disease

## 2022-09-29 DIAGNOSIS — Z01818 Encounter for other preprocedural examination: Secondary | ICD-10-CM | POA: Diagnosis not present

## 2022-09-29 DIAGNOSIS — I2510 Atherosclerotic heart disease of native coronary artery without angina pectoris: Secondary | ICD-10-CM | POA: Diagnosis not present

## 2022-09-29 DIAGNOSIS — I700 Atherosclerosis of aorta: Secondary | ICD-10-CM

## 2022-09-29 MED ORDER — NITROGLYCERIN 0.4 MG SL SUBL
SUBLINGUAL_TABLET | SUBLINGUAL | Status: AC
  Filled 2022-09-29: qty 2

## 2022-09-29 MED ORDER — IOHEXOL 350 MG/ML SOLN
100.0000 mL | Freq: Once | INTRAVENOUS | Status: AC | PRN
  Administered 2022-09-29: 100 mL via INTRAVENOUS

## 2022-09-29 MED ORDER — NITROGLYCERIN 0.4 MG SL SUBL
0.8000 mg | SUBLINGUAL_TABLET | Freq: Once | SUBLINGUAL | Status: AC
  Administered 2022-09-29: 0.8 mg via SUBLINGUAL

## 2022-09-30 MED ORDER — NYSTATIN 100000 UNIT/GM EX CREA: 1.0000 | TOPICAL_CREAM | Freq: Two times a day (BID) | CUTANEOUS | 0 refills | Status: DC

## 2022-09-30 NOTE — Telephone Encounter (Signed)
Rx sent in for nystatin cream.  Let us know if does not improve.  Try to keep area dry

## 2022-09-30 NOTE — Telephone Encounter (Signed)
Red irritation in the groin area- both sides. Says it does not really itch but it is red and feels raw. He has been using OTC spray and ointment but could not give me the name of them. He has been sweating a lot and cannot get it to clear up. Requesting to have something prescription sent in to put on it. Ok to send in nystatin powder and/or cream? Rx needs to go to [facility]. I can send in if ok with you

## 2022-09-30 NOTE — Telephone Encounter (Signed)
Patient aware of below.

## 2022-10-10 ENCOUNTER — Encounter: Payer: Self-pay | Admitting: Cardiovascular Disease

## 2022-10-10 ENCOUNTER — Encounter: Payer: Self-pay | Admitting: Internal Medicine

## 2022-10-10 NOTE — Telephone Encounter (Signed)
Trisha - Please see me about this.  Dr Elease Hashimoto ordered CTA.  Reviewed messages.

## 2022-10-10 NOTE — Telephone Encounter (Signed)
Per OV note by Nahser on 09/23/22: At this point we need to rule out significant CAD involving his LAD and left circumflex artery. Will schedule him for a coronary CTA for further evaluation. If this looks good then we will be able to clear him for his surgery. If he has severe calcifications or questionable disease on his anterior lateral wall then we may need to perform a heart catheterization.   Results from Coronary CTA state:  LAD is a large vessel that gives rise to two diagonal branches. There is no significant plaque.   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 Minimal soft and calcified plaque stenoses (1-24%) and a mild luminal narrowing (25-49%) in the body of the LCX without discrete plaque. 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 There is no slit like orifice.  Clearance from surgery should be fine based on these findings, but sending to [employer] for official word.

## 2022-10-11 NOTE — Telephone Encounter (Signed)
Advised patient per Dr Harvie Bridge RN, surgery should be fine but waiting on official word from Dr Elease Hashimoto. Patient is going to reach out to them tomorrow if has not heard anything.

## 2022-10-11 NOTE — Telephone Encounter (Signed)
Patient is calling requesting a callback to discuss this mychart message.   Please advise.

## 2022-10-12 ENCOUNTER — Encounter: Payer: Self-pay | Admitting: Internal Medicine

## 2022-10-12 ENCOUNTER — Ambulatory Visit (INDEPENDENT_AMBULATORY_CARE_PROVIDER_SITE_OTHER): Payer: Medicare HMO | Admitting: Internal Medicine

## 2022-10-12 VITALS — BP 128/74 | HR 62 | Temp 97.9°F | Resp 16 | Ht 69.0 in | Wt 171.4 lb

## 2022-10-12 DIAGNOSIS — I10 Essential (primary) hypertension: Secondary | ICD-10-CM | POA: Diagnosis not present

## 2022-10-12 DIAGNOSIS — E7800 Pure hypercholesterolemia, unspecified: Secondary | ICD-10-CM | POA: Diagnosis not present

## 2022-10-12 DIAGNOSIS — I2510 Atherosclerotic heart disease of native coronary artery without angina pectoris: Secondary | ICD-10-CM

## 2022-10-12 DIAGNOSIS — K219 Gastro-esophageal reflux disease without esophagitis: Secondary | ICD-10-CM | POA: Diagnosis not present

## 2022-10-12 DIAGNOSIS — D649 Anemia, unspecified: Secondary | ICD-10-CM | POA: Diagnosis not present

## 2022-10-12 DIAGNOSIS — Z0000 Encounter for general adult medical examination without abnormal findings: Secondary | ICD-10-CM | POA: Diagnosis not present

## 2022-10-12 DIAGNOSIS — M545 Low back pain, unspecified: Secondary | ICD-10-CM | POA: Diagnosis not present

## 2022-10-12 DIAGNOSIS — I4949 Other premature depolarization: Secondary | ICD-10-CM | POA: Diagnosis not present

## 2022-10-12 DIAGNOSIS — I700 Atherosclerosis of aorta: Secondary | ICD-10-CM | POA: Diagnosis not present

## 2022-10-12 DIAGNOSIS — R739 Hyperglycemia, unspecified: Secondary | ICD-10-CM

## 2022-10-12 DIAGNOSIS — Z720 Tobacco use: Secondary | ICD-10-CM

## 2022-10-12 DIAGNOSIS — I739 Peripheral vascular disease, unspecified: Secondary | ICD-10-CM

## 2022-10-12 DIAGNOSIS — R911 Solitary pulmonary nodule: Secondary | ICD-10-CM

## 2022-10-12 DIAGNOSIS — Z86010 Personal history of colonic polyps: Secondary | ICD-10-CM

## 2022-10-12 LAB — BASIC METABOLIC PANEL WITH GFR
BUN: 16 mg/dL (ref 6–23)
CO2: 32 meq/L (ref 19–32)
Calcium: 8.9 mg/dL (ref 8.4–10.5)
Chloride: 101 meq/L (ref 96–112)
Creatinine, Ser: 1.05 mg/dL (ref 0.40–1.50)
GFR: 74.54 mL/min (ref >60.00)
Glucose, Bld: 92 mg/dL (ref 70–99)
Potassium: 4 meq/L (ref 3.5–5.1)
Sodium: 140 meq/L (ref 135–145)

## 2022-10-12 LAB — HEPATIC FUNCTION PANEL
ALT: 12 U/L (ref 0–53)
AST: 14 U/L (ref 0–37)
Albumin: 4 g/dL (ref 3.5–5.2)
Alkaline Phosphatase: 62 U/L (ref 39–117)
Bilirubin, Direct: 0.1 mg/dL (ref 0.0–0.3)
Total Bilirubin: 0.4 mg/dL (ref 0.2–1.2)
Total Protein: 6.2 g/dL (ref 6.0–8.3)

## 2022-10-12 LAB — LIPID PANEL
Cholesterol: 162 mg/dL (ref 0–200)
HDL: 27.8 mg/dL — ABNORMAL LOW (ref >39.00)
Total CHOL/HDL Ratio: 6
Triglycerides: 466 mg/dL — ABNORMAL HIGH (ref 0.0–149.0)

## 2022-10-12 LAB — LDL CHOLESTEROL, DIRECT: Direct LDL: 60 mg/dL

## 2022-10-12 LAB — HEMOGLOBIN A1C: Hgb A1c MFr Bld: 5.8 % (ref 4.6–6.5)

## 2022-10-12 NOTE — Assessment & Plan Note (Signed)
Saw Dr Wyn Quaker 02/04/22 - f/u PAD. Is status post bilateral iliac stent placement as well as right infrainguinal revascularization. Recommended to continue plavix and lipitor.  F/u one year.

## 2022-10-12 NOTE — Assessment & Plan Note (Signed)
Continue lipitor  ?

## 2022-10-12 NOTE — Progress Notes (Signed)
Subjective:    Patient ID: Travis Gray, male    DOB: January 04, 1956, 67 y.o.   MRN: [id number]  Patient here for  Chief Complaint  Patient presents with   Annual Exam    HPI Here for a scheduled physical. He has seen Dr. Cliffton Asters and was scheduled for resection of his lung nodule.  He has a long history of cigarette smoking. As part of preop evaluation he had a Lexiscan Myoview study.  He was found to have mildly reduced LVEF of 54%.  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 Recommended coronary CTA. CTA 09/29/22 - Coronary calcium score of 61. This was 47th percentile for age. 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 There is a retro-aortic course with only one high risk feature: Acute angle take off. CAD-RADS 2. Mild non-obstructive CAD (25-49%). Consider non-atherosclerotic causes of chest pain. Consider preventive therapy and risk factor modification.  Once cardiology reviews - will plan for lung nodule removal.  He stays active.  Reports no chest pain or sob with increased activity or exertion.  Does report that in the last 24-48 hours, he has noticed some cough and tightness when cough.  Denies sob.  No acid reflux reported.  No abdominal pain or bowel change reported.  Does report left low back pain.  No radiation of pain down leg.  No numbness or tingling.  Right low back pain resolved.  No urine change.    Past Medical History:  Diagnosis Date   Chronic headaches    migraines, 3-4x/yr   Degenerative disc disease, cervical    Genetic testing 03/20/2017   Multi-Cancer panel (83 genes) @ [facility] - No pathogenic mutations detected   GERD (gastroesophageal reflux disease)    Hypercholesterolemia    Hypertension    Obesity    Peripheral vascular disease (HCC)    groin and right leg stents   Skin cancer    arm/ face area.  Has had removed   Sleep apnea    CPAP   Tobacco abuse    Past Surgical History:   Procedure Laterality Date   CARDIAC CATHETERIZATION     CATARACT EXTRACTION W/PHACO Left 04/11/2018   Procedure: CATARACT EXTRACTION PHACO AND INTRAOCULAR LENS PLACEMENT (IOC)  COMPLICATED LEFT;  Surgeon: Lockie Mola, MD;  [facility]: [facility];  Service: Ophthalmology;  Laterality: Left;  OMIDRIA sleep apnea   COLONOSCOPY WITH PROPOFOL N/A 11/25/2016   Procedure: COLONOSCOPY WITH PROPOFOL;  Surgeon: Toledo, Boykin Nearing, MD;  Location: [facility];  Service: Endoscopy;  Laterality: N/A;   ESOPHAGOGASTRODUODENOSCOPY (EGD) WITH PROPOFOL N/A 11/25/2016   Procedure: ESOPHAGOGASTRODUODENOSCOPY (EGD) WITH PROPOFOL;  Surgeon: Toledo, Boykin Nearing, MD;  Location: [facility];  Service: Endoscopy;  Laterality: N/A;   HERNIA REPAIR     Inguinal,laparoscopic surgery   HOLEP-LASER ENUCLEATION OF THE PROSTATE WITH MORCELLATION N/A 03/19/2021   Procedure: HOLEP-LASER ENUCLEATION OF THE PROSTATE WITH MORCELLATION;  Surgeon: Sondra Come, MD;  Location: [facility];  Service: Urology;  Laterality: N/A;   PERIPHERAL VASCULAR CATHETERIZATION Right 05/21/2015   Procedure: Lower Extremity Angiography;  Surgeon: Annice Needy, MD;  Location: [facility];  Service: Cardiovascular;  Laterality: Right;   PERIPHERAL VASCULAR CATHETERIZATION  05/21/2015   Procedure: Lower Extremity Intervention;  Surgeon: Annice Needy, MD;  Location: [facility];  Service: Cardiovascular;;   radio-ablated therapy     completed in the pain clinic   RETINAL DETACHMENT SURGERY  on right eye x 2   Family History  Problem Relation Age of Onset   Hypertension Mother    Breast cancer Mother 79   Uterine cancer Mother        unconfirmed; dx 2s; TAH/BSO; currently 21   Hyperlipidemia Father    Bladder Cancer Paternal Grandfather 81       smoker; deceased 75   Diabetes Maternal Grandmother    Non-Hodgkin's lymphoma Paternal Aunt        deceased 38   Social History   Socioeconomic History    Marital status: Married    Spouse name: Not on file   Number of children: Not on file   Years of education: Not on file   Highest education level: Not on file  Occupational History   Not on file  Tobacco Use   Smoking status: Former    Current packs/day: 0.00    Average packs/day: 0.5 packs/day for 37.0 years (18.5 ttl pk-yrs)    Types: Cigarettes    Quit date: 09/02/2022    Years since quitting: 0.1    Passive exposure: Past   Smokeless tobacco: Never   Tobacco comments:    1 pack will last 2-3 days--08/15/2022  Vaping Use   Vaping status: Some Days  Substance and Sexual Activity   Alcohol use: Yes    Alcohol/week: 0.0 standard drinks of alcohol    Comment: very occasional alcohol use - 3-4x/yr   Drug use: No   Sexual activity: Not on file  Other Topics Concern   Not on file  Social History Narrative   Not on file   Social Determinants of Health   Financial Resource Strain: Not on file  Food Insecurity: No Food Insecurity (08/24/2021)   Hunger Vital Sign    Worried About Running Out of Food in the Last Year: Never true    Ran Out of Food in the Last Year: Never true  Transportation Needs: No Transportation Needs (08/24/2021)   PRAPARE - Administrator, Civil Service (Medical): No    Lack of Transportation (Non-Medical): No  Physical Activity: Not on file  Stress: Not on file  Social Connections: Not on file     Review of Systems  Constitutional:  Negative for appetite change and unexpected weight change.  HENT:  Negative for congestion and sinus pressure.   Respiratory:  Positive for cough. Negative for chest tightness and shortness of breath.   Cardiovascular:  Negative for chest pain, palpitations and leg swelling.  Gastrointestinal:  Negative for abdominal pain, diarrhea, nausea and vomiting.  Genitourinary:  Negative for difficulty urinating and dysuria.  Musculoskeletal:  Positive for back pain. Negative for joint swelling and myalgias.  Skin:   Negative for color change and rash.  Neurological:  Negative for dizziness and headaches.  Psychiatric/Behavioral:  Negative for agitation and dysphoric mood.        Objective:     BP 128/74   Pulse 62   Temp 97.9 F (36.6 C)   Resp 16   Ht 5\' 9"  (1.753 m)   Wt 171 lb 6.4 oz (77.7 kg)   SpO2 97%   BMI 25.31 kg/m  Wt Readings from Last 3 Encounters:  10/12/22 171 lb 6.4 oz (77.7 kg)  09/23/22 165 lb 12.8 oz (75.2 kg)  09/19/22 167 lb 1.6 oz (75.8 kg)    Physical Exam Vitals reviewed.  Constitutional:      General: He is not in acute distress.    Appearance:  Normal appearance. He is well-developed.  HENT:     Head: Normocephalic and atraumatic.     Right Ear: External ear normal.     Left Ear: External ear normal.  Eyes:     General: No scleral icterus.       Right eye: No discharge.        Left eye: No discharge.     Conjunctiva/sclera: Conjunctivae normal.  Cardiovascular:     Rate and Rhythm: Normal rate and regular rhythm.     Comments: Occasional premature beat.  Pulmonary:     Effort: Pulmonary effort is normal. No respiratory distress.     Breath sounds: Normal breath sounds.     Comments: No wheezing with forced expiration.  Abdominal:     General: Bowel sounds are normal.     Palpations: Abdomen is soft.     Tenderness: There is no abdominal tenderness.  Musculoskeletal:        General: No swelling or tenderness.     Cervical back: Neck supple. No tenderness.  Lymphadenopathy:     Cervical: No cervical adenopathy.  Skin:    Findings: No erythema or rash.  Neurological:     Mental Status: He is alert.  Psychiatric:        Mood and Affect: Mood normal.        Behavior: Behavior normal.      Outpatient Encounter Medications as of 10/12/2022  Medication Sig   amitriptyline (ELAVIL) 25 MG tablet TAKE 1 TABLET BY MOUTH AT BEDTIME   atorvastatin (LIPITOR) 20 MG tablet TAKE 1 TABLET BY MOUTH AT BEDTIME   clopidogrel (PLAVIX) 75 MG tablet TAKE 1  TABLET BY MOUTH ONCE DAILY   DULoxetine (CYMBALTA) 30 MG capsule TAKE 1 CAPSULE BY MOUTH ONCE DAILY   hydrochlorothiazide (HYDRODIURIL) 25 MG tablet TAKE 1 TABLET BY MOUTH ONCE DAILY   losartan (COZAAR) 100 MG tablet TAKE 1 TABLET BY MOUTH ONCE DAILY   metoprolol succinate (TOPROL-XL) 25 MG 24 hr tablet Take 1 tablet (25 mg total) by mouth daily.   nystatin cream (MYCOSTATIN) Apply 1 Application topically 2 (two) times daily.   Omega-3 Fatty Acids (FISH OIL PO) Take 2 capsules by mouth daily.   pantoprazole (PROTONIX) 40 MG tablet TAKE 1 TABLET BY MOUTH ONCE DAILY   vitamin B-12 (CYANOCOBALAMIN) 1000 MCG tablet Take 1,000 mcg by mouth daily.   [DISCONTINUED] metoprolol tartrate (LOPRESSOR) 100 MG tablet Take 1 tablet (100 mg total) by mouth once for 1 dose. Take 90-120 minutes prior to scan. Hold for SBP less than 110.   No facility-administered encounter medications on file as of 10/12/2022.     Lab Results  Component Value Date   WBC 5.9 09/19/2022   HGB 13.9 09/19/2022   HCT 40.5 09/19/2022   PLT 218 09/19/2022   GLUCOSE 106 (H) 09/19/2022   CHOL 142 06/13/2022   TRIG 242.0 (H) 06/13/2022   HDL 29.40 (L) 06/13/2022   LDLDIRECT 60.0 06/13/2022   LDLCALC 60 04/24/2013   ALT 17 09/19/2022   AST 19 09/19/2022   NA 139 09/19/2022   K 4.1 09/19/2022   CL 101 09/19/2022   CREATININE 1.08 09/19/2022   BUN 13 09/19/2022   CO2 28 09/19/2022   TSH 3.28 06/13/2022   PSA 0.18 06/13/2022   INR 0.9 09/19/2022   HGBA1C 5.6 06/13/2022    CT CORONARY MORPH W/CTA COR W/SCORE W/CA W/CM &/OR WO/CM  Addendum Date: 10/11/2022   ADDENDUM REPORT: 10/11/2022 13:17 EXAM: OVER-READ INTERPRETATION  CT CHEST The following report is an over-read performed by radiologist Dr. Romona Curls of [facility] on 10/11/2022. This over-read does not include interpretation of cardiac or coronary anatomy or pathology. The coronary CTA interpretation by the cardiologist is attached. COMPARISON:  Chest  radiograph dated 09/19/2022 and CT chest dated 08/08/2022. FINDINGS: Cardiovascular: Vascular calcifications are seen in the thoracic aorta. Normal heart size. No pericardial effusion. Mediastinum/Nodes: No enlarged mediastinal lymph nodes. The visible trachea and esophagus demonstrate no significant findings. Lungs/Pleura: There is minimal bilateral dependent atelectasis. The previously described right apical pulmonary nodule is not included within the field of view. Upper Abdomen: No acute abnormality. Musculoskeletal: Degenerative changes are seen in the spine. IMPRESSION: No acute findings in the visualized extracardiac chest. Aortic Atherosclerosis (ICD10-I70.0). Electronically Signed   By: Romona Curls [name]   On: 10/11/2022 13:17   Result Date: 10/11/2022 CLINICAL DATA:  67 Year-old Male EXAM: Cardiac/Coronary  CTA TECHNIQUE: The patient was scanned on a [employer]. FINDINGS: Scan was triggered in the descending thoracic aorta. Axial non-contrast 3 mm slices were carried out through the heart. The data set was analyzed on a dedicated work station and scored using the Agatson method. Gantry rotation speed was 250 msecs and collimation was .6 mm. 0.8 mg of sl NTG was given. The 3D data set was reconstructed in 5% intervals of the 67-82 % of the R-R cycle. Diastolic phases were analyzed on a dedicated work station using MPR, MIP and VRT modes. The patient received 100 cc of contrast. 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 Right dominance. Coronary Calcium Score: Left main: 0 Left anterior descending artery: 0 Left circumflex artery: 50 Right coronary artery: 11 Total: 61 Percentile: 47th for age, sex, and race matched control. RCA is a large dominant artery that gives rise to PDA and PLA. Minimal soft plaque (1-24%) in the proximal RCA. Left main is a large artery that gives rise to LAD and  LCX arteries. There is no significant plaque. LAD is a large vessel that gives rise to two diagonal branches. There is no significant plaque. 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 Minimal soft and calcified plaque stenoses (1-24%) and a mild luminal narrowing (25-49%) in the body of the LCX without discrete plaque. 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 There is no slit like orifice. Other findings: Aorta: Normal size.  No calcifications.  No dissection. Main Pulmonary Artery: Normal size of the pulmonary artery. Systemic Veins: Normal drainage Aortic Valve:  Tri-leaflet.  No calcifications. Mitral valve: No calcifications. Normal pulmonary vein drainage into the left atrium. Normal left atrial appendage without a thrombus. Interatrial septum with no communications. Left Ventricle: Normal size Left Atrium: Normal size Right Ventricle: Dilated Right Atrium: Normal size Pericardium: Normal thickness Extra-cardiac findings: See attached radiology report for non-cardiac structures. IMPRESSION: 1. Coronary calcium score of 61. This was 47th percentile for age, sex, and race matched control. 2. 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 There is a retro-aortic course with only one high risk feature: Acute angle take off. 3. CAD-RADS 2. Mild non-obstructive CAD (25-49%). Consider non-atherosclerotic causes of chest pain. Consider preventive therapy and risk factor modification. RECOMMENDATIONS: RECOMMENDATIONS The proposed cut-off value of 1,651 AU yielded a 93 % sensitivity and 75 % specificity in grading AS severity in patients with classical low-flow, low-gradient AS. Proposed different cut-off values  to define severe AS for men and women as 2,065 AU and 1,274 AU, respectively. The joint European and American recommendations for the assessment of AS consider the aortic valve calcium score as a continuum - a very high calcium score suggests severe AS and a low calcium score  suggests severe AS is unlikely. Sunday Shams, et al. 2017 ESC/EACTS Guidelines for the management of valvular heart disease. Eur Heart J 2017;38:2739-91. Coronary artery calcium (CAC) score is a strong predictor of incident coronary heart disease (CHD) and provides predictive information beyond traditional risk factors. CAC scoring is reasonable to use in the decision to withhold, postpone, or initiate statin therapy in intermediate-risk or selected borderline-risk asymptomatic adults (age 61-75 years and LDL-C >=70 to <190 mg/dL) who do not have diabetes or established atherosclerotic cardiovascular disease (ASCVD).* In intermediate-risk (10-year ASCVD risk >=7.5% to <20%) adults or selected borderline-risk (10-year ASCVD risk >=5% to <7.5%) adults in whom a CAC score is measured for the purpose of making a treatment decision the following recommendations have been made: If CAC = 0, it is reasonable to withhold statin therapy and reassess in 5 to 10 years, as long as higher risk conditions are absent (diabetes mellitus, family history of premature CHD in first degree relatives (males <55 years; females <65 years), cigarette smoking, LDL >=190 mg/dL or other independent risk factors). If CAC is 1 to 99, it is reasonable to initiate statin therapy for patients >=73 years of age. If CAC is >=100 or >=75th percentile, it is reasonable to initiate statin therapy at any age. Cardiology referral should be considered for patients with CAC scores =400 or >=75th percentile. *2018 AHA/ACC/AACVPR/AAPA/ABC/ACPM/ADA/AGS/APhA/ASPC/NLA/PCNA Guideline on the Management of Blood Cholesterol: A Report of the American College of Cardiology/American Heart Association Task Force on Clinical Practice Guidelines. J Am Coll Cardiol. 2019;73(24):3168-3209. Riley Lam, MD Electronically Signed: By: Riley Lam [name] On: 09/29/2022 22:08       Assessment & Plan:  Routine general medical examination at  a health care facility  Health care maintenance  Hypercholesterolemia Assessment & Plan: Continue lipitor.  Low cholesterol diet and exercise as tolerated.  Improved.  Lab Results  Component Value Date   CHOL 142 06/13/2022   HDL 29.40 (L) 06/13/2022   LDLCALC 60 04/24/2013   LDLDIRECT 60.0 06/13/2022   TRIG 242.0 (H) 06/13/2022   CHOLHDL 5 06/13/2022    Orders: -     Hepatic function panel -     Lipid panel  Hyperglycemia Assessment & Plan: Low carb diet and exercise.  Follow met b and a1c.   Orders: -     Hemoglobin A1c  Essential hypertension, benign Assessment & Plan: Pressure as outlined.  Doing well.  Continue losartan and hctz.  Follow pressures.  Follow metabolic panel.  No changes.   Orders: -     Basic metabolic panel  Premature beats Assessment & Plan: Noticed on exam.  EKG - SR. No chest pain or sob reported.  No increased heart rate or palpitations.  Follow.  Orders: -     EKG 12-Lead  Aortic atherosclerosis (HCC) Assessment & Plan: Continue lipitor.    Anemia, unspecified type Assessment & Plan: Follow cbc. Hgb wnl 09/19/22.    Left-sided low back pain without sciatica, unspecified chronicity Assessment & Plan: Right low back pain resolved.  Now with left low back pain.  No radicular symptoms.  Tylenol prn. Discussed further w/up / treatment.  Wants to monitor.  Will notify me  if desires further w/up or evaluation.    Coronary artery disease involving native coronary artery of native heart without angina pectoris Assessment & Plan: Lexiscan Myoview study revealed a fixed inferior wall defect.  Schedule him for a coronary CTA for further evaluation. CTA as outlined.  F/u with Dr Elease Hashimoto as planned.    Gastroesophageal reflux disease, unspecified whether esophagitis present Assessment & Plan: No upper symptoms reported.  On protonix.    History of colonic polyps Assessment & Plan: Colonoscopy 11/2016 - rectal polyp - tubular adenoma.   Recommended f/u colonoscopy in 5 years. (Dr Norma Fredrickson).  Contact GI regarding f/u colonoscopy.    Peripheral vascular disease [facility]) Assessment & Plan: Saw Dr Wyn Quaker 02/04/22 - f/u PAD. Is status post bilateral iliac stent placement as well as right infrainguinal revascularization. Recommended to continue plavix and lipitor.  F/u one year.   Tobacco abuse Assessment & Plan: Has stopped smoking. Follow.    Lung nodule Assessment & Plan: He has seen Dr. Cliffton Asters and was scheduled for resection of his lung nodule.  He has a long history of cigarette smoking. Awaiting cardiac clearance.       Dale Passapatanzy, MD

## 2022-10-12 NOTE — Assessment & Plan Note (Signed)
Has stopped smoking.  Follow.

## 2022-10-12 NOTE — Assessment & Plan Note (Signed)
Noticed on exam.  EKG - SR. No chest pain or sob reported.  No increased heart rate or palpitations.  Follow.

## 2022-10-12 NOTE — Assessment & Plan Note (Signed)
Colonoscopy 11/2016 - rectal polyp - tubular adenoma.  Recommended f/u colonoscopy in 5 years. (Dr Norma Fredrickson).  Contact GI regarding f/u colonoscopy.

## 2022-10-12 NOTE — Assessment & Plan Note (Addendum)
Follow cbc. Hgb wnl 09/19/22.

## 2022-10-12 NOTE — Assessment & Plan Note (Signed)
No upper symptoms reported.  On protonix.

## 2022-10-12 NOTE — Assessment & Plan Note (Signed)
Continue lipitor.  Low cholesterol diet and exercise as tolerated.  Improved.  Lab Results  Component Value Date   CHOL 142 06/13/2022   HDL 29.40 (L) 06/13/2022   LDLCALC 60 04/24/2013   LDLDIRECT 60.0 06/13/2022   TRIG 242.0 (H) 06/13/2022   CHOLHDL 5 06/13/2022

## 2022-10-12 NOTE — Assessment & Plan Note (Signed)
Right low back pain resolved.  Now with left low back pain.  No radicular symptoms.  Tylenol prn. Discussed further w/up / treatment.  Wants to monitor.  Will notify me if desires further w/up or evaluation.

## 2022-10-12 NOTE — Assessment & Plan Note (Signed)
He has seen Dr. Cliffton Asters and was scheduled for resection of his lung nodule.  He has a long history of cigarette smoking. Awaiting cardiac clearance.

## 2022-10-12 NOTE — Assessment & Plan Note (Signed)
Lexiscan Myoview study revealed a fixed inferior wall defect.  Schedule him for a coronary CTA for further evaluation. CTA as outlined.  F/u with Dr Elease Hashimoto as planned.

## 2022-10-12 NOTE — Assessment & Plan Note (Signed)
Low carb diet and exercise.  Follow met b and a1c.

## 2022-10-12 NOTE — Assessment & Plan Note (Signed)
Pressure as outlined.  Doing well.  Continue losartan and hctz.  Follow pressures.  Follow metabolic panel.  No changes.  ?

## 2022-10-14 NOTE — Telephone Encounter (Signed)
Nahser, Deloris Ping, MD  Lars Mage, RN The coronary CTA did not show any significant coronary artery narrowings. He is at low risk for his procedure PN  Routing to pre-op pool.

## 2022-10-17 ENCOUNTER — Other Ambulatory Visit: Payer: Self-pay | Admitting: *Deleted

## 2022-10-17 ENCOUNTER — Encounter: Payer: Self-pay | Admitting: *Deleted

## 2022-10-17 DIAGNOSIS — R911 Solitary pulmonary nodule: Secondary | ICD-10-CM

## 2022-10-17 NOTE — Telephone Encounter (Signed)
Already routed to callback to assist as below. No additional action needed from APP team.

## 2022-10-17 NOTE — Telephone Encounter (Signed)
Will route to callback to assist. The preop team did not receive a separate clearance request for any procedure but it looks like Dr. Elease Hashimoto evaluated the patient for lung mass resection. Please find out who patient's surgeon is and route message to their office letting them know that Dr. Elease Hashimoto has cleared the patient to proceed without further cardiovascular testing and feels he is at low risk for the procedure. Thank you!  Will otherwise remove from preop APP box as separate/duplicate pre-op APP assessment not needed.

## 2022-10-17 NOTE — Telephone Encounter (Signed)
Placed call to pt, Dr. Cliffton Asters is the surgeon and he let me know that they have seen the clearance and have contacted him with a surgery date.

## 2022-10-19 DIAGNOSIS — I10 Essential (primary) hypertension: Secondary | ICD-10-CM | POA: Diagnosis not present

## 2022-10-19 DIAGNOSIS — J439 Emphysema, unspecified: Secondary | ICD-10-CM | POA: Diagnosis not present

## 2022-10-19 DIAGNOSIS — I739 Peripheral vascular disease, unspecified: Secondary | ICD-10-CM | POA: Diagnosis not present

## 2022-10-19 DIAGNOSIS — E785 Hyperlipidemia, unspecified: Secondary | ICD-10-CM | POA: Diagnosis not present

## 2022-10-19 DIAGNOSIS — G43909 Migraine, unspecified, not intractable, without status migrainosus: Secondary | ICD-10-CM | POA: Diagnosis not present

## 2022-10-19 DIAGNOSIS — K219 Gastro-esophageal reflux disease without esophagitis: Secondary | ICD-10-CM | POA: Diagnosis not present

## 2022-10-19 DIAGNOSIS — Z7902 Long term (current) use of antithrombotics/antiplatelets: Secondary | ICD-10-CM | POA: Diagnosis not present

## 2022-10-19 DIAGNOSIS — I700 Atherosclerosis of aorta: Secondary | ICD-10-CM | POA: Diagnosis not present

## 2022-10-19 DIAGNOSIS — Z85828 Personal history of other malignant neoplasm of skin: Secondary | ICD-10-CM | POA: Diagnosis not present

## 2022-10-19 DIAGNOSIS — I2510 Atherosclerotic heart disease of native coronary artery without angina pectoris: Secondary | ICD-10-CM | POA: Diagnosis not present

## 2022-10-19 DIAGNOSIS — Z87891 Personal history of nicotine dependence: Secondary | ICD-10-CM | POA: Diagnosis not present

## 2022-10-19 DIAGNOSIS — Z95820 Peripheral vascular angioplasty status with implants and grafts: Secondary | ICD-10-CM | POA: Diagnosis not present

## 2022-10-31 NOTE — Pre-Procedure Instructions (Signed)
Surgical Instructions   Your procedure is scheduled on November 03, 2022. Report to [facility] Main Entrance "A" at 10:00 A.M., then check in with the Admitting office. Any questions or running late day of surgery: call [phone]  Questions prior to your surgery date: call [phone], Monday-Friday, 8am-4pm. If you experience any cold or flu symptoms such as cough, fever, chills, shortness of breath, etc. between now and your scheduled surgery, please notify us at the above number.     Remember:  Do not eat or drink after midnight the night before your surgery   Take these medicines the morning of surgery with A SIP OF WATER: DULoxetine (CYMBALTA)  metoprolol succinate (TOPROL-XL)  pantoprazole (PROTONIX)    STOP taking your clopidogrel (PLAVIX) five days prior to surgery. Your last dose will be October 4th.   One week prior to surgery, STOP taking any Aspirin (unless otherwise instructed by your surgeon) Aleve, Naproxen, Ibuprofen, Motrin, Advil, Goody's, BC's, all herbal medications, fish oil, and non-prescription vitamins.                     Do NOT Smoke (Tobacco/Vaping) for 24 hours prior to your procedure.  If you use a CPAP at night, you may bring your mask/headgear for your overnight stay.   You will be asked to remove any contacts, glasses, piercing's, hearing aid's, dentures/partials prior to surgery. Please bring cases for these items if needed.    Patients discharged the day of surgery will not be allowed to drive home, and someone needs to stay with them for 24 hours.  SURGICAL WAITING ROOM VISITATION Patients may have no more than 2 support people in the waiting area - these visitors may rotate.   Pre-op nurse will coordinate an appropriate time for 1 ADULT support person, who may not rotate, to accompany patient in pre-op.  Children under the age of 57 must have an adult with them who is not the patient and must remain in the main waiting area with an  adult.  If the patient needs to stay at the hospital during part of their recovery, the visitor guidelines for inpatient rooms apply.  Please refer to the [facility] website for the visitor guidelines for any additional information.   If you received a COVID test during your pre-op visit  it is requested that you wear a mask when out in public, stay away from anyone that may not be feeling well and notify your surgeon if you develop symptoms. If you have been in contact with anyone that has tested positive in the last 10 days please notify you surgeon.      Pre-operative CHG Bathing Instructions   You can play a key role in reducing the risk of infection after surgery. Your skin needs to be as free of germs as possible. You can reduce the number of germs on your skin by washing with CHG (chlorhexidine gluconate) soap before surgery. CHG is an antiseptic soap that kills germs and continues to kill germs even after washing.   DO NOT use if you have an allergy to chlorhexidine/CHG or antibacterial soaps. If your skin becomes reddened or irritated, stop using the CHG and notify one of our RNs at [phone].              TAKE A SHOWER THE NIGHT BEFORE SURGERY AND THE DAY OF SURGERY    Please keep in mind the following:  DO NOT shave, including legs and underarms, 48 hours  prior to surgery.   You may shave your face before/day of surgery.  Place clean sheets on your bed the night before surgery Use a clean washcloth (not used since being washed) for each shower. DO NOT sleep with pet's night before surgery.  CHG Shower Instructions:  Wash your face and private area with normal soap. If you choose to wash your hair, wash first with your normal shampoo.  After you use shampoo/soap, rinse your hair and body thoroughly to remove shampoo/soap residue.  Turn the water OFF and apply half the bottle of CHG soap to a CLEAN washcloth.  Apply CHG soap ONLY FROM YOUR NECK DOWN TO YOUR TOES (washing  for 3-5 minutes)  DO NOT use CHG soap on face, private areas, open wounds, or sores.  Pay special attention to the area where your surgery is being performed.  If you are having back surgery, having someone wash your back for you may be helpful. Wait 2 minutes after CHG soap is applied, then you may rinse off the CHG soap.  Pat dry with a clean towel  Put on clean pajamas    Additional instructions for the day of surgery: DO NOT APPLY any lotions, deodorants, cologne, or perfumes.   Do not wear jewelry or makeup Do not wear nail polish, gel polish, artificial nails, or any other type of covering on natural nails (fingers and toes) Do not bring valuables to the hospital. [facility] is not responsible for valuables/personal belongings. Put on clean/comfortable clothes.  Please brush your teeth.  Ask your nurse before applying any prescription medications to the skin.

## 2022-11-01 ENCOUNTER — Other Ambulatory Visit: Payer: Self-pay

## 2022-11-01 ENCOUNTER — Encounter (HOSPITAL_COMMUNITY): Payer: Self-pay

## 2022-11-01 ENCOUNTER — Ambulatory Visit (HOSPITAL_COMMUNITY)
Admission: RE | Admit: 2022-11-01 | Discharge: 2022-11-01 | Disposition: A | Discharge status: Home / Self Care | Payer: Medicare HMO | Source: Ambulatory Visit | Attending: Thoracic Surgery (Cardiothoracic Vascular Surgery) | Admitting: Thoracic Surgery (Cardiothoracic Vascular Surgery)

## 2022-11-01 ENCOUNTER — Encounter (HOSPITAL_COMMUNITY)
Admission: RE | Admit: 2022-11-01 | Discharge: 2022-11-01 | Disposition: A | Discharge status: Home / Self Care | Payer: Medicare HMO | Source: Ambulatory Visit | Attending: Thoracic Surgery (Cardiothoracic Vascular Surgery) | Admitting: Thoracic Surgery (Cardiothoracic Vascular Surgery)

## 2022-11-01 VITALS — BP 153/90 | HR 52 | Temp 97.8°F | Resp 17 | Ht 69.0 in | Wt 169.8 lb

## 2022-11-01 DIAGNOSIS — K219 Gastro-esophageal reflux disease without esophagitis: Secondary | ICD-10-CM | POA: Insufficient documentation

## 2022-11-01 DIAGNOSIS — I2510 Atherosclerotic heart disease of native coronary artery without angina pectoris: Secondary | ICD-10-CM | POA: Insufficient documentation

## 2022-11-01 DIAGNOSIS — R918 Other nonspecific abnormal finding of lung field: Secondary | ICD-10-CM | POA: Diagnosis not present

## 2022-11-01 DIAGNOSIS — Z01818 Encounter for other preprocedural examination: Secondary | ICD-10-CM | POA: Insufficient documentation

## 2022-11-01 DIAGNOSIS — I10 Essential (primary) hypertension: Secondary | ICD-10-CM | POA: Insufficient documentation

## 2022-11-01 DIAGNOSIS — R079 Chest pain, unspecified: Secondary | ICD-10-CM | POA: Insufficient documentation

## 2022-11-01 DIAGNOSIS — Z87891 Personal history of nicotine dependence: Secondary | ICD-10-CM | POA: Insufficient documentation

## 2022-11-01 DIAGNOSIS — Q245 Malformation of coronary vessels: Secondary | ICD-10-CM | POA: Insufficient documentation

## 2022-11-01 DIAGNOSIS — G4733 Obstructive sleep apnea (adult) (pediatric): Secondary | ICD-10-CM | POA: Insufficient documentation

## 2022-11-01 DIAGNOSIS — R911 Solitary pulmonary nodule: Secondary | ICD-10-CM

## 2022-11-01 DIAGNOSIS — I739 Peripheral vascular disease, unspecified: Secondary | ICD-10-CM | POA: Insufficient documentation

## 2022-11-01 DIAGNOSIS — Z1152 Encounter for screening for COVID-19: Secondary | ICD-10-CM | POA: Insufficient documentation

## 2022-11-01 HISTORY — DX: Acute myocardial infarction, unspecified: I21.9

## 2022-11-01 LAB — COMPREHENSIVE METABOLIC PANEL
ALT: 15 U/L (ref 0–44)
AST: 24 U/L (ref 15–41)
Albumin: 4.3 g/dL (ref 3.5–5.0)
Alkaline Phosphatase: 68 U/L (ref 38–126)
Anion gap: 9 (ref 5–15)
BUN: 16 mg/dL (ref 8–23)
CO2: 29 mmol/L (ref 22–32)
Calcium: 9.4 mg/dL (ref 8.9–10.3)
Chloride: 100 mmol/L (ref 98–111)
Creatinine, Ser: 1.05 mg/dL (ref 0.61–1.24)
GFR, Estimated: >60 mL/min (ref >60)
Glucose, Bld: 95 mg/dL (ref 70–99)
Potassium: 3.8 mmol/L (ref 3.5–5.1)
Sodium: 138 mmol/L (ref 135–145)
Total Bilirubin: 1.1 mg/dL (ref 0.3–1.2)
Total Protein: 7.3 g/dL (ref 6.5–8.1)

## 2022-11-01 LAB — PROTIME-INR
INR: 1 (ref 0.8–1.2)
Prothrombin Time: 13.3 s (ref 11.4–15.2)

## 2022-11-01 LAB — CBC
HCT: 42.4 % (ref 39.0–52.0)
Hemoglobin: 14.4 g/dL (ref 13.0–17.0)
MCH: 31.9 pg (ref 26.0–34.0)
MCHC: 34 g/dL (ref 30.0–36.0)
MCV: 94 fL (ref 80.0–100.0)
Platelets: 228 10*3/uL (ref 150–400)
RBC: 4.51 MIL/uL (ref 4.22–5.81)
RDW: 13.2 % (ref 11.5–15.5)
WBC: 5.5 10*3/uL (ref 4.0–10.5)
nRBC: 0 % (ref 0.0–0.2)

## 2022-11-01 LAB — URINALYSIS, ROUTINE W REFLEX MICROSCOPIC
Bilirubin Urine: NEGATIVE
Glucose, UA: NEGATIVE mg/dL
Hgb urine dipstick: NEGATIVE
Ketones, ur: NEGATIVE mg/dL
Leukocytes,Ua: NEGATIVE
Nitrite: NEGATIVE
Protein, ur: NEGATIVE mg/dL
Specific Gravity, Urine: 1.003 — ABNORMAL LOW (ref 1.005–1.030)
pH: 7 (ref 5.0–8.0)

## 2022-11-01 LAB — TYPE AND SCREEN
ABO/RH(D): A POS
Antibody Screen: NEGATIVE

## 2022-11-01 LAB — SURGICAL PCR SCREEN
MRSA, PCR: NEGATIVE
Staphylococcus aureus: POSITIVE — AB

## 2022-11-01 LAB — APTT: aPTT: 27 s (ref 24–36)

## 2022-11-01 NOTE — Progress Notes (Signed)
PCP - Dr. Dale Truxton Cardiologist - Dr. Kristeen Miss - Recently established for cardiac clearance  PPM/ICD - Denies Device Orders - n/a Rep Notified - n/a  Chest x-ray - 11/01/2022 EKG - 10/12/2022 Stress Test - 09/07/2022 ECHO - Denies Cardiac Cath - Denies  Sleep Study - +OSA. Pt wears CPAP nightly. He does not know his pressure settings  No DM  Last dose of GLP1 agonist- n/a GLP1 instructions: n/a  Blood Thinner Instructions: Pt instructed to hold Plavix for 5 days. Last dose was 10/4 Aspirin Instructions: n/a  NPO after midnight  COVID TEST- Yes. Result pending.   Anesthesia review: Yes. Cardiac Clearance. Pt was supposed to have surgery in August, but a stress test needed to be completed prior to surgery.   Patient denies shortness of breath, fever, cough and chest pain at PAT appointment. Pt denies any respiratory illness/infection in the last two months.   All instructions explained to the patient, with a verbal understanding of the material. Patient agrees to go over the instructions while at home for a better understanding. Patient also instructed to self quarantine after being tested for COVID-19. The opportunity to ask questions was provided.

## 2022-11-02 LAB — SARS CORONAVIRUS 2 (TAT 6-24 HRS): SARS Coronavirus 2: NEGATIVE

## 2022-11-02 NOTE — Anesthesia Preprocedure Evaluation (Addendum)
Anesthesia Evaluation  Patient identified by MRN, date of birth, ID band Patient awake    Reviewed: Allergy & Precautions, H&P , NPO status , Patient's Chart, lab work & pertinent test results  Airway Mallampati: III  TM Distance: >3 FB Neck ROM: Full    Dental no notable dental hx. (+) Teeth Intact, Dental Advisory Given   Pulmonary sleep apnea , Patient abstained from smoking., former smoker   Pulmonary exam normal breath sounds clear to auscultation       Cardiovascular hypertension, Pt. on medications and Pt. on home beta blockers + CAD, + Past MI and + Peripheral Vascular Disease   Rhythm:Regular Rate:Normal     Neuro/Psych  Headaches  negative psych ROS   GI/Hepatic Neg liver ROS,GERD  Medicated,,  Endo/Other  negative endocrine ROS    Renal/GU negative Renal ROS  negative genitourinary   Musculoskeletal  (+) Arthritis , Osteoarthritis,    Abdominal   Peds  Hematology  (+) Blood dyscrasia, anemia   Anesthesia Other Findings   Reproductive/Obstetrics negative OB ROS                             Anesthesia Physical Anesthesia Plan  ASA: 3  Anesthesia Plan: General   Post-op Pain Management: Tylenol PO (pre-op)*   Induction: Intravenous  PONV Risk Score and Plan: 3 and Ondansetron, Dexamethasone and Midazolam  Airway Management Planned: Double Lumen EBT  Additional Equipment: ClearSight  Intra-op Plan:   Post-operative Plan: Extubation in OR  Informed Consent: I have reviewed the patients History and Physical, chart, labs and discussed the procedure including the risks, benefits and alternatives for the proposed anesthesia with the patient or authorized representative who has indicated his/her understanding and acceptance.     Dental advisory given  Plan Discussed with: CRNA  Anesthesia Plan Comments: (PAT note by Antionette Poles, PA-C: 67 year old male with pertinent  history including HTN, PAD s/p bilateral iliac stenting as well as right infrainguinal revascularization, HLD, OSA on CPAP, GERD, former smoker (quit 09/02/2022).  Patient had preop cardiology evaluation by Dr. Elease Hashimoto.  Per note 09/23/2022, "presents for further evaluation prior to resection of the lung mass.  Lexiscan Myoview study revealed a fixed inferior wall defect.  He has no symptoms.  He does not recall having any severe episodes of chest pain. At this point we need to rule out significant CAD involving his LAD and left circumflex artery.  Will schedule him for a coronary CTA for further evaluation.  If this looks good then we will be able to clear him for his surgery.  If he has severe calcifications or questionable disease on his anterior lateral wall then we may need to perform a heart catheterization."  Coronary CTA 09/29/2022 showed mild nonobstructive CAD.  Recently quit former smoker with right upper lobe pulmonary nodule found on screening CT in April 2024.  Follow-up CT showed 1.9 cm right upper lobe pulmonary nodule with interval growth.  PFTs 08/25/2022 showed marginal lung function, FVC 77%, FEV1 55%, DLCO 55%.  Patient reports last dose Plavix 10/28/2022.  Preop labs reviewed, WNL.  EKG 10/12/2022: Sinus bradycardia.  Rate 54.  Coronary CTA 09/29/2022: IMPRESSION: 1. Coronary calcium score of 61. This was 47th percentile for age, sex, and race matched control.  2. 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 There is a retro-aortic course with only one high risk feature: Acute angle take off.  3. CAD-RADS  2. Mild non-obstructive CAD (25-49%). Consider non-atherosclerotic causes of chest pain. Consider preventive therapy and risk factor modification.  Nuclear stress 09/07/2022:    Findings are consistent with infarction. The study is intermediate risk due to mildly depressed ejection fraction.   LV perfusion is abnormal. Defect 1: There is a  small defect with moderate reduction in uptake present in the basal inferior location(s) that is fixed. There is abnormal wall motion in the defect area. Consistent with infarction.   Left ventricular function is abnormal. Global function is mildly reduced. Nuclear stress EF: 54%. The left ventricular ejection fraction is mildly decreased (45-54%). End diastolic cavity size is normal. End systolic cavity size is normal.   )        Anesthesia Quick Evaluation

## 2022-11-02 NOTE — Progress Notes (Addendum)
Anesthesia Chart Review:  67 year old male with pertinent history including HTN, PAD s/p bilateral iliac stenting as well as right infrainguinal revascularization, HLD, OSA on CPAP, GERD, former smoker (quit 09/02/2022).  Patient had preop cardiology evaluation by Dr. Elease Hashimoto.  Per note 09/23/2022, "presents for further evaluation prior to resection of the lung mass.  Lexiscan Myoview study revealed a fixed inferior wall defect.  He has no symptoms.  He does not recall having any severe episodes of chest pain. At this point we need to rule out significant CAD involving his LAD and left circumflex artery.  Will schedule him for a coronary CTA for further evaluation.  If this looks good then we will be able to clear him for his surgery.  If he has severe calcifications or questionable disease on his anterior lateral wall then we may need to perform a heart catheterization."  Coronary CTA 09/29/2022 showed mild nonobstructive CAD.  Recently quit former smoker with right upper lobe pulmonary nodule found on screening CT in April 2024.  Follow-up CT showed 1.9 cm right upper lobe pulmonary nodule with interval growth.  PFTs 08/25/2022 showed marginal lung function, FVC 77%, FEV1 55%, DLCO 55%.  Patient reports last dose Plavix 10/28/2022.  Preop labs reviewed, WNL.  EKG 10/12/2022: Sinus bradycardia.  Rate 54.  Coronary CTA 09/29/2022: IMPRESSION: 1. Coronary calcium score of 61. This was 47th percentile for age, sex, and race matched control.   2. 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 There is a retro-aortic course with only one high risk feature: Acute angle take off.   3. CAD-RADS 2. Mild non-obstructive CAD (25-49%). Consider non-atherosclerotic causes of chest pain. Consider preventive therapy and risk factor modification.  Nuclear stress 09/07/2022:    Findings are consistent with infarction. The study is intermediate risk due to mildly depressed ejection  fraction.   LV perfusion is abnormal. Defect 1: There is a small defect with moderate reduction in uptake present in the basal inferior location(s) that is fixed. There is abnormal wall motion in the defect area. Consistent with infarction.   Left ventricular function is abnormal. Global function is mildly reduced. Nuclear stress EF: 54%. The left ventricular ejection fraction is mildly decreased (45-54%). End diastolic cavity size is normal. End systolic cavity size is normal.    Travis Gray [facility] Short Stay Center/Anesthesiology Phone [phone] 11/02/2022 9:11 AM

## 2022-11-03 ENCOUNTER — Inpatient Hospital Stay (HOSPITAL_COMMUNITY): Payer: Medicare HMO

## 2022-11-03 ENCOUNTER — Inpatient Hospital Stay (HOSPITAL_COMMUNITY): Payer: Medicare HMO | Admitting: Physician Assistant

## 2022-11-03 ENCOUNTER — Encounter (HOSPITAL_COMMUNITY)
Admission: RE | Disposition: A | Discharge status: Home / Self Care | Payer: Self-pay | Source: Home / Self Care | Attending: Thoracic Surgery (Cardiothoracic Vascular Surgery)

## 2022-11-03 ENCOUNTER — Other Ambulatory Visit: Payer: Self-pay

## 2022-11-03 ENCOUNTER — Inpatient Hospital Stay (HOSPITAL_COMMUNITY): Payer: Medicare HMO | Admitting: Certified Registered"

## 2022-11-03 ENCOUNTER — Inpatient Hospital Stay (HOSPITAL_COMMUNITY)
Admission: RE | Admit: 2022-11-03 | Discharge: 2022-11-07 | DRG: 165 | Disposition: A | Discharge status: Home / Self Care | Payer: Medicare HMO | Attending: Thoracic Surgery (Cardiothoracic Vascular Surgery) | Admitting: Thoracic Surgery (Cardiothoracic Vascular Surgery)

## 2022-11-03 DIAGNOSIS — I252 Old myocardial infarction: Secondary | ICD-10-CM | POA: Diagnosis not present

## 2022-11-03 DIAGNOSIS — Z803 Family history of malignant neoplasm of breast: Secondary | ICD-10-CM

## 2022-11-03 DIAGNOSIS — J984 Other disorders of lung: Secondary | ICD-10-CM | POA: Diagnosis not present

## 2022-11-03 DIAGNOSIS — I2510 Atherosclerotic heart disease of native coronary artery without angina pectoris: Secondary | ICD-10-CM | POA: Diagnosis not present

## 2022-11-03 DIAGNOSIS — Z1152 Encounter for screening for COVID-19: Secondary | ICD-10-CM

## 2022-11-03 DIAGNOSIS — Z9889 Other specified postprocedural states: Secondary | ICD-10-CM | POA: Diagnosis not present

## 2022-11-03 DIAGNOSIS — I10 Essential (primary) hypertension: Secondary | ICD-10-CM | POA: Diagnosis present

## 2022-11-03 DIAGNOSIS — J939 Pneumothorax, unspecified: Secondary | ICD-10-CM | POA: Diagnosis not present

## 2022-11-03 DIAGNOSIS — Z8049 Family history of malignant neoplasm of other genital organs: Secondary | ICD-10-CM

## 2022-11-03 DIAGNOSIS — J850 Gangrene and necrosis of lung: Secondary | ICD-10-CM | POA: Diagnosis not present

## 2022-11-03 DIAGNOSIS — I739 Peripheral vascular disease, unspecified: Secondary | ICD-10-CM | POA: Diagnosis present

## 2022-11-03 DIAGNOSIS — J8410 Pulmonary fibrosis, unspecified: Secondary | ICD-10-CM | POA: Diagnosis not present

## 2022-11-03 DIAGNOSIS — Z79899 Other long term (current) drug therapy: Secondary | ICD-10-CM | POA: Diagnosis not present

## 2022-11-03 DIAGNOSIS — K219 Gastro-esophageal reflux disease without esophagitis: Secondary | ICD-10-CM | POA: Diagnosis not present

## 2022-11-03 DIAGNOSIS — R911 Solitary pulmonary nodule: Secondary | ICD-10-CM | POA: Diagnosis not present

## 2022-11-03 DIAGNOSIS — Z7902 Long term (current) use of antithrombotics/antiplatelets: Secondary | ICD-10-CM

## 2022-11-03 DIAGNOSIS — Z83438 Family history of other disorder of lipoprotein metabolism and other lipidemia: Secondary | ICD-10-CM

## 2022-11-03 DIAGNOSIS — J9382 Other air leak: Secondary | ICD-10-CM | POA: Diagnosis not present

## 2022-11-03 DIAGNOSIS — Z85828 Personal history of other malignant neoplasm of skin: Secondary | ICD-10-CM | POA: Diagnosis not present

## 2022-11-03 DIAGNOSIS — F17210 Nicotine dependence, cigarettes, uncomplicated: Secondary | ICD-10-CM | POA: Diagnosis not present

## 2022-11-03 DIAGNOSIS — Z833 Family history of diabetes mellitus: Secondary | ICD-10-CM | POA: Diagnosis not present

## 2022-11-03 DIAGNOSIS — R918 Other nonspecific abnormal finding of lung field: Secondary | ICD-10-CM | POA: Diagnosis not present

## 2022-11-03 DIAGNOSIS — Z8052 Family history of malignant neoplasm of bladder: Secondary | ICD-10-CM

## 2022-11-03 DIAGNOSIS — J439 Emphysema, unspecified: Secondary | ICD-10-CM | POA: Diagnosis not present

## 2022-11-03 DIAGNOSIS — J189 Pneumonia, unspecified organism: Secondary | ICD-10-CM | POA: Diagnosis not present

## 2022-11-03 DIAGNOSIS — K5900 Constipation, unspecified: Secondary | ICD-10-CM | POA: Diagnosis not present

## 2022-11-03 DIAGNOSIS — Z4682 Encounter for fitting and adjustment of non-vascular catheter: Secondary | ICD-10-CM | POA: Diagnosis not present

## 2022-11-03 DIAGNOSIS — Z8249 Family history of ischemic heart disease and other diseases of the circulatory system: Secondary | ICD-10-CM | POA: Diagnosis not present

## 2022-11-03 DIAGNOSIS — Z807 Family history of other malignant neoplasms of lymphoid, hematopoietic and related tissues: Secondary | ICD-10-CM

## 2022-11-03 DIAGNOSIS — E7800 Pure hypercholesterolemia, unspecified: Secondary | ICD-10-CM | POA: Diagnosis present

## 2022-11-03 DIAGNOSIS — R001 Bradycardia, unspecified: Secondary | ICD-10-CM | POA: Diagnosis present

## 2022-11-03 DIAGNOSIS — T797XXA Traumatic subcutaneous emphysema, initial encounter: Secondary | ICD-10-CM | POA: Diagnosis not present

## 2022-11-03 DIAGNOSIS — Z48813 Encounter for surgical aftercare following surgery on the respiratory system: Secondary | ICD-10-CM | POA: Diagnosis not present

## 2022-11-03 HISTORY — PX: LYMPH NODE BIOPSY: SHX201

## 2022-11-03 HISTORY — PX: INTERCOSTAL NERVE BLOCK: SHX5021

## 2022-11-03 SURGERY — LOBECTOMY, LUNG, ROBOT-ASSISTED, USING VATS
Anesthesia: General | Site: Chest | Laterality: Right

## 2022-11-03 MED ORDER — LACTATED RINGERS IV SOLN: INTRAVENOUS | Status: DC

## 2022-11-03 MED ORDER — PROPOFOL 10 MG/ML IV BOLUS
INTRAVENOUS | Status: AC
  Filled 2022-11-03: qty 20

## 2022-11-03 MED ORDER — ACETAMINOPHEN 500 MG PO TABS: 1000.0000 mg | ORAL_TABLET | Freq: Once | ORAL | Status: AC

## 2022-11-03 MED ORDER — METOPROLOL TARTRATE 12.5 MG HALF TABLET
12.5000 mg | ORAL_TABLET | Freq: Two times a day (BID) | ORAL | Status: DC
  Administered 2022-11-03: 12.5 mg via ORAL
  Filled 2022-11-03: qty 1

## 2022-11-03 MED ORDER — SUGAMMADEX SODIUM 200 MG/2ML IV SOLN
INTRAVENOUS | Status: DC | PRN
  Administered 2022-11-03: 154.2 mg via INTRAVENOUS

## 2022-11-03 MED ORDER — ROCURONIUM BROMIDE 10 MG/ML (PF) SYRINGE
PREFILLED_SYRINGE | INTRAVENOUS | Status: AC
  Filled 2022-11-03: qty 20

## 2022-11-03 MED ORDER — 0.9 % SODIUM CHLORIDE (POUR BTL) OPTIME
TOPICAL | Status: DC | PRN
  Administered 2022-11-03: 2000 mL

## 2022-11-03 MED ORDER — CHLORHEXIDINE GLUCONATE 0.12 % MT SOLN: 15.0000 mL | Freq: Once | OROMUCOSAL | Status: AC

## 2022-11-03 MED ORDER — FENTANYL CITRATE (PF) 250 MCG/5ML IJ SOLN
INTRAMUSCULAR | Status: DC | PRN
  Administered 2022-11-03 (×5): 50 ug via INTRAVENOUS
  Administered 2022-11-03: 100 ug via INTRAVENOUS

## 2022-11-03 MED ORDER — BISACODYL 5 MG PO TBEC
10.0000 mg | DELAYED_RELEASE_TABLET | Freq: Every day | ORAL | Status: DC
  Administered 2022-11-03 – 2022-11-06 (×4): 10 mg via ORAL
  Filled 2022-11-03 (×5): qty 2

## 2022-11-03 MED ORDER — LIDOCAINE 2% (20 MG/ML) 5 ML SYRINGE
INTRAMUSCULAR | Status: DC | PRN
  Administered 2022-11-03: 60 mg via INTRAVENOUS

## 2022-11-03 MED ORDER — ONDANSETRON HCL 4 MG/2ML IJ SOLN
INTRAMUSCULAR | Status: DC | PRN
  Administered 2022-11-03: 4 mg via INTRAVENOUS

## 2022-11-03 MED ORDER — BUPIVACAINE LIPOSOME 1.3 % IJ SUSP
INTRAMUSCULAR | Status: AC
  Filled 2022-11-03: qty 20

## 2022-11-03 MED ORDER — DEXAMETHASONE SODIUM PHOSPHATE 10 MG/ML IJ SOLN
INTRAMUSCULAR | Status: DC | PRN
  Administered 2022-11-03: 10 mg via INTRAVENOUS

## 2022-11-03 MED ORDER — ACETAMINOPHEN 160 MG/5ML PO SOLN: 1000.0000 mg | Freq: Four times a day (QID) | ORAL | Status: DC

## 2022-11-03 MED ORDER — PHENYLEPHRINE 80 MCG/ML (10ML) SYRINGE FOR IV PUSH (FOR BLOOD PRESSURE SUPPORT)
PREFILLED_SYRINGE | INTRAVENOUS | Status: AC
  Filled 2022-11-03: qty 10

## 2022-11-03 MED ORDER — CHLORHEXIDINE GLUCONATE 0.12 % MT SOLN
OROMUCOSAL | Status: AC
  Administered 2022-11-03: 15 mL via OROMUCOSAL
  Filled 2022-11-03: qty 15

## 2022-11-03 MED ORDER — LOSARTAN POTASSIUM 50 MG PO TABS: 100.0000 mg | ORAL_TABLET | Freq: Every day | ORAL | Status: DC

## 2022-11-03 MED ORDER — BUPIVACAINE HCL (PF) 0.5 % IJ SOLN
INTRAMUSCULAR | Status: AC
  Filled 2022-11-03: qty 30

## 2022-11-03 MED ORDER — PHENYLEPHRINE 80 MCG/ML (10ML) SYRINGE FOR IV PUSH (FOR BLOOD PRESSURE SUPPORT)
PREFILLED_SYRINGE | INTRAVENOUS | Status: DC | PRN
  Administered 2022-11-03 (×3): 80 ug via INTRAVENOUS

## 2022-11-03 MED ORDER — ROCURONIUM BROMIDE 10 MG/ML (PF) SYRINGE
PREFILLED_SYRINGE | INTRAVENOUS | Status: DC | PRN
  Administered 2022-11-03: 80 mg via INTRAVENOUS
  Administered 2022-11-03: 20 mg via INTRAVENOUS

## 2022-11-03 MED ORDER — FENTANYL CITRATE (PF) 250 MCG/5ML IJ SOLN
INTRAMUSCULAR | Status: AC
  Filled 2022-11-03: qty 5

## 2022-11-03 MED ORDER — TRAMADOL HCL 50 MG PO TABS
50.0000 mg | ORAL_TABLET | Freq: Four times a day (QID) | ORAL | Status: DC | PRN
  Administered 2022-11-04: 100 mg via ORAL
  Filled 2022-11-03: qty 2

## 2022-11-03 MED ORDER — METOPROLOL SUCCINATE ER 25 MG PO TB24
25.0000 mg | ORAL_TABLET | Freq: Every day | ORAL | Status: DC
  Administered 2022-11-05 – 2022-11-07 (×3): 25 mg via ORAL
  Filled 2022-11-03 (×4): qty 1

## 2022-11-03 MED ORDER — MIDAZOLAM HCL 2 MG/2ML IJ SOLN
INTRAMUSCULAR | Status: AC
  Filled 2022-11-03: qty 2

## 2022-11-03 MED ORDER — SENNOSIDES-DOCUSATE SODIUM 8.6-50 MG PO TABS
1.0000 | ORAL_TABLET | Freq: Every day | ORAL | Status: DC
  Administered 2022-11-04 – 2022-11-05 (×2): 1 via ORAL
  Filled 2022-11-03 (×2): qty 1

## 2022-11-03 MED ORDER — AMIODARONE HCL 200 MG PO TABS
400.0000 mg | ORAL_TABLET | Freq: Two times a day (BID) | ORAL | Status: DC
  Administered 2022-11-03 – 2022-11-04 (×2): 400 mg via ORAL
  Filled 2022-11-03 (×2): qty 2

## 2022-11-03 MED ORDER — LIDOCAINE 2% (20 MG/ML) 5 ML SYRINGE
INTRAMUSCULAR | Status: AC
  Filled 2022-11-03: qty 5

## 2022-11-03 MED ORDER — ACETAMINOPHEN 500 MG PO TABS
ORAL_TABLET | ORAL | Status: AC
  Administered 2022-11-03: 1000 mg via ORAL
  Filled 2022-11-03: qty 2

## 2022-11-03 MED ORDER — DEXAMETHASONE SODIUM PHOSPHATE 10 MG/ML IJ SOLN
INTRAMUSCULAR | Status: AC
  Filled 2022-11-03: qty 1

## 2022-11-03 MED ORDER — PHENYLEPHRINE HCL-NACL 20-0.9 MG/250ML-% IV SOLN
INTRAVENOUS | Status: DC | PRN
  Administered 2022-11-03: 20 ug/min via INTRAVENOUS

## 2022-11-03 MED ORDER — SODIUM CHLORIDE FLUSH 0.9 % IV SOLN
INTRAVENOUS | Status: DC | PRN
  Administered 2022-11-03: 100 mL

## 2022-11-03 MED ORDER — CEFAZOLIN SODIUM-DEXTROSE 2-4 GM/100ML-% IV SOLN
2.0000 g | INTRAVENOUS | Status: AC
  Administered 2022-11-03: 2 g via INTRAVENOUS
  Filled 2022-11-03: qty 100

## 2022-11-03 MED ORDER — HYDROMORPHONE HCL 1 MG/ML IJ SOLN: 0.2500 mg | INTRAMUSCULAR | Status: DC | PRN

## 2022-11-03 MED ORDER — PROPOFOL 10 MG/ML IV BOLUS
INTRAVENOUS | Status: DC | PRN
  Administered 2022-11-03: 140 mg via INTRAVENOUS

## 2022-11-03 MED ORDER — HYDRALAZINE HCL 20 MG/ML IJ SOLN: 10.0000 mg | Freq: Four times a day (QID) | INTRAMUSCULAR | Status: DC | PRN

## 2022-11-03 MED ORDER — MORPHINE SULFATE (PF) 2 MG/ML IV SOLN
2.0000 mg | INTRAVENOUS | Status: DC | PRN
  Administered 2022-11-04: 2 mg via INTRAVENOUS
  Filled 2022-11-03: qty 1

## 2022-11-03 MED ORDER — ENOXAPARIN SODIUM 40 MG/0.4ML IJ SOSY
40.0000 mg | PREFILLED_SYRINGE | Freq: Every day | INTRAMUSCULAR | Status: DC
  Administered 2022-11-04 – 2022-11-07 (×4): 40 mg via SUBCUTANEOUS
  Filled 2022-11-03 (×4): qty 0.4

## 2022-11-03 MED ORDER — OXYCODONE HCL 5 MG PO TABS
5.0000 mg | ORAL_TABLET | ORAL | Status: DC | PRN
  Administered 2022-11-04 – 2022-11-05 (×5): 10 mg via ORAL
  Administered 2022-11-06: 5 mg via ORAL
  Administered 2022-11-06 (×2): 10 mg via ORAL
  Administered 2022-11-07: 5 mg via ORAL
  Filled 2022-11-03: qty 1
  Filled 2022-11-03 (×3): qty 2
  Filled 2022-11-03: qty 1
  Filled 2022-11-03 (×4): qty 2

## 2022-11-03 MED ORDER — ACETAMINOPHEN 500 MG PO TABS
1000.0000 mg | ORAL_TABLET | Freq: Four times a day (QID) | ORAL | Status: DC
  Administered 2022-11-03 – 2022-11-07 (×15): 1000 mg via ORAL
  Filled 2022-11-03 (×15): qty 2

## 2022-11-03 MED ORDER — DULOXETINE HCL 30 MG PO CPEP
30.0000 mg | ORAL_CAPSULE | Freq: Every day | ORAL | Status: DC
  Administered 2022-11-04 – 2022-11-07 (×4): 30 mg via ORAL
  Filled 2022-11-03 (×4): qty 1

## 2022-11-03 MED ORDER — MIDAZOLAM HCL 2 MG/2ML IJ SOLN
INTRAMUSCULAR | Status: DC | PRN
  Administered 2022-11-03: 2 mg via INTRAVENOUS

## 2022-11-03 MED ORDER — ONDANSETRON HCL 4 MG/2ML IJ SOLN: 4.0000 mg | Freq: Four times a day (QID) | INTRAMUSCULAR | Status: DC | PRN

## 2022-11-03 MED ORDER — ORAL CARE MOUTH RINSE: 15.0000 mL | Freq: Once | OROMUCOSAL | Status: AC

## 2022-11-03 MED ORDER — AMITRIPTYLINE HCL 25 MG PO TABS
25.0000 mg | ORAL_TABLET | Freq: Every day | ORAL | Status: DC
  Administered 2022-11-04 – 2022-11-06 (×3): 25 mg via ORAL
  Filled 2022-11-03 (×4): qty 1

## 2022-11-03 MED ORDER — ATORVASTATIN CALCIUM 10 MG PO TABS
20.0000 mg | ORAL_TABLET | Freq: Every day | ORAL | Status: DC
  Administered 2022-11-04 – 2022-11-06 (×3): 20 mg via ORAL
  Filled 2022-11-03 (×3): qty 2

## 2022-11-03 MED ORDER — CEFAZOLIN SODIUM-DEXTROSE 2-4 GM/100ML-% IV SOLN
2.0000 g | Freq: Three times a day (TID) | INTRAVENOUS | Status: AC
  Administered 2022-11-03 – 2022-11-04 (×2): 2 g via INTRAVENOUS
  Filled 2022-11-03 (×2): qty 100

## 2022-11-03 MED ORDER — ONDANSETRON HCL 4 MG/2ML IJ SOLN
INTRAMUSCULAR | Status: AC
  Filled 2022-11-03: qty 4

## 2022-11-03 MED ORDER — PANTOPRAZOLE SODIUM 40 MG PO TBEC
40.0000 mg | DELAYED_RELEASE_TABLET | Freq: Every day | ORAL | Status: DC
  Administered 2022-11-04 – 2022-11-07 (×4): 40 mg via ORAL
  Filled 2022-11-03 (×4): qty 1

## 2022-11-03 SURGICAL SUPPLY — 97 items
ADH SKN CLS APL DERMABOND .7 (GAUZE/BANDAGES/DRESSINGS) ×1
APL PRP STRL LF DISP 70% ISPRP (MISCELLANEOUS) ×1
BAG SPEC RTRVL C1550 15 (MISCELLANEOUS) ×1
BLADE CLIPPER SURG (BLADE) ×1 IMPLANT
BLADE SURG 11 STRL SS (BLADE) ×1 IMPLANT
CANISTER SUCT 3000ML PPV (MISCELLANEOUS) ×2 IMPLANT
CANNULA REDUCER 12-8 DVNC XI (CANNULA) ×2 IMPLANT
CATH THORACIC 28FR (CATHETERS) ×1 IMPLANT
CATH TROCAR 28FR (CATHETERS) IMPLANT
CHLORAPREP W/TINT 26 (MISCELLANEOUS) ×1 IMPLANT
CLIP TI MEDIUM 6 (CLIP) IMPLANT
CNTNR URN SCR LID CUP LEK RST (MISCELLANEOUS) ×5 IMPLANT
CONN ST 1/4X3/8 BEN (MISCELLANEOUS) IMPLANT
CONT SPEC 4OZ STRL OR WHT (MISCELLANEOUS) ×5
DEFOGGER SCOPE WARMER CLEARIFY (MISCELLANEOUS) ×1 IMPLANT
DERMABOND ADVANCED .7 DNX12 (GAUZE/BANDAGES/DRESSINGS) ×1 IMPLANT
DRAIN CHANNEL 28F RND 3/8 FF (WOUND CARE) IMPLANT
DRAPE ARM DVNC X/XI (DISPOSABLE) ×4 IMPLANT
DRAPE COLUMN DVNC XI (DISPOSABLE) ×1 IMPLANT
DRAPE CV SPLIT W-CLR ANES SCRN (DRAPES) ×1 IMPLANT
DRAPE HALF SHEET 40X57 (DRAPES) IMPLANT
DRAPE ORTHO SPLIT 77X108 STRL (DRAPES) ×1
DRAPE SURG ORHT 6 SPLT 77X108 (DRAPES) ×1 IMPLANT
ELECT BLADE 6.5 EXT (BLADE) IMPLANT
ELECT REM PT RETURN 9FT ADLT (ELECTROSURGICAL) ×1
ELECTRODE REM PT RTRN 9FT ADLT (ELECTROSURGICAL) ×1 IMPLANT
FORCEPS BPLR LNG DVNC XI (INSTRUMENTS) IMPLANT
FORCEPS CADIERE DVNC XI (FORCEP) IMPLANT
GAUZE KITTNER 4X8 (MISCELLANEOUS) ×1 IMPLANT
GAUZE SPONGE 4X4 12PLY STRL (GAUZE/BANDAGES/DRESSINGS) ×1 IMPLANT
GLOVE BIO SURGEON STRL SZ7.5 (GLOVE) ×2 IMPLANT
GLOVE SURG POLYISO LF SZ8 (GLOVE) ×1 IMPLANT
GOWN STRL REUS W/ TWL LRG LVL3 (GOWN DISPOSABLE) ×2 IMPLANT
GOWN STRL REUS W/ TWL XL LVL3 (GOWN DISPOSABLE) ×2 IMPLANT
GOWN STRL REUS W/TWL 2XL LVL3 (GOWN DISPOSABLE) ×1 IMPLANT
GOWN STRL REUS W/TWL LRG LVL3 (GOWN DISPOSABLE) ×2
GOWN STRL REUS W/TWL XL LVL3 (GOWN DISPOSABLE) ×2
GRASPER TIP-UP FEN DVNC XI (INSTRUMENTS) IMPLANT
HEMOSTAT SURGICEL 2X14 (HEMOSTASIS) ×1 IMPLANT
IRRIGATION STRYKERFLOW (MISCELLANEOUS) IMPLANT
IRRIGATOR STRYKERFLOW (MISCELLANEOUS)
KIT BASIN OR (CUSTOM PROCEDURE TRAY) ×1 IMPLANT
KIT TURNOVER KIT B (KITS) ×1 IMPLANT
NDL 22X1.5 STRL (OR ONLY) (MISCELLANEOUS) ×1 IMPLANT
NEEDLE 22X1.5 STRL (OR ONLY) (MISCELLANEOUS) ×1
NS IRRIG 1000ML POUR BTL (IV SOLUTION) ×3 IMPLANT
PACK CHEST (CUSTOM PROCEDURE TRAY) ×1 IMPLANT
PAD ARMBOARD 7.5X6 YLW CONV (MISCELLANEOUS) ×5 IMPLANT
RELOAD STAPLE 45 2.0 GRY DVNC (STAPLE) IMPLANT
RELOAD STAPLE 45 2.5 WHT DVNC (STAPLE) IMPLANT
RELOAD STAPLE 45 3.5 BLU DVNC (STAPLE) IMPLANT
RELOAD STAPLE 45 4.3 GRN DVNC (STAPLE) IMPLANT
RELOAD STAPLE 45 4.6 BLK DVNC (STAPLE) IMPLANT
SCISSORS LAP 5X35 DISP (ENDOMECHANICALS) IMPLANT
SEAL UNIV 5-12 XI (MISCELLANEOUS) ×4 IMPLANT
SEALANT PROGEL (MISCELLANEOUS) IMPLANT
SEALER LIGASURE MARYLAND 30 (ELECTROSURGICAL) IMPLANT
SET TRI-LUMEN FLTR TB AIRSEAL (TUBING) ×1 IMPLANT
SOL ELECTROSURG ANTI STICK (MISCELLANEOUS) ×1
SOLUTION ELECTROSURG ANTI STCK (MISCELLANEOUS) IMPLANT
SPONGE INTESTINAL PEANUT (DISPOSABLE) IMPLANT
SPONGE TONSIL 1 RF SGL (DISPOSABLE) IMPLANT
STAPLE RELOAD 45 2.0 GRAY DVNC (STAPLE) ×1
STAPLER 45 SUREFORM CVD DVNC (STAPLE) IMPLANT
STAPLER RELOAD 2.5X45 WHT DVNC (STAPLE) ×2
STAPLER RELOAD 3.5X45 BLU DVNC (STAPLE) ×3
STAPLER RELOAD 4.3X45 GRN DVNC (STAPLE) ×1
STAPLER RELOAD 45 4.6 BLK DVNC (STAPLE) ×2
STOPCOCK 4 WAY LG BORE MALE ST (IV SETS) ×1 IMPLANT
SUT MNCRL AB 3-0 PS2 18 (SUTURE) IMPLANT
SUT MON AB 2-0 CT1 36 (SUTURE) IMPLANT
SUT PDS AB 1 CTX 36 (SUTURE) IMPLANT
SUT PROLENE 4 0 RB 1 (SUTURE)
SUT PROLENE 4-0 RB1 .5 CRCL 36 (SUTURE) IMPLANT
SUT SILK 1 MH (SUTURE) ×1 IMPLANT
SUT SILK 1 TIES 10X30 (SUTURE) IMPLANT
SUT SILK 2 0 SH (SUTURE) IMPLANT
SUT SILK 2 0SH CR/8 30 (SUTURE) IMPLANT
SUT VIC AB 1 CTX 36 (SUTURE)
SUT VIC AB 1 CTX36XBRD ANBCTR (SUTURE) IMPLANT
SUT VIC AB 2-0 CT1 27 (SUTURE) ×1
SUT VIC AB 2-0 CT1 TAPERPNT 27 (SUTURE) ×1 IMPLANT
SUT VIC AB 3-0 SH 27 (SUTURE) ×2
SUT VIC AB 3-0 SH 27X BRD (SUTURE) ×2 IMPLANT
SUT VICRYL 0 TIES 12 18 (SUTURE) ×1 IMPLANT
SUT VICRYL 0 UR6 27IN ABS (SUTURE) ×2 IMPLANT
SUT VICRYL 2 TP 1 (SUTURE) IMPLANT
SYR 10ML LL (SYRINGE) ×1 IMPLANT
SYR 20ML LL LF (SYRINGE) ×1 IMPLANT
SYR 50ML LL SCALE MARK (SYRINGE) ×1 IMPLANT
SYSTEM RETRIEVAL ANCHOR 15 (MISCELLANEOUS) IMPLANT
SYSTEM SAHARA CHEST DRAIN ATS (WOUND CARE) ×1 IMPLANT
TAPE CLOTH 4X10 WHT NS (GAUZE/BANDAGES/DRESSINGS) ×1 IMPLANT
TIP APPLICATOR SPRAY EXTEND 16 (VASCULAR PRODUCTS) IMPLANT
TOWEL GREEN STERILE (TOWEL DISPOSABLE) ×1 IMPLANT
TRAY FOLEY MTR SLVR 16FR STAT (SET/KITS/TRAYS/PACK) ×1 IMPLANT
TUBING EXTENTION W/L.L. (IV SETS) ×1 IMPLANT

## 2022-11-03 NOTE — Anesthesia Postprocedure Evaluation (Signed)
Anesthesia Post Note  Patient: Kiandre Spagnolo  Procedure(s) Performed: XI ROBOTIC ASSISTED THORACOSCOPY-RIGHT UPPER LOBECTOMY (Right: Chest) LYMPH NODE BIOPSY (Right: Chest) INTERCOSTAL NERVE BLOCK (Right: Chest)     Patient location during evaluation: PACU Anesthesia Type: General Level of consciousness: awake and alert Pain management: pain level controlled Vital Signs Assessment: post-procedure vital signs reviewed and stable Respiratory status: spontaneous breathing, nonlabored ventilation, respiratory function stable and patient connected to nasal cannula oxygen Cardiovascular status: blood pressure returned to baseline and stable Postop Assessment: no apparent nausea or vomiting Anesthetic complications: no  No notable events documented.  Last Vitals:  Vitals:   11/03/22 1730 11/03/22 1741  BP: 123/83 139/86  Pulse: 60   Resp: 18 (!) 21  Temp: 36.4 C 36.6 C  SpO2: 95% 96%    Last Pain:  Vitals:   11/03/22 1741  TempSrc: Oral  PainSc:                  Bebe Moncure,W. EDMOND

## 2022-11-03 NOTE — Op Note (Addendum)
      [address]       [name] [address]             [phone]        11/03/2022  Patient:  Travis Gray Pre-Op Dx: Right upper lobe pulmonary nodule Post-op Dx:  same Procedure: - Robotic assisted right video thoracoscopy - Right upper lobectomy - Mediastinal lymph node sampling - Intercostal nerve block  Surgeon and Role:      * Emeterio Balke, Eliezer Lofts, MD - Primary  Assistant: Senaida Lange, PA-C  An experienced assistant was required given the complexity of this surgery and the standard of surgical care. The assistant was needed for exposure, dissection, suctioning, retraction of delicate tissues and sutures, instrument exchange and for overall help during this procedure.    Anesthesia  general EBL:  100 ml Blood Administration: none Specimen:  right upper lobe, hilar and mediastinal nodes  Drains: 28 F argyle chest tube in right chest Counts: correct   Indications: 67yo male with 1.9cm RUL PET avid pulmonary nodule concerning for primary lung cancer. There is also a second nodule in the right upper lobe that is concerning as well.  No evidence of nodal or metastatic disease. He has marginal lung function, and is a current smoker. He has agreed to stop smoking today.  We discussed the risks and benefits of R RATS, RULectomy.  Given that he has 2 separate nodules in the right upper lobe, I think that a lobectomy would be the best way to insure that both are remove for diagnostic purposes.  He is agreeable to proceed.   Findings: Normal anatomy.  Enlarged lymph nodes  Operative Technique: After the risks, benefits and alternatives were thoroughly discussed, the patient was brought to the operative theatre.  Anesthesia was induced, and the patient was then placed in a lateral decubitus position and was prepped and draped in normal sterile fashion.  An appropriate surgical pause was performed, and pre-operative antibiotics were dosed accordingly.  We began by  placing our 4 robotic ports in the the 7th intercostal space targeting the hilum of the lung.  A 12mm assistant port was placed in the 9th intercostal space in the anterior axillary line.  The robot was then docked and all instruments were passed under direct visualization.    The lung was then retracted superiorly, and the inferior pulmonary ligament was divided.  The hilum was mobilized anteriorly and posteriorly.  We identified the upper lobe pulmonary vein, and after careful isolation, it was divided with a vascular stapler.  We next moved to the pulmonary artery.  The artery was then divided with a vascular load stapler.  The bronchus to the upper lobe was then isolated.  After a test clamp, with good ventilation of the remaining lung, the bronchus was then divided.  The fissure was completed, and the specimen was passed into an endocatch bag.  It was removed from the anterior access site.    Lymph nodes were then sampled at hilum and mediastinum.  The chest was irrigated, and an air leak test was performed.  An intercostal nerve block was performed under direct visualization.  A 28 F chest tube was then placed, and we watch the remaining lobes re-expand.  The skin and soft tissue were closed with absorbable suture    The patient tolerated the procedure without any immediate complications, and was transferred to the PACU in stable condition.  Travis Gray

## 2022-11-03 NOTE — Transfer of Care (Signed)
Immediate Anesthesia Transfer of Care Note  Patient: Travis Gray  Procedure(s) Performed: XI ROBOTIC ASSISTED THORACOSCOPY-RIGHT UPPER LOBECTOMY (Right: Chest) LYMPH NODE BIOPSY (Right: Chest) INTERCOSTAL NERVE BLOCK (Right: Chest)  Patient Location: PACU  Anesthesia Type:General  Level of Consciousness: drowsy  Airway & Oxygen Therapy: Patient Spontanous Breathing and Patient connected to face mask  Post-op Assessment: Report given to RN and Post -op Vital signs reviewed and stable  Post vital signs: Reviewed and stable  Last Vitals:  Vitals Value Taken Time  BP 139/85 11/03/22 1524  Temp    Pulse 66 11/03/22 1529  Resp 14 11/03/22 1529  SpO2 95 % 11/03/22 1529  Vitals shown include unfiled device data.  Last Pain:  Vitals:   11/03/22 1032  TempSrc:   PainSc: 6       Patients Stated Pain Goal: 0 (11/03/22 1032)  Complications: No notable events documented.

## 2022-11-03 NOTE — H&P (Signed)
[address]       [place] [address]             [phone]                                                   Bartolomeo Sachar [facility] Medical Record #[id number] Date of Birth: Sep 16, 1955   Referring: Raechel Chute, MD Primary Care: Dale Cameron, MD Primary Cardiologist: None   Chief Complaint:        Chief Complaint  Patient presents with   Lung Lesion      New patient consultation, Chest CT 08/08/22/ PET Scan 08/23/22/ PFT' 08/25/22        History of Present Illness:    Travis Gray 67 y.o. male presents for surgical evaluation of a 1.9cm right upper lobe pulmonary nodule.  He has a long hx of smoking, and continues to smoke currently.  This was found on screen CT, and has shown interval growth.                   Past Medical History:  Diagnosis Date   Chronic headaches      migraines, 3-4x/yr   Degenerative disc disease, cervical     Genetic testing 03/20/2017    Multi-Cancer panel (83 genes) @ [facility] - No pathogenic mutations detected   GERD (gastroesophageal reflux disease)     Hypercholesterolemia     Hypertension     Obesity     Peripheral vascular disease (HCC)      groin and right leg stents   Skin cancer      arm/ face area.  Has had removed   Sleep apnea      CPAP   Tobacco abuse                 Past Surgical History:  Procedure Laterality Date   CARDIAC CATHETERIZATION       CATARACT EXTRACTION W/PHACO Left 04/11/2018    Procedure: CATARACT EXTRACTION PHACO AND INTRAOCULAR LENS PLACEMENT (IOC)  COMPLICATED LEFT;  Surgeon: Lockie Mola, MD;  Location: [facility];  Service: Ophthalmology;  Laterality: Left;  OMIDRIA sleep apnea   COLONOSCOPY WITH PROPOFOL N/A 11/25/2016    Procedure: COLONOSCOPY WITH PROPOFOL;  Surgeon: Toledo, Boykin Nearing, MD;  Location: [facility];  Service: Endoscopy;  Laterality: N/A;   ESOPHAGOGASTRODUODENOSCOPY (EGD) WITH PROPOFOL N/A 11/25/2016    Procedure:  ESOPHAGOGASTRODUODENOSCOPY (EGD) WITH PROPOFOL;  Surgeon: Toledo, Boykin Nearing, MD;  Location: [facility];  Service: Endoscopy;  Laterality: N/A;   HERNIA REPAIR        Inguinal,laparoscopic surgery   HOLEP-LASER ENUCLEATION OF THE PROSTATE WITH MORCELLATION N/A 03/19/2021    Procedure: HOLEP-LASER ENUCLEATION OF THE PROSTATE WITH MORCELLATION;  Surgeon: Sondra Come, MD;  Location: [facility];  Service: Urology;  Laterality: N/A;   PERIPHERAL VASCULAR CATHETERIZATION Right 05/21/2015    Procedure: Lower Extremity Angiography;  Surgeon: Annice Needy, MD;  Location: [facility];  Service: Cardiovascular;  Laterality: Right;   PERIPHERAL VASCULAR CATHETERIZATION   05/21/2015    Procedure: Lower Extremity Intervention;  Surgeon: Annice Needy, MD;  Location: [facility];  Service: Cardiovascular;;   radio-ablated therapy        completed in the pain clinic   RETINAL DETACHMENT  SURGERY        on right eye x 2               Family History  Problem Relation Age of Onset   Hypertension Mother     Breast cancer Mother 72   Uterine cancer Mother          unconfirmed; dx 62s; TAH/BSO; currently 67   Hyperlipidemia Father     Bladder Cancer Paternal Grandfather 72        smoker; deceased 3   Diabetes Maternal Grandmother     Non-Hodgkin's lymphoma Paternal Aunt          deceased 18            Tobacco Use History  Social History        Tobacco Use  Smoking Status Every Day   Current packs/day: 0.50   Average packs/day: 0.5 packs/day for 37.0 years (18.5 ttl pk-yrs)   Types: Cigarettes  Smokeless Tobacco Never  Tobacco Comments    1 pack will last 2-3 days--08/15/2022      Social History        Substance and Sexual Activity  Alcohol Use Yes   Alcohol/week: 0.0 standard drinks of alcohol    Comment: very occasional alcohol use - 3-4x/yr        Allergies  No Known Allergies           Current Outpatient Medications  Medication Sig Dispense Refill    amitriptyline (ELAVIL) 25 MG tablet TAKE 1 TABLET BY MOUTH AT BEDTIME 30 tablet 3   atorvastatin (LIPITOR) 20 MG tablet TAKE 1 TABLET BY MOUTH AT BEDTIME 90 tablet 1   clopidogrel (PLAVIX) 75 MG tablet TAKE 1 TABLET BY MOUTH ONCE DAILY 30 tablet 11   DULoxetine (CYMBALTA) 30 MG capsule TAKE 1 CAPSULE BY MOUTH ONCE DAILY 90 capsule 1   hydrochlorothiazide (HYDRODIURIL) 25 MG tablet TAKE 1 TABLET BY MOUTH ONCE DAILY 90 tablet 1   losartan (COZAAR) 100 MG tablet TAKE 1 TABLET BY MOUTH ONCE DAILY 90 tablet 3   metoprolol succinate (TOPROL-XL) 25 MG 24 hr tablet Take 1 tablet (25 mg total) by mouth daily. 90 tablet 3   omega-3 acid ethyl esters (LOVAZA) 1 g capsule Take 1 capsule (1 g total) by mouth daily. (Patient taking differently: Take 2 g by mouth daily.) 90 capsule 3   pantoprazole (PROTONIX) 40 MG tablet TAKE 1 TABLET BY MOUTH ONCE DAILY 90 tablet 1   vitamin B-12 (CYANOCOBALAMIN) 1000 MCG tablet Take 1,000 mcg by mouth daily.          No current facility-administered medications for this visit.        Review of Systems  Constitutional:  Negative for fever, malaise/fatigue and weight loss.  Respiratory:  Negative for cough and shortness of breath.   Cardiovascular:  Negative for chest pain.  Musculoskeletal:  Positive for back pain, joint pain and myalgias.  Neurological: Negative.         PHYSICAL EXAMINATION: BP (!) 156/96 (BP Location: Left Arm, Patient Position: Sitting, Cuff Size: Normal)   Pulse (!) 52   Resp 20   Ht 5\' 9"  (1.753 m)   Wt 164 lb 3.2 oz (74.5 kg)   SpO2 96%   BMI 24.25 kg/m  Physical Exam Constitutional:      General: He is not in acute distress.    Appearance: He is not ill-appearing.  HENT:     Head: Normocephalic and atraumatic.  Eyes:  Extraocular Movements: Extraocular movements intact.  Cardiovascular:     Rate and Rhythm: Bradycardia present.  Pulmonary:     Effort: Pulmonary effort is normal. No respiratory distress.  Abdominal:      General: Abdomen is flat. There is no distension.  Musculoskeletal:        General: Normal range of motion.     Cervical back: Normal range of motion.  Skin:    General: Skin is warm and dry.  Neurological:     General: No focal deficit present.     Mental Status: He is alert and oriented to person, place, and time.                I have independently reviewed the above radiology studies  and reviewed the findings with the patient.    Recent Lab Findings: Recent Labs       Lab Results  Component Value Date    WBC 5.3 06/13/2022    HGB 13.7 06/13/2022    HCT 39.9 06/13/2022    PLT 228.0 06/13/2022    GLUCOSE 99 06/13/2022    CHOL 142 06/13/2022    TRIG 242.0 (H) 06/13/2022    HDL 29.40 (L) 06/13/2022    LDLDIRECT 60.0 06/13/2022    LDLCALC 60 04/24/2013    ALT 12 06/13/2022    AST 15 06/13/2022    NA 142 06/13/2022    K 3.9 06/13/2022    CL 102 06/13/2022    CREATININE 1.05 06/13/2022    BUN 19 06/13/2022    CO2 31 06/13/2022    TSH 3.28 06/13/2022    INR 1.09 05/24/2015    HGBA1C 5.6 06/13/2022        Diagnostic Studies & Laboratory data:     Recent Radiology Findings:    Imaging Results  NM PET Image Initial (PI) Skull Base To Thigh (F-18 FDG)   Result Date: 08/29/2022 CLINICAL DATA:  Initial treatment strategy for pulmonary nodule. EXAM: NUCLEAR MEDICINE PET SKULL BASE TO THIGH TECHNIQUE: 8.9 mCi F-18 FDG was injected intravenously. Full-ring PET imaging was performed from the skull base to thigh after the radiotracer. CT data was obtained and used for attenuation correction and anatomic localization. Fasting blood glucose: 107 mg/dl COMPARISON:  Lung cancer screening CT 08/08/2022, 04/27/2021 FINDINGS: Mediastinal blood pool activity: SUV max 2.4 Liver activity: SUV max NA NECK: No hypermetabolic lymph nodes in the neck. Incidental CT findings: None. CHEST: Spiculated nodule of concern in the RIGHT upper lobe measures 19 mm (6/4) nodule has significant  metabolic activity SUV max equal 5.9. RIGHT upper lobe nodule more inferiorly present on comparison exams measuring 5 mm (image 52) without metabolic. No hypermetabolic mediastinal nodes Incidental CT findings: None. ABDOMEN/PELVIS: No abnormal hypermetabolic activity within the liver, pancreas, adrenal glands, or spleen. No hypermetabolic lymph nodes in the abdomen or pelvis. Incidental CT findings: Multiple diverticula of the descending colon and sigmoid colon without acute inflammation. Bi-iliac stents noted SKELETON: No focal hypermetabolic activity to suggest skeletal metastasis. Focal activity between the L1 and L2 spinous processes is favored degenerative/inflammatory (image 102 Incidental CT findings: Initial IMPRESSION: 1. Hypermetabolic spiculated nodule in the RIGHT upper lobe is concerning for primary bronchogenic carcinoma. 2. No evidence of metastatic adenopathy in the chest. 3. No evidence distant metastatic disease. 4. Stable nodule in the RIGHT upper lobe without metabolic activity. 5. Degenerative uptake the spinous process L1-L2. Electronically Signed   By: Genevive Bi [name]   On: 08/29/2022 14:54    CT CHEST  LCS NODULE F/U LOW DOSE WO CONTRAST   Result Date: 08/11/2022 CLINICAL DATA:  Abnormal lung cancer screening CT. Thirty-eight pack-year current smoker. EXAM: CT CHEST WITHOUT CONTRAST FOR LUNG CANCER SCREENING NODULE FOLLOW-UP TECHNIQUE: Multidetector CT imaging of the chest was performed following the standard protocol without IV contrast. RADIATION DOSE REDUCTION: This exam was performed according to the departmental dose-optimization program which includes automated exposure control, adjustment of the mA and/or kV according to patient size and/or use of iterative reconstruction technique. COMPARISON:  04/29/2022, 04/27/2021. FINDINGS: Cardiovascular: Heart size normal.  No pericardial effusion. Mediastinum/Nodes: No pathologically enlarged mediastinal or axillary lymph nodes. Hilar  regions are difficult to definitively evaluate without IV contrast. Esophagus is grossly unremarkable. Lungs/Pleura: Centrilobular and bullous paraseptal emphysema. Spiculated apical segment right upper lobe nodule has enlarged, now measuring 19.4 mm, previously 11.2 mm. No pleural fluid. Airway is unremarkable. Upper Abdomen: Liver margin is slightly irregular. Visualized portions of the liver, gallbladder, adrenal glands, kidneys, spleen, pancreas, stomach and bowel are otherwise grossly unremarkable. No upper abdominal adenopathy. Musculoskeletal: Degenerative changes in the spine. No worrisome lytic or sclerotic lesions. IMPRESSION: 1. Enlarging 19.4 mm spiculated apical segment right upper lobe nodule. Lung-RADS 4X, highly suspicious. Additional imaging evaluation or consultation with Pulmonology or Thoracic Surgery recommended. These results will be called to the ordering clinician or representative by the Radiologist Assistant, and communication documented in the PACS or [employer]. 2. Mild marginal irregularity the liver raises suspicion for cirrhosis. 3. Emphysema (ICD10-J43.9). Electronically Signed   By: Leanna Battles [name]   On: 08/11/2022 13:49         PFTs:   - FVC: 77% - FEV1: 55% -DLCO: 55%   IMPRESSION: 1. Hypermetabolic spiculated nodule in the RIGHT upper lobe is concerning for primary bronchogenic carcinoma. 2. No evidence of metastatic adenopathy in the chest. 3. No evidence distant metastatic disease. 4. Stable nodule in the RIGHT upper lobe without metabolic activity. 5. Degenerative uptake the spinous process L1-L2.      Assessment / Plan:   67yo male with 1.9cm RUL pulmonary nodule concerning for primary lung cancer.  No evidence of nodal or metastatic disease.  He has marginal lung function, and is a current smoker.  He has agreed to stop smoking today.  He will require a stress test.  We discussed the risks and benefits of R RATS, RULectomy, and he is agreeable  to proceed.

## 2022-11-03 NOTE — Discharge Instructions (Signed)
Robot-Assisted Thoracic Surgery, Care After The following information offers guidance on how to care for yourself after your procedure. Your health care provider may also give you more specific instructions. If you have problems or questions, contact your health care provider. What can I expect after the procedure? After the procedure, it is common to have: Some pain and aches in the area of your surgical incisions. Pain when breathing in (inhaling) and coughing. Tiredness (fatigue). Trouble sleeping. Constipation. Follow these instructions at home: Medicines Take over-the-counter and prescription medicines only as told by your health care provider. If you were prescribed an antibiotic medicine, take it as told by your health care provider. Do not stop taking the antibiotic even if you start to feel better. Talk with your health care provider about safe and effective ways to manage pain after your procedure. Pain management should fit your specific health needs. Take pain medicine before pain becomes severe. Relieving and controlling your pain will make breathing easier for you. Ask your health care provider if the medicine prescribed to you requires you to avoid driving or using machinery. Eating and drinking Follow instructions from your health care provider about eating or drinking restrictions. These will vary depending on what procedure you had. Your health care provider may recommend: A liquid diet or soft diet for the first few days. Meals that are smaller and more frequent. A diet of fruits, vegetables, whole grains, and low-fat proteins. Limiting foods that are high in fat and processed sugar, including fried or sweet foods. Incision care Follow instructions from your health care provider about how to take care of your incisions. Make sure you: Wash your hands with soap and water for at least 20 seconds before and after you change your bandage (dressing). If soap and water are not  available, use hand sanitizer. Change your dressing as told by your health care provider. Leave stitches (sutures), skin glue, or adhesive strips in place. These skin closures may need to stay in place for 2 weeks or longer. If adhesive strip edges start to loosen and curl up, you may trim the loose edges. Do not remove adhesive strips completely unless your health care provider tells you to do that. Check your incision area every day for signs of infection. Check for: Redness, swelling, or more pain. Fluid or blood. Warmth. Pus or a bad smell. Activity Return to your normal activities as told by your health care provider. Ask your health care provider what activities are safe for you. Ask your health care provider when it is safe for you to drive. Do not lift anything that is heavier than 10 lb (4.5 kg), or the limit that you are told, until your health care provider says that it is safe. Rest as told by your health care provider. Avoid sitting for a long time without moving. Get up to take short walks every 1-2 hours. This is important to improve blood flow and breathing. Ask for help if you feel weak or unsteady. Do exercises as told by your health care provider. Pneumonia prevention  Do deep breathing exercises and cough regularly as directed. This helps clear mucus and opens your lungs. Doing this helps prevent lung infection (pneumonia). If you were given an incentive spirometer, use it as told. An incentive spirometer is a tool that measures how well you are filling your lungs with each breath. Coughing may hurt less if you try to support your chest. This is called splinting. Try one of these when you  cough: Hold a pillow against your chest. Place the palms of both hands on top of your incision area. Do not use any products that contain nicotine or tobacco. These products include cigarettes, chewing tobacco, and vaping devices, such as e-cigarettes. If you need help quitting, ask your  health care provider. Avoid secondhand smoke. General instructions If you have a drainage tube: Follow instructions from your health care provider about how to take care of it. Do not travel by airplane after your tube is removed until your health care provider tells you it is safe. You may need to take these actions to prevent or treat constipation: Drink enough fluid to keep your urine pale yellow. Take over-the-counter or prescription medicines. Eat foods that are high in fiber, such as beans, whole grains, and fresh fruits and vegetables. Limit foods that are high in fat and processed sugars, such as fried or sweet foods. Keep all follow-up visits. This is important. Contact a health care provider if: You have redness, swelling, or more pain around an incision. You have fluid or blood coming from an incision. An incision feels warm to the touch. You have pus or a bad smell coming from an incision. You have a fever. You cannot eat or drink without vomiting. Your pain medicine is not controlling your pain. Get help right away if: You have chest pain. Your heart is beating quickly. You have trouble breathing. You have trouble speaking. You are confused. You feel weak or dizzy, or you faint. These symptoms may represent a serious problem that is an emergency. Do not wait to see if the symptoms will go away. Get medical help right away. Call your local emergency services (911 in the [place] Do not drive yourself to the hospital. Summary Talk with your health care provider about safe and effective ways to manage pain after your procedure. Pain management should fit your specific health needs. Return to your normal activities as told by your health care provider. Ask your health care provider what activities are safe for you. Do deep breathing exercises and cough regularly as directed. This helps to clear mucus and prevent pneumonia. If it hurts to cough, ease pain by holding a pillow  against your chest or by placing the palms of both hands over your incisions. This information is not intended to replace advice given to you by your health care provider. Make sure you discuss any questions you have with your health care provider. Document Revised: 10/03/2019 Document Reviewed: 10/04/2019 Elsevier Patient Education  2024 [employer]  Discharge Instructions:  1. You may shower, please wash incisions daily with soap and water and keep dry.  If you wish to cover wounds with dressing you may do so but please keep clean and change daily.  No tub baths or swimming until incisions have completely healed.  If your incisions become red or develop any drainage please call our office at [phone]  2. No Driving until cleared by Dr. Lucilla Lame office and you are no longer using narcotic pain medications  3. Fever of 101.5 for at least 24 hours with no source, please contact our office at [phone]  4. Activity- up as tolerated, please walk at least 3 times per day.  Avoid strenuous activity, no lifting, pushing, or pulling with your arms over 8-10 lbs for a minimum of 6 weeks  5. If any questions or concerns arise, please do not hesitate to contact our office at [phone]

## 2022-11-03 NOTE — Hospital Course (Addendum)
History of Present Illness:    Travis Gray 67 y.o. male presents for surgical evaluation of a 1.9cm right upper lobe pulmonary nodule.  He has a long hx of smoking, and continues to smoke currently.  This was found on screen CT, and has shown interval growth.     Dr. Cliffton Asters reviewed the patient's diagnostic studies and determined he would benefit from surgical intervention. He reviewed the treatment options as well as the risks and benefits of surgery with the patient. Travis Gray was agreeable to proceed with surgery.  Hospital Course: Travis Gray presented to [facility] and was brought to the operating room on 11/03/22. He underwent robotic assisted right upper lobectomy. He tolerated the procedure well, was extubated and transferred to the PACU in stable condition. It was felt he had controlled rate atrial fibrillation, Amiodarone PO 400mg  BID and Lopressor 12.5mg  BID was started. It was later felt this was NSR with ventricular bigeminy, Amiodarone was discontinued and he was restarted on home Toprol XL 25mg  daily with parameters. This was held due to sinus bradycardia with HR in the 50s. His blood pressure was well controlled, hydrochlorothiazide and Losartan were held. He was saturating well on room air on POD1. CXR was without a clear pneumothorax. He had a small air leak with cough. Chest tube was clamped and follow up CXR showed increased right apical pneumothorax. His chest tube was unclamped and there was a large rush of air. Chest tube was left in place to water seal.  On postop day 2, there was a small air leak that persisted.  The chest x-ray was stable.  The chest tube was left on waterseal.  By the third postoperative day, the airleak had subsided.  The chest tube was removed. Mobility and pulmonary hygiene were encouraged.  He regained complete independence with mobility.  Diet was started and advanced.  Appetite was poor due to suspect constipation.  He was given stool  softeners and laxative on postop days 1 and 2 without results.  Magnesium citrate was ordered along with a stool softeners on postop day 3.

## 2022-11-03 NOTE — Anesthesia Procedure Notes (Signed)
Procedure Name: Intubation Date/Time: 11/03/2022 12:32 PM  Performed by: Elliot Dally, CRNAPre-anesthesia Checklist: Patient identified, Emergency Drugs available, Suction available and Patient being monitored Patient Re-evaluated:Patient Re-evaluated prior to induction Oxygen Delivery Method: Circle System Utilized Preoxygenation: Pre-oxygenation with 100% oxygen Induction Type: IV induction Ventilation: Mask ventilation without difficulty Laryngoscope Size: Miller and 2 Grade View: Grade I Tube type: Oral Endobronchial tube: Left, Double lumen EBT, EBT position confirmed by auscultation and EBT position confirmed by fiberoptic bronchoscope and 39 Fr Number of attempts: 1 Airway Equipment and Method: Stylet and Oral airway Placement Confirmation: ETT inserted through vocal cords under direct vision, positive ETCO2 and breath sounds checked- equal and bilateral Tube secured with: Tape Dental Injury: Teeth and Oropharynx as per pre-operative assessment

## 2022-11-03 NOTE — Brief Op Note (Signed)
11/03/2022  3:23 PM  PATIENT:  Travis Gray  67 y.o. male  PRE-OPERATIVE DIAGNOSIS:  Right Upper Lobe Pulmonary Nodule  POST-OPERATIVE DIAGNOSIS:  Right Upper Lobe Pulmonary Nodule  PROCEDURE:  Procedure(s): XI ROBOTIC ASSISTED THORACOSCOPY-RIGHT UPPER LOBECTOMY (Right) LYMPH NODE BIOPSY (Right) INTERCOSTAL NERVE BLOCK (Right)  SURGEON:  Surgeons and Role:    * Lightfoot, Eliezer Lofts, MD - Primary  PHYSICIAN ASSISTANT: Aloha Gell PA-C  ASSISTANTS: none   ANESTHESIA:   local and general  EBL:  Minimal  BLOOD ADMINISTERED:none  DRAINS:  Right pleural tube    LOCAL MEDICATIONS USED:  OTHER Exparel  SPECIMEN:  Source of Specimen:  RUL, lymph nodes  DISPOSITION OF SPECIMEN:  PATHOLOGY  COUNTS:  YES  TOURNIQUET:  * No tourniquets in log *  DICTATION: .Dragon Dictation  PLAN OF CARE: Admit to inpatient   PATIENT DISPOSITION:  PACU - hemodynamically stable.   Delay start of Pharmacological VTE agent (>24hrs) due to surgical blood loss or risk of bleeding: no

## 2022-11-03 NOTE — Progress Notes (Signed)
   11/03/22 2140  BiPAP/CPAP/SIPAP  $ Non-Invasive Ventilator  Non-Invasive Vent Set Up  $ Non-Invasive Home Ventilator  Initial  $ Face Mask Medium Yes  BiPAP/CPAP/SIPAP Pt Type Adult  BiPAP/CPAP/SIPAP Resmed  Mask Type Nasal mask  Mask Size Medium  PEEP 6 cmH20  Flow Rate 1 lpm  Patient Home Equipment No  Auto Titrate No  Nasal massage performed Yes  CPAP/SIPAP surface wiped down Yes  Safety Check Completed by RT for Home Unit Yes, no issues noted

## 2022-11-04 ENCOUNTER — Encounter (HOSPITAL_COMMUNITY): Payer: Self-pay | Admitting: Thoracic Surgery (Cardiothoracic Vascular Surgery)

## 2022-11-04 ENCOUNTER — Inpatient Hospital Stay (HOSPITAL_COMMUNITY): Payer: Medicare HMO

## 2022-11-04 LAB — CBC
HCT: 35.2 % — ABNORMAL LOW (ref 39.0–52.0)
Hemoglobin: 12.2 g/dL — ABNORMAL LOW (ref 13.0–17.0)
MCH: 31.8 pg (ref 26.0–34.0)
MCHC: 34.7 g/dL (ref 30.0–36.0)
MCV: 91.7 fL (ref 80.0–100.0)
Platelets: 194 10*3/uL (ref 150–400)
RBC: 3.84 MIL/uL — ABNORMAL LOW (ref 4.22–5.81)
RDW: 13.2 % (ref 11.5–15.5)
WBC: 8.9 10*3/uL (ref 4.0–10.5)
nRBC: 0 % (ref 0.0–0.2)

## 2022-11-04 LAB — BASIC METABOLIC PANEL
Anion gap: 8 (ref 5–15)
BUN: 19 mg/dL (ref 8–23)
CO2: 27 mmol/L (ref 22–32)
Calcium: 8.5 mg/dL — ABNORMAL LOW (ref 8.9–10.3)
Chloride: 103 mmol/L (ref 98–111)
Creatinine, Ser: 1.22 mg/dL (ref 0.61–1.24)
GFR, Estimated: >60 mL/min (ref >60)
Glucose, Bld: 136 mg/dL — ABNORMAL HIGH (ref 70–99)
Potassium: 4.6 mmol/L (ref 3.5–5.1)
Sodium: 138 mmol/L (ref 135–145)

## 2022-11-04 LAB — MAGNESIUM: Magnesium: 1.7 mg/dL (ref 1.7–2.4)

## 2022-11-04 MED ORDER — MAGNESIUM OXIDE -MG SUPPLEMENT 400 (240 MG) MG PO TABS
400.0000 mg | ORAL_TABLET | Freq: Two times a day (BID) | ORAL | Status: AC
  Administered 2022-11-04 – 2022-11-05 (×4): 400 mg via ORAL
  Filled 2022-11-04 (×4): qty 1

## 2022-11-04 NOTE — Plan of Care (Signed)
  Problem: Activity: Goal: Risk for activity intolerance will decrease Outcome: Progressing   Problem: Cardiac: Goal: Will achieve and/or maintain hemodynamic stability Outcome: Progressing   Problem: Respiratory: Goal: Respiratory status will improve Outcome: Progressing   Problem: Pain Management: Goal: Pain level will decrease Outcome: Progressing   Problem: Skin Integrity: Goal: Wound healing without signs and symptoms infection will improve Outcome: Progressing

## 2022-11-04 NOTE — Progress Notes (Addendum)
      [address]       [place] [address]             [phone]      1 Day Post-Op Procedure(s) (LRB): XI ROBOTIC ASSISTED THORACOSCOPY-RIGHT UPPER LOBECTOMY (Right) LYMPH NODE BIOPSY (Right) INTERCOSTAL NERVE BLOCK (Right) Subjective: Pt states he has right sided chest and back pain as well as pain around the tube especially with deep breaths. Pt has not received any pain medication other than Tylenol since surgery. No other complaints.  Objective: Vital signs in last 24 hours: Temp:  [97.5 F (36.4 C)-98.7 F (37.1 C)] 98.7 F (37.1 C) (10/11 0410) Pulse Rate:  [51-89] 51 (10/11 0410) Cardiac Rhythm: Normal sinus rhythm (10/10 1903) Resp:  [14-26] 14 (10/11 0410) BP: (118-148)/(64-99) 127/85 (10/11 0410) SpO2:  [61 %-97 %] 95 % (10/11 0410) Weight:  [77.1 kg] 77.1 kg (10/10 0948)  Hemodynamic parameters for last 24 hours:    Intake/Output from previous day: 10/10 0701 - 10/11 0700 In: 1100 [I.V.:900; IV Piggyback:200] Out: 840 [Urine:500; Blood:100; Chest Tube:240] Intake/Output this shift: No intake/output data recorded.  General appearance: alert, cooperative, and no distress Neurologic: intact Heart: regular rate and rhythm-sinus bradycardia, no murmur Lungs: clear to auscultation bilaterally Abdomen: soft, non-tender; bowel sounds normal; no masses,  no organomegaly Extremities: SCDs in place Wound: Clean and dry dressing in place, serosanguinous drainage from chest tube in [place]  Lab Results: Recent Labs    11/01/22 1003 11/04/22 0237  WBC 5.5 8.9  HGB 14.4 12.2*  HCT 42.4 35.2*  PLT 228 194   BMET:  Recent Labs    11/01/22 1003 11/04/22 0237  NA 138 138  K 3.8 4.6  CL 100 103  CO2 29 27  GLUCOSE 95 136*  BUN 16 19  CREATININE 1.05 1.22  CALCIUM 9.4 8.5*    PT/INR:  Recent Labs    11/01/22 1003  LABPROT 13.3  INR 1.0   ABG No results found for: "PHART", "HCO3", "TCO2", "ACIDBASEDEF", "O2SAT" CBG (last 3)  No  results for input(s): "GLUCAP" in the last 72 hours.  Assessment/Plan: S/P Procedure(s) (LRB): XI ROBOTIC ASSISTED THORACOSCOPY-RIGHT UPPER LOBECTOMY (Right) LYMPH NODE BIOPSY (Right) INTERCOSTAL NERVE BLOCK (Right)  Neuro: Pain this AM, continue current pain regimen  CV: SB-NSR, HR 50s-60s. Was started on PO Amiodarone 400mg  BID and Lopressor 12.5mg  BID yesterday.Marland Kitchenatrial fibrillation? Takes Toprol XL 25mg  daily at home. Will restart this AM with parameters. SBP 120s, will hold Losartan and hydrochlorothiazide for now. Pt on Plavix at home, will restart at discharge.  Pulm: Saturating well on RA this AM. Hx of OSA, uses CPAP at night. CT output 240cc/24hrs of serosanguinous drainage. CXR without clear pneumothorax. CT with tidaling and small air leak with cough.   GI: No nausea or vomiting, ate breakfast without difficulty.   Endo: No hx of DM  Renal: Cr elevated at 1.22 from 1.05 yesterday, looks like around 1.05 is his baseline. OK UO. Will monitor.   Expected postop ABLA: H/H 12.2/35.2, not clinically significant at this time  DVT Prophylaxis: Lovenox  Dispo: Leave CT to water seal for now. CT management per Dr. Cliffton Gray   LOS: 1 day    Travis Reichmann, PA-C 11/04/2022   Agree with above Clamping trial today  Travis Gray

## 2022-11-04 NOTE — Progress Notes (Signed)
      [address]       [place] [address]             [phone]      CXR pending final read but looks like a new small right pneumothorax with clamping trial. Patient is without respiratory distress and continues to saturate well on RA but has increased discomfort on the right side especially with cough. After unclamping the tube there was a large rush of air. Will leave CT in place to water seal today and get a follow up CXR in the AM.   [name], PA-C

## 2022-11-04 NOTE — Discharge Summary (Signed)
Physician Discharge Summary  Patient ID: Travis Gray MRN: [id number] DOB/AGE: Jul 29, 1955 67 y.o.  Admit date: 11/03/2022 Discharge date: 11/07/2022  Admission Diagnoses:  Discharge Diagnoses:  Principal Problem:   Lung nodule   Discharged Condition: stable  History of Present Illness:    Travis Gray 67 y.o. male presents for surgical evaluation of a 1.9cm right upper lobe pulmonary nodule.  He has a long hx of smoking, and continues to smoke currently.  This was found on screen CT, and has shown interval growth.     Dr. Cliffton Asters reviewed the patient's diagnostic studies and determined he would benefit from surgical intervention. He reviewed the treatment options as well as the risks and benefits of surgery with the patient. Travis Gray was agreeable to proceed with surgery.  Hospital Course: Travis Gray presented to [facility] and was brought to the operating room on 11/03/22. He underwent robotic assisted right upper lobectomy. He tolerated the procedure well, was extubated and transferred to the PACU in stable condition. It was felt he had controlled rate atrial fibrillation, Amiodarone PO 400mg  BID and Lopressor 12.5mg  BID was started. It was later felt this was NSR with ventricular bigeminy, Amiodarone was discontinued and he was restarted on home Toprol XL 25mg  daily with parameters. This was held due to sinus bradycardia with HR in the 50s. His blood pressure was well controlled, hydrochlorothiazide and Losartan were held. He was saturating well on room air on POD1. CXR was without a clear pneumothorax. He had a small air leak with cough. Chest tube was clamped and follow up CXR showed increased right apical pneumothorax. His chest tube was unclamped and there was a large rush of air. Chest tube was left in place to water seal.  On postop day 2, there was a small air leak that persisted.  The chest x-ray was stable.  The chest tube was left on waterseal.  By the third  postoperative day, the airleak had subsided.  The chest tube was removed. Mobility and pulmonary hygiene were encouraged.  He regained complete independence with mobility.  Diet was started and advanced.  Appetite was poor due to suspect constipation.  He was given stool softeners and laxative on postop days 1 and 2 without results.  Magnesium citrate was ordered along with a stool softeners on postop day 3. His bowels began moving. Follow up CXR showed a stable possible tiny right apical pneumothorax. The patient was ambulating well on RA. His incisions were healing well without sign of infection. He was felt stable for discharge home.   Consults: None  Significant Diagnostic Studies:  CLINICAL DATA:  Initial treatment strategy for pulmonary nodule.   EXAM: NUCLEAR MEDICINE PET SKULL BASE TO THIGH   TECHNIQUE: 8.9 mCi F-18 FDG was injected intravenously. Full-ring PET imaging was performed from the skull base to thigh after the radiotracer. CT data was obtained and used for attenuation correction and anatomic localization.   Fasting blood glucose: 107 mg/dl   COMPARISON:  Lung cancer screening CT 08/08/2022, 04/27/2021   FINDINGS: Mediastinal blood pool activity: SUV max 2.4   Liver activity: SUV max NA   NECK: No hypermetabolic lymph nodes in the neck.   Incidental CT findings: None.   CHEST: Spiculated nodule of concern in the RIGHT upper lobe measures 19 mm (6/4) nodule has significant metabolic activity SUV max equal 5.9.   RIGHT upper lobe nodule more inferiorly present on comparison exams measuring 5 mm (image 52) without metabolic.  No hypermetabolic mediastinal nodes   Incidental CT findings: None.   ABDOMEN/PELVIS: No abnormal hypermetabolic activity within the liver, pancreas, adrenal glands, or spleen. No hypermetabolic lymph nodes in the abdomen or pelvis.   Incidental CT findings: Multiple diverticula of the descending colon and sigmoid colon without acute  inflammation. Bi-iliac stents noted   SKELETON: No focal hypermetabolic activity to suggest skeletal metastasis.   Focal activity between the L1 and L2 spinous processes is favored degenerative/inflammatory (image 102   Incidental CT findings: Initial   IMPRESSION: 1. Hypermetabolic spiculated nodule in the RIGHT upper lobe is concerning for primary bronchogenic carcinoma. 2. No evidence of metastatic adenopathy in the chest. 3. No evidence distant metastatic disease. 4. Stable nodule in the RIGHT upper lobe without metabolic activity. 5. Degenerative uptake the spinous process L1-L2.     Electronically Signed   By: Genevive Bi [name]   On: 08/29/2022 14:54  Treatments: surgery: 11/03/2022   Patient:  Travis Gray Pre-Op Dx: Right upper lobe pulmonary nodule Post-op Dx:  same Procedure: - Robotic assisted right video thoracoscopy - Right upper lobectomy - Mediastinal lymph node sampling - Intercostal nerve block   Surgeon and Role:      * Lightfoot, Eliezer Lofts, MD - Primary  PATHOLOGY: Clinical History: right upper lobe pulmonary nodule (cm)  FINAL MICROSCOPIC DIAGNOSIS:  A.LYMPH NODE, LEVEL 7, EXCISION:      One lymph node, negative for metastatic carcinoma (0/1).  B. LYMPH NODE, HILAR, EXCISION:      One lymph node, negative for metastatic carcinoma (0/1).  C. LYMPH NODE, LEVEL 4, EXCISION:      One lymph node, negative for metastatic carcinoma (0/1).  D. LUNG, RIGHT UPPER LOBE, LOBECTOMY:      Organizing pneumonia with chronic inflammation, elastosis, fibrosis and calcifications.      Focus of necrotic nodule with chronic inflammation and calcifications. 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      Three lymph nodes, negative for metastatic carcinoma (0/3).   INTRAOPERATIVE DIAGNOSIS:  D.  Lung, Right Upper Lobe: "no malignancy seen. Subpleural fibroelastotic nodule with  inflammation." Intraoperative diagnosis rendered by Dr. Venetia Night at 16:05 on 03 November 2022.   Discharge Exam: Blood pressure 131/89, pulse 60, temperature 98.7 F (37.1 C), temperature source Oral, resp. rate 14, height 5\' 9"  (1.753 m), weight 77.1 kg, SpO2 97%. General appearance: alert, cooperative, and no distress Neurologic: intact Heart: regular rate and rhythm-sinus bradycardia, no murmur Lungs: clear to auscultation bilaterally Abdomen: soft, non-tender; bowel sounds normal; no masses,  no organomegaly Extremities: extremities normal, atraumatic, no cyanosis or edema Wound: Clean and dry incisions without sign of infection  Disposition: Discharge disposition: 01-Home or Self Care        Allergies as of 11/07/2022   No Known Allergies      Medication List     STOP taking these medications    hydrochlorothiazide 25 MG tablet Commonly known as: HYDRODIURIL   losartan 100 MG tablet Commonly known as: COZAAR   nystatin cream Commonly known as: MYCOSTATIN       TAKE these medications    amitriptyline 25 MG tablet Commonly known as: ELAVIL TAKE 1 TABLET BY MOUTH AT BEDTIME   atorvastatin 20 MG tablet Commonly known as: LIPITOR TAKE 1 TABLET BY MOUTH AT BEDTIME   clopidogrel 75 MG tablet Commonly known as: PLAVIX TAKE 1 TABLET BY MOUTH ONCE DAILY   cyanocobalamin 1000 MCG tablet Commonly known as: VITAMIN B12 Take 1,000  mcg by mouth in the morning.   DULoxetine 30 MG capsule Commonly known as: CYMBALTA TAKE 1 CAPSULE BY MOUTH ONCE DAILY   FISH OIL PO Take 2 capsules by mouth in the morning.   metoprolol succinate 25 MG 24 hr tablet Commonly known as: TOPROL-XL Take 1 tablet (25 mg total) by mouth daily.   oxyCODONE 5 MG immediate release tablet Commonly known as: Oxy IR/ROXICODONE Take 1 tablet (5 mg total) by mouth every 6 (six) hours as needed for moderate pain.   pantoprazole 40 MG tablet Commonly known as: PROTONIX TAKE 1 TABLET  BY MOUTH ONCE DAILY        Follow-up Information     [name], MD Follow up on 11/11/2022.   Specialty: Cardiothoracic Surgery Why: Follow up appointment is at 12:40PM Contact information: [address] [place] [address] [phone]                 Signed: [name], PA-C  11/07/2022, 4:30 PM

## 2022-11-04 NOTE — Plan of Care (Signed)

## 2022-11-04 NOTE — Progress Notes (Signed)
Mobility Specialist Progress Note:    11/04/22 0952  Mobility  Activity Ambulated independently in hallway  Level of Assistance Standby assist, set-up cues, supervision of patient - no hands on  Assistive Device None  Distance Ambulated (ft) 415 ft  Range of Motion/Exercises Active  Activity Response Tolerated well  Mobility Referral Yes  $Mobility charge 1 Mobility  Mobility Specialist Start Time (ACUTE ONLY) [id number]  Mobility Specialist Stop Time (ACUTE ONLY) 1005  Mobility Specialist Time Calculation (min) (ACUTE ONLY) 13 min   Pt received in bed, wife at bedside, agreeable to mobility session. Ambulated independently in hallway, no AD required. Tolerated well, VSS throughout. Returned pt to room, all needs met.    Feliciana Rossetti Mobility Specialist Please contact via Special educational needs teacher or  Rehab office at [phone]

## 2022-11-05 ENCOUNTER — Inpatient Hospital Stay (HOSPITAL_COMMUNITY): Payer: Medicare HMO

## 2022-11-05 LAB — COMPREHENSIVE METABOLIC PANEL
ALT: 12 U/L (ref 0–44)
AST: 22 U/L (ref 15–41)
Albumin: 3.1 g/dL — ABNORMAL LOW (ref 3.5–5.0)
Alkaline Phosphatase: 49 U/L (ref 38–126)
Anion gap: 7 (ref 5–15)
BUN: 19 mg/dL (ref 8–23)
CO2: 28 mmol/L (ref 22–32)
Calcium: 8.1 mg/dL — ABNORMAL LOW (ref 8.9–10.3)
Chloride: 103 mmol/L (ref 98–111)
Creatinine, Ser: 1.02 mg/dL (ref 0.61–1.24)
GFR, Estimated: >60 mL/min (ref >60)
Glucose, Bld: 123 mg/dL — ABNORMAL HIGH (ref 70–99)
Potassium: 3.3 mmol/L — ABNORMAL LOW (ref 3.5–5.1)
Sodium: 138 mmol/L (ref 135–145)
Total Bilirubin: 0.5 mg/dL (ref 0.3–1.2)
Total Protein: 5.5 g/dL — ABNORMAL LOW (ref 6.5–8.1)

## 2022-11-05 LAB — CBC
HCT: 30.8 % — ABNORMAL LOW (ref 39.0–52.0)
Hemoglobin: 10.4 g/dL — ABNORMAL LOW (ref 13.0–17.0)
MCH: 31.4 pg (ref 26.0–34.0)
MCHC: 33.8 g/dL (ref 30.0–36.0)
MCV: 93.1 fL (ref 80.0–100.0)
Platelets: 172 10*3/uL (ref 150–400)
RBC: 3.31 MIL/uL — ABNORMAL LOW (ref 4.22–5.81)
RDW: 13.5 % (ref 11.5–15.5)
WBC: 8.2 10*3/uL (ref 4.0–10.5)
nRBC: 0 % (ref 0.0–0.2)

## 2022-11-05 MED ORDER — POTASSIUM CHLORIDE CRYS ER 20 MEQ PO TBCR
20.0000 meq | EXTENDED_RELEASE_TABLET | Freq: Four times a day (QID) | ORAL | Status: AC
  Administered 2022-11-05 (×2): 20 meq via ORAL
  Filled 2022-11-05 (×2): qty 1

## 2022-11-05 MED ORDER — LACTULOSE 10 GM/15ML PO SOLN: 10.0000 g | Freq: Every day | ORAL | Status: DC | PRN

## 2022-11-05 MED ORDER — POLYETHYLENE GLYCOL 3350 17 G PO PACK
17.0000 g | PACK | Freq: Every day | ORAL | Status: DC | PRN
  Administered 2022-11-05: 17 g via ORAL
  Filled 2022-11-05 (×2): qty 1

## 2022-11-05 NOTE — Progress Notes (Signed)
      [address]       [name] [address]             [phone]      2 Days Post-Op Procedure(s) (LRB): XI ROBOTIC ASSISTED THORACOSCOPY-RIGHT UPPER LOBECTOMY (Right) LYMPH NODE BIOPSY (Right) INTERCOSTAL NERVE BLOCK (Right) Subjective: Out walking in the hall this morning. Having some soreness but overall feels good.  Tolerating diet, no nausea.  Objective: Vital signs in last 24 hours: Temp:  [97.7 F (36.5 C)-98.7 F (37.1 C)] 97.7 F (36.5 C) (10/12 0730) Pulse Rate:  [53-60] 53 (10/12 0420) Cardiac Rhythm: Sinus bradycardia (10/12 0700) Resp:  [13-22] 13 (10/12 0420) BP: (112-139)/(77-90) 125/90 (10/12 0730) SpO2:  [90 %-93 %] 90 % (10/12 0420)     Intake/Output from previous day: 10/11 0701 - 10/12 0700 In: 480 [P.O.:480] Out: 50 [Chest Tube:50] Intake/Output this shift: Total I/O In: -  Out: 40 [Chest Tube:40]  General appearance: alert, cooperative, and no distress Neurologic: intact Heart: RRR Lungs: On RA with no resp distress, stable sats. Small air leak with cough. CXR showing the right lung clear and well expanded.  Extremities: no edema Wound: port incisions are intact and dry. The chest tube is secure.  Lab Results: Recent Labs    11/04/22 0237 11/05/22 0223  WBC 8.9 8.2  HGB 12.2* 10.4*  HCT 35.2* 30.8*  PLT 194 172   BMET:  Recent Labs    11/04/22 0237 11/05/22 0223  NA 138 138  K 4.6 3.3*  CL 103 103  CO2 27 28  GLUCOSE 136* 123*  BUN 19 19  CREATININE 1.22 1.02  CALCIUM 8.5* 8.1*    PT/INR: No results for input(s): "LABPROT", "INR" in the last 72 hours. ABG No results found for: "PHART", "HCO3", "TCO2", "ACIDBASEDEF", "O2SAT" CBG (last 3)  No results for input(s): "GLUCAP" in the last 72 hours.  Assessment/Plan: S/P Procedure(s) (LRB): XI ROBOTIC ASSISTED THORACOSCOPY-RIGHT UPPER LOBECTOMY (Right) LYMPH NODE BIOPSY (Right) INTERCOSTAL NERVE BLOCK (Right)  -POD2 right robotic-assisted right upper  lobectomy for suspected primary lung cancer. Path pending. Failed clamp trial yesterday and has a small but consistent air leak today. CXR stable with no PTX. Will leave the CT to water seal. Continue to mobilize and work on [employer].   -H/O HTN- BP controlled on his Toprol XL.   -RENAL: normal function. K+ 3.3, will supplement.   -DVT PPX- on SubQ enoxaparin daily, ambulate.     LOS: 2 days    Leary Roca, PA-C [phone] 11/05/2022

## 2022-11-05 NOTE — Progress Notes (Signed)
Mobility Specialist Progress Note:   11/05/22 0900  Mobility  Activity Ambulated independently in hallway  Level of Assistance Modified independent, requires aide device or extra time  Assistive Device None  Distance Ambulated (ft) 340 ft  Activity Response Tolerated well  Mobility Referral Yes  $Mobility charge 1 Mobility  Mobility Specialist Start Time (ACUTE ONLY) 0854  Mobility Specialist Stop Time (ACUTE ONLY) 0903  Mobility Specialist Time Calculation (min) (ACUTE ONLY) 9 min    Pre Mobility: 64 HR,  90% SpO2 During Mobility: 84 HR,  91% SpO2 Post Mobility:  82 HR, 90% SpO2  Received pt in bed having no complaints and agreeable to mobility. Pt was asymptomatic throughout ambulation and returned to room w/o fault. Left in bed w/ call bell in reach and all needs met.   D'Vante Earlene Plater Mobility Specialist Please contact via Special educational needs teacher or Rehab office at [phone]

## 2022-11-05 NOTE — Plan of Care (Signed)
Problem: Education: Goal: Knowledge of disease or condition will improve Outcome: Progressing Goal: Knowledge of the prescribed therapeutic regimen will improve Outcome: Progressing   Problem: Activity: Goal: Risk for activity intolerance will decrease Outcome: Progressing   Problem: Cardiac: Goal: Will achieve and/or maintain hemodynamic stability Outcome: Progressing   Problem: Clinical Measurements: Goal: Postoperative complications will be avoided or minimized Outcome: Progressing   Problem: Respiratory: Goal: Respiratory status will improve Outcome: Progressing   Problem: Pain Management: Goal: Pain level will decrease Outcome: Progressing   Problem: Skin Integrity: Goal: Wound healing without signs and symptoms infection will improve Outcome: Progressing   Problem: Education: Goal: Knowledge of General Education information will improve Description: Including pain rating scale, medication(s)/side effects and non-pharmacologic comfort measures Outcome: Progressing   Problem: Health Behavior/Discharge Planning: Goal: Ability to manage health-related needs will improve Outcome: Progressing   Problem: Clinical Measurements: Goal: Ability to maintain clinical measurements within normal limits will improve Outcome: Progressing Goal: Will remain free from infection Outcome: Progressing Goal: Diagnostic test results will improve Outcome: Progressing Goal: Respiratory complications will improve Outcome: Progressing Goal: Cardiovascular complication will be avoided Outcome: Progressing   Problem: Activity: Goal: Risk for activity intolerance will decrease Outcome: Progressing   Problem: Nutrition: Goal: Adequate nutrition will be maintained Outcome: Progressing   Problem: Elimination: Goal: Will not experience complications related to bowel motility Outcome: Progressing Goal: Will not experience complications related to urinary retention Outcome:  Progressing   Problem: Pain Managment: Goal: General experience of comfort will improve Outcome: Progressing   Problem: Safety: Goal: Ability to remain free from injury will improve Outcome: Progressing   Problem: Skin Integrity: Goal: Risk for impaired skin integrity will decrease Outcome: Progressing

## 2022-11-06 ENCOUNTER — Inpatient Hospital Stay (HOSPITAL_COMMUNITY): Payer: Medicare HMO

## 2022-11-06 LAB — BASIC METABOLIC PANEL
Anion gap: 9 (ref 5–15)
BUN: 12 mg/dL (ref 8–23)
CO2: 25 mmol/L (ref 22–32)
Calcium: 8 mg/dL — ABNORMAL LOW (ref 8.9–10.3)
Chloride: 102 mmol/L (ref 98–111)
Creatinine, Ser: 0.93 mg/dL (ref 0.61–1.24)
GFR, Estimated: >60 mL/min (ref >60)
Glucose, Bld: 100 mg/dL — ABNORMAL HIGH (ref 70–99)
Potassium: 4 mmol/L (ref 3.5–5.1)
Sodium: 136 mmol/L (ref 135–145)

## 2022-11-06 MED ORDER — MAGNESIUM CITRATE PO SOLN
0.5000 | Freq: Once | ORAL | Status: DC
  Filled 2022-11-06: qty 296

## 2022-11-06 MED ORDER — LACTULOSE 10 GM/15ML PO SOLN: 20.0000 g | Freq: Every day | ORAL | Status: DC | PRN

## 2022-11-06 NOTE — Progress Notes (Signed)
   11/06/22 2100  BiPAP/CPAP/SIPAP  BiPAP/CPAP/SIPAP Pt Type Adult  BiPAP/CPAP/SIPAP Resmed  Mask Type Nasal mask  Mask Size Medium  EPAP 6 cmH2O  Patient Home Equipment No  Auto Titrate No    Patient stated he would place himself on CPAP when ready. CPAP within patients reach.

## 2022-11-06 NOTE — Progress Notes (Signed)
      [address]       [place] [address]             [phone]      3 Days Post-Op Procedure(s) (LRB): XI ROBOTIC ASSISTED THORACOSCOPY-RIGHT UPPER LOBECTOMY (Right) LYMPH NODE BIOPSY (Right) INTERCOSTAL NERVE BLOCK (Right) Subjective:  No appetita and says abdomen is "uncomfortable" as he has had no BM for 3 days. No nausea.   No shortness of breath on RA.    Objective: Vital signs in last 24 hours: Temp:  [97.6 F (36.4 C)-99.2 F (37.3 C)] 98.1 F (36.7 C) (10/13 0728) Pulse Rate:  [50-70] 50 (10/13 0402) Cardiac Rhythm: Sinus bradycardia (10/13 0700) Resp:  [14-20] 14 (10/13 0402) BP: (114-144)/(74-86) 119/74 (10/13 0402) SpO2:  [91 %-98 %] 98 % (10/13 0402) FiO2 (%):  [36 %] 36 % (10/12 2159)     Intake/Output from previous day: 10/12 0701 - 10/13 0700 In: -  Out: 80 [Chest Tube:80] Intake/Output this shift: No intake/output data recorded.  General appearance: alert, cooperative, and no distress Neurologic: intact Heart: RRR Lungs: On RA with no resp distress, stable sats. No air leak today. CXR is stable.  ABD- soft and NT.  Extremities: no edema Wound: port incisions are intact and dry. The chest tube is secure.  Lab Results: Recent Labs    11/04/22 0237 11/05/22 0223  WBC 8.9 8.2  HGB 12.2* 10.4*  HCT 35.2* 30.8*  PLT 194 172   BMET:  Recent Labs    11/05/22 0223 11/06/22 0228  NA 138 136  K 3.3* 4.0  CL 103 102  CO2 28 25  GLUCOSE 123* 100*  BUN 19 12  CREATININE 1.02 0.93  CALCIUM 8.1* 8.0*    PT/INR: No results for input(s): "LABPROT", "INR" in the last 72 hours. ABG No results found for: "PHART", "HCO3", "TCO2", "ACIDBASEDEF", "O2SAT" CBG (last 3)  No results for input(s): "GLUCAP" in the last 72 hours.  Assessment/Plan: S/P Procedure(s) (LRB): XI ROBOTIC ASSISTED THORACOSCOPY-RIGHT UPPER LOBECTOMY (Right) LYMPH NODE BIOPSY (Right) INTERCOSTAL NERVE BLOCK (Right)  -POD3 right robotic-assisted right  upper lobectomy for suspected primary lung cancer. Path pending. No air leak and CXR stable. Will d/c the chest tube today. CXR in AM.  Continue to mobilize and work on [employer].   -H/O HTN- BP reasonably controlled on his Toprol XL.   -RENAL: normal function. K+ corrected  -No BM despite stool softeners and Miralax.  Mag Citrate today.   -DVT PPX- on SubQ enoxaparin daily, ambulate.     LOS: 3 days    Leary Roca, PA-C [phone] 11/06/2022

## 2022-11-06 NOTE — Plan of Care (Signed)

## 2022-11-06 NOTE — Progress Notes (Signed)
Mobility Specialist Progress Note:   11/06/22 0900  Mobility  Activity Ambulated independently in hallway  Level of Assistance Modified independent, requires aide device or extra time  Assistive Device None  Distance Ambulated (ft) 475 ft  Activity Response Tolerated well  Mobility Referral Yes  $Mobility charge 1 Mobility  Mobility Specialist Start Time (ACUTE ONLY) 0845  Mobility Specialist Stop Time (ACUTE ONLY) 0858  Mobility Specialist Time Calculation (min) (ACUTE ONLY) 13 min    Pre Mobility: 72 HR During Mobility: 86 HR Post Mobility:  71 HR  Received pt in bed having no complaints and agreeable to mobility. Pt was asymptomatic throughout ambulation and returned to room w/o fault. Left in bed w/ call bell in reach and all needs met.   Travis Gray Mobility Specialist Please contact via Special educational needs teacher or Rehab office at [phone]

## 2022-11-07 ENCOUNTER — Other Ambulatory Visit (HOSPITAL_COMMUNITY): Payer: Self-pay

## 2022-11-07 ENCOUNTER — Inpatient Hospital Stay (HOSPITAL_COMMUNITY): Payer: Medicare HMO

## 2022-11-07 LAB — SURGICAL PATHOLOGY

## 2022-11-07 MED ORDER — OXYCODONE HCL 5 MG PO TABS
5.0000 mg | ORAL_TABLET | Freq: Four times a day (QID) | ORAL | 0 refills | Status: DC | PRN
  Filled 2022-11-07: qty 28, 7d supply, fill #0

## 2022-11-07 NOTE — Progress Notes (Signed)
      [address]       [place] [address]             [phone]      4 Days Post-Op Procedure(s) (LRB): XI ROBOTIC ASSISTED THORACOSCOPY-RIGHT UPPER LOBECTOMY (Right) LYMPH NODE BIOPSY (Right) INTERCOSTAL NERVE BLOCK (Right) Subjective: Patient states he still has some soreness where the chest tube was and he has some SOB but this is improving.   Objective: Vital signs in last 24 hours: Temp:  [97.8 F (36.6 C)-98.6 F (37 C)] 98 F (36.7 C) (10/14 0343) Pulse Rate:  [20-62] 20 (10/14 0343) Cardiac Rhythm: Sinus bradycardia (10/13 1904) Resp:  [14-20] 14 (10/14 0343) BP: (127-131)/(85-89) 131/89 (10/14 0343) SpO2:  [93 %-99 %] 97 % (10/14 0343)  Hemodynamic parameters for last 24 hours:    Intake/Output from previous day: No intake/output data recorded. Intake/Output this shift: No intake/output data recorded.  General appearance: alert, cooperative, and no distress Neurologic: intact Heart: regular rate and rhythm-sinus bradycardia, no murmur Lungs: clear to auscultation bilaterally Abdomen: soft, non-tender; bowel sounds normal; no masses,  no organomegaly Extremities: extremities normal, atraumatic, no cyanosis or edema Wound: Clean and dry incisions without sign of infection  Lab Results: Recent Labs    11/05/22 0223  WBC 8.2  HGB 10.4*  HCT 30.8*  PLT 172   BMET:  Recent Labs    11/05/22 0223 11/06/22 0228  NA 138 136  K 3.3* 4.0  CL 103 102  CO2 28 25  GLUCOSE 123* 100*  BUN 19 12  CREATININE 1.02 0.93  CALCIUM 8.1* 8.0*    PT/INR: No results for input(s): "LABPROT", "INR" in the last 72 hours. ABG No results found for: "PHART", "HCO3", "TCO2", "ACIDBASEDEF", "O2SAT" CBG (last 3)  No results for input(s): "GLUCAP" in the last 72 hours.  Assessment/Plan: S/P Procedure(s) (LRB): XI ROBOTIC ASSISTED THORACOSCOPY-RIGHT UPPER LOBECTOMY (Right) LYMPH NODE BIOPSY (Right) INTERCOSTAL NERVE BLOCK (Right)  Neuro: Pain  controlled   CV: SB-NSR, HR 50s-60s. On Thursday Amiodarone and Metoprolol were restarted due to possible controlled rate afib, on the monitor it looked like ventricular bigeminy. As discussed with Dr. Cliffton Asters Amiodarone was discontinue and home Toprol XL was continued. On home Toprol XL 25mg  daily, pt states he has a low heart rate at baseline. Pt is asymptomatic. BP overall controlled on Toprol XL will continue to hold Losartan and hydrochlorothiazide until follow up in the office. Pt on Plavix at home, will restart at discharge.   Pulm: Saturating well on RA. Hx of OSA, uses CPAP at night. CT removed yesterday. CXR looks like stable to slightly increased right pneumothorax pending final read. Encourage IS and ambulation  GI: +BM yesterday, good appetite   Endo: No hx of DM   Renal: Cr is at baseline   Expected postop ABLA: H/H 10.4/30.8, not clinically significant at this time   DVT Prophylaxis: Lovenox   Dispo: Likely d/c to home today   LOS: 4 days    [name], PA-C 11/07/2022

## 2022-11-07 NOTE — Plan of Care (Signed)

## 2022-11-07 NOTE — Care Management Important Message (Signed)
Important Message  Patient Details  Name: Travis Gray MRN: [id number] Date of Birth: 1955-02-20   Important Message Given:  Yes - Medicare IM  Patient discharged prior to IM delivery will mail a copy to the patient home address.     Adeja Sarratt 11/07/2022, 3:31 PM

## 2022-11-07 NOTE — Progress Notes (Signed)
Mobility Specialist Progress Note:   11/07/22 1000  Mobility  Activity Ambulated independently in hallway  Level of Assistance Independent after set-up  Assistive Device None  Distance Ambulated (ft) 475 ft  Activity Response Tolerated well  Mobility Referral Yes  $Mobility charge 1 Mobility  Mobility Specialist Start Time (ACUTE ONLY) [phone]  Mobility Specialist Stop Time (ACUTE ONLY) 0847  Mobility Specialist Time Calculation (min) (ACUTE ONLY) 8 min    Pre Mobility: 69 HR During Mobility: 91 HR Post Mobility:  62 HR  Received pt in bed having no complaints and agreeable to mobility. Pt was asymptomatic throughout ambulation and returned to room w/o fault. Left in bed w/ call bell in reach and all needs met.   Travis Gray Mobility Specialist Please contact via Special educational needs teacher or Rehab office at [phone]

## 2022-11-10 ENCOUNTER — Ambulatory Visit (INDEPENDENT_AMBULATORY_CARE_PROVIDER_SITE_OTHER): Payer: Medicare HMO | Admitting: Internal Medicine

## 2022-11-10 ENCOUNTER — Encounter: Payer: Self-pay | Admitting: Internal Medicine

## 2022-11-10 VITALS — BP 132/80 | HR 75 | Temp 98.2°F | Resp 16 | Ht 69.0 in | Wt 170.0 lb

## 2022-11-10 DIAGNOSIS — Z720 Tobacco use: Secondary | ICD-10-CM | POA: Diagnosis not present

## 2022-11-10 DIAGNOSIS — F439 Reaction to severe stress, unspecified: Secondary | ICD-10-CM

## 2022-11-10 DIAGNOSIS — K219 Gastro-esophageal reflux disease without esophagitis: Secondary | ICD-10-CM

## 2022-11-10 DIAGNOSIS — I739 Peripheral vascular disease, unspecified: Secondary | ICD-10-CM

## 2022-11-10 DIAGNOSIS — E7800 Pure hypercholesterolemia, unspecified: Secondary | ICD-10-CM | POA: Diagnosis not present

## 2022-11-10 DIAGNOSIS — I10 Essential (primary) hypertension: Secondary | ICD-10-CM

## 2022-11-10 DIAGNOSIS — R739 Hyperglycemia, unspecified: Secondary | ICD-10-CM

## 2022-11-10 DIAGNOSIS — I2510 Atherosclerotic heart disease of native coronary artery without angina pectoris: Secondary | ICD-10-CM

## 2022-11-10 DIAGNOSIS — I700 Atherosclerosis of aorta: Secondary | ICD-10-CM

## 2022-11-10 DIAGNOSIS — D649 Anemia, unspecified: Secondary | ICD-10-CM | POA: Diagnosis not present

## 2022-11-10 DIAGNOSIS — R911 Solitary pulmonary nodule: Secondary | ICD-10-CM

## 2022-11-10 LAB — BASIC METABOLIC PANEL
BUN: 18 mg/dL (ref 6–23)
CO2: 28 meq/L (ref 19–32)
Calcium: 9.4 mg/dL (ref 8.4–10.5)
Chloride: 101 meq/L (ref 96–112)
Creatinine, Ser: 0.94 mg/dL (ref 0.40–1.50)
GFR: 85.07 mL/min (ref >60.00)
Glucose, Bld: 96 mg/dL (ref 70–99)
Potassium: 4.2 meq/L (ref 3.5–5.1)
Sodium: 138 meq/L (ref 135–145)

## 2022-11-10 LAB — CBC WITH DIFFERENTIAL/PLATELET
Basophils Absolute: 0 10*3/uL (ref 0.0–0.1)
Basophils Relative: 0.5 % (ref 0.0–3.0)
Eosinophils Absolute: 0.2 10*3/uL (ref 0.0–0.7)
Eosinophils Relative: 2.7 % (ref 0.0–5.0)
HCT: 37 % — ABNORMAL LOW (ref 39.0–52.0)
Hemoglobin: 12.3 g/dL — ABNORMAL LOW (ref 13.0–17.0)
Lymphocytes Relative: 22.9 % (ref 12.0–46.0)
Lymphs Abs: 1.8 10*3/uL (ref 0.7–4.0)
MCHC: 33.3 g/dL (ref 30.0–36.0)
MCV: 95.4 fL (ref 78.0–100.0)
Monocytes Absolute: 0.8 10*3/uL (ref 0.1–1.0)
Monocytes Relative: 10.4 % (ref 3.0–12.0)
Neutro Abs: 5 10*3/uL (ref 1.4–7.7)
Neutrophils Relative %: 63.5 % (ref 43.0–77.0)
Platelets: 356 10*3/uL (ref 150.0–400.0)
RBC: 3.88 Mil/uL — ABNORMAL LOW (ref 4.22–5.81)
RDW: 13.3 % (ref 11.5–15.5)
WBC: 7.9 10*3/uL (ref 4.0–10.5)

## 2022-11-10 NOTE — Progress Notes (Signed)
Subjective:    Patient ID: Travis Gray, male    DOB: [birth year], 67 y.o.   MRN: [id number]  Patient here for  Chief Complaint  Patient presents with   Hospitalization Follow-up    HPI Here for hospital follow up. Recently admitted 11/03/22 - 11/07/22 - He underwent robotic assisted right upper lobectomy. It was felt he had controlled rate atrial fibrillation, Amiodarone PO 400mg  BID and Lopressor 12.5mg  BID was started. It was later felt this was NSR with ventricular bigeminy, Amiodarone was discontinued and he was restarted on home Toprol XL 25mg  daily with parameters. Developed a right apical pneumothorax.  S/p chest tube. Third post op day - chest tube removed.  Follow up CXR showed a stable possible tiny right apical pneumothorax. Was discharged on toprol. Hydrochlorothiazide and losartan held.  Has f/u with Dr Cliffton Asters tomorrow.  Is doing much better.  Energy is better.  Breathing overall stable.  No nausea or vomiting.  Bowels moving.    Past Medical History:  Diagnosis Date   Chronic headaches    migraines, 3-4x/yr   Degenerative disc disease, cervical    Genetic testing 03/20/2017   Multi-Cancer panel (83 genes) @ [facility] - No pathogenic mutations detected   GERD (gastroesophageal reflux disease)    Hypercholesterolemia    Hypertension    Myocardial infarction (HCC)    Old Infarct based on Stress test results in 2024   Obesity    Peripheral vascular disease (HCC)    groin and right leg stents   Skin cancer    arm/ face area.  Has had removed   Sleep apnea    CPAP   Tobacco abuse    Past Surgical History:  Procedure Laterality Date   CARDIAC CATHETERIZATION     CATARACT EXTRACTION W/PHACO Left 04/11/2018   Procedure: CATARACT EXTRACTION PHACO AND INTRAOCULAR LENS PLACEMENT (IOC)  COMPLICATED LEFT;  Surgeon: Lockie Mola, MD;  Location: [facility];  Service: Ophthalmology;  Laterality: Left;  OMIDRIA sleep apnea   COLONOSCOPY WITH PROPOFOL N/A  11/25/2016   Procedure: COLONOSCOPY WITH PROPOFOL;  Surgeon: Toledo, Boykin Nearing, MD;  Location: [facility];  Service: Endoscopy;  Laterality: N/A;   ESOPHAGOGASTRODUODENOSCOPY (EGD) WITH PROPOFOL N/A 11/25/2016   Procedure: ESOPHAGOGASTRODUODENOSCOPY (EGD) WITH PROPOFOL;  Surgeon: Toledo, Boykin Nearing, MD;  Location: [facility];  Service: Endoscopy;  Laterality: N/A;   HERNIA REPAIR     Inguinal,laparoscopic surgery   HOLEP-LASER ENUCLEATION OF THE PROSTATE WITH MORCELLATION N/A 03/19/2021   Procedure: HOLEP-LASER ENUCLEATION OF THE PROSTATE WITH MORCELLATION;  Surgeon: Sondra Come, MD;  Location: [facility] OR[facility];  Service: Urology;  Laterality: N/A;   INTERCOSTAL NERVE BLOCK Right 11/03/2022   Procedure: INTERCOSTAL NERVE BLOCK;  Surgeon: [name], MD;  Location: [facility];  Service: Thoracic;  Laterality: Right;   LYMPH NODE BIOPSY Right 11/03/2022   Procedure: LYMPH NODE BIOPSY;  Surgeon: [name], MD;  Location: [facility] OR;  Service: Thoracic;  Laterality: Right;   PERIPHERAL VASCULAR CATHETERIZATION Right 05/21/2015   Procedure: Lower Extremity Angiography;  Surgeon: Annice Needy, MD;  Location: [facility];  Service: Cardiovascular;  Laterality: Right;   PERIPHERAL VASCULAR CATHETERIZATION  05/21/2015   Procedure: Lower Extremity Intervention;  Surgeon: Annice Needy, MD;  Location: [facility];  Service: Cardiovascular;;   radio-ablated therapy     completed in the pain clinic   RETINAL DETACHMENT SURGERY     on right eye x 2   Family History  Problem Relation Age of Onset   Hypertension Mother    Breast cancer Mother 57   Uterine cancer Mother        unconfirmed; dx 98s; TAH/BSO; currently 39   Hyperlipidemia Father    Bladder Cancer Paternal Grandfather 15       smoker; deceased 82   Diabetes Maternal Grandmother    Non-Hodgkin's lymphoma Paternal Aunt        deceased 25   Social History   Socioeconomic History   Marital status: Married     Spouse name: Not on file   Number of children: Not on file   Years of education: Not on file   Highest education level: Not on file  Occupational History   Not on file  Tobacco Use   Smoking status: Former    Current packs/day: 0.00    Average packs/day: 0.5 packs/day for 37.0 years (18.5 ttl pk-yrs)    Types: Cigarettes    Quit date: 09/02/2022    Years since quitting: 0.1    Passive exposure: Past   Smokeless tobacco: Never   Tobacco comments:    1 pack will last 2-3 days--08/15/2022  Vaping Use   Vaping status: Former  Substance and Sexual Activity   Alcohol use: Yes    Alcohol/week: 0.0 standard drinks of alcohol    Comment: very occasional alcohol use - 3-4x/yr   Drug use: No   Sexual activity: Yes  Other Topics Concern   Not on file  Social History Narrative   Not on file   Social Determinants of Health   Financial Resource Strain: Not on file  Food Insecurity: No Food Insecurity (08/24/2021)   Hunger Vital Sign    Worried About Running Out of Food in the Last Year: Never true    Ran Out of Food in the Last Year: Never true  Transportation Needs: No Transportation Needs (08/24/2021)   PRAPARE - Administrator, Civil Service (Medical): No    Lack of Transportation (Non-Medical): No  Physical Activity: Not on file  Stress: Not on file  Social Connections: Not on file     Review of Systems  Constitutional:  Negative for appetite change and unexpected weight change.  HENT:  Negative for congestion and sinus pressure.   Respiratory:  Negative for cough, chest tightness and shortness of breath.   Cardiovascular:  Negative for chest pain and palpitations.  Gastrointestinal:  Negative for abdominal pain, diarrhea, nausea and vomiting.  Genitourinary:  Negative for difficulty urinating and dysuria.  Musculoskeletal:  Negative for joint swelling and myalgias.  Skin:  Negative for color change and rash.  Neurological:  Negative for dizziness and headaches.   Psychiatric/Behavioral:  Negative for agitation and dysphoric mood.        Objective:     BP 132/80   Pulse 75   Temp 98.2 F (36.8 C)   Resp 16   Ht 5\' 9"  (1.753 m)   Wt 170 lb (77.1 kg)   SpO2 97%   BMI 25.10 kg/m  Wt Readings from Last 3 Encounters:  11/11/22 170 lb (77.1 kg)  11/10/22 170 lb (77.1 kg)  11/03/22 170 lb (77.1 kg)    Physical Exam Vitals reviewed.  Constitutional:      General: He is not in acute distress.    Appearance: Normal appearance. He is well-developed.  HENT:     Head: Normocephalic and atraumatic.     Right Ear: External ear normal.     Left  Ear: External ear normal.  Eyes:     General: No scleral icterus.       Right eye: No discharge.        Left eye: No discharge.     Conjunctiva/sclera: Conjunctivae normal.  Cardiovascular:     Rate and Rhythm: Normal rate and regular rhythm.  Pulmonary:     Effort: Pulmonary effort is normal. No respiratory distress.     Breath sounds: Normal breath sounds.  Abdominal:     General: Bowel sounds are normal.     Palpations: Abdomen is soft.     Tenderness: There is no abdominal tenderness.     Comments: Fullness - upper abdomen.    Musculoskeletal:        General: No swelling or tenderness.     Cervical back: Neck supple. No tenderness.  Lymphadenopathy:     Cervical: No cervical adenopathy.  Skin:    Findings: No erythema or rash.     Comments: Resolving hematoma.    Neurological:     Mental Status: He is alert.  Psychiatric:        Mood and Affect: Mood normal.        Behavior: Behavior normal.      Outpatient Encounter Medications as of 11/10/2022  Medication Sig   amitriptyline (ELAVIL) 25 MG tablet TAKE 1 TABLET BY MOUTH AT BEDTIME   atorvastatin (LIPITOR) 20 MG tablet TAKE 1 TABLET BY MOUTH AT BEDTIME   clopidogrel (PLAVIX) 75 MG tablet TAKE 1 TABLET BY MOUTH ONCE DAILY   DULoxetine (CYMBALTA) 30 MG capsule TAKE 1 CAPSULE BY MOUTH ONCE DAILY   metoprolol succinate (TOPROL-XL)  25 MG 24 hr tablet Take 1 tablet (25 mg total) by mouth daily.   Omega-3 Fatty Acids (FISH OIL PO) Take 2 capsules by mouth in the morning.   oxyCODONE (OXY IR/ROXICODONE) 5 MG immediate release tablet Take 1 tablet (5 mg total) by mouth every 6 (six) hours as needed for moderate pain.   pantoprazole (PROTONIX) 40 MG tablet TAKE 1 TABLET BY MOUTH ONCE DAILY   vitamin B-12 (CYANOCOBALAMIN) 1000 MCG tablet Take 1,000 mcg by mouth in the morning.   No facility-administered encounter medications on file as of 11/10/2022.    Reviewed and reconciled medications - with pt.   Lab Results  Component Value Date   WBC 7.9 11/10/2022   HGB 12.3 (L) 11/10/2022   HCT 37.0 (L) 11/10/2022   PLT 356.0 11/10/2022   GLUCOSE 96 11/10/2022   CHOL 162 10/12/2022   TRIG (H) 10/12/2022    466.0 Triglyceride is over 400; calculations on Lipids are invalid.   HDL 27.80 (L) 10/12/2022   LDLDIRECT 60.0 10/12/2022   LDLCALC 60 04/24/2013   ALT 12 11/05/2022   AST 22 11/05/2022   NA 138 11/10/2022   K 4.2 11/10/2022   CL 101 11/10/2022   CREATININE 0.94 11/10/2022   BUN 18 11/10/2022   CO2 28 11/10/2022   TSH 3.28 06/13/2022   PSA 0.18 06/13/2022   INR 1.0 11/01/2022   HGBA1C 5.8 10/12/2022    DG CHEST PORT 1 VIEW  Result Date: 11/04/2022 CLINICAL DATA:  660630 Pneumothorax, right 160109. Chest tube clamped. EXAM: PORTABLE CHEST 1 VIEW COMPARISON:  Chest radiograph 11/04/2022 at 0512 hours. FINDINGS: 1203 hours. Unchanged right apically directed chest tube. Recurrent right apical pneumothorax. Otherwise stable postoperative changes from right chest surgery with subcutaneous emphysema along the right lower chest wall. IMPRESSION: Recurrent right apical pneumothorax. Electronically Signed   By:  Orvan Falconer [name]   On: 11/04/2022 16:02   DG Chest Port 1 View  Result Date: 11/04/2022 CLINICAL DATA:  Status post lobectomy of lung. EXAM: PORTABLE CHEST 1 VIEW COMPARISON:  Radiograph yesterday FINDINGS:  Postsurgical volume loss in the right hemithorax with chain sutures at the hilum. Right-sided chest tube directed towards the apex. No pneumothorax. Improving right basilar and left lung opacities. The heart is normal in size with normal mediastinal contours. No significant pleural effusion small amount of subcutaneous emphysema is seen in the right lateral chest wall. IMPRESSION: 1. Postsurgical volume loss in the right hemithorax. Right-sided chest tube in place. No pneumothorax. 2. Improving right basilar and left lung opacities. Electronically Signed   By: Narda Rutherford [name]   On: 11/04/2022 10:14   DG Chest Port 1 View  Result Date: 11/03/2022 CLINICAL DATA:  [id number] S/P lobectomy of lung [id number] EXAM: PORTABLE CHEST 1 VIEW COMPARISON:  Chest x-ray 11/01/2022, PET CT 08/23/2022 FINDINGS: Right chest tube with tip overlying the right apex. The heart and mediastinal contours are unchanged. Peripheral right lower lobe airspace opacity. Right lung suture staples noted consistent with history of lobectomy. Left lung diffuse hazy airspace opacity. Chronic coarsened markings with no overt pulmonary edema. No pleural effusion. No pneumothorax. No acute osseous abnormality. IMPRESSION: 1. Peripheral right lower lobe airspace opacity. Left lung diffuse hazy airspace opacity. Finding likely represents atelectasis in setting of postsurgical changes. Recommend attention on follow-up. 2.  Emphysema (ICD10-J43.9). Electronically Signed   By: Tish Frederickson [name]   On: 11/03/2022 19:37       Assessment & Plan:  Hypercholesterolemia Assessment & Plan: Continue lipitor.  Low cholesterol diet and exercise as tolerated.  Improved.  Lab Results  Component Value Date   CHOL 162 10/12/2022   HDL 27.80 (L) 10/12/2022   LDLCALC 60 04/24/2013   LDLDIRECT 60.0 10/12/2022   TRIG (H) 10/12/2022    466.0 Triglyceride is over 400; calculations on Lipids are invalid.   CHOLHDL 6 10/12/2022     Essential hypertension,  benign Assessment & Plan: Only on toprol now.  Discussed current blood pressure.  Will check blood pressure this pm and tomorrow.  Call in readings.  Discussed adding back losartan if blood pressure remains elevated. Check metabolic panel today.   Orders: -     Basic metabolic panel  Anemia, unspecified type Assessment & Plan: Hgb decreased after surgery.  Recheck cbc today.   Orders: -     CBC with Differential/Platelet  Tobacco abuse Assessment & Plan: Has stopped smoking. Follow.    Stress Assessment & Plan: Overall appears to be handling things well.  Continue cymbalta.  Follow.    Peripheral vascular disease [facility]) Assessment & Plan: Saw Dr Wyn Quaker 02/04/22 - f/u PAD. Is status post bilateral iliac stent placement as well as right infrainguinal revascularization. Recommended to continue plavix and lipitor.  F/u one year.   Lung nodule Assessment & Plan: 11/03/22 - Robotic assisted right video thoracoscopy. Right upper lobectomy. Mediastinal lymph node sampling. Intercostal nerve block    Hyperglycemia Assessment & Plan: Low carb diet and exercise.  Follow met b and a1c.    Gastroesophageal reflux disease, unspecified whether esophagitis present Assessment & Plan: No upper symptoms reported.  On protonix.    Coronary artery disease involving native coronary artery of native heart without angina pectoris Assessment & Plan: Coronary calcium score of 61. This was 47th percentile for age, sex, and race matched control. 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 There is a retro-aortic course with only one high risk feature: Acute angle take off. CAD-RADS 2. Mild non-obstructive CAD (25-49%). Consider non-atherosclerotic causes of chest pain. Continue risk factor modification.    Aortic atherosclerosis (HCC) Assessment & Plan: Continue lipitor.       Dale [place], MD

## 2022-11-11 ENCOUNTER — Encounter: Payer: Self-pay | Admitting: Thoracic Surgery (Cardiothoracic Vascular Surgery)

## 2022-11-11 ENCOUNTER — Ambulatory Visit (INDEPENDENT_AMBULATORY_CARE_PROVIDER_SITE_OTHER): Payer: Self-pay | Admitting: Thoracic Surgery (Cardiothoracic Vascular Surgery)

## 2022-11-11 VITALS — BP 151/96 | HR 89 | Resp 20 | Ht 69.0 in | Wt 170.0 lb

## 2022-11-11 DIAGNOSIS — R911 Solitary pulmonary nodule: Secondary | ICD-10-CM

## 2022-11-11 NOTE — Progress Notes (Signed)
[address]       [place] [address]             [phone]        Shanne Hams [facility] Medical Record #[id number] Date of Birth: 1955/06/22  Referring: Raechel Chute, MD Primary Care: Dale Fisher, MD Primary Cardiologist:None  Reason for visit:   follow-up  History of Present Illness:     67yo male presents for their first follow-up appointment.  Overall, he is doing well.  He is not using much pain medication  Physical Exam: BP (!) 151/96 (BP Location: Left Arm, Patient Position: Sitting, Cuff Size: Normal)   Pulse 89   Resp 20   Ht 5\' 9"  (1.753 m)   Wt 170 lb (77.1 kg)   SpO2 99% Comment: RA  BMI 25.10 kg/m   Alert NAD Incision clean.   Abdomen, ND no peripheral edema   Diagnostic Studies & Laboratory data:  Path:   SURGICAL PATHOLOGY CASE: [id number] PATIENT: Travis Gray Surgical Pathology Report     Clinical History: right upper lobe pulmonary nodule (cm)     FINAL MICROSCOPIC DIAGNOSIS:  A.LYMPH NODE, LEVEL 7, EXCISION:      One lymph node, negative for metastatic carcinoma (0/1).  B. LYMPH NODE, HILAR, EXCISION:      One lymph node, negative for metastatic carcinoma (0/1).  C. LYMPH NODE, LEVEL 4, EXCISION:      One lymph node, negative for metastatic carcinoma (0/1).  D. LUNG, RIGHT UPPER LOBE, LOBECTOMY:      Organizing pneumonia with chronic inflammation, elastosis, fibrosis and calcifications.      Focus of necrotic nodule with chronic inflammation and calcifications. 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      Three lymph nodes, negative for metastatic carcinoma (0/3).      Assessment / Plan:   67yo male s/p right upper lobectomy.  Pathology was negative.  I explained to the family that given the location of the 2 nodule, a lobectomy was required for diagnosis.  Fortunately, the pathology was negative for malignancy, but there is a  background of interstitial lung disease.  He is currently being seen by Dr. Aundria Rud, and I will update him on the results.  I have made an appointment  for a 1 month follow-up with a CXR.     [name] 11/11/2022 1:29 PM

## 2022-11-13 ENCOUNTER — Encounter: Payer: Self-pay | Admitting: Internal Medicine

## 2022-11-13 NOTE — Assessment & Plan Note (Signed)
Has stopped smoking.  Follow.  

## 2022-11-13 NOTE — Assessment & Plan Note (Signed)
11/03/22 - Robotic assisted right video thoracoscopy. Right upper lobectomy. Mediastinal lymph node sampling. Intercostal nerve block

## 2022-11-13 NOTE — Assessment & Plan Note (Signed)
Low carb diet and exercise.  Follow met b and a1c.  

## 2022-11-13 NOTE — Assessment & Plan Note (Signed)
Continue lipitor.  Low cholesterol diet and exercise as tolerated.  Improved.  Lab Results  Component Value Date   CHOL 162 10/12/2022   HDL 27.80 (L) 10/12/2022   LDLCALC 60 04/24/2013   LDLDIRECT 60.0 10/12/2022   TRIG (H) 10/12/2022    466.0 Triglyceride is over 400; calculations on Lipids are invalid.   CHOLHDL 6 10/12/2022

## 2022-11-13 NOTE — Assessment & Plan Note (Signed)
Only on toprol now.  Discussed current blood pressure.  Will check blood pressure this pm and tomorrow.  Call in readings.  Discussed adding back losartan if blood pressure remains elevated. Check metabolic panel today.

## 2022-11-13 NOTE — Assessment & Plan Note (Signed)
No upper symptoms reported.  On protonix.   

## 2022-11-13 NOTE — Assessment & Plan Note (Signed)
Continue lipitor  ?

## 2022-11-13 NOTE — Assessment & Plan Note (Signed)
Overall appears to be handling things well.  Continue cymbalta.  Follow.

## 2022-11-13 NOTE — Assessment & Plan Note (Signed)
Coronary calcium score of 61. This was 47th percentile for age, sex, and race matched control. 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 There is a retro-aortic course with only one high risk feature: Acute angle take off. CAD-RADS 2. Mild non-obstructive CAD (25-49%). Consider non-atherosclerotic causes of chest pain. Continue risk factor modification.

## 2022-11-13 NOTE — Assessment & Plan Note (Signed)
Saw Dr Wyn Quaker 02/04/22 - f/u PAD. Is status post bilateral iliac stent placement as well as right infrainguinal revascularization. Recommended to continue plavix and lipitor.  F/u one year.

## 2022-11-13 NOTE — Assessment & Plan Note (Signed)
Hgb decreased after surgery.  Recheck cbc today.

## 2022-11-15 ENCOUNTER — Other Ambulatory Visit: Payer: Self-pay | Admitting: Internal Medicine

## 2022-11-15 ENCOUNTER — Telehealth: Payer: Self-pay | Admitting: Pulmonary Disease

## 2022-11-15 ENCOUNTER — Ambulatory Visit: Payer: Medicare HMO | Admitting: Pulmonary Disease

## 2022-11-15 ENCOUNTER — Encounter: Payer: Self-pay | Admitting: Student in an Organized Health Care Education/Training Program

## 2022-11-15 VITALS — BP 120/84 | HR 63 | Temp 97.8°F | Ht 69.0 in | Wt 166.8 lb

## 2022-11-15 DIAGNOSIS — Z87891 Personal history of nicotine dependence: Secondary | ICD-10-CM | POA: Diagnosis not present

## 2022-11-15 DIAGNOSIS — J449 Chronic obstructive pulmonary disease, unspecified: Secondary | ICD-10-CM | POA: Diagnosis not present

## 2022-11-15 DIAGNOSIS — J84115 Respiratory bronchiolitis interstitial lung disease: Secondary | ICD-10-CM

## 2022-11-15 DIAGNOSIS — J8489 Other specified interstitial pulmonary diseases: Secondary | ICD-10-CM | POA: Diagnosis not present

## 2022-11-15 MED ORDER — TRELEGY ELLIPTA 100-62.5-25 MCG/ACT IN AEPB
1.0000 | INHALATION_SPRAY | Freq: Every day | RESPIRATORY_TRACT | 11 refills | Status: DC
Start: 2022-11-15 — End: 2022-11-15

## 2022-11-15 MED ORDER — ALBUTEROL SULFATE HFA 108 (90 BASE) MCG/ACT IN AERS: 2.0000 | INHALATION_SPRAY | Freq: Four times a day (QID) | RESPIRATORY_TRACT | 2 refills | Status: DC | PRN

## 2022-11-15 MED ORDER — TRELEGY ELLIPTA 100-62.5-25 MCG/ACT IN AEPB: 1.0000 | INHALATION_SPRAY | Freq: Every day | RESPIRATORY_TRACT | 11 refills | Status: AC

## 2022-11-15 NOTE — Telephone Encounter (Signed)
Prescription was changed and re-sent to the pharmacy.   Nothing further needed.

## 2022-11-15 NOTE — Patient Instructions (Signed)
VISIT SUMMARY:  Dear Travis Gray, during your recent visit, we discussed your recovery from the lung nodule excision surgery performed by Dr. Cliffton Asters. The surgery revealed you have smoker's bronchiolitis and organizing pneumonia, both conditions related to chronic inflammation in your lungs. You also have Chronic Obstructive Pulmonary Disease (COPD) with an asthma component. We also noted some scar tissue in your lungs, likely due to the inflammation.  YOUR PLAN:  -POST-OPERATIVE RECOVERY: You are recovering well from your surgery. It's important to continue not smoking to help your lungs heal and prevent further damage.  -COPD WITH ASTHMA COMPONENT: This means you have a lung disease that makes it hard to breathe and it's partially caused by asthma. We're starting you on a daily Trelegy inhaler and providing a rescue inhaler for use as needed.  -GENERAL HEALTH MAINTENANCE: You declined the influenza vaccination. It's important to consider getting vaccinated to protect your health, especially considering your lung conditions.  INSTRUCTIONS:  Please start using the Trelegy inhaler once daily and rinse your mouth after each use. Use the rescue inhaler as needed. Remember to continue not smoking. We will follow up in 3 months with Dr. Rock Nephew.

## 2022-11-15 NOTE — Progress Notes (Signed)
Subjective:    Patient ID: Travis Gray, male    DOB: July 18, 1955, 66 y.o.   MRN: [id number]  Patient Care Team: Dale St. Paul, MD as PCP - General (Internal Medicine) Otho Ket, RN as [facility] Management  Chief Complaint  Patient presents with   Follow-up    No cough, shortness of breath or wheezing.    BACKGROUND/INTERVAL: Patient is a 67 year old recent former smoker patient of Dr. Aundria Rud, who presents for follow-up after right upper lobectomy on 03 November 2022 for a right upper lobe enlarging nodule.  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  HPI Discussed the use of AI scribe software for clinical note transcription with the patient, who gave verbal consent to proceed.  History of Present Illness   Travis Gray, a 67 year old gentleman with a history of smoking, recently underwent excision of a lung nodule by Dr. Cliffton Asters. The surgery occurred approximately a week ago, and the patient has since ceased smoking. The results of the surgery revealed a condition known as RBILD, or smoker's bronchiolitis, and organizing pneumonia, both indicative of chronic inflammation.  The patient reports mild shortness of breath post-surgery but no other significant respiratory symptoms. He is currently retired due to back issues but remains active outdoors as much as his back allows. Since the surgery, he has been adhering to the recommended lifting restrictions.  In addition to the lung conditions, the patient's breathing test results indicate a combination of COPD and an asthma component. The patient has not been using a rescue inhaler.  The patient also has some scar tissue in the lungs, likely resulting from the inflammatory process. Despite the damage, the patient's lungs sound clear upon examination.      DATA/EVENTS: 08/25/2022 PFTs: FEV1 1.90 L or 55% predicted, FVC 3.56 L or 77% predicted, FEV1/FVC 53%, there is a very  significant bronchodilator response with FEV1 improving to 2.43 L or 70% predicted post bronchodilator 4 and at change of 28%.  Total lung capacity 117%, RV 870% indicating air trapping.  Diffusion capacity moderately reduced.  Consistent with moderate obstructive airways disease with reversible component and diffusion defect.  11/03/2022 robotic assisted left upper lobectomy pathology: NO MALIGNANCY.  Findings consistent with ILD, organizing pneumonia and necrotic nodule.    Review of Systems A 10 point review of systems was performed and it is as noted above otherwise negative.   Patient Active Problem List   Diagnosis Date Noted   CAD (coronary artery disease) 09/23/2022   Nasal congestion 06/19/2022   Premature beats 06/06/2022   BPH (benign prostatic hyperplasia) 03/30/2022   Fall 05/14/2021   Low back pain 02/10/2021   Lung nodule 11/07/2020   Bradycardia 11/07/2020   Cerumen impaction 05/03/2020   Hyperglycemia 11/10/2019   Obesity    History of COVID-19 10/09/2019   Stress 08/26/2019   Chronic prostatitis 01/19/2019   Right leg pain 02/18/2018   Degeneration of cervical intervertebral disc 01/31/2018   Aortic atherosclerosis (HCC) 10/29/2017   Vision changes 10/29/2017   Genetic testing 03/20/2017   Sebaceous adenoma 03/06/2017   Sleep apnea 02/27/2017   H/O hernia repair 02/27/2017   Head injury 02/27/2017   Cataract 02/27/2017   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   Carpal tunnel syndrome of right wrist 02/14/2017   Right arm numbness 01/23/2017   Abdominal pain 08/22/2016   GERD (gastroesophageal reflux disease) 08/22/2016   Anemia 01/21/2016   Peripheral vascular disease (HCC) 05/03/2015   Scalp lesion 08/28/2014  Chest pain 04/27/2014   Right hip pain 04/27/2014   Health care maintenance 04/27/2014   Sinusitis 03/23/2014   Numbness and tingling in both hands 03/23/2014   Muscle cramps 03/23/2014   Back pain 03/23/2014   Personal history of other malignant  neoplasm of skin 11/11/2013   Left elbow pain 06/18/2013   Right elbow pain 12/02/2012   History of colonic polyps 03/07/2012   Tobacco abuse 03/07/2012   Hypercholesterolemia 03/02/2012   Headache 03/02/2012   Essential hypertension, benign 03/02/2012    Social History   Tobacco Use   Smoking status: Former    Current packs/day: 0.00    Average packs/day: 0.5 packs/day for 37.0 years (18.5 ttl pk-yrs)    Types: Cigarettes    Quit date: 09/02/2022    Years since quitting: 0.2    Passive exposure: Past   Smokeless tobacco: Never   Tobacco comments:    1 pack will last 2-3 days--08/15/2022  Substance Use Topics   Alcohol use: Yes    Alcohol/week: 0.0 standard drinks of alcohol    Comment: very occasional alcohol use - 3-4x/yr    No Known Allergies  Current Meds  Medication Sig   amitriptyline (ELAVIL) 25 MG tablet TAKE 1 TABLET BY MOUTH AT BEDTIME   atorvastatin (LIPITOR) 20 MG tablet TAKE 1 TABLET BY MOUTH AT BEDTIME   clopidogrel (PLAVIX) 75 MG tablet TAKE 1 TABLET BY MOUTH ONCE DAILY   DULoxetine (CYMBALTA) 30 MG capsule TAKE 1 CAPSULE BY MOUTH ONCE DAILY   losartan (COZAAR) 100 MG tablet Take 100 mg by mouth daily.   metoprolol succinate (TOPROL-XL) 25 MG 24 hr tablet Take 1 tablet (25 mg total) by mouth daily.   Omega-3 Fatty Acids (FISH OIL PO) Take 2 capsules by mouth in the morning.   oxyCODONE (OXY IR/ROXICODONE) 5 MG immediate release tablet Take 1 tablet (5 mg total) by mouth every 6 (six) hours as needed for moderate pain.   pantoprazole (PROTONIX) 40 MG tablet TAKE 1 TABLET BY MOUTH ONCE DAILY   vitamin B-12 (CYANOCOBALAMIN) 1000 MCG tablet Take 1,000 mcg by mouth in the morning.    Immunization History  Administered Date(s) Administered   Influenza,inj,Quad PF,6+ Mos 10/26/2017      Objective:    BP 120/84 (BP Location: Left Arm, Patient Position: Sitting, Cuff Size: Normal)   Pulse 63   Temp 97.8 F (36.6 C) (Temporal)   Ht 5\' 9"  (1.753 m)   Wt 166  lb 12.8 oz (75.7 kg)   SpO2 97%   BMI 24.63 kg/m   SpO2: 97 %  GENERAL: Developed, well-nourished gentleman, no acute distress.  Fully ambulatory.  No conversational dyspnea. HEAD: Normocephalic, atraumatic.  EYES: Pupils equal, round, reactive to light.  No scleral icterus.  MOUTH: Dentition intact, oral mucosa moist.  No thrush. NECK: Supple. No thyromegaly. Trachea midline. No JVD.  No adenopathy. PULMONARY: Good air entry bilaterally.  Worse, otherwise, no adventitious sounds. CARDIOVASCULAR: S1 and S2. Regular rate and rhythm.  No rubs, murmurs or gallops heard. ABDOMEN: Benign. MUSCULOSKELETAL: No joint deformity, no clubbing, no edema.  NEUROLOGIC: No overt focal deficit, no gait disturbance, speech is fluent. SKIN: Intact,warm,dry.   PSYCH: Mood and behavior normal.  Assessment & Plan:     ICD-10-CM   1. Stage 2 moderate COPD by GOLD classification (HCC)  J44.9    Emphysema/reversible airways component    2. Respiratory bronchiolitis interstitial lung disease (HCC)  J84.115     3. Organizing pneumonia (HCC)  J84.89     4. Former smoker  Z87.891    No evidence of relapse     Meds ordered this encounter  Medications   Fluticasone-Umeclidin-Vilant (TRELEGY ELLIPTA) 100-62.5-25 MCG/ACT AEPB    Sig: Inhale 1 puff into the lungs daily.    Dispense:  28 each    Refill:  11   albuterol (VENTOLIN HFA) 108 (90 Base) MCG/ACT inhaler    Sig: Inhale 2 puffs into the lungs every 6 (six) hours as needed for wheezing or shortness of breath.    Dispense:  8 g    Refill:  2   Smoking cessation instruction/counseling given:  commended patient for quitting and reviewed strategies for preventing relapses.  Assessment and Plan    Post-operative follow-up after lung nodule excision Surgery performed by Dr. Cliffton Asters one week ago. Pathology revealed smoker's bronchiolitis (RBILD) and organizing pneumonia. No evidence of malignancy. -Continue smoking cessation.  Chronic  Obstructive Pulmonary Disease (COPD) with Reversible Component Noted improvement in pulmonary function tests after bronchodilator administration. -Start Trelegy inhaler once daily, ensuring to rinse mouth after use. -Provide rescue inhaler for use as needed.  General Health Maintenance -Declined influenza vaccination.  Follow-up in 3 months with Dr.Dgayli.  Call sooner should any new difficulties arise or if he has any issues procuring his inhalers.    Gailen Shelter, MD Advanced Bronchoscopy PCCM [facility]    *This note was dictated using voice recognition software/Dragon.  Despite best efforts to proofread, errors can occur which can change the meaning. Any transcriptional errors that result from this process are unintentional and may not be fully corrected at the time of dictation.

## 2022-11-15 NOTE — Telephone Encounter (Signed)
The trelegy needs to be sent with the quantity as 60 and not 28.

## 2022-11-16 ENCOUNTER — Encounter: Payer: Self-pay | Admitting: Internal Medicine

## 2022-11-16 DIAGNOSIS — J4489 Other specified chronic obstructive pulmonary disease: Secondary | ICD-10-CM | POA: Insufficient documentation

## 2022-11-16 DIAGNOSIS — J449 Chronic obstructive pulmonary disease, unspecified: Secondary | ICD-10-CM | POA: Insufficient documentation

## 2022-11-23 ENCOUNTER — Other Ambulatory Visit: Payer: Self-pay | Admitting: Internal Medicine

## 2022-11-29 ENCOUNTER — Telehealth: Payer: Self-pay | Admitting: Internal Medicine

## 2022-11-29 NOTE — Telephone Encounter (Signed)
Form given to wife

## 2022-11-29 NOTE — Telephone Encounter (Signed)
Patient's wife came in asking if her husbands handicap form was ready for pick up. I looked in the colorful folder, but it wasn't in there. She said they brought it up here 2 weeks ago. His number is [phone].

## 2022-12-12 ENCOUNTER — Other Ambulatory Visit: Payer: Self-pay | Admitting: Internal Medicine

## 2022-12-12 ENCOUNTER — Other Ambulatory Visit: Payer: Self-pay | Admitting: Thoracic Surgery (Cardiothoracic Vascular Surgery)

## 2022-12-12 DIAGNOSIS — R911 Solitary pulmonary nodule: Secondary | ICD-10-CM

## 2022-12-13 ENCOUNTER — Encounter: Payer: Self-pay | Admitting: Physician Assistant

## 2022-12-13 ENCOUNTER — Ambulatory Visit (INDEPENDENT_AMBULATORY_CARE_PROVIDER_SITE_OTHER): Payer: Self-pay | Admitting: Physician Assistant

## 2022-12-13 ENCOUNTER — Ambulatory Visit
Admission: RE | Admit: 2022-12-13 | Discharge: 2022-12-13 | Disposition: A | Discharge status: Home / Self Care | Payer: Medicare HMO | Source: Ambulatory Visit | Attending: Thoracic Surgery (Cardiothoracic Vascular Surgery)

## 2022-12-13 VITALS — BP 131/86 | HR 63 | Resp 18 | Ht 69.0 in | Wt 170.0 lb

## 2022-12-13 DIAGNOSIS — Z902 Acquired absence of lung [part of]: Secondary | ICD-10-CM | POA: Diagnosis not present

## 2022-12-13 DIAGNOSIS — R911 Solitary pulmonary nodule: Secondary | ICD-10-CM

## 2022-12-13 MED ORDER — GABAPENTIN 300 MG PO CAPS: 300.0000 mg | ORAL_CAPSULE | Freq: Every day | ORAL | 1 refills | Status: DC

## 2022-12-13 NOTE — Patient Instructions (Signed)
You may return to normal activity as tolerated on 11/21

## 2022-12-13 NOTE — Progress Notes (Signed)
[address]       [name] [address]             [phone]       HPI: Mr. Pott is a 67 year old male who returns for routine postoperative follow-up having undergone robotic assisted right upper lobectomy by Dr. Cliffton Asters on 11/03/22. Pathology showed organizing pneumonia with chronic inflammation but negative for malignancy. There is a background of interstitial lung disease.  The patient's early postoperative recovery while in the hospital was notable for ventricular bigeminy that improved with his home Toprol XL and slight increase of pneumothorax with clamping trial that resolved with time. The patient was discharged home in stable condition on 11/07/22. He saw Dr. Cliffton Asters on 10/18 and was reported to be doing well. Since hospital discharge the patient reports continued incisional pain and burning of his right chest as well as stable swelling of his right abdomen. He is only using Tylenol at the moment. The patient states he showed the swelling to Dr. Cliffton Asters and was told this was normal. He does admit to some dyspnea with exertion but this is improving with time. He is sleeping well and his appetite is back to normal. He is able to ambulate well without difficulty. He denies dizziness or loss of consciousness.   Current Outpatient Medications  Medication Sig Dispense Refill   albuterol (VENTOLIN HFA) 108 (90 Base) MCG/ACT inhaler Inhale 2 puffs into the lungs every 6 (six) hours as needed for wheezing or shortness of breath. 8 g 2   amitriptyline (ELAVIL) 25 MG tablet TAKE 1 TABLET BY MOUTH AT BEDTIME 90 tablet 1   atorvastatin (LIPITOR) 20 MG tablet TAKE 1 TABLET BY MOUTH AT BEDTIME 90 tablet 1   clopidogrel (PLAVIX) 75 MG tablet TAKE 1 TABLET BY MOUTH ONCE DAILY 30 tablet 11   DULoxetine (CYMBALTA) 30 MG capsule TAKE 1 CAPSULE BY MOUTH ONCE DAILY 90 capsule 1   Fluticasone-Umeclidin-Vilant (TRELEGY ELLIPTA) 100-62.5-25 MCG/ACT AEPB Inhale 1 puff into the  lungs daily. 60 each 11   losartan (COZAAR) 100 MG tablet TAKE 1 TABLET BY MOUTH ONCE DAILY 90 tablet 1   metoprolol succinate (TOPROL-XL) 25 MG 24 hr tablet TAKE 1 TABLET BY MOUTH ONCE DAILY 90 tablet 3   Omega-3 Fatty Acids (FISH OIL PO) Take 2 capsules by mouth in the morning.     oxyCODONE (OXY IR/ROXICODONE) 5 MG immediate release tablet Take 1 tablet (5 mg total) by mouth every 6 (six) hours as needed for moderate pain. 28 tablet 0   pantoprazole (PROTONIX) 40 MG tablet TAKE 1 TABLET BY MOUTH ONCE DAILY 90 tablet 1   vitamin B-12 (CYANOCOBALAMIN) 1000 MCG tablet Take 1,000 mcg by mouth in the morning.     No current facility-administered medications for this visit.   Vitals: Today's Vitals   12/13/22 1440  BP: 131/86  Pulse: 63  Resp: 18  SpO2: 96%  Weight: 170 lb (77.1 kg)  Height: 5\' 9"  (1.753 m)   Body mass index is 25.1 kg/m.  Physical Exam: General: Alert and oriented, no acute distress Neuro: Grossly intact CV: Regular rate and rhythm, no murmur Pulm: Clear to auscultation bilaterally GI: +BS, nontender Extremities: No edema of BLE Wound: Healing well without sign of infection. No sign of rash at right sided chest where he has burning. Swelling of upper right abdomen near his incisions  Diagnostic Tests: CXR read pending but no clear pneumothorax or pleural effusion seen  Impression/Plan: S/P right upper lobectomy: The patient is progressing veryt well from surgery. He does admit to continued incisional pain and burning that does not improve with Tylenol. Likely neuropathic pain. Will try Gabapentin, the patient wanted to start once daily. He is ambulating well and his dyspnea with exertion continues to improve. His incisions are healing well without sign of infection and his swelling near the incisions has remained stable since surgery. Dr. Cliffton Asters is aware of this and told the patient the was normal, should improve with time. Discussed using heat on the area if  that helps with discomfort. The patient had no malignancy on pathology so he will continue to follow up with Dr. Aundria Rud and has an appointment scheduled with [facility] on 01/20. We discussed returning to normal activity without restrictions at 6 weeks (11/21) as tolerated. Will plan to have the patient return to the clinic as needed.   [name], PA-C [facility] [phone]

## 2023-01-20 ENCOUNTER — Other Ambulatory Visit: Payer: Self-pay | Admitting: Internal Medicine

## 2023-01-27 ENCOUNTER — Other Ambulatory Visit: Payer: Self-pay | Admitting: Physician Assistant

## 2023-02-06 ENCOUNTER — Other Ambulatory Visit (INDEPENDENT_AMBULATORY_CARE_PROVIDER_SITE_OTHER): Payer: Self-pay | Admitting: Vascular Surgery

## 2023-02-06 DIAGNOSIS — I739 Peripheral vascular disease, unspecified: Secondary | ICD-10-CM

## 2023-02-07 ENCOUNTER — Encounter (INDEPENDENT_AMBULATORY_CARE_PROVIDER_SITE_OTHER): Payer: Self-pay | Admitting: Vascular Surgery

## 2023-02-07 ENCOUNTER — Ambulatory Visit (INDEPENDENT_AMBULATORY_CARE_PROVIDER_SITE_OTHER): Payer: HMO | Admitting: Vascular Surgery

## 2023-02-07 ENCOUNTER — Ambulatory Visit (INDEPENDENT_AMBULATORY_CARE_PROVIDER_SITE_OTHER): Payer: HMO

## 2023-02-07 VITALS — BP 156/99 | HR 55 | Resp 18 | Ht 69.0 in | Wt 174.8 lb

## 2023-02-07 DIAGNOSIS — E7800 Pure hypercholesterolemia, unspecified: Secondary | ICD-10-CM | POA: Diagnosis not present

## 2023-02-07 DIAGNOSIS — I739 Peripheral vascular disease, unspecified: Secondary | ICD-10-CM

## 2023-02-07 DIAGNOSIS — I10 Essential (primary) hypertension: Secondary | ICD-10-CM | POA: Diagnosis not present

## 2023-02-07 NOTE — Assessment & Plan Note (Signed)
 His ABIs today are 1.01 on the right and 1.07 on the left with triphasic waveforms and normal digital pressures bilaterally.  Doing well after revascularization several years ago.  Continue Plavix and statin agent.  Recheck on an annual basis.

## 2023-02-07 NOTE — Progress Notes (Signed)
 MRN : [id number]  Travis Gray is a 68 y.o. (1955-03-27) male who presents with chief complaint of  Chief Complaint  Patient presents with   Follow-up    RM 1 1 year follow up with abi  .  History of Present Illness: Patient returns today in follow up of PAD.  He is 7 to 8 years status post significant lower extremity revascularization.  He is doing well.  He denies any current lifestyle limiting claudication, ischemic rest pain, or ulceration.  He has lost some weight as he had to have lung surgery last year.  It sounds like he had a right sided lobectomy for a mass.  Fortunately, the pathology came back benign.  His ABIs today are 1.01 on the right and 1.07 on the left with triphasic waveforms and normal digital pressures bilaterally.  Current Outpatient Medications  Medication Sig Dispense Refill   albuterol  (VENTOLIN  HFA) 108 (90 Base) MCG/ACT inhaler Inhale 2 puffs into the lungs every 6 (six) hours as needed for wheezing or shortness of breath. 8 g 2   amitriptyline  (ELAVIL ) 25 MG tablet TAKE 1 TABLET BY MOUTH AT BEDTIME 90 tablet 1   atorvastatin  (LIPITOR) 20 MG tablet TAKE 1 TABLET BY MOUTH AT BEDTIME 90 tablet 1   clopidogrel  (PLAVIX ) 75 MG tablet TAKE 1 TABLET BY MOUTH ONCE DAILY 30 tablet 11   DULoxetine  (CYMBALTA ) 30 MG capsule TAKE 1 CAPSULE BY MOUTH ONCE DAILY 90 capsule 1   Fluticasone-Umeclidin-Vilant (TRELEGY ELLIPTA ) 100-62.5-25 MCG/ACT AEPB Inhale 1 puff into the lungs daily. 60 each 11   gabapentin  (NEURONTIN ) 300 MG capsule Take 1 capsule (300 mg total) by mouth daily. 30 capsule 1   losartan  (COZAAR ) 100 MG tablet TAKE 1 TABLET BY MOUTH ONCE DAILY 90 tablet 1   metoprolol  succinate (TOPROL -XL) 25 MG 24 hr tablet TAKE 1 TABLET BY MOUTH ONCE DAILY 90 tablet 3   Omega-3 Fatty Acids (FISH OIL PO) Take 2 capsules by mouth in the morning.     oxyCODONE  (OXY IR/ROXICODONE ) 5 MG immediate release tablet Take 1 tablet (5 mg total) by mouth every 6 (six) hours as needed for  moderate pain. 28 tablet 0   pantoprazole  (PROTONIX ) 40 MG tablet TAKE 1 TABLET BY MOUTH ONCE DAILY 90 tablet 1   vitamin B-12 (CYANOCOBALAMIN ) 1000 MCG tablet Take 1,000 mcg by mouth in the morning.     No current facility-administered medications for this visit.    Past Medical History:  Diagnosis Date   Chronic headaches    migraines, 3-4x/yr   Degenerative disc disease, cervical    Genetic testing 03/20/2017   Multi-Cancer panel (83 genes) @ [facility] - No pathogenic mutations detected   GERD (gastroesophageal reflux disease)    Hypercholesterolemia    Hypertension    Myocardial infarction (HCC)    Old Infarct based on Stress test results in 2024   Obesity    Peripheral vascular disease (HCC)    groin and right leg stents   Skin cancer    arm/ face area.  Has had removed   Sleep apnea    CPAP   Tobacco abuse     Past Surgical History:  Procedure Laterality Date   CARDIAC CATHETERIZATION     CATARACT EXTRACTION W/PHACO Left 04/11/2018   Procedure: CATARACT EXTRACTION PHACO AND INTRAOCULAR LENS PLACEMENT (IOC)  COMPLICATED LEFT;  Surgeon: Mittie Gaskin, MD;  Location[facility];  Service: Ophthalmology;  Laterality: Left;  OMIDRIA  sleep apnea   COLONOSCOPY  WITH PROPOFOL  N/A 11/25/2016   Procedure: COLONOSCOPY WITH PROPOFOL ;  Surgeon: Toledo, Ladell POUR, MD;  Location: [facility];  Service: Endoscopy;  Laterality: N/A;   ESOPHAGOGASTRODUODENOSCOPY (EGD) WITH PROPOFOL  N/A 11/25/2016   Procedure: ESOPHAGOGASTRODUODENOSCOPY (EGD) WITH PROPOFOL ;  Surgeon: Toledo, Ladell POUR, MD;  Location: [facility];  Service: Endoscopy;  Laterality: N/A;   HERNIA REPAIR     Inguinal,laparoscopic surgery   HOLEP-LASER ENUCLEATION OF THE PROSTATE WITH MORCELLATION N/A 03/19/2021   Procedure: HOLEP-LASER ENUCLEATION OF THE PROSTATE WITH MORCELLATION;  Surgeon: Francisca Redell BROCKS, MD;  Location: [facility];  Service: Urology;  Laterality: N/A;   INTERCOSTAL NERVE BLOCK Right  11/03/2022   Procedure: INTERCOSTAL NERVE BLOCK;  Surgeon: Shyrl Linnie KIDD, MD;  Location: [facility];  Service: Thoracic;  Laterality: Right;   LYMPH NODE BIOPSY Right 11/03/2022   Procedure: LYMPH NODE BIOPSY;  Surgeon: Shyrl Linnie KIDD, MD;  Location: [facility];  Service: Thoracic;  Laterality: Right;   PERIPHERAL VASCULAR CATHETERIZATION Right 05/21/2015   Procedure: Lower Extremity Angiography;  Surgeon: Selinda GORMAN Gu, MD;  Location: [facility];  Service: Cardiovascular;  Laterality: Right;   PERIPHERAL VASCULAR CATHETERIZATION  05/21/2015   Procedure: Lower Extremity Intervention;  Surgeon: Selinda GORMAN Gu, MD;  Location: [facility];  Service: Cardiovascular;;   radio-ablated therapy     completed in the pain clinic   RETINAL DETACHMENT SURGERY     on right eye x 2     Social History   Tobacco Use   Smoking status: Former    Current packs/day: 0.00    Average packs/day: 0.5 packs/day for 37.0 years (18.5 ttl pk-yrs)    Types: Cigarettes    Quit date: 09/02/2022    Years since quitting: 0.4    Passive exposure: Past   Smokeless tobacco: Never   Tobacco comments:    1 pack will last 2-3 days--08/15/2022  Vaping Use   Vaping status: Former  Substance Use Topics   Alcohol use: Yes    Alcohol/week: 0.0 standard drinks of alcohol    Comment: very occasional alcohol use - 3-4x/yr   Drug use: No       Family History  Problem Relation Age of Onset   Hypertension Mother    Breast cancer Mother 63   Uterine cancer Mother        unconfirmed; dx 26s; TAH/BSO; currently 35   Hyperlipidemia Father    Bladder Cancer Paternal Grandfather 40       smoker; deceased 38   Diabetes Maternal Grandmother    Non-Hodgkin's lymphoma Paternal Aunt        deceased 48     No Known Allergies   REVIEW OF SYSTEMS (Negative unless checked)  Constitutional: [x] Weight loss  [] Fever  [] Chills Cardiac: [] Chest pain   [] Chest pressure   [] Palpitations   [] Shortness of breath  when laying flat   [] Shortness of breath at rest   [x] Shortness of breath with exertion. Vascular:  [x] Pain in legs with walking   [] Pain in legs at rest   [] Pain in legs when laying flat   [] Claudication   [] Pain in feet when walking  [] Pain in feet at rest  [] Pain in feet when laying flat   [] History of DVT   [] Phlebitis   [] Swelling in legs   [] Varicose veins   [] Non-healing ulcers Pulmonary:   [] Uses home oxygen   [] Productive cough   [] Hemoptysis   [] Wheeze  [] COPD   [] Asthma Neurologic:  [] Dizziness  [] Blackouts   []   Seizures   [] History of stroke   [] History of TIA  [] Aphasia   [] Temporary blindness   [] Dysphagia   [] Weakness or numbness in arms   [] Weakness or numbness in legs Musculoskeletal:  [x] Arthritis   [] Joint swelling   [] Joint pain   [] Low back pain Hematologic:  [] Easy bruising  [] Easy bleeding   [] Hypercoagulable state   [] Anemic   Gastrointestinal:  [] Blood in stool   [] Vomiting blood  [] Gastroesophageal reflux/heartburn   [] Abdominal pain Genitourinary:  [] Chronic kidney disease   [] Difficult urination  [] Frequent urination  [] Burning with urination   [] Hematuria Skin:  [] Rashes   [] Ulcers   [] Wounds Psychological:  [] History of anxiety   []  History of major depression.  Physical Examination  BP (!) 156/99   Pulse (!) 55   Resp 18   Ht 5' 9 (1.753 m)   Wt 174 lb 12.8 oz (79.3 kg)   BMI 25.81 kg/m  Gen:  WD/WN, NAD Head: [place]/AT, No temporalis wasting. Ear/Nose/Throat: Hearing grossly intact, nares w/o erythema or drainage Eyes: Conjunctiva clear. Sclera non-icteric Neck: Supple.  Trachea midline Pulmonary:  Good air movement, no use of accessory muscles.  Cardiac: RRR, no JVD Vascular:  Vessel Right Left  Radial Palpable Palpable                          PT Palpable Palpable  DP Palpable Palpable   Gastrointestinal: soft, non-tender/non-distended. No guarding/reflex.  Musculoskeletal: M/S 5/5 throughout.  No deformity or atrophy. No edema. Neurologic:  Sensation grossly intact in extremities.  Symmetrical.  Speech is fluent.  Psychiatric: Judgment intact, Mood & affect appropriate for pt's clinical situation. Dermatologic: No rashes or ulcers noted.  No cellulitis or open wounds.      Labs Recent Results (from the past 2160 hours)  CBC with Differential/Platelet     Status: Abnormal   Collection Time: 11/10/22 11:54 AM  Result Value Ref Range   WBC 7.9 4.0 - 10.5 K/uL   RBC 3.88 (L) 4.22 - 5.81 Mil/uL   Hemoglobin 12.3 (L) 13.0 - 17.0 g/dL   HCT 62.9 (L) 60.9 - 47.9 %   MCV 95.4 78.0 - 100.0 fl   MCHC 33.3 30.0 - 36.0 g/dL   RDW 86.6 88.4 - 84.4 %   Platelets 356.0 150.0 - 400.0 K/uL   Neutrophils Relative % 63.5 43.0 - 77.0 %   Lymphocytes Relative 22.9 12.0 - 46.0 %   Monocytes Relative 10.4 3.0 - 12.0 %   Eosinophils Relative 2.7 0.0 - 5.0 %   Basophils Relative 0.5 0.0 - 3.0 %   Neutro Abs 5.0 1.4 - 7.7 K/uL   Lymphs Abs 1.8 0.7 - 4.0 K/uL   Monocytes Absolute 0.8 0.1 - 1.0 K/uL   Eosinophils Absolute 0.2 0.0 - 0.7 K/uL   Basophils Absolute 0.0 0.0 - 0.1 K/uL  Basic metabolic panel     Status: None   Collection Time: 11/10/22 11:54 AM  Result Value Ref Range   Sodium 138 135 - 145 mEq/L   Potassium 4.2 3.5 - 5.1 mEq/L   Chloride 101 96 - 112 mEq/L   CO2 28 19 - 32 mEq/L   Glucose, Bld 96 70 - 99 mg/dL   BUN 18 6 - 23 mg/dL   Creatinine, Ser 9.05 0.40 - 1.50 mg/dL   GFR 14.92 >39.99 mL/min    Comment: Calculated using the CKD-EPI Creatinine Equation (2021)   Calcium  9.4 8.4 - 10.5 mg/dL  Radiology No results found.  Assessment/Plan  Peripheral vascular disease (HCC) His ABIs today are 1.01 on the right and 1.07 on the left with triphasic waveforms and normal digital pressures bilaterally.  Doing well after revascularization several years ago.  Continue Plavix  and statin agent.  Recheck on an annual basis.  Essential hypertension, benign blood pressure control important in reducing the progression of  atherosclerotic disease. On appropriate oral medications.     Pure hypercholesterolemia lipid control important in reducing the progression of atherosclerotic disease. Continue statin therapy  Selinda Gu, MD  02/07/2023 11:27 AM    This note was created with Dragon medical transcription system.  Any errors from dictation are purely unintentional

## 2023-02-08 ENCOUNTER — Other Ambulatory Visit (HOSPITAL_COMMUNITY): Payer: Self-pay

## 2023-02-08 LAB — VAS US ABI WITH/WO TBI
Left ABI: 1.07
Right ABI: 1.01

## 2023-02-13 ENCOUNTER — Encounter: Payer: Self-pay | Admitting: Internal Medicine

## 2023-02-13 ENCOUNTER — Ambulatory Visit (INDEPENDENT_AMBULATORY_CARE_PROVIDER_SITE_OTHER): Payer: HMO | Admitting: Internal Medicine

## 2023-02-13 VITALS — BP 128/74 | HR 85 | Temp 98.3°F | Resp 16 | Ht 69.0 in | Wt 172.8 lb

## 2023-02-13 DIAGNOSIS — R739 Hyperglycemia, unspecified: Secondary | ICD-10-CM

## 2023-02-13 DIAGNOSIS — J449 Chronic obstructive pulmonary disease, unspecified: Secondary | ICD-10-CM | POA: Diagnosis not present

## 2023-02-13 DIAGNOSIS — I700 Atherosclerosis of aorta: Secondary | ICD-10-CM

## 2023-02-13 DIAGNOSIS — R911 Solitary pulmonary nodule: Secondary | ICD-10-CM | POA: Diagnosis not present

## 2023-02-13 DIAGNOSIS — Z860100 Personal history of colon polyps, unspecified: Secondary | ICD-10-CM | POA: Diagnosis not present

## 2023-02-13 DIAGNOSIS — Z720 Tobacco use: Secondary | ICD-10-CM | POA: Diagnosis not present

## 2023-02-13 DIAGNOSIS — I10 Essential (primary) hypertension: Secondary | ICD-10-CM

## 2023-02-13 DIAGNOSIS — R079 Chest pain, unspecified: Secondary | ICD-10-CM | POA: Diagnosis not present

## 2023-02-13 DIAGNOSIS — D649 Anemia, unspecified: Secondary | ICD-10-CM

## 2023-02-13 DIAGNOSIS — K219 Gastro-esophageal reflux disease without esophagitis: Secondary | ICD-10-CM | POA: Diagnosis not present

## 2023-02-13 DIAGNOSIS — E7800 Pure hypercholesterolemia, unspecified: Secondary | ICD-10-CM | POA: Diagnosis not present

## 2023-02-13 DIAGNOSIS — I739 Peripheral vascular disease, unspecified: Secondary | ICD-10-CM | POA: Diagnosis not present

## 2023-02-13 LAB — HEPATIC FUNCTION PANEL
ALT: 14 U/L (ref 0–53)
AST: 17 U/L (ref 0–37)
Albumin: 4.7 g/dL (ref 3.5–5.2)
Alkaline Phosphatase: 71 U/L (ref 39–117)
Bilirubin, Direct: 0.1 mg/dL (ref 0.0–0.3)
Total Bilirubin: 0.5 mg/dL (ref 0.2–1.2)
Total Protein: 7.2 g/dL (ref 6.0–8.3)

## 2023-02-13 LAB — CBC WITH DIFFERENTIAL/PLATELET
Basophils Absolute: 0 10*3/uL (ref 0.0–0.1)
Basophils Relative: 0.6 % (ref 0.0–3.0)
Eosinophils Absolute: 0.1 10*3/uL (ref 0.0–0.7)
Eosinophils Relative: 1.4 % (ref 0.0–5.0)
HCT: 42.1 % (ref 39.0–52.0)
Hemoglobin: 14.4 g/dL (ref 13.0–17.0)
Lymphocytes Relative: 31.4 % (ref 12.0–46.0)
Lymphs Abs: 1.7 10*3/uL (ref 0.7–4.0)
MCHC: 34.1 g/dL (ref 30.0–36.0)
MCV: 93 fL (ref 78.0–100.0)
Monocytes Absolute: 0.5 10*3/uL (ref 0.1–1.0)
Monocytes Relative: 8.5 % (ref 3.0–12.0)
Neutro Abs: 3.2 10*3/uL (ref 1.4–7.7)
Neutrophils Relative %: 58.1 % (ref 43.0–77.0)
Platelets: 251 10*3/uL (ref 150.0–400.0)
RBC: 4.53 Mil/uL (ref 4.22–5.81)
RDW: 12.9 % (ref 11.5–15.5)
WBC: 5.5 10*3/uL (ref 4.0–10.5)

## 2023-02-13 LAB — BASIC METABOLIC PANEL
BUN: 16 mg/dL (ref 6–23)
CO2: 30 meq/L (ref 19–32)
Calcium: 9.7 mg/dL (ref 8.4–10.5)
Chloride: 100 meq/L (ref 96–112)
Creatinine, Ser: 0.98 mg/dL (ref 0.40–1.50)
GFR: 80.78 mL/min (ref >60.00)
Glucose, Bld: 106 mg/dL — ABNORMAL HIGH (ref 70–99)
Potassium: 4.1 meq/L (ref 3.5–5.1)
Sodium: 140 meq/L (ref 135–145)

## 2023-02-13 LAB — LIPID PANEL
Cholesterol: 213 mg/dL — ABNORMAL HIGH (ref 0–200)
HDL: 31.6 mg/dL — ABNORMAL LOW (ref >39.00)
NonHDL: 181.49
Total CHOL/HDL Ratio: 7
Triglycerides: 718 mg/dL — ABNORMAL HIGH (ref 0.0–149.0)
VLDL: 143.6 mg/dL — ABNORMAL HIGH (ref 0.0–40.0)

## 2023-02-13 LAB — IBC + FERRITIN
Ferritin: 79 ng/mL (ref 22.0–322.0)
Iron: 129 ug/dL (ref 42–165)
Saturation Ratios: 31.4 % (ref 20.0–50.0)
TIBC: 410.2 ug/dL (ref 250.0–450.0)
Transferrin: 293 mg/dL (ref 212.0–360.0)

## 2023-02-13 LAB — HEMOGLOBIN A1C: Hgb A1c MFr Bld: 5.9 % (ref 4.6–6.5)

## 2023-02-13 LAB — LDL CHOLESTEROL, DIRECT: Direct LDL: 63 mg/dL

## 2023-02-13 LAB — VITAMIN B12: Vitamin B-12: 648 pg/mL (ref 211–911)

## 2023-02-13 MED ORDER — LOSARTAN POTASSIUM 100 MG PO TABS: 100.0000 mg | ORAL_TABLET | Freq: Every day | ORAL | 3 refills | Status: AC

## 2023-02-13 MED ORDER — PANTOPRAZOLE SODIUM 40 MG PO TBEC: 40.0000 mg | DELAYED_RELEASE_TABLET | Freq: Every day | ORAL | 3 refills | Status: AC

## 2023-02-13 MED ORDER — AMITRIPTYLINE HCL 25 MG PO TABS: 25.0000 mg | ORAL_TABLET | Freq: Every day | ORAL | 3 refills | Status: DC

## 2023-02-13 MED ORDER — ATORVASTATIN CALCIUM 20 MG PO TABS: 20.0000 mg | ORAL_TABLET | Freq: Every day | ORAL | 3 refills | Status: DC

## 2023-02-13 MED ORDER — DULOXETINE HCL 30 MG PO CPEP: 30.0000 mg | ORAL_CAPSULE | Freq: Every day | ORAL | 3 refills | Status: AC

## 2023-02-13 NOTE — Assessment & Plan Note (Signed)
Admitted 11/03/22 - 11/07/22 - He underwent robotic assisted right upper lobectomy. Pathology was negative for malignancy, but there was a background of interstitial lung disease. Had f/u with pulmonary 11/15/22 - recommended trelegy.

## 2023-02-13 NOTE — Assessment & Plan Note (Signed)
F/u 02/07/23 - His ABIs today are 1.01 on the right and 1.07 on the left with triphasic waveforms and normal digital pressures bilaterally. Doing well after revascularization several years ago. Continue Plavix and statin agent. Recheck on an annual basi

## 2023-02-13 NOTE — Assessment & Plan Note (Signed)
On toprol and losartan. Follow pressures. Follow metabolic panel.

## 2023-02-13 NOTE — Progress Notes (Signed)
Subjective:    Patient ID: Travis Gray, male    DOB: 11-Nov-1955, 68 y.o.   MRN: [id number]  Patient here for  Chief Complaint  Patient presents with   Medical Management of Chronic Issues    HPI Here for a scheduled follow up - follow up regarding PAD, hypertension and hypercholesterolemia. Recently admitted 11/03/22 - 11/07/22 - He underwent robotic assisted right upper lobectomy. It was felt he had controlled rate atrial fibrillation, Amiodarone PO 400mg  BID and Lopressor 12.5mg  BID was started. It was later felt this was NSR with ventricular bigeminy, Amiodarone was discontinued and he was restarted on home Toprol XL 25mg  daily with parameters. Developed a right apical pneumothorax.  S/p chest tube. Third post op day - chest tube removed.  Follow up CXR showed a stable possible tiny right apical pneumothorax. Was discharged on toprol. Hydrochlorothiazide and losartan held. Pathology was negative for malignancy, but there was a background of interstitial lung disease. Had f/u with pulmonary 11/15/22 - recommended trelegy. Has not been using lately. Discussed restarting. Had f/u with CTS 12/13/22 - continued incisional pain. Trial of gabapentin. Can't tell that this is making a big difference. Plans to stop. States was told would take months to get better. No worsening pain or change in symptoms. Breathing stable. No increased sob. Had f/u with [facility] 02/07/23 - ABIs reviewed - stable, recommended continuing plavix and statin. No abdominal pain or bowel change.    Past Medical History:  Diagnosis Date   Chronic headaches    migraines, 3-4x/yr   Degenerative disc disease, cervical    Genetic testing 03/20/2017   Multi-Cancer panel (83 genes) @ [facility] - No pathogenic mutations detected   GERD (gastroesophageal reflux disease)    Hypercholesterolemia    Hypertension    Myocardial infarction (HCC)    Old Infarct based on Stress test results in 2024   Obesity    Peripheral vascular  disease (HCC)    groin and right leg stents   Skin cancer    arm/ face area.  Has had removed   Sleep apnea    CPAP   Tobacco abuse    Past Surgical History:  Procedure Laterality Date   CARDIAC CATHETERIZATION     CATARACT EXTRACTION W/PHACO Left 04/11/2018   Procedure: CATARACT EXTRACTION PHACO AND INTRAOCULAR LENS PLACEMENT (IOC)  COMPLICATED LEFT;  Surgeon: Lockie Mola, MD;  Location: [facility];  Service: Ophthalmology;  Laterality: Left;  OMIDRIA sleep apnea   COLONOSCOPY WITH PROPOFOL N/A 11/25/2016   Procedure: COLONOSCOPY WITH PROPOFOL;  Surgeon: Toledo, Boykin Nearing, MD;  Location: [facility];  Service: Endoscopy;  Laterality: N/A;   ESOPHAGOGASTRODUODENOSCOPY (EGD) WITH PROPOFOL N/A 11/25/2016   Procedure: ESOPHAGOGASTRODUODENOSCOPY (EGD) WITH PROPOFOL;  Surgeon: Toledo, Boykin Nearing, MD;  Location: [facility];  Service: Endoscopy;  Laterality: N/A;   HERNIA REPAIR     Inguinal,laparoscopic surgery   HOLEP-LASER ENUCLEATION OF THE PROSTATE WITH MORCELLATION N/A 03/19/2021   Procedure: HOLEP-LASER ENUCLEATION OF THE PROSTATE WITH MORCELLATION;  Surgeon: Sondra Come, MD;  Location: [facility];  Service: Urology;  Laterality: N/A;   INTERCOSTAL NERVE BLOCK Right 11/03/2022   Procedure: INTERCOSTAL NERVE BLOCK;  Surgeon: [name], MD;  Location: [facility] OR;  Service: Thoracic;  Laterality: Right;   LYMPH NODE BIOPSY Right 11/03/2022   Procedure: LYMPH NODE BIOPSY;  Surgeon: [name], MD;  Location: [facility] OR;  Service: Thoracic;  Laterality: Right;   PERIPHERAL VASCULAR CATHETERIZATION Right 05/21/2015   Procedure: Lower  Extremity Angiography;  Surgeon: Annice Needy, MD;  Location: [facility];  Service: Cardiovascular;  Laterality: Right;   PERIPHERAL VASCULAR CATHETERIZATION  05/21/2015   Procedure: Lower Extremity Intervention;  Surgeon: Annice Needy, MD;  Location: [facility] INVASIVE CV LAB;  Service: Cardiovascular;;   radio-ablated  therapy     completed in the pain clinic   RETINAL DETACHMENT SURGERY     on right eye x 2   Family History  Problem Relation Age of Onset   Hypertension Mother    Breast cancer Mother 72   Uterine cancer Mother        unconfirmed; dx 104s; TAH/BSO; currently 42   Hyperlipidemia Father    Bladder Cancer Paternal Grandfather 33       smoker; deceased 78   Diabetes Maternal Grandmother    Non-Hodgkin's lymphoma Paternal Aunt        deceased 54   Social History   Socioeconomic History   Marital status: Married    Spouse name: Not on file   Number of children: Not on file   Years of education: Not on file   Highest education level: Not on file  Occupational History   Not on file  Tobacco Use   Smoking status: Former    Current packs/day: 0.00    Average packs/day: 0.5 packs/day for 37.0 years (18.5 ttl pk-yrs)    Types: Cigarettes    Quit date: 09/02/2022    Years since quitting: 0.4    Passive exposure: Past   Smokeless tobacco: Never   Tobacco comments:    1 pack will last 2-3 days--08/15/2022  Vaping Use   Vaping status: Former  Substance and Sexual Activity   Alcohol use: Yes    Alcohol/week: 0.0 standard drinks of alcohol    Comment: very occasional alcohol use - 3-4x/yr   Drug use: No   Sexual activity: Yes  Other Topics Concern   Not on file  Social History Narrative   Not on file   Social Drivers of Health   Financial Resource Strain: Not on file  Food Insecurity: No Food Insecurity (08/24/2021)   Hunger Vital Sign    Worried About Running Out of Food in the Last Year: Never true    Ran Out of Food in the Last Year: Never true  Transportation Needs: No Transportation Needs (08/24/2021)   PRAPARE - Administrator, Civil Service (Medical): No    Lack of Transportation (Non-Medical): No  Physical Activity: Not on file  Stress: Not on file  Social Connections: Not on file     Review of Systems  Constitutional:  Negative for appetite change and  unexpected weight change.  HENT:  Negative for congestion and sinus pressure.   Respiratory:  Negative for chest tightness.        Breathing stable. No increased cough.   Cardiovascular:  Negative for chest pain, palpitations and leg swelling.  Gastrointestinal:  Negative for abdominal pain, diarrhea, nausea and vomiting.  Genitourinary:  Negative for difficulty urinating and dysuria.  Musculoskeletal:  Negative for joint swelling and myalgias.       Pain - incision site - chest.   Skin:  Negative for color change and rash.  Neurological:  Negative for dizziness and headaches.  Psychiatric/Behavioral:  Negative for agitation and dysphoric mood.        Objective:     BP 128/74   Pulse 85   Temp 98.3 F (36.8 C)   Resp 16  Ht 5\' 9"  (1.753 m)   Wt 172 lb 12.8 oz (78.4 kg)   SpO2 99%   BMI 25.52 kg/m  Wt Readings from Last 3 Encounters:  02/13/23 172 lb 12.8 oz (78.4 kg)  02/07/23 174 lb 12.8 oz (79.3 kg)  12/13/22 170 lb (77.1 kg)    Physical Exam Vitals reviewed.  Constitutional:      General: He is not in acute distress.    Appearance: Normal appearance. He is well-developed.  HENT:     Head: Normocephalic and atraumatic.     Right Ear: External ear normal.     Left Ear: External ear normal.     Mouth/Throat:     Pharynx: No oropharyngeal exudate or posterior oropharyngeal erythema.  Eyes:     General: No scleral icterus.       Right eye: No discharge.        Left eye: No discharge.     Conjunctiva/sclera: Conjunctivae normal.  Cardiovascular:     Rate and Rhythm: Normal rate and regular rhythm.  Pulmonary:     Effort: Pulmonary effort is normal. No respiratory distress.     Breath sounds: Normal breath sounds.  Abdominal:     General: Bowel sounds are normal.     Palpations: Abdomen is soft.     Tenderness: There is no abdominal tenderness.  Musculoskeletal:        General: No swelling or tenderness.     Cervical back: Neck supple. No tenderness.   Lymphadenopathy:     Cervical: No cervical adenopathy.  Skin:    Findings: No erythema or rash.     Comments: Well healed incision site. Numb sensation to touch.   Neurological:     Mental Status: He is alert.  Psychiatric:        Mood and Affect: Mood normal.        Behavior: Behavior normal.         Outpatient Encounter Medications as of 02/13/2023  Medication Sig   albuterol (VENTOLIN HFA) 108 (90 Base) MCG/ACT inhaler Inhale 2 puffs into the lungs every 6 (six) hours as needed for wheezing or shortness of breath.   amitriptyline (ELAVIL) 25 MG tablet Take 1 tablet (25 mg total) by mouth at bedtime.   atorvastatin (LIPITOR) 20 MG tablet Take 1 tablet (20 mg total) by mouth at bedtime.   clopidogrel (PLAVIX) 75 MG tablet TAKE 1 TABLET BY MOUTH ONCE DAILY   DULoxetine (CYMBALTA) 30 MG capsule Take 1 capsule (30 mg total) by mouth daily.   Fluticasone-Umeclidin-Vilant (TRELEGY ELLIPTA) 100-62.5-25 MCG/ACT AEPB Inhale 1 puff into the lungs daily.   gabapentin (NEURONTIN) 300 MG capsule Take 1 capsule (300 mg total) by mouth daily.   losartan (COZAAR) 100 MG tablet Take 1 tablet (100 mg total) by mouth daily.   metoprolol succinate (TOPROL-XL) 25 MG 24 hr tablet TAKE 1 TABLET BY MOUTH ONCE DAILY   Omega-3 Fatty Acids (FISH OIL PO) Take 2 capsules by mouth in the morning.   pantoprazole (PROTONIX) 40 MG tablet Take 1 tablet (40 mg total) by mouth daily.   vitamin B-12 (CYANOCOBALAMIN) 1000 MCG tablet Take 1,000 mcg by mouth in the morning.   [DISCONTINUED] amitriptyline (ELAVIL) 25 MG tablet TAKE 1 TABLET BY MOUTH AT BEDTIME   [DISCONTINUED] atorvastatin (LIPITOR) 20 MG tablet TAKE 1 TABLET BY MOUTH AT BEDTIME   [DISCONTINUED] DULoxetine (CYMBALTA) 30 MG capsule TAKE 1 CAPSULE BY MOUTH ONCE DAILY   [DISCONTINUED] losartan (COZAAR) 100 MG tablet TAKE  1 TABLET BY MOUTH ONCE DAILY   [DISCONTINUED] oxyCODONE (OXY IR/ROXICODONE) 5 MG immediate release tablet Take 1 tablet (5 mg total) by  mouth every 6 (six) hours as needed for moderate pain.   [DISCONTINUED] pantoprazole (PROTONIX) 40 MG tablet TAKE 1 TABLET BY MOUTH ONCE DAILY   No facility-administered encounter medications on file as of 02/13/2023.     Lab Results  Component Value Date   WBC 5.5 02/13/2023   HGB 14.4 02/13/2023   HCT 42.1 02/13/2023   PLT 251.0 02/13/2023   GLUCOSE 106 (H) 02/13/2023   CHOL 213 (H) 02/13/2023   TRIG (H) 02/13/2023    718.0 Triglyceride is over 400; calculations on Lipids are invalid.   HDL 31.60 (L) 02/13/2023   LDLDIRECT 63.0 02/13/2023   LDLCALC 60 04/24/2013   ALT 14 02/13/2023   AST 17 02/13/2023   NA 140 02/13/2023   K 4.1 02/13/2023   CL 100 02/13/2023   CREATININE 0.98 02/13/2023   BUN 16 02/13/2023   CO2 30 02/13/2023   TSH 3.28 06/13/2022   PSA 0.18 06/13/2022   INR 1.0 11/01/2022   HGBA1C 5.9 02/13/2023    DG Chest 2 View Result Date: 12/13/2022 CLINICAL DATA:  History of prior right upper lobectomy EXAM: CHEST - 2 VIEW COMPARISON:  11/07/2022 FINDINGS: Cardiac shadow is within normal limits. The lungs are well aerated bilaterally. Postsurgical changes are noted in the right suprahilar region. No pneumothorax is seen. No bony abnormality is noted. IMPRESSION: No active cardiopulmonary disease. Electronically Signed   By: Alcide Clever [name]   On: 12/13/2022 19:02       Assessment & Plan:  Hypercholesterolemia Assessment & Plan: Continue lipitor.  Low cholesterol diet and exercise as tolerated.  Improved.  Lab Results  Component Value Date   CHOL 213 (H) 02/13/2023   HDL 31.60 (L) 02/13/2023   LDLCALC 60 04/24/2013   LDLDIRECT 63.0 02/13/2023   TRIG (H) 02/13/2023    718.0 Triglyceride is over 400; calculations on Lipids are invalid.   CHOLHDL 7 02/13/2023    Orders: -     Basic metabolic panel -     Hepatic function panel -     Lipid panel  Hyperglycemia Assessment & Plan: Low carb diet and exercise.  Follow met b and a1c.   Orders: -      Hemoglobin A1c  Anemia, unspecified type Assessment & Plan: Hgb decreased after surgery.  Recheck cbc today.   Orders: -     CBC with Differential/Platelet -     Vitamin B12 -     IBC + Ferritin  Essential hypertension, benign Assessment & Plan: On toprol and losartan. Follow pressures. Follow metabolic panel.    Aortic atherosclerosis (HCC) Assessment & Plan: Continue lipitor.    Chronic obstructive pulmonary disease, unspecified COPD type [facility]) Assessment & Plan: Saw pulmonary 10/2022 - recommended starting trelegy. Albuterol prn. Discussed restarting trelegy. Follow. Plan f/u with pulmonary.    Gastroesophageal reflux disease, unspecified whether esophagitis present Assessment & Plan: No upper symptoms reported.  On protonix.    History of colonic polyps Assessment & Plan: Colonoscopy 11/2016 - rectal polyp - tubular adenoma.  Recommended f/u colonoscopy in 5 years. (Dr Norma Fredrickson).  Contact GI regarding f/u colonoscopy - will need f/u.    Lung nodule Assessment & Plan: Admitted 11/03/22 - 11/07/22 - He underwent robotic assisted right upper lobectomy. Pathology was negative for malignancy, but there was a background of interstitial lung disease. Had f/u with pulmonary  11/15/22 - recommended trelegy.    Peripheral vascular disease (HCC) Assessment & Plan: F/u 02/07/23 - His ABIs today are 1.01 on the right and 1.07 on the left with triphasic waveforms and normal digital pressures bilaterally. Doing well after revascularization several years ago. Continue Plavix and statin agent. Recheck on an annual basi    Tobacco abuse Assessment & Plan: Not smoking currently. Discussed recommendation - remain off cigarettes.    Chest pain, unspecified type Assessment & Plan: Pain over the incision site.  Numbness around site. Has seen CTS. Aware.  Recommended to follow.     Other orders -     Amitriptyline HCl; Take 1 tablet (25 mg total) by mouth at bedtime.  Dispense: 90  tablet; Refill: 3 -     Atorvastatin Calcium; Take 1 tablet (20 mg total) by mouth at bedtime.  Dispense: 90 tablet; Refill: 3 -     DULoxetine HCl; Take 1 capsule (30 mg total) by mouth daily.  Dispense: 90 capsule; Refill: 3 -     Losartan Potassium; Take 1 tablet (100 mg total) by mouth daily.  Dispense: 90 tablet; Refill: 3 -     Pantoprazole Sodium; Take 1 tablet (40 mg total) by mouth daily.  Dispense: 90 tablet; Refill: 3 -     LDL cholesterol, direct     Dale Emajagua, MD

## 2023-02-13 NOTE — Assessment & Plan Note (Signed)
Hgb decreased after surgery.  Recheck cbc today.

## 2023-02-13 NOTE — Assessment & Plan Note (Signed)
Saw pulmonary 10/2022 - recommended starting trelegy. Albuterol prn. Discussed restarting trelegy. Follow. Plan f/u with pulmonary.

## 2023-02-13 NOTE — Assessment & Plan Note (Signed)
Low carb diet and exercise.  Follow met b and a1c.   

## 2023-02-13 NOTE — Assessment & Plan Note (Signed)
Continue lipitor  ?

## 2023-02-13 NOTE — Assessment & Plan Note (Signed)
Colonoscopy 11/2016 - rectal polyp - tubular adenoma.  Recommended f/u colonoscopy in 5 years. (Dr Norma Fredrickson).  Contact GI regarding f/u colonoscopy - will need f/u.

## 2023-02-13 NOTE — Assessment & Plan Note (Signed)
No upper symptoms reported.  On protonix.   

## 2023-02-13 NOTE — Assessment & Plan Note (Signed)
Not smoking currently. Discussed recommendation - remain off cigarettes.

## 2023-02-13 NOTE — Assessment & Plan Note (Signed)
Pain over the incision site.  Numbness around site. Has seen CTS. Aware.  Recommended to follow.

## 2023-02-13 NOTE — Assessment & Plan Note (Signed)
Continue lipitor.  Low cholesterol diet and exercise as tolerated.  Improved.  Lab Results  Component Value Date   CHOL 213 (H) 02/13/2023   HDL 31.60 (L) 02/13/2023   LDLCALC 60 04/24/2013   LDLDIRECT 63.0 02/13/2023   TRIG (H) 02/13/2023    718.0 Triglyceride is over 400; calculations on Lipids are invalid.   CHOLHDL 7 02/13/2023

## 2023-02-21 MED ORDER — FENOFIBRATE 145 MG PO TABS: 145.0000 mg | ORAL_TABLET | Freq: Every day | ORAL | 0 refills | Status: DC

## 2023-02-21 NOTE — Addendum Note (Signed)
Addended by: Prince Solian A on: 02/21/2023 09:14 AM   Modules accepted: Orders

## 2023-02-22 ENCOUNTER — Other Ambulatory Visit: Payer: Self-pay | Admitting: Internal Medicine

## 2023-02-22 MED ORDER — FENOFIBRATE 145 MG PO TABS: 145.0000 mg | ORAL_TABLET | Freq: Every day | ORAL | 2 refills | Status: DC

## 2023-02-22 NOTE — Progress Notes (Signed)
Rx for fenofibrate sent in with refills.

## 2023-02-27 ENCOUNTER — Ambulatory Visit: Payer: Self-pay | Admitting: Internal Medicine

## 2023-02-27 DIAGNOSIS — J101 Influenza due to other identified influenza virus with other respiratory manifestations: Secondary | ICD-10-CM | POA: Diagnosis not present

## 2023-02-27 DIAGNOSIS — I739 Peripheral vascular disease, unspecified: Secondary | ICD-10-CM | POA: Diagnosis not present

## 2023-02-27 DIAGNOSIS — I10 Essential (primary) hypertension: Secondary | ICD-10-CM | POA: Diagnosis not present

## 2023-02-27 DIAGNOSIS — Z87891 Personal history of nicotine dependence: Secondary | ICD-10-CM | POA: Diagnosis not present

## 2023-02-27 DIAGNOSIS — E7800 Pure hypercholesterolemia, unspecified: Secondary | ICD-10-CM | POA: Diagnosis not present

## 2023-02-27 DIAGNOSIS — Z20822 Contact with and (suspected) exposure to covid-19: Secondary | ICD-10-CM | POA: Diagnosis not present

## 2023-02-27 DIAGNOSIS — R9431 Abnormal electrocardiogram [ECG] [EKG]: Secondary | ICD-10-CM | POA: Diagnosis not present

## 2023-02-27 DIAGNOSIS — J440 Chronic obstructive pulmonary disease with acute lower respiratory infection: Secondary | ICD-10-CM | POA: Diagnosis not present

## 2023-02-27 DIAGNOSIS — J939 Pneumothorax, unspecified: Secondary | ICD-10-CM | POA: Diagnosis not present

## 2023-02-27 DIAGNOSIS — J111 Influenza due to unidentified influenza virus with other respiratory manifestations: Secondary | ICD-10-CM | POA: Diagnosis not present

## 2023-02-27 DIAGNOSIS — R0602 Shortness of breath: Secondary | ICD-10-CM | POA: Diagnosis not present

## 2023-02-27 DIAGNOSIS — J449 Chronic obstructive pulmonary disease, unspecified: Secondary | ICD-10-CM | POA: Diagnosis not present

## 2023-02-27 DIAGNOSIS — I252 Old myocardial infarction: Secondary | ICD-10-CM | POA: Diagnosis not present

## 2023-02-27 DIAGNOSIS — N179 Acute kidney failure, unspecified: Secondary | ICD-10-CM | POA: Diagnosis not present

## 2023-02-27 DIAGNOSIS — Q2546 Tortuous aortic arch: Secondary | ICD-10-CM | POA: Diagnosis not present

## 2023-02-27 DIAGNOSIS — R0902 Hypoxemia: Secondary | ICD-10-CM | POA: Diagnosis not present

## 2023-02-27 NOTE — Telephone Encounter (Signed)
FYI patient has checked in at [facility] ED.

## 2023-02-27 NOTE — Telephone Encounter (Signed)
Pt in [facility] ED now.

## 2023-02-27 NOTE — Telephone Encounter (Signed)
Chief Complaint: shortness of breath Symptoms: shortness of breath with any movement, chest pain, fever, chills, cough Frequency: 2 days Pertinent Negatives: Patient denies dizziness Disposition: [x] ED /[] Urgent Care (no appt availability in office) / [] Appointment(In office/virtual)/ []  [facility]/ [] Home Care/ [] Refused Recommended Disposition /[] [place] [facility]/ []  Follow-up with PCP Additional Notes: patient with a previous lobectomy in late 2024-daughter called in with concerns for shortness of breath. States his wife was admitted to the hospital yesterday with shortness of breath and tested positive for flu. Daughter states patient is getting very short of breath with walking to the bathroom with is located 15 feet from his bedroom. Daughter states pulse ox readings from 87% to 92% on room air. Patient endorses generally not feeling well for the last four days. Per protocol, the recommendation would be for the Emergency Department. Daughter is instructed to call 911 for transport and field evaluation of patient. Daughter verbalizes understanding of plan and will be calling 911. All questions answered.    Copied from CRM [id number]. Topic: Clinical - Red Word Triage >> Feb 27, 2023 10:38 AM Elizebeth Brooking wrote: [facility] that prompted transfer to Nurse Triage: Wife tested positive for the flu, has had it for over 3 days. Half of his lung removed in mid sep wanted to know if she needs to bring him in or is there something that . Is having trouble cathing his breath Reason for Disposition  [1] MODERATE difficulty breathing (e.g., speaks in phrases, SOB even at rest, pulse 100-120) AND [2] NEW-onset or WORSE than normal  Answer Assessment - Initial Assessment Questions 1. RESPIRATORY STATUS: "Describe your breathing?" (e.g., wheezing, shortness of breath, unable to speak, severe coughing)      Shortness of breath 2. ONSET: "When did this breathing problem begin?"      At least 2  days per daughter 3. PATTERN "Does the difficult breathing come and go, or has it been constant since it started?"      constant 4. SEVERITY: "How bad is your breathing?" (e.g., mild, moderate, severe)    - MILD: No SOB at rest, mild SOB with walking, speaks normally in sentences, can lie down, no retractions, pulse < 100.    - MODERATE: SOB at rest, SOB with minimal exertion and prefers to sit, cannot lie down flat, speaks in phrases, mild retractions, audible wheezing, pulse 100-120.    - SEVERE: Very SOB at rest, speaks in single words, struggling to breathe, sitting hunched forward, retractions, pulse > 120      Moderate 5. RECURRENT SYMPTOM: "Have you had difficulty breathing before?" If Yes, ask: "When was the last time?" and "What happened that time?"      No 6. CARDIAC HISTORY: "Do you have any history of heart disease?" (e.g., heart attack, angina, bypass surgery, angioplasty)      CVD 7. LUNG HISTORY: "Do you have any history of lung disease?"  (e.g., pulmonary embolus, asthma, emphysema)     Removal of part of his lung in September, former smoker 8. CAUSE: "What do you think is causing the breathing problem?"      Wife tested  9. OTHER SYMPTOMS: "Do you have any other symptoms? (e.g., dizziness, runny nose, cough, chest pain, fever)     Cough, chills, chest pain 10. O2 SATURATION MONITOR:  "Do you use an oxygen saturation monitor (pulse oximeter) at home?" If Yes, ask: "What is your reading (oxygen level) today?" "What is your usual oxygen saturation reading?" (e.g., 95%)  87%-92% 12. TRAVEL: "Have you traveled out of the country in the last month?" (e.g., travel history, exposures)       No  Protocols used: Breathing Difficulty-A-AH

## 2023-02-28 DIAGNOSIS — R197 Diarrhea, unspecified: Secondary | ICD-10-CM | POA: Diagnosis not present

## 2023-02-28 DIAGNOSIS — R0902 Hypoxemia: Secondary | ICD-10-CM | POA: Diagnosis not present

## 2023-02-28 DIAGNOSIS — J101 Influenza due to other identified influenza virus with other respiratory manifestations: Secondary | ICD-10-CM | POA: Diagnosis not present

## 2023-02-28 DIAGNOSIS — N179 Acute kidney failure, unspecified: Secondary | ICD-10-CM | POA: Diagnosis not present

## 2023-03-08 ENCOUNTER — Ambulatory Visit: Payer: HMO | Admitting: Internal Medicine

## 2023-03-08 VITALS — BP 130/70 | HR 70 | Temp 97.8°F | Resp 16 | Ht 69.0 in | Wt 168.0 lb

## 2023-03-08 DIAGNOSIS — E876 Hypokalemia: Secondary | ICD-10-CM

## 2023-03-08 DIAGNOSIS — D72819 Decreased white blood cell count, unspecified: Secondary | ICD-10-CM

## 2023-03-08 DIAGNOSIS — I10 Essential (primary) hypertension: Secondary | ICD-10-CM

## 2023-03-08 DIAGNOSIS — J449 Chronic obstructive pulmonary disease, unspecified: Secondary | ICD-10-CM

## 2023-03-08 DIAGNOSIS — J111 Influenza due to unidentified influenza virus with other respiratory manifestations: Secondary | ICD-10-CM

## 2023-03-08 DIAGNOSIS — K219 Gastro-esophageal reflux disease without esophagitis: Secondary | ICD-10-CM | POA: Diagnosis not present

## 2023-03-08 LAB — BASIC METABOLIC PANEL
BUN: 15 mg/dL (ref 6–23)
CO2: 30 meq/L (ref 19–32)
Calcium: 9.3 mg/dL (ref 8.4–10.5)
Chloride: 100 meq/L (ref 96–112)
Creatinine, Ser: 1.1 mg/dL (ref 0.40–1.50)
GFR: 70.29 mL/min (ref >60.00)
Glucose, Bld: 91 mg/dL (ref 70–99)
Potassium: 3.6 meq/L (ref 3.5–5.1)
Sodium: 139 meq/L (ref 135–145)

## 2023-03-08 LAB — CBC WITH DIFFERENTIAL/PLATELET
Basophils Absolute: 0 10*3/uL (ref 0.0–0.1)
Basophils Relative: 0.4 % (ref 0.0–3.0)
Eosinophils Absolute: 0.1 10*3/uL (ref 0.0–0.7)
Eosinophils Relative: 1.6 % (ref 0.0–5.0)
HCT: 37.8 % — ABNORMAL LOW (ref 39.0–52.0)
Hemoglobin: 13.2 g/dL (ref 13.0–17.0)
Lymphocytes Relative: 29 % (ref 12.0–46.0)
Lymphs Abs: 2.1 10*3/uL (ref 0.7–4.0)
MCHC: 34.8 g/dL (ref 30.0–36.0)
MCV: 91.6 fL (ref 78.0–100.0)
Monocytes Absolute: 0.8 10*3/uL (ref 0.1–1.0)
Monocytes Relative: 11.4 % (ref 3.0–12.0)
Neutro Abs: 4.1 10*3/uL (ref 1.4–7.7)
Neutrophils Relative %: 57.6 % (ref 43.0–77.0)
Platelets: 346 10*3/uL (ref 150.0–400.0)
RBC: 4.13 Mil/uL — ABNORMAL LOW (ref 4.22–5.81)
RDW: 13.2 % (ref 11.5–15.5)
WBC: 7.2 10*3/uL (ref 4.0–10.5)

## 2023-03-08 NOTE — Progress Notes (Signed)
Subjective:    Patient ID: Travis Gray, male    DOB: 1955-06-16, 68 y.o.   MRN: [id number]  Patient here for  Chief Complaint  Patient presents with   Hospitalization Follow-up    HPI Here for a hospital follow up - history of - HTN, HLD, PAD, recent robotic assisted RUL lobectomy c/b right apical pneumothorax and chest tube x 3 days, COPD/asthma overlap. Admitted with influenza A infection, notable for mild exertional hypoxia and diarrhea. Admitted 02/27/23 - 02/28/23. Also had AKI on admission. Diagnosed with  influenza. Treated with tamiflu and discharged to complete tamiflu. Reports that he is doing better. Feeling better. Appetite is improving. No chest pain. No increased sob. Energy improving. Discussed lab results and recommendation to repeat today.    Past Medical History:  Diagnosis Date   Chronic headaches    migraines, 3-4x/yr   Degenerative disc disease, cervical    Genetic testing 03/20/2017   Multi-Cancer panel (83 genes) @ [facility] - No pathogenic mutations detected   GERD (gastroesophageal reflux disease)    Hypercholesterolemia    Hypertension    Myocardial infarction (HCC)    Old Infarct based on Stress test results in 2024   Obesity    Peripheral vascular disease (HCC)    groin and right leg stents   Skin cancer    arm/ face area.  Has had removed   Sleep apnea    CPAP   Tobacco abuse    Past Surgical History:  Procedure Laterality Date   CARDIAC CATHETERIZATION     CATARACT EXTRACTION W/PHACO Left 04/11/2018   Procedure: CATARACT EXTRACTION PHACO AND INTRAOCULAR LENS PLACEMENT (IOC)  COMPLICATED LEFT;  Surgeon: Lockie Mola, MD;  Location: [facility];  Service: Ophthalmology;  Laterality: Left;  OMIDRIA sleep apnea   COLONOSCOPY WITH PROPOFOL N/A 11/25/2016   Procedure: COLONOSCOPY WITH PROPOFOL;  Surgeon: Toledo, Boykin Nearing, MD;  Location: [facility];  Service: Endoscopy;  Laterality: N/A;   ESOPHAGOGASTRODUODENOSCOPY (EGD) WITH  PROPOFOL N/A 11/25/2016   Procedure: ESOPHAGOGASTRODUODENOSCOPY (EGD) WITH PROPOFOL;  Surgeon: Toledo, Boykin Nearing, MD;  Location: [facility];  Service: Endoscopy;  Laterality: N/A;   HERNIA REPAIR     Inguinal,laparoscopic surgery   HOLEP-LASER ENUCLEATION OF THE PROSTATE WITH MORCELLATION N/A 03/19/2021   Procedure: HOLEP-LASER ENUCLEATION OF THE PROSTATE WITH MORCELLATION;  Surgeon: Sondra Come, MD;  Location: [facility];  Service: Urology;  Laterality: N/A;   INTERCOSTAL NERVE BLOCK Right 11/03/2022   Procedure: INTERCOSTAL NERVE BLOCK;  Surgeon: [name], MD;  Location: [facility];  Service: Thoracic;  Laterality: Right;   LYMPH NODE BIOPSY Right 11/03/2022   Procedure: LYMPH NODE BIOPSY;  Surgeon: [name], MD;  Location: [facility];  Service: Thoracic;  Laterality: Right;   PERIPHERAL VASCULAR CATHETERIZATION Right 05/21/2015   Procedure: Lower Extremity Angiography;  Surgeon: Annice Needy, MD;  Location: [facility];  Service: Cardiovascular;  Laterality: Right;   PERIPHERAL VASCULAR CATHETERIZATION  05/21/2015   Procedure: Lower Extremity Intervention;  Surgeon: Annice Needy, MD;  Location: [facility];  Service: Cardiovascular;;   radio-ablated therapy     completed in the pain clinic   RETINAL DETACHMENT SURGERY     on right eye x 2   Family History  Problem Relation Age of Onset   Hypertension Mother    Breast cancer Mother 60   Uterine cancer Mother        unconfirmed; dx 22s; TAH/BSO; currently 36  Hyperlipidemia Father    Bladder Cancer Paternal Grandfather 73       smoker; deceased 60   Diabetes Maternal Grandmother    Non-Hodgkin's lymphoma Paternal Aunt        deceased 81   Social History   Socioeconomic History   Marital status: Married    Spouse name: Not on file   Number of children: Not on file   Years of education: Not on file   Highest education level: Not on file  Occupational History   Not on file  Tobacco Use    Smoking status: Former    Current packs/day: 0.00    Average packs/day: 0.5 packs/day for 37.0 years (18.5 ttl pk-yrs)    Types: Cigarettes    Quit date: 09/02/2022    Years since quitting: 0.5    Passive exposure: Past   Smokeless tobacco: Never   Tobacco comments:    1 pack will last 2-3 days--08/15/2022  Vaping Use   Vaping status: Former  Substance and Sexual Activity   Alcohol use: Yes    Alcohol/week: 0.0 standard drinks of alcohol    Comment: very occasional alcohol use - 3-4x/yr   Drug use: No   Sexual activity: Yes  Other Topics Concern   Not on file  Social History Narrative   Not on file   Social Drivers of Health   Financial Resource Strain: Not on file  Food Insecurity: No Food Insecurity (08/24/2021)   Hunger Vital Sign    Worried About Running Out of Food in the Last Year: Never true    Ran Out of Food in the Last Year: Never true  Transportation Needs: No Transportation Needs (08/24/2021)   PRAPARE - Administrator, Civil Service (Medical): No    Lack of Transportation (Non-Medical): No  Physical Activity: Not on file  Stress: Not on file  Social Connections: Not on file     Review of Systems  Constitutional:  Positive for fatigue.       Lost weight when sick. Appetite is improving.   HENT:  Negative for sinus pressure.        No increased nasal congestion. No sore throat.   Respiratory:  Negative for chest tightness.        Cough and breathing improved.   Cardiovascular:  Negative for chest pain, palpitations and leg swelling.  Gastrointestinal:  Negative for vomiting.       Diarrhea better. No abdominal pain.   Genitourinary:  Negative for difficulty urinating and dysuria.  Musculoskeletal:  Negative for joint swelling.       Body aches improved.   Skin:  Negative for color change and rash.  Neurological:  Negative for dizziness and headaches.  Psychiatric/Behavioral:  Negative for agitation and dysphoric mood.        Objective:      BP 130/70   Pulse 70   Temp 97.8 F (36.6 C)   Resp 16   Ht 5\' 9"  (1.753 m)   Wt 168 lb (76.2 kg)   SpO2 97%   BMI 24.81 kg/m  Wt Readings from Last 3 Encounters:  03/08/23 168 lb (76.2 kg)  02/13/23 172 lb 12.8 oz (78.4 kg)  02/07/23 174 lb 12.8 oz (79.3 kg)    Physical Exam Vitals reviewed.  Constitutional:      General: He is not in acute distress.    Appearance: Normal appearance. He is well-developed.  HENT:     Head: Normocephalic and atraumatic.  Right Ear: External ear normal.     Left Ear: External ear normal.  Eyes:     General: No scleral icterus.       Right eye: No discharge.        Left eye: No discharge.     Conjunctiva/sclera: Conjunctivae normal.  Cardiovascular:     Rate and Rhythm: Normal rate and regular rhythm.  Pulmonary:     Effort: Pulmonary effort is normal. No respiratory distress.     Breath sounds: Normal breath sounds.  Abdominal:     General: Bowel sounds are normal.     Palpations: Abdomen is soft.     Tenderness: There is no abdominal tenderness.  Musculoskeletal:        General: No swelling or tenderness.     Cervical back: Neck supple. No tenderness.  Lymphadenopathy:     Cervical: No cervical adenopathy.  Skin:    Findings: No erythema or rash.  Neurological:     Mental Status: He is alert.  Psychiatric:        Mood and Affect: Mood normal.        Behavior: Behavior normal.         Outpatient Encounter Medications as of 03/08/2023  Medication Sig   albuterol (VENTOLIN HFA) 108 (90 Base) MCG/ACT inhaler Inhale 2 puffs into the lungs every 6 (six) hours as needed for wheezing or shortness of breath.   amitriptyline (ELAVIL) 25 MG tablet Take 1 tablet (25 mg total) by mouth at bedtime.   atorvastatin (LIPITOR) 20 MG tablet Take 1 tablet (20 mg total) by mouth at bedtime.   clopidogrel (PLAVIX) 75 MG tablet TAKE 1 TABLET BY MOUTH ONCE DAILY   DULoxetine (CYMBALTA) 30 MG capsule Take 1 capsule (30 mg total) by mouth  daily.   fenofibrate (TRICOR) 145 MG tablet Take 1 tablet (145 mg total) by mouth daily.   Fluticasone-Umeclidin-Vilant (TRELEGY ELLIPTA) 100-62.5-25 MCG/ACT AEPB Inhale 1 puff into the lungs daily.   losartan (COZAAR) 100 MG tablet Take 1 tablet (100 mg total) by mouth daily.   metoprolol succinate (TOPROL-XL) 25 MG 24 hr tablet TAKE 1 TABLET BY MOUTH ONCE DAILY   Omega-3 Fatty Acids (FISH OIL PO) Take 2 capsules by mouth in the morning.   pantoprazole (PROTONIX) 40 MG tablet Take 1 tablet (40 mg total) by mouth daily.   vitamin B-12 (CYANOCOBALAMIN) 1000 MCG tablet Take 1,000 mcg by mouth in the morning.   [DISCONTINUED] gabapentin (NEURONTIN) 300 MG capsule Take 1 capsule (300 mg total) by mouth daily.   No facility-administered encounter medications on file as of 03/08/2023.     Lab Results  Component Value Date   WBC 7.2 03/08/2023   HGB 13.2 03/08/2023   HCT 37.8 (L) 03/08/2023   PLT 346.0 03/08/2023   GLUCOSE 91 03/08/2023   CHOL 213 (H) 02/13/2023   TRIG (H) 02/13/2023    718.0 Triglyceride is over 400; calculations on Lipids are invalid.   HDL 31.60 (L) 02/13/2023   LDLDIRECT 63.0 02/13/2023   LDLCALC 60 04/24/2013   ALT 14 02/13/2023   AST 17 02/13/2023   NA 139 03/08/2023   K 3.6 03/08/2023   CL 100 03/08/2023   CREATININE 1.10 03/08/2023   BUN 15 03/08/2023   CO2 30 03/08/2023   TSH 3.28 06/13/2022   PSA 0.18 06/13/2022   INR 1.0 11/01/2022   HGBA1C 5.9 02/13/2023    DG Chest 2 View Result Date: 12/13/2022 CLINICAL DATA:  History of prior right  upper lobectomy EXAM: CHEST - 2 VIEW COMPARISON:  11/07/2022 FINDINGS: Cardiac shadow is within normal limits. The lungs are well aerated bilaterally. Postsurgical changes are noted in the right suprahilar region. No pneumothorax is seen. No bony abnormality is noted. IMPRESSION: No active cardiopulmonary disease. Electronically Signed   By: Alcide Clever [name]   On: 12/13/2022 19:02       Assessment & Plan:  Leukopenia,  unspecified type Assessment & Plan: Recheck cbc to confirm wbc count wnl.   Orders: -     CBC with Differential/Platelet  Hypokalemia -     Basic metabolic panel  Chronic obstructive pulmonary disease, unspecified COPD type (HCC) Assessment & Plan: Recently admitted overnight for influenza. Dehydrated. Hypoxic. Treated with tamiflu. Breathing better. Oxygen better. Continue trelegy. Has rescue inhaler if needed.    Essential hypertension, benign Assessment & Plan: On toprol and losartan. Follow pressures. Follow metabolic panel.    Gastroesophageal reflux disease, unspecified whether esophagitis present Assessment & Plan: No upper symptoms reported.  On protonix.    Influenza Assessment & Plan: Recently admitted with influenza as outlined. Had increased diarrhea and found to be hypoxic on presentation. Kept overnight. Treated with tamiflu. Appetite is better. Bowels better. Energy improving. Check cbc and met b today. Follow.       Dale Boaz, MD

## 2023-03-13 ENCOUNTER — Encounter: Payer: Self-pay | Admitting: Internal Medicine

## 2023-03-13 ENCOUNTER — Other Ambulatory Visit: Payer: Self-pay | Admitting: Internal Medicine

## 2023-03-13 DIAGNOSIS — J111 Influenza due to unidentified influenza virus with other respiratory manifestations: Secondary | ICD-10-CM | POA: Insufficient documentation

## 2023-03-13 NOTE — Assessment & Plan Note (Signed)
Recheck cbc to confirm wbc count wnl.

## 2023-03-13 NOTE — Assessment & Plan Note (Signed)
Recently admitted with influenza as outlined. Had increased diarrhea and found to be hypoxic on presentation. Kept overnight. Treated with tamiflu. Appetite is better. Bowels better. Energy improving. Check cbc and met b today. Follow.

## 2023-03-13 NOTE — Assessment & Plan Note (Signed)
No upper symptoms reported.  On protonix.   

## 2023-03-13 NOTE — Assessment & Plan Note (Signed)
Recently admitted overnight for influenza. Dehydrated. Hypoxic. Treated with tamiflu. Breathing better. Oxygen better. Continue trelegy. Has rescue inhaler if needed.

## 2023-03-13 NOTE — Assessment & Plan Note (Signed)
 On toprol and losartan. Follow pressures. Follow metabolic panel.

## 2023-04-06 ENCOUNTER — Other Ambulatory Visit: Payer: Self-pay | Admitting: Internal Medicine

## 2023-04-25 ENCOUNTER — Other Ambulatory Visit: Payer: Self-pay | Admitting: Internal Medicine

## 2023-05-09 ENCOUNTER — Ambulatory Visit: Admitting: Cardiovascular Disease

## 2023-05-23 DIAGNOSIS — H40003 Preglaucoma, unspecified, bilateral: Secondary | ICD-10-CM | POA: Diagnosis not present

## 2023-05-29 DIAGNOSIS — H40003 Preglaucoma, unspecified, bilateral: Secondary | ICD-10-CM | POA: Diagnosis not present

## 2023-05-29 DIAGNOSIS — H35371 Puckering of macula, right eye: Secondary | ICD-10-CM | POA: Diagnosis not present

## 2023-05-29 DIAGNOSIS — Z961 Presence of intraocular lens: Secondary | ICD-10-CM | POA: Diagnosis not present

## 2023-05-31 ENCOUNTER — Ambulatory Visit: Admitting: *Deleted

## 2023-05-31 ENCOUNTER — Telehealth: Payer: Self-pay | Admitting: Internal Medicine

## 2023-05-31 VITALS — Ht 69.0 in | Wt 175.0 lb

## 2023-05-31 DIAGNOSIS — Z0000 Encounter for general adult medical examination without abnormal findings: Secondary | ICD-10-CM

## 2023-05-31 NOTE — Telephone Encounter (Signed)
 Received a message from [place] regarding Travis Gray's colon cancer screening. His last colonoscopy was 2018. Per my records, is past due. Would like to refer back to GI for colonoscopy if agreeable. He last saw Dr Corky Diener. Is he ok with returning to see Dr Corky Diener for colonscopy?

## 2023-05-31 NOTE — Progress Notes (Signed)
 Subjective:   Travis Gray is a 68 y.o. who presents for a Medicare Wellness preventive visit.  Visit Complete: Virtual I connected with  Travis Gray on 05/31/23 by a audio enabled telemedicine application and verified that I am speaking with the correct person using two identifiers.  Patient Location: Home  Provider Location: Office/Clinic  I discussed the limitations of evaluation and management by telemedicine. The patient expressed understanding and agreed to proceed.  Vital Signs: Because this visit was a virtual/telehealth visit, some criteria may be missing or patient reported. Any vitals not documented were not able to be obtained and vitals that have been documented are patient reported.  VideoDeclined- This patient declined Librarian, academic. Therefore the visit was completed with audio only.  Persons Participating in Visit: Patient.  AWV Questionnaire: No: Patient Medicare AWV questionnaire was not completed prior to this visit.  Cardiac Risk Factors include: advanced age (>61men, >33 women);male gender;hypertension;dyslipidemia     Objective:    Today's Vitals   05/31/23 1056  Weight: 175 lb (79.4 kg)  Height: 5\' 9"  (1.753 m)   Body mass index is 25.84 kg/m.     05/31/2023   11:11 AM 11/01/2022   10:00 AM 09/19/2022   10:44 AM 03/19/2021    6:25 AM 03/16/2021    1:23 PM 02/16/2021    5:28 PM 04/11/2018    7:51 AM  Advanced Directives  Does Patient Have a Medical Advance Directive? No No No No No No No  Would patient like information on creating a medical advance directive? No - Patient declined No - Patient declined No - Patient declined No - Patient declined  No - Patient declined No - Patient declined    Current Medications (verified) Outpatient Encounter Medications as of 05/31/2023  Medication Sig   albuterol  (VENTOLIN  HFA) 108 (90 Base) MCG/ACT inhaler Inhale 2 puffs into the lungs every 6 (six) hours as needed for  wheezing or shortness of breath.   amitriptyline  (ELAVIL ) 25 MG tablet Take 1 tablet (25 mg total) by mouth at bedtime.   atorvastatin  (LIPITOR) 20 MG tablet Take 1 tablet (20 mg total) by mouth at bedtime.   clopidogrel  (PLAVIX ) 75 MG tablet TAKE 1 TABLET BY MOUTH ONCE DAILY   DULoxetine  (CYMBALTA ) 30 MG capsule Take 1 capsule (30 mg total) by mouth daily.   fenofibrate  (TRICOR ) 145 MG tablet Take 1 tablet (145 mg total) by mouth daily.   Fluticasone-Umeclidin-Vilant (TRELEGY ELLIPTA ) 100-62.5-25 MCG/ACT AEPB Inhale 1 puff into the lungs daily.   hydrochlorothiazide  (HYDRODIURIL ) 25 MG tablet TAKE 1 TABLET BY MOUTH ONCE DAILY   losartan  (COZAAR ) 100 MG tablet Take 1 tablet (100 mg total) by mouth daily.   metoprolol  succinate (TOPROL -XL) 25 MG 24 hr tablet TAKE 1 TABLET BY MOUTH ONCE DAILY   Omega-3 Fatty Acids (FISH OIL PO) Take 2 capsules by mouth in the morning.   pantoprazole  (PROTONIX ) 40 MG tablet Take 1 tablet (40 mg total) by mouth daily.   vitamin B-12 (CYANOCOBALAMIN ) 1000 MCG tablet Take 1,000 mcg by mouth in the morning.   No facility-administered encounter medications on file as of 05/31/2023.    Allergies (verified) Patient has no known allergies.   History: Past Medical History:  Diagnosis Date   Chronic headaches    migraines, 3-4x/yr   Degenerative disc disease, cervical    Genetic testing 03/20/2017   Multi-Cancer panel (83 genes) @ [facility] - No pathogenic mutations detected   GERD (gastroesophageal reflux  disease)    Hypercholesterolemia    Hypertension    Myocardial infarction [facility])    Old Infarct based on Stress test results in 2024   Obesity    Peripheral vascular disease (HCC)    groin and right leg stents   Skin cancer    arm/ face area.  Has had removed   Sleep apnea    CPAP   Tobacco abuse    Past Surgical History:  Procedure Laterality Date   CARDIAC CATHETERIZATION     CATARACT EXTRACTION W/PHACO Left 04/11/2018   Procedure: CATARACT EXTRACTION  PHACO AND INTRAOCULAR LENS PLACEMENT (IOC)  COMPLICATED LEFT;  Surgeon: Annell Kidney, MD;  Location: [facility];  Service: Ophthalmology;  Laterality: Left;  OMIDRIA  sleep apnea   COLONOSCOPY WITH PROPOFOL  N/A 11/25/2016   Procedure: COLONOSCOPY WITH PROPOFOL ;  Surgeon: Toledo, Alphonsus Jeans, MD;  Location: [facility] ENDOSCOPY;  Service: Endoscopy;  Laterality: N/A;   ESOPHAGOGASTRODUODENOSCOPY (EGD) WITH PROPOFOL  N/A 11/25/2016   Procedure: ESOPHAGOGASTRODUODENOSCOPY (EGD) WITH PROPOFOL ;  Surgeon: Toledo, Alphonsus Jeans, MD;  Location: [facility] ENDOSCOPY;  Service: Endoscopy;  Laterality: N/A;   HERNIA REPAIR     Inguinal,laparoscopic surgery   HOLEP-LASER ENUCLEATION OF THE PROSTATE WITH MORCELLATION N/A 03/19/2021   Procedure: HOLEP-LASER ENUCLEATION OF THE PROSTATE WITH MORCELLATION;  Surgeon: Lawerence Pressman, MD;  Location: [facility] ORS;  Service: Urology;  Laterality: N/A;   INTERCOSTAL NERVE BLOCK Right 11/03/2022   Procedure: INTERCOSTAL NERVE BLOCK;  Surgeon: Hilarie Lovely, MD;  Location: [facility] OR;  Service: Thoracic;  Laterality: Right;   LYMPH NODE BIOPSY Right 11/03/2022   Procedure: LYMPH NODE BIOPSY;  Surgeon: Hilarie Lovely, MD;  Location: [facility] OR;  Service: Thoracic;  Laterality: Right;   PERIPHERAL VASCULAR CATHETERIZATION Right 05/21/2015   Procedure: Lower Extremity Angiography;  Surgeon: Celso College, MD;  Location: [facility] INV[facility] LAB;  Service: Cardiovascular;  Laterality: Right;   PERIPHERAL VASCULAR CATHETERIZATION  05/21/2015   Procedure: Lower Extremity Intervention;  Surgeon: Celso College, MD;  Location: [facility] INVAS[facility] CV LAB;  Service: Cardiovascular;;   radio-ablated therapy     completed in the pain clinic   RETINAL DETACHMENT SURGERY     on right eye x 2   Family History  Problem Relation Age of Onset   Hypertension Mother    Breast cancer Mother 87   Uterine cancer Mother        unconfirmed; dx 7s; TAH/BSO; currently 75   Hyperlipidemia Father     Bladder Cancer Paternal Grandfather 23       smoker; deceased 67   Diabetes Maternal Grandmother    Non-Hodgkin's lymphoma Paternal Aunt        deceased 81   Social History   Socioeconomic History   Marital status: Married    Spouse name: Not on file   Number of children: Not on file   Years of education: Not on file   Highest education level: Not on file  Occupational History   Not on file  Tobacco Use   Smoking status: Former    Current packs/day: 0.00    Average packs/day: 0.5 packs/day for 37.0 years (18.5 ttl pk-yrs)    Types: Cigarettes    Quit date: 09/02/2022    Years since quitting: 0.7    Passive exposure: Past   Smokeless tobacco: Never   Tobacco comments:    1 pack will last 2-3 days--08/15/2022  Vaping Use   Vaping status: Former  Substance and Sexual Activity   Alcohol  use: Yes    Alcohol/week: 0.0 standard drinks of alcohol    Comment: very occasional alcohol use - 3-4x/yr   Drug use: No   Sexual activity: Yes  Other Topics Concern   Not on file  Social History Narrative   Married   Social Drivers of Health   Financial Resource Strain: Low Risk  (05/31/2023)   Overall Financial Resource Strain (CARDIA)    Difficulty of Paying Living Expenses: Not hard at all  Food Insecurity: No Food Insecurity (05/31/2023)   Hunger Vital Sign    Worried About Running Out of Food in the Last Year: Never true    Ran Out of Food in the Last Year: Never true  Transportation Needs: No Transportation Needs (05/31/2023)   PRAPARE - Administrator, Civil Service (Medical): No    Lack of Transportation (Non-Medical): No  Physical Activity: Inactive (05/31/2023)   Exercise Vital Sign    Days of Exercise per Week: 0 days    Minutes of Exercise per Session: 0 min  Stress: No Stress Concern Present (05/31/2023)   [employer] of Occupational Health - Occupational Stress Questionnaire    Feeling of Stress : Not at all  Social Connections: Moderately Integrated  (05/31/2023)   Social Connection and Isolation Panel [NHANES]    Frequency of Communication with Friends and Family: More than three times a week    Frequency of Social Gatherings with Friends and Family: More than three times a week    Attends Religious Services: More than 4 times per year    Active Member of [employer] or Organizations: No    Attends Engineer, structural: Never    Marital Status: Married    Tobacco Counseling Counseling given: Not Answered Tobacco comments: 1 pack will last 2-3 days--08/15/2022    Clinical Intake:  Pre-visit preparation completed: Yes  Pain : No/denies pain     BMI - recorded: 25.84 Nutritional Status: BMI 25 -29 Overweight Nutritional Risks: None Diabetes: No  Lab Results  Component Value Date   HGBA1C 5.9 02/13/2023   HGBA1C 5.8 10/12/2022   HGBA1C 5.6 06/13/2022     How often do you need to have someone help you when you read instructions, pamphlets, or other written materials from your doctor or pharmacy?: 1 - Never  Interpreter Needed?: No  Information entered by :: R. Kairi Tufo LPN   Activities of Daily Living     05/31/2023   10:58 AM 11/01/2022   10:02 AM  In your present state of health, do you have any difficulty performing the following activities:  Hearing? 0   Vision? 1   Comment readers, poor vision in right eye   Difficulty concentrating or making decisions? 0   Walking or climbing stairs? 1   Dressing or bathing? 0   Doing errands, shopping? 0 0  Preparing Food and eating ? N   Using the Toilet? N   In the past six months, have you accidently leaked urine? N   Do you have problems with loss of bowel control? N   Managing your Medications? N   Managing your Finances? N   Housekeeping or managing your Housekeeping? Y     Patient Care Team: Dellar Fenton, MD as PCP - General (Internal Medicine) Green, Davina E, RN as [facility] Care Management  Indicate any recent Medical Services you may  have received from other than [facility] providers in the past year (date may be approximate).  Assessment:   This is a routine wellness examination for Travis Gray.  Hearing/Vision screen Hearing Screening - Comments:: No issues Vision Screening - Comments:: Readers, poor vision in right eye, history of retinal detachment   Goals Addressed             This Visit's Progress    Patient Stated       Wants to stay active        Depression Screen     05/31/2023   11:06 AM 10/12/2022    8:27 AM 10/08/2021    3:30 PM 05/13/2021    8:02 AM 02/10/2021    8:11 AM 01/09/2020    2:11 PM 10/08/2019    4:39 PM  PHQ 2/9 Scores  PHQ - 2 Score 0 0 0 0 0 0 0  PHQ- 9 Score 0          Fall Risk     05/31/2023   11:01 AM 10/12/2022    8:27 AM 10/08/2021    3:30 PM 05/13/2021    8:02 AM 02/10/2021    8:11 AM  Fall Risk   Falls in the past year? 0 0 0 0 0  Number falls in past yr: 0 0 0    Injury with Fall? 0 0 0    Risk for fall due to : No Fall Risks No Fall Risks No Fall Risks No Fall Risks No Fall Risks  Follow up Falls prevention discussed;Falls evaluation completed Falls evaluation completed Falls evaluation completed Falls evaluation completed Falls evaluation completed    MEDICARE RISK AT HOME:  Medicare Risk at Home Any stairs in or around the home?: No If so, are there any without handrails?: No Home free of loose throw rugs in walkways, pet beds, electrical cords, etc?: Yes Adequate lighting in your home to reduce risk of falls?: Yes Life alert?: No Use of a cane, walker or w/c?: No Grab bars in the bathroom?: No Shower chair or bench in shower?: Yes Elevated toilet seat or a handicapped toilet?: Yes  TIMED UP AND GO:  Was the test performed?  No  Cognitive Function: 6CIT completed        05/31/2023   11:11 AM  6CIT Screen  What Year? 0 points  What month? 0 points  What time? 0 points  Count back from 20 0 points  Months in reverse 0 points  Repeat phrase 0 points   Total Score 0 points    Immunizations Immunization History  Administered Date(s) Administered   Influenza,inj,Quad PF,6+ Mos 10/26/2017    Screening Tests Health Maintenance  Topic Date Due   Medicare Annual Wellness (AWV)  Never done   COVID-19 Vaccine (1) Never done   Hepatitis C Screening  Never done   DTaP/Tdap/Td (1 - Tdap) Never done   Pneumonia Vaccine 16+ Years old (1 of 2 - PCV) Never done   Zoster Vaccines- Shingrix (1 of 2) Never done   INFLUENZA VACCINE  08/25/2023   Colonoscopy  11/26/2026   HPV VACCINES  Aged Out   Meningococcal B Vaccine  Aged Out    Health Maintenance  Health Maintenance Due  Topic Date Due   Medicare Annual Wellness (AWV)  Never done   COVID-19 Vaccine (1) Never done   Hepatitis C Screening  Never done   DTaP/Tdap/Td (1 - Tdap) Never done   Pneumonia Vaccine 43+ Years old (1 of 2 - PCV) Never done   Zoster Vaccines- Shingrix (1 of 2) Never done  Health Maintenance Items Addressed: Patient declines vaccines and does not think  Hepatitis C screening is necessary.  Additional Screening:  Vision Screening: Recommended annual ophthalmology exams for early detection of glaucoma and other disorders of the eye. Up to date [facility]  Dental Screening: Recommended annual dental exams for proper oral hygiene  Community Resource Referral / Chronic Care Management: CRR required this visit?  No   CCM required this visit?  No     Plan:     I have personally reviewed and noted the following in the patient's chart:   Medical and social history Use of alcohol, tobacco or illicit drugs  Current medications and supplements including opioid prescriptions. Patient is not currently taking opioid prescriptions. Functional ability and status Nutritional status Physical activity Advanced directives List of other physicians Hospitalizations, surgeries, and ER visits in previous 12 months Vitals Screenings to include cognitive, depression,  and falls Referrals and appointments  In addition, I have reviewed and discussed with patient certain preventive protocols, quality metrics, and best practice recommendations. A written personalized care plan for preventive services as well as general preventive health recommendations were provided to patient.     Felicitas Horse, LPN   01/29/1094   After Visit Summary: (MyChart) Due to this being a telephonic visit, the after visit summary with patients personalized plan was offered to patient via MyChart   Notes: Please refer to Routing Comments.

## 2023-05-31 NOTE — Patient Instructions (Signed)
 Mr. Travis Gray , Thank you for taking time to come for your Medicare Wellness Visit. I appreciate your ongoing commitment to your health goals. Please review the following plan we discussed and let me know if I can assist you in the future.   Referrals/Orders/Follow-Ups/Clinician Recommendations: Consider updating your vaccines.  This is a list of the screening recommended for you and due dates:  Health Maintenance  Topic Date Due   COVID-19 Vaccine (1) Never done   Hepatitis C Screening  Never done   DTaP/Tdap/Td vaccine (1 - Tdap) Never done   Pneumonia Vaccine (1 of 2 - PCV) Never done   Zoster (Shingles) Vaccine (1 of 2) Never done   Flu Shot  08/25/2023   Medicare Annual Wellness Visit  05/30/2024   Colon Cancer Screening  11/26/2026   HPV Vaccine  Aged Out   Meningitis B Vaccine  Aged Out    Advanced directives: (Declined) Advance directive discussed with you today. Even though you declined this today, please call our office should you change your mind, and we can give you the proper paperwork for you to fill out.  Next Medicare Annual Wellness Visit scheduled for next year: Yes 06/05/24 @ 8:50  Have you seen your provider in the last 6 months (3 months if uncontrolled diabetes)?   Yes 03/08/23

## 2023-06-01 NOTE — Telephone Encounter (Signed)
 Noted on schedule to discuss.

## 2023-06-02 ENCOUNTER — Ambulatory Visit

## 2023-06-08 ENCOUNTER — Other Ambulatory Visit: Payer: Self-pay

## 2023-06-08 DIAGNOSIS — E7800 Pure hypercholesterolemia, unspecified: Secondary | ICD-10-CM

## 2023-06-08 DIAGNOSIS — R739 Hyperglycemia, unspecified: Secondary | ICD-10-CM

## 2023-06-12 ENCOUNTER — Other Ambulatory Visit (INDEPENDENT_AMBULATORY_CARE_PROVIDER_SITE_OTHER): Payer: HMO

## 2023-06-12 DIAGNOSIS — E7800 Pure hypercholesterolemia, unspecified: Secondary | ICD-10-CM | POA: Diagnosis not present

## 2023-06-12 DIAGNOSIS — R739 Hyperglycemia, unspecified: Secondary | ICD-10-CM

## 2023-06-12 LAB — BASIC METABOLIC PANEL WITH GFR
BUN: 15 mg/dL (ref 6–23)
CO2: 28 meq/L (ref 19–32)
Calcium: 8.6 mg/dL (ref 8.4–10.5)
Chloride: 103 meq/L (ref 96–112)
Creatinine, Ser: 1.09 mg/dL (ref 0.40–1.50)
GFR: 70.93 mL/min (ref >60.00)
Glucose, Bld: 100 mg/dL — ABNORMAL HIGH (ref 70–99)
Potassium: 3.7 meq/L (ref 3.5–5.1)
Sodium: 140 meq/L (ref 135–145)

## 2023-06-12 LAB — HEPATIC FUNCTION PANEL
ALT: 13 U/L (ref 0–53)
AST: 16 U/L (ref 0–37)
Albumin: 4.3 g/dL (ref 3.5–5.2)
Alkaline Phosphatase: 62 U/L (ref 39–117)
Bilirubin, Direct: 0.1 mg/dL (ref 0.0–0.3)
Total Bilirubin: 0.5 mg/dL (ref 0.2–1.2)
Total Protein: 6.5 g/dL (ref 6.0–8.3)

## 2023-06-12 LAB — LIPID PANEL
Cholesterol: 172 mg/dL (ref 0–200)
HDL: 25.3 mg/dL — ABNORMAL LOW (ref >39.00)
NonHDL: 146.52
Total CHOL/HDL Ratio: 7
Triglycerides: 611 mg/dL — ABNORMAL HIGH (ref 0.0–149.0)
VLDL: 122.2 mg/dL — ABNORMAL HIGH (ref 0.0–40.0)

## 2023-06-12 LAB — LDL CHOLESTEROL, DIRECT: Direct LDL: 53 mg/dL

## 2023-06-12 LAB — HEMOGLOBIN A1C: Hgb A1c MFr Bld: 5.6 % (ref 4.6–6.5)

## 2023-06-13 ENCOUNTER — Encounter (INDEPENDENT_AMBULATORY_CARE_PROVIDER_SITE_OTHER): Payer: Self-pay

## 2023-06-13 LAB — TSH: TSH: 3 u[IU]/mL (ref 0.35–5.50)

## 2023-06-14 ENCOUNTER — Ambulatory Visit: Payer: Self-pay | Admitting: Internal Medicine

## 2023-06-14 ENCOUNTER — Encounter: Payer: Self-pay | Admitting: Internal Medicine

## 2023-06-14 ENCOUNTER — Ambulatory Visit (INDEPENDENT_AMBULATORY_CARE_PROVIDER_SITE_OTHER)

## 2023-06-14 ENCOUNTER — Ambulatory Visit (INDEPENDENT_AMBULATORY_CARE_PROVIDER_SITE_OTHER): Payer: HMO | Admitting: Internal Medicine

## 2023-06-14 VITALS — BP 128/68 | HR 60 | Temp 97.9°F | Resp 16 | Ht 69.0 in | Wt 174.0 lb

## 2023-06-14 DIAGNOSIS — I700 Atherosclerosis of aorta: Secondary | ICD-10-CM

## 2023-06-14 DIAGNOSIS — M545 Low back pain, unspecified: Secondary | ICD-10-CM | POA: Diagnosis not present

## 2023-06-14 DIAGNOSIS — R911 Solitary pulmonary nodule: Secondary | ICD-10-CM | POA: Diagnosis not present

## 2023-06-14 DIAGNOSIS — M5126 Other intervertebral disc displacement, lumbar region: Secondary | ICD-10-CM | POA: Diagnosis not present

## 2023-06-14 DIAGNOSIS — E7800 Pure hypercholesterolemia, unspecified: Secondary | ICD-10-CM | POA: Diagnosis not present

## 2023-06-14 DIAGNOSIS — J449 Chronic obstructive pulmonary disease, unspecified: Secondary | ICD-10-CM | POA: Diagnosis not present

## 2023-06-14 DIAGNOSIS — I10 Essential (primary) hypertension: Secondary | ICD-10-CM | POA: Diagnosis not present

## 2023-06-14 DIAGNOSIS — I2510 Atherosclerotic heart disease of native coronary artery without angina pectoris: Secondary | ICD-10-CM

## 2023-06-14 DIAGNOSIS — I739 Peripheral vascular disease, unspecified: Secondary | ICD-10-CM

## 2023-06-14 DIAGNOSIS — M47816 Spondylosis without myelopathy or radiculopathy, lumbar region: Secondary | ICD-10-CM | POA: Diagnosis not present

## 2023-06-14 DIAGNOSIS — M25561 Pain in right knee: Secondary | ICD-10-CM | POA: Diagnosis not present

## 2023-06-14 DIAGNOSIS — M5136 Other intervertebral disc degeneration, lumbar region with discogenic back pain only: Secondary | ICD-10-CM | POA: Diagnosis not present

## 2023-06-14 DIAGNOSIS — K219 Gastro-esophageal reflux disease without esophagitis: Secondary | ICD-10-CM

## 2023-06-14 DIAGNOSIS — R739 Hyperglycemia, unspecified: Secondary | ICD-10-CM | POA: Diagnosis not present

## 2023-06-14 MED ORDER — FENOFIBRATE 145 MG PO TABS: 145.0000 mg | ORAL_TABLET | Freq: Every day | ORAL | 2 refills | Status: DC

## 2023-06-14 MED ORDER — HYDROCHLOROTHIAZIDE 25 MG PO TABS: 25.0000 mg | ORAL_TABLET | Freq: Every day | ORAL | 1 refills | Status: DC

## 2023-06-14 MED ORDER — TIZANIDINE HCL 4 MG PO TABS: 4.0000 mg | ORAL_TABLET | Freq: Every evening | ORAL | 0 refills | Status: DC | PRN

## 2023-06-14 NOTE — Progress Notes (Unsigned)
 Subjective:    Patient ID: Travis Gray, male    DOB: [birth year], 68 y.o.   MRN: [id number]  Patient here for  Chief Complaint  Patient presents with  . Medical Management of Chronic Issues    HPI Here for a scheduled follow up - follow up regarding hypercholesterolemia, hypertension, PAD, COPD/ashtma.  Saw pulmonary 10/2022 - recommended trelegy. Had f/u with [facility] 02/07/23 - ABIs reviewed - stable, recommended continuing plavix  and statin.   Needs f/u with pulmonary   Past Medical History:  Diagnosis Date  . Chronic headaches    migraines, 3-4x/yr  . Degenerative disc disease, cervical   . Genetic testing 03/20/2017   Multi-Cancer panel (83 genes) @ [facility] - No pathogenic mutations detected  . GERD (gastroesophageal reflux disease)   . Hypercholesterolemia   . Hypertension   . Myocardial infarction [facility])    Old Infarct based on Stress test results in 2024  . Obesity   . Peripheral vascular disease (HCC)    groin and right leg stents  . Skin cancer    arm/ face area.  Has had removed  . Sleep apnea    CPAP  . Tobacco abuse    Past Surgical History:  Procedure Laterality Date  . CARDIAC CATHETERIZATION    . CATARACT EXTRACTION W/PHACO Left 04/11/2018   Procedure: CATARACT EXTRACTION PHACO AND INTRAOCULAR LENS PLACEMENT (IOC)  COMPLICATED LEFT;  Surgeon: Annell Kidney, MD;  Location: [facility];  Service: Ophthalmology;  Laterality: Left;  OMIDRIA  sleep apnea  . COLONOSCOPY WITH PROPOFOL  N/A 11/25/2016   Procedure: COLONOSCOPY WITH PROPOFOL ;  Surgeon: Toledo, Alphonsus Jeans, MD;  Location: [facility] END[facility]COPY;  Service: Endoscopy;  Laterality: N/A;  . ESOPHAGOGASTRODUODENOSCOPY (EGD) WITH PROPOFOL  N/A 11/25/2016   Procedure: ESOPHAGOGASTRODUODENOSCOPY (EGD) WITH PROPOFOL ;  Surgeon: Toledo, Alphonsus Jeans, MD;  Location: [facility] END[facility];  Service: Endoscopy;  Laterality: N/A;  . HERNIA REPAIR     Inguinal,laparoscopic surgery  . HOLEP-LASER ENUCLEATION OF THE  PROSTATE WITH MORCELLATION N/A 03/19/2021   Procedure: HOLEP-LASER ENUCLEATION OF THE PROSTATE WITH MORCELLATION;  Surgeon: Lawerence Pressman, MD;  Location: [facility] ORS;  Service: Urology;  Laterality: N/A;  . INTERCOSTAL NERVE BLOCK Right 11/03/2022   Procedure: INTERCOSTAL NERVE BLOCK;  Surgeon: Hilarie Lovely, MD;  Location: [facility];  Service: Thoracic;  Laterality: Right;  . LYMPH NODE BIOPSY Right 11/03/2022   Procedure: LYMPH NODE BIOPSY;  Surgeon: Hilarie Lovely, MD;  Location: [facility];  Service: Thoracic;  Laterality: Right;  . PERIPHERAL VASCULAR CATHETERIZATION Right 05/21/2015   Procedure: Lower Extremity Angiography;  Surgeon: Celso College, MD;  Location: [facility];  Service: Cardiovascular;  Laterality: Right;  . PERIPHERAL VASCULAR CATHETERIZATION  05/21/2015   Procedure: Lower Extremity Intervention;  Surgeon: Celso College, MD;  Location: [facility];  Service: Cardiovascular;;  . radio-ablated therapy     completed in the pain clinic  . RETINAL DETACHMENT SURGERY     on right eye x 2   Family History  Problem Relation Age of Onset  . Hypertension Mother   . Breast cancer Mother 44  . Uterine cancer Mother        unconfirmed; dx 5s; TAH/BSO; currently 81  . Hyperlipidemia Father   . Bladder Cancer Paternal Grandfather 64       smoker; deceased 8  . Diabetes Maternal Grandmother   . Non-Hodgkin's lymphoma Paternal Aunt        deceased 76   Social History  Socioeconomic History  . Marital status: Married    Spouse name: Not on file  . Number of children: Not on file  . Years of education: Not on file  . Highest education level: Not on file  Occupational History  . Not on file  Tobacco Use  . Smoking status: Former    Current packs/day: 0.00    Average packs/day: 0.5 packs/day for 37.0 years (18.5 ttl pk-yrs)    Types: Cigarettes    Quit date: 09/02/2022    Years since quitting: 0.7    Passive exposure: Past  . Smokeless tobacco: Never   . Tobacco comments:    1 pack will last 2-3 days--08/15/2022  Vaping Use  . Vaping status: Former  Substance and Sexual Activity  . Alcohol use: Yes    Alcohol/week: 0.0 standard drinks of alcohol    Comment: very occasional alcohol use - 3-4x/yr  . Drug use: No  . Sexual activity: Yes  Other Topics Concern  . Not on file  Social History Narrative   Married   Social Drivers of Health   Financial Resource Strain: Low Risk  (05/31/2023)   Overall Financial Resource Strain (CARDIA)   . Difficulty of Paying Living Expenses: Not hard at all  Food Insecurity: No Food Insecurity (05/31/2023)   Hunger Vital Sign   . Worried About Programme researcher, broadcasting/film/video in the Last Year: Never true   . Ran Out of Food in the Last Year: Never true  Transportation Needs: No Transportation Needs (05/31/2023)   PRAPARE - Transportation   . Lack of Transportation (Medical): No   . Lack of Transportation (Non-Medical): No  Physical Activity: Inactive (05/31/2023)   Exercise Vital Sign   . Days of Exercise per Week: 0 days   . Minutes of Exercise per Session: 0 min  Stress: No Stress Concern Present (05/31/2023)   [employer] of Occupational Health - Occupational Stress Questionnaire   . Feeling of Stress : Not at all  Social Connections: Moderately Integrated (05/31/2023)   Social Connection and Isolation Panel [NHANES]   . Frequency of Communication with Friends and Family: More than three times a week   . Frequency of Social Gatherings with Friends and Family: More than three times a week   . Attends Religious Services: More than 4 times per year   . Active Member of Clubs or Organizations: No   . Attends Banker Meetings: Never   . Marital Status: Married     Review of Systems     Objective:     BP 128/68   Pulse 60   Temp 97.9 F (36.6 C)   Resp 16   Ht 5\' 9"  (1.753 m)   Wt 174 lb (78.9 kg)   SpO2 98%   BMI 25.70 kg/m  Wt Readings from Last 3 Encounters:  06/14/23 174 lb  (78.9 kg)  05/31/23 175 lb (79.4 kg)  03/08/23 168 lb (76.2 kg)    Physical Exam  {Perform Simple Foot Exam  Perform Detailed exam:1} {Insert foot Exam (Optional):30965}   Outpatient Encounter Medications as of 06/14/2023  Medication Sig  . albuterol  (VENTOLIN  HFA) 108 (90 Base) MCG/ACT inhaler Inhale 2 puffs into the lungs every 6 (six) hours as needed for wheezing or shortness of breath.  . amitriptyline  (ELAVIL ) 25 MG tablet Take 1 tablet (25 mg total) by mouth at bedtime.  . atorvastatin  (LIPITOR) 20 MG tablet Take 1 tablet (20 mg total) by mouth at bedtime.  . clopidogrel  (  PLAVIX ) 75 MG tablet TAKE 1 TABLET BY MOUTH ONCE DAILY  . DULoxetine  (CYMBALTA ) 30 MG capsule Take 1 capsule (30 mg total) by mouth daily.  . Fluticasone-Umeclidin-Vilant (TRELEGY ELLIPTA ) 100-62.5-25 MCG/ACT AEPB Inhale 1 puff into the lungs daily.  . hydrochlorothiazide  (HYDRODIURIL ) 25 MG tablet TAKE 1 TABLET BY MOUTH ONCE DAILY  . losartan  (COZAAR ) 100 MG tablet Take 1 tablet (100 mg total) by mouth daily.  . metoprolol  succinate (TOPROL -XL) 25 MG 24 hr tablet TAKE 1 TABLET BY MOUTH ONCE DAILY  . Omega-3 Fatty Acids (FISH OIL PO) Take 2 capsules by mouth in the morning.  . pantoprazole  (PROTONIX ) 40 MG tablet Take 1 tablet (40 mg total) by mouth daily.  . vitamin B-12 (CYANOCOBALAMIN ) 1000 MCG tablet Take 1,000 mcg by mouth in the morning.  . fenofibrate  (TRICOR ) 145 MG tablet Take 1 tablet (145 mg total) by mouth daily. (Patient not taking: Reported on 06/14/2023)   No facility-administered encounter medications on file as of 06/14/2023.     Lab Results  Component Value Date   WBC 7.2 03/08/2023   HGB 13.2 03/08/2023   HCT 37.8 (L) 03/08/2023   PLT 346.0 03/08/2023   GLUCOSE 100 (H) 06/12/2023   CHOL 172 06/12/2023   TRIG (H) 06/12/2023    611.0 Triglyceride is over 400; calculations on Lipids are invalid.   HDL 25.30 (L) 06/12/2023   LDLDIRECT 53.0 06/12/2023   LDLCALC 60 04/24/2013   ALT 13  06/12/2023   AST 16 06/12/2023   NA 140 06/12/2023   K 3.7 06/12/2023   CL 103 06/12/2023   CREATININE 1.09 06/12/2023   BUN 15 06/12/2023   CO2 28 06/12/2023   TSH 3.00 06/12/2023   PSA 0.18 06/13/2022   INR 1.0 11/01/2022   HGBA1C 5.6 06/12/2023    DG Chest 2 View Result Date: 12/13/2022 CLINICAL DATA:  History of prior right upper lobectomy EXAM: CHEST - 2 VIEW COMPARISON:  11/07/2022 FINDINGS: Cardiac shadow is within normal limits. The lungs are well aerated bilaterally. Postsurgical changes are noted in the right suprahilar region. No pneumothorax is seen. No bony abnormality is noted. IMPRESSION: No active cardiopulmonary disease. Electronically Signed   By: Violeta Grey [name]   On: 12/13/2022 19:02       Assessment & Plan:  Hypercholesterolemia     Dellar Fenton, MD

## 2023-06-16 ENCOUNTER — Ambulatory Visit: Payer: Self-pay | Admitting: Internal Medicine

## 2023-06-17 ENCOUNTER — Other Ambulatory Visit: Payer: Self-pay | Admitting: Internal Medicine

## 2023-06-17 DIAGNOSIS — M545 Low back pain, unspecified: Secondary | ICD-10-CM

## 2023-06-17 DIAGNOSIS — M25561 Pain in right knee: Secondary | ICD-10-CM

## 2023-06-17 NOTE — Progress Notes (Signed)
 Order placed for PT referral and for ortho referral.

## 2023-06-19 ENCOUNTER — Encounter: Payer: Self-pay | Admitting: Internal Medicine

## 2023-06-19 DIAGNOSIS — M25561 Pain in right knee: Secondary | ICD-10-CM | POA: Insufficient documentation

## 2023-06-19 NOTE — Assessment & Plan Note (Signed)
 Continue lipitor  ?

## 2023-06-19 NOTE — Assessment & Plan Note (Signed)
 Low back pain as outlined. Persistent. Check L-s spine xray. Consider PT. Follow. Skelaxin as directed. Discussed not driving or operating machinery if taking muscle relaxer.

## 2023-06-19 NOTE — Assessment & Plan Note (Signed)
 Right knee pain - as outlined. Consider ortho referral.

## 2023-06-19 NOTE — Assessment & Plan Note (Signed)
 Admitted 11/03/22 - 11/07/22 - He underwent robotic assisted right upper lobectomy. Pathology was negative for malignancy, but there was a background of interstitial lung disease. Had f/u with pulmonary 11/15/22 - recommended trelegy. Breathing stable. Not smoking.

## 2023-06-19 NOTE — Assessment & Plan Note (Signed)
 Trelegy. Albuterol  prn. Has seen pulmonary. Breathing stable. Has stopped smoking.

## 2023-06-19 NOTE — Assessment & Plan Note (Signed)
 On toprol  and losartan . Follow pressures. Follow metabolic panel. No changes in medication today.

## 2023-06-19 NOTE — Assessment & Plan Note (Signed)
 Low-carb diet and exercise.  Follow met b and A1c.

## 2023-06-19 NOTE — Assessment & Plan Note (Signed)
 Continue lipitor.  Reviewed recent lipid panel. Triglycerides elevated. Start tricor . Low carb diet and exercise. Follow lipid panel.  Lab Results  Component Value Date   CHOL 172 06/12/2023   HDL 25.30 (L) 06/12/2023   LDLCALC 60 04/24/2013   LDLDIRECT 53.0 06/12/2023   TRIG (H) 06/12/2023    611.0 Triglyceride is over 400; calculations on Lipids are invalid.   CHOLHDL 7 06/12/2023

## 2023-06-19 NOTE — Assessment & Plan Note (Signed)
 Coronary calcium  score of 61. This was 47th percentile for age, sex, and race matched control. 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 There is a retro-aortic course with only one high risk feature: Acute angle take off. CAD-RADS 2. Mild non-obstructive CAD (25-49%). Continue risk factor modification. Denies chest pain or sob.

## 2023-06-19 NOTE — Assessment & Plan Note (Signed)
No upper symptoms reported.  Protonix.  

## 2023-06-19 NOTE — Assessment & Plan Note (Signed)
 F/u 02/07/23 - His ABIs today are 1.01 on the right and 1.07 on the left with triphasic waveforms and normal digital pressures bilaterally. Doing well after revascularization several years ago. Continue Plavix  and statin agent. Recheck on an annual basis.

## 2023-06-20 ENCOUNTER — Encounter: Payer: Self-pay | Admitting: Internal Medicine

## 2023-07-11 DIAGNOSIS — M6751 Plica syndrome, right knee: Secondary | ICD-10-CM | POA: Diagnosis not present

## 2023-07-12 ENCOUNTER — Other Ambulatory Visit (INDEPENDENT_AMBULATORY_CARE_PROVIDER_SITE_OTHER): Payer: Self-pay | Admitting: Nurse Practitioner

## 2023-07-19 DIAGNOSIS — M5136 Other intervertebral disc degeneration, lumbar region with discogenic back pain only: Secondary | ICD-10-CM | POA: Diagnosis not present

## 2023-07-21 DIAGNOSIS — M5136 Other intervertebral disc degeneration, lumbar region with discogenic back pain only: Secondary | ICD-10-CM | POA: Diagnosis not present

## 2023-07-25 DIAGNOSIS — M5136 Other intervertebral disc degeneration, lumbar region with discogenic back pain only: Secondary | ICD-10-CM | POA: Diagnosis not present

## 2023-07-27 DIAGNOSIS — M5136 Other intervertebral disc degeneration, lumbar region with discogenic back pain only: Secondary | ICD-10-CM | POA: Diagnosis not present

## 2023-08-01 DIAGNOSIS — M5136 Other intervertebral disc degeneration, lumbar region with discogenic back pain only: Secondary | ICD-10-CM | POA: Diagnosis not present

## 2023-08-03 DIAGNOSIS — M5136 Other intervertebral disc degeneration, lumbar region with discogenic back pain only: Secondary | ICD-10-CM | POA: Diagnosis not present

## 2023-08-08 DIAGNOSIS — M5136 Other intervertebral disc degeneration, lumbar region with discogenic back pain only: Secondary | ICD-10-CM | POA: Diagnosis not present

## 2023-08-10 DIAGNOSIS — M5136 Other intervertebral disc degeneration, lumbar region with discogenic back pain only: Secondary | ICD-10-CM | POA: Diagnosis not present

## 2023-09-20 ENCOUNTER — Other Ambulatory Visit

## 2023-09-22 ENCOUNTER — Encounter: Admitting: Internal Medicine

## 2023-10-18 ENCOUNTER — Other Ambulatory Visit (INDEPENDENT_AMBULATORY_CARE_PROVIDER_SITE_OTHER)

## 2023-10-18 DIAGNOSIS — E7800 Pure hypercholesterolemia, unspecified: Secondary | ICD-10-CM

## 2023-10-18 LAB — BASIC METABOLIC PANEL WITH GFR
BUN: 18 mg/dL (ref 6–23)
CO2: 30 meq/L (ref 19–32)
Calcium: 9.1 mg/dL (ref 8.4–10.5)
Chloride: 104 meq/L (ref 96–112)
Creatinine, Ser: 1.17 mg/dL (ref 0.40–1.50)
GFR: 64.99 mL/min (ref >60.00)
Glucose, Bld: 101 mg/dL — ABNORMAL HIGH (ref 70–99)
Potassium: 3.9 meq/L (ref 3.5–5.1)
Sodium: 140 meq/L (ref 135–145)

## 2023-10-18 LAB — HEPATIC FUNCTION PANEL
ALT: 16 U/L (ref 0–53)
AST: 18 U/L (ref 0–37)
Albumin: 4.4 g/dL (ref 3.5–5.2)
Alkaline Phosphatase: 46 U/L (ref 39–117)
Bilirubin, Direct: 0.1 mg/dL (ref 0.0–0.3)
Total Bilirubin: 0.4 mg/dL (ref 0.2–1.2)
Total Protein: 6.6 g/dL (ref 6.0–8.3)

## 2023-10-18 LAB — LIPID PANEL
Cholesterol: 171 mg/dL (ref 0–200)
HDL: 29.5 mg/dL — ABNORMAL LOW (ref >39.00)
LDL Cholesterol: 66 mg/dL (ref 0–99)
NonHDL: 141.13
Total CHOL/HDL Ratio: 6
Triglycerides: 378 mg/dL — ABNORMAL HIGH (ref 0.0–149.0)
VLDL: 75.6 mg/dL — ABNORMAL HIGH (ref 0.0–40.0)

## 2023-10-20 ENCOUNTER — Ambulatory Visit: Admitting: Internal Medicine

## 2023-10-20 ENCOUNTER — Encounter: Payer: Self-pay | Admitting: Internal Medicine

## 2023-10-20 VITALS — BP 110/70 | HR 56 | Temp 98.3°F | Ht 69.0 in | Wt 176.4 lb

## 2023-10-20 DIAGNOSIS — I2510 Atherosclerotic heart disease of native coronary artery without angina pectoris: Secondary | ICD-10-CM | POA: Diagnosis not present

## 2023-10-20 DIAGNOSIS — I700 Atherosclerosis of aorta: Secondary | ICD-10-CM

## 2023-10-20 DIAGNOSIS — K219 Gastro-esophageal reflux disease without esophagitis: Secondary | ICD-10-CM | POA: Diagnosis not present

## 2023-10-20 DIAGNOSIS — I10 Essential (primary) hypertension: Secondary | ICD-10-CM | POA: Diagnosis not present

## 2023-10-20 DIAGNOSIS — I739 Peripheral vascular disease, unspecified: Secondary | ICD-10-CM | POA: Diagnosis not present

## 2023-10-20 DIAGNOSIS — J449 Chronic obstructive pulmonary disease, unspecified: Secondary | ICD-10-CM | POA: Diagnosis not present

## 2023-10-20 DIAGNOSIS — R739 Hyperglycemia, unspecified: Secondary | ICD-10-CM

## 2023-10-20 DIAGNOSIS — R911 Solitary pulmonary nodule: Secondary | ICD-10-CM

## 2023-10-20 DIAGNOSIS — M545 Low back pain, unspecified: Secondary | ICD-10-CM

## 2023-10-20 DIAGNOSIS — E7800 Pure hypercholesterolemia, unspecified: Secondary | ICD-10-CM

## 2023-10-20 DIAGNOSIS — M25561 Pain in right knee: Secondary | ICD-10-CM

## 2023-10-20 LAB — POCT GLYCOSYLATED HEMOGLOBIN (HGB A1C): Hemoglobin A1C: 5.2 % (ref 4.0–5.6)

## 2023-10-20 MED ORDER — FENOFIBRATE 145 MG PO TABS: 145.0000 mg | ORAL_TABLET | Freq: Every day | ORAL | 1 refills | Status: AC

## 2023-10-20 MED ORDER — HYDROCHLOROTHIAZIDE 25 MG PO TABS: 25.0000 mg | ORAL_TABLET | Freq: Every day | ORAL | 1 refills | Status: AC

## 2023-10-20 MED ORDER — METOPROLOL SUCCINATE ER 25 MG PO TB24: 25.0000 mg | ORAL_TABLET | Freq: Every day | ORAL | 3 refills | Status: AC

## 2023-10-20 NOTE — Progress Notes (Signed)
 Subjective:    Patient ID: Travis Gray, male    DOB: 03/08/1955, 68 y.o.   MRN: [id number]  Patient here for  Chief Complaint  Patient presents with   Annual Exam    HPI Here for a scheduled follow up -  follow up regarding hypercholesterolemia, hypertension, PAD, COPD/ashtma.  Saw pulmonary 10/2022 - recommended trelegy. Had f/u with [facility] 02/07/23 - ABIs reviewed - stable, recommended continuing plavix  and statin. No leg pain reported.  Last visit - low back pain. Xray- multilevel arthritis changes. Recommended PT for his back and ortho referral for his kneesaw ortho 07/11/23 - s/p steroid injection.  S/p therapy. Reports no pain - right low back now. Pain - left side - low back. No radicular symptoms.. Previously had right knee issues. S/p injection. Right knee better now. Left knee - noticed popping - no pain. No chest pain reported. Breathing stable. Not smoking.    Past Medical History:  Diagnosis Date   Chronic headaches    migraines, 3-4x/yr   Degenerative disc disease, cervical    Genetic testing 03/20/2017   Multi-Cancer panel (83 genes) @ [facility] - No pathogenic mutations detected   GERD (gastroesophageal reflux disease)    Hypercholesterolemia    Hypertension    Myocardial infarction (HCC)    Old Infarct based on Stress test results in 2024   Obesity    Peripheral vascular disease    groin and right leg stents   Skin cancer    arm/ face area.  Has had removed   Sleep apnea    CPAP   Tobacco abuse    Past Surgical History:  Procedure Laterality Date   CARDIAC CATHETERIZATION     CATARACT EXTRACTION W/PHACO Left 04/11/2018   Procedure: CATARACT EXTRACTION PHACO AND INTRAOCULAR LENS PLACEMENT (IOC)  COMPLICATED LEFT;  Surgeon: Mittie Gaskin, MD;  Location: [facility];  Service: Ophthalmology;  Laterality: Left;  OMIDRIA  sleep apnea   COLONOSCOPY WITH PROPOFOL  N/A 11/25/2016   Procedure: COLONOSCOPY WITH PROPOFOL ;  Surgeon: Toledo, Ladell POUR, MD;   Location: [facility];  Service: Endoscopy;  Laterality: N/A;   ESOPHAGOGASTRODUODENOSCOPY (EGD) WITH PROPOFOL  N/A 11/25/2016   Procedure: ESOPHAGOGASTRODUODENOSCOPY (EGD) WITH PROPOFOL ;  Surgeon: Toledo, Ladell POUR, MD;  Location: [facility];  Service: Endoscopy;  Laterality: N/A;   HERNIA REPAIR     Inguinal,laparoscopic surgery   HOLEP-LASER ENUCLEATION OF THE PROSTATE WITH MORCELLATION N/A 03/19/2021   Procedure: HOLEP-LASER ENUCLEATION OF THE PROSTATE WITH MORCELLATION;  Surgeon: Francisca Redell BROCKS, MD;  Location: [facility] OR[facility];  Service: Urology;  Laterality: N/A;   INTERCOSTAL NERVE BLOCK Right 11/03/2022   Procedure: INTERCOSTAL NERVE BLOCK;  Surgeon: Shyrl Linnie KIDD, MD;  Location: [facility] OR;  Service: Thoracic;  Laterality: Right;   LYMPH NODE BIOPSY Right 11/03/2022   Procedure: LYMPH NODE BIOPSY;  Surgeon: Shyrl Linnie KIDD, MD;  Location: [facility] OR;  Service: Thoracic;  Laterality: Right;   PERIPHERAL VASCULAR CATHETERIZATION Right 05/21/2015   Procedure: Lower Extremity Angiography;  Surgeon: Selinda GORMAN Gu, MD;  Location: [facility];  Service: Cardiovascular;  Laterality: Right;   PERIPHERAL VASCULAR CATHETERIZATION  05/21/2015   Procedure: Lower Extremity Intervention;  Surgeon: Selinda GORMAN Gu, MD;  Location: [facility];  Service: Cardiovascular;;   radio-ablated therapy     completed in the pain clinic   RETINAL DETACHMENT SURGERY     on right eye x 2   Family History  Problem Relation Age of Onset   Hypertension Mother  Breast cancer Mother 15   Uterine cancer Mother        unconfirmed; dx 48s; TAH/BSO; currently 19   Hyperlipidemia Father    Bladder Cancer Paternal Grandfather 55       smoker; deceased 21   Diabetes Maternal Grandmother    Non-Hodgkin's lymphoma Paternal Aunt        deceased 51   Social History   Socioeconomic History   Marital status: Married    Spouse name: Not on file   Number of children: Not on file   Years of education:  Not on file   Highest education level: Not on file  Occupational History   Not on file  Tobacco Use   Smoking status: Former    Current packs/day: 0.00    Average packs/day: 0.5 packs/day for 37.0 years (18.5 ttl pk-yrs)    Types: Cigarettes    Quit date: 09/02/2022    Years since quitting: 1.1    Passive exposure: Past   Smokeless tobacco: Never   Tobacco comments:    1 pack will last 2-3 days--08/15/2022  Vaping Use   Vaping status: Former  Substance and Sexual Activity   Alcohol use: Yes    Alcohol/week: 0.0 standard drinks of alcohol    Comment: very occasional alcohol use - 3-4x/yr   Drug use: No   Sexual activity: Yes  Other Topics Concern   Not on file  Social History Narrative   Married   Social Drivers of Health   Financial Resource Strain: Low Risk  (07/11/2023)   Received from [facility]   Overall Financial Resource Strain (CARDIA)    Difficulty of Paying Living Expenses: Not hard at all  Food Insecurity: No Food Insecurity (07/11/2023)   Received from [facility]   Hunger Vital Sign    Within the past 12 months, you worried that your food would run out before you got the money to buy more.: Never true    Within the past 12 months, the food you bought just didn't last and you didn't have money to get more.: Never true  Transportation Needs: No Transportation Needs (07/11/2023)   Received from [facility] - Transportation    In the past 12 months, has lack of transportation kept you from medical appointments or from getting medications?: No    Lack of Transportation (Non-Medical): No  Physical Activity: Inactive (05/31/2023)   Exercise Vital Sign    Days of Exercise per Week: 0 days    Minutes of Exercise per Session: 0 min  Stress: No Stress Concern Present (05/31/2023)   [employer] of Occupational Health - Occupational Stress Questionnaire    Feeling of Stress : Not at all  Social  Connections: Moderately Integrated (05/31/2023)   Social Connection and Isolation Panel    Frequency of Communication with Friends and Family: More than three times a week    Frequency of Social Gatherings with Friends and Family: More than three times a week    Attends Religious Services: More than 4 times per year    Active Member of [employer] or Organizations: No    Attends Banker Meetings: Never    Marital Status: Married     Review of Systems  Constitutional:  Negative for appetite change and unexpected weight change.  HENT:  Negative for congestion and sinus pressure.   Respiratory:  Negative for cough, chest tightness and shortness of breath.   Cardiovascular:  Negative  for chest pain, palpitations and leg swelling.  Gastrointestinal:  Negative for abdominal pain, diarrhea, nausea and vomiting.  Genitourinary:  Negative for difficulty urinating and dysuria.  Musculoskeletal:  Negative for joint swelling and myalgias.       Left low back pain.   Skin:  Negative for color change and rash.  Neurological:  Negative for dizziness and headaches.  Psychiatric/Behavioral:  Negative for agitation and dysphoric mood.        Objective:     BP 110/70   Pulse (!) 56   Temp 98.3 F (36.8 C) (Oral)   Ht 5' 9 (1.753 m)   Wt 176 lb 6.4 oz (80 kg)   SpO2 96%   BMI 26.05 kg/m  Wt Readings from Last 3 Encounters:  10/20/23 176 lb 6.4 oz (80 kg)  06/14/23 174 lb (78.9 kg)  05/31/23 175 lb (79.4 kg)    Physical Exam Vitals reviewed.  Constitutional:      General: He is not in acute distress.    Appearance: Normal appearance. He is well-developed.  HENT:     Head: Normocephalic and atraumatic.     Right Ear: External ear normal.     Left Ear: External ear normal.     Mouth/Throat:     Pharynx: No oropharyngeal exudate or posterior oropharyngeal erythema.  Eyes:     General: No scleral icterus.       Right eye: No discharge.        Left eye: No discharge.      Conjunctiva/sclera: Conjunctivae normal.  Cardiovascular:     Rate and Rhythm: Normal rate and regular rhythm.  Pulmonary:     Effort: Pulmonary effort is normal. No respiratory distress.     Breath sounds: Normal breath sounds.  Abdominal:     General: Bowel sounds are normal.     Palpations: Abdomen is soft.     Tenderness: There is no abdominal tenderness.  Musculoskeletal:        General: No swelling or tenderness.     Cervical back: Neck supple. No tenderness.  Lymphadenopathy:     Cervical: No cervical adenopathy.  Skin:    Findings: No erythema or rash.  Neurological:     Mental Status: He is alert.  Psychiatric:        Mood and Affect: Mood normal.        Behavior: Behavior normal.         Outpatient Encounter Medications as of 10/20/2023  Medication Sig   albuterol  (VENTOLIN  HFA) 108 (90 Base) MCG/ACT inhaler Inhale 2 puffs into the lungs every 6 (six) hours as needed for wheezing or shortness of breath.   amitriptyline  (ELAVIL ) 25 MG tablet Take 1 tablet (25 mg total) by mouth at bedtime.   atorvastatin  (LIPITOR) 20 MG tablet Take 1 tablet (20 mg total) by mouth at bedtime.   clopidogrel  (PLAVIX ) 75 MG tablet TAKE 1 TABLET BY MOUTH ONCE DAILY   DULoxetine  (CYMBALTA ) 30 MG capsule Take 1 capsule (30 mg total) by mouth daily.   Fluticasone-Umeclidin-Vilant (TRELEGY ELLIPTA ) 100-62.5-25 MCG/ACT AEPB Inhale 1 puff into the lungs daily.   losartan  (COZAAR ) 100 MG tablet Take 1 tablet (100 mg total) by mouth daily.   Omega-3 Fatty Acids (FISH OIL PO) Take 2 capsules by mouth in the morning.   pantoprazole  (PROTONIX ) 40 MG tablet Take 1 tablet (40 mg total) by mouth daily.   vitamin B-12 (CYANOCOBALAMIN ) 1000 MCG tablet Take 1,000 mcg by mouth in the morning.  fenofibrate  (TRICOR ) 145 MG tablet Take 1 tablet (145 mg total) by mouth daily.   hydrochlorothiazide  (HYDRODIURIL ) 25 MG tablet Take 1 tablet (25 mg total) by mouth daily.   metoprolol  succinate (TOPROL -XL) 25 MG  24 hr tablet Take 1 tablet (25 mg total) by mouth daily.   tiZANidine  (ZANAFLEX ) 4 MG tablet Take 1 tablet (4 mg total) by mouth at bedtime as needed for muscle spasms.   [DISCONTINUED] fenofibrate  (TRICOR ) 145 MG tablet Take 1 tablet (145 mg total) by mouth daily. (Patient not taking: Reported on 10/20/2023)   [DISCONTINUED] hydrochlorothiazide  (HYDRODIURIL ) 25 MG tablet Take 1 tablet (25 mg total) by mouth daily.   [DISCONTINUED] metoprolol  succinate (TOPROL -XL) 25 MG 24 hr tablet TAKE 1 TABLET BY MOUTH ONCE DAILY   No facility-administered encounter medications on file as of 10/20/2023.     Lab Results  Component Value Date   WBC 7.2 03/08/2023   HGB 13.2 03/08/2023   HCT 37.8 (L) 03/08/2023   PLT 346.0 03/08/2023   GLUCOSE 101 (H) 10/18/2023   CHOL 171 10/18/2023   TRIG 378.0 (H) 10/18/2023   HDL 29.50 (L) 10/18/2023   LDLDIRECT 53.0 06/12/2023   LDLCALC 66 10/18/2023   ALT 16 10/18/2023   AST 18 10/18/2023   NA 140 10/18/2023   K 3.9 10/18/2023   CL 104 10/18/2023   CREATININE 1.17 10/18/2023   BUN 18 10/18/2023   CO2 30 10/18/2023   TSH 3.00 06/12/2023   PSA 0.18 06/13/2022   INR 1.0 11/01/2022   HGBA1C 5.2 10/20/2023    DG Chest 2 View Result Date: 12/13/2022 CLINICAL DATA:  History of prior right upper lobectomy EXAM: CHEST - 2 VIEW COMPARISON:  11/07/2022 FINDINGS: Cardiac shadow is within normal limits. The lungs are well aerated bilaterally. Postsurgical changes are noted in the right suprahilar region. No pneumothorax is seen. No bony abnormality is noted. IMPRESSION: No active cardiopulmonary disease. Electronically Signed   By: Oneil Devonshire [name]   On: 12/13/2022 19:02       Assessment & Plan:  Essential hypertension, benign Assessment & Plan: On toprol  and losartan . Follow pressures. Follow metabolic panel. No changes in medication today.    Hypercholesterolemia Assessment & Plan: Continue lipitor.  Reviewed recent lipid panel. Triglycerides improved. Was  on tricor . Off now. Discussed continuing tricor . Low carb diet and exercise. Follow lipid panel.  Lab Results  Component Value Date   CHOL 171 10/18/2023   HDL 29.50 (L) 10/18/2023   LDLCALC 66 10/18/2023   LDLDIRECT 53.0 06/12/2023   TRIG 378.0 (H) 10/18/2023   CHOLHDL 6 10/18/2023    Orders: -     Lipid panel; Future -     Hepatic function panel; Future -     Basic metabolic panel with GFR; Future  Hyperglycemia Assessment & Plan: Low carb diet and exercise. Follow met b and A1c.   Orders: -     Hemoglobin A1c; Future -     POCT glycosylated hemoglobin (Hb A1C)  Right knee pain, unspecified chronicity Assessment & Plan: Resolved.    Peripheral vascular disease Assessment & Plan: F/u 02/07/23 - His ABIs today are 1.01 on the right and 1.07 on the left with triphasic waveforms and normal digital pressures bilaterally. Doing well after revascularization several years ago. Continue Plavix  and statin agent. Recheck on an annual basis.    Lung nodule Assessment & Plan: Admitted 11/03/22 - 11/07/22 - He underwent robotic assisted right upper lobectomy. Pathology was negative for malignancy, but there was  a background of interstitial lung disease. Had f/u with pulmonary 11/15/22 - recommended trelegy. Breathing stable. Not smoking.    Left-sided low back pain without sciatica, unspecified chronicity Assessment & Plan:   Last visit - low back pain. Xray- multilevel arthritis changes. Recommended PT for his back and ortho referral for his kneesaw ortho 07/11/23 - s/p steroid injection.  S/p therapy. Reports no pain - right low back now. Pain - left side - low back. No radicular symptoms. Has been to PT. Refer back to ortho.   Orders: -     Ambulatory referral to Orthopedic Surgery  Gastroesophageal reflux disease, unspecified whether esophagitis present Assessment & Plan: No upper symptoms. Continue protonix .    Chronic obstructive pulmonary disease, unspecified COPD type  (HCC) Assessment & Plan: Continue trelegy. Breathing stable.    Coronary artery disease involving native coronary artery of native heart without angina pectoris Assessment & Plan: Coronary calcium  score of 61. This was 47th percentile for age, sex, and race matched control. 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 There is a retro-aortic course with only one high risk feature: Acute angle take off. CAD-RADS 2. Mild non-obstructive CAD (25-49%). Continue risk factor modification. Denies chest pain or sob.    Aortic atherosclerosis Assessment & Plan: Continue lipitor.    Other orders -     hydroCHLOROthiazide ; Take 1 tablet (25 mg total) by mouth daily.  Dispense: 90 tablet; Refill: 1 -     Metoprolol  Succinate ER; Take 1 tablet (25 mg total) by mouth daily.  Dispense: 90 tablet; Refill: 3 -     Fenofibrate ; Take 1 tablet (145 mg total) by mouth daily.  Dispense: 90 tablet; Refill: 1     Allena Hamilton, MD

## 2023-10-23 ENCOUNTER — Ambulatory Visit: Payer: Self-pay | Admitting: Internal Medicine

## 2023-10-23 ENCOUNTER — Encounter: Payer: Self-pay | Admitting: Internal Medicine

## 2023-10-23 NOTE — Assessment & Plan Note (Signed)
 Low-carb diet and exercise.  Follow met b and A1c.

## 2023-10-23 NOTE — Assessment & Plan Note (Signed)
 On toprol  and losartan . Follow pressures. Follow metabolic panel. No changes in medication today.

## 2023-10-23 NOTE — Assessment & Plan Note (Signed)
 F/u 02/07/23 - His ABIs today are 1.01 on the right and 1.07 on the left with triphasic waveforms and normal digital pressures bilaterally. Doing well after revascularization several years ago. Continue Plavix  and statin agent. Recheck on an annual basis.

## 2023-10-23 NOTE — Assessment & Plan Note (Signed)
 Continue lipitor.  Reviewed recent lipid panel. Triglycerides improved. Was on tricor . Off now. Discussed continuing tricor . Low carb diet and exercise. Follow lipid panel.  Lab Results  Component Value Date   CHOL 171 10/18/2023   HDL 29.50 (L) 10/18/2023   LDLCALC 66 10/18/2023   LDLDIRECT 53.0 06/12/2023   TRIG 378.0 (H) 10/18/2023   CHOLHDL 6 10/18/2023

## 2023-10-23 NOTE — Assessment & Plan Note (Signed)
 Admitted 11/03/22 - 11/07/22 - He underwent robotic assisted right upper lobectomy. Pathology was negative for malignancy, but there was a background of interstitial lung disease. Had f/u with pulmonary 11/15/22 - recommended trelegy. Breathing stable. Not smoking.

## 2023-10-23 NOTE — Assessment & Plan Note (Signed)
 Continue lipitor  ?

## 2023-10-23 NOTE — Assessment & Plan Note (Signed)
 Coronary calcium  score of 61. This was 47th percentile for age, sex, and race matched control. 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 There is a retro-aortic course with only one high risk feature: Acute angle take off. CAD-RADS 2. Mild non-obstructive CAD (25-49%). Continue risk factor modification. Denies chest pain or sob.

## 2023-10-23 NOTE — Assessment & Plan Note (Addendum)
 Last visit - low back pain. Xray- multilevel arthritis changes. Recommended PT for his back and ortho referral for his kneesaw ortho 07/11/23 - s/p steroid injection.  S/p therapy. Reports no pain - right low back now. Pain - left side - low back. No radicular symptoms. Has been to PT. Refer back to ortho.

## 2023-10-23 NOTE — Assessment & Plan Note (Signed)
Continue trelegy.  Breathing stable.   

## 2023-10-23 NOTE — Assessment & Plan Note (Signed)
 Resolved

## 2023-10-23 NOTE — Assessment & Plan Note (Signed)
 No upper symptoms.  Continue protonix .

## 2023-11-13 DIAGNOSIS — M47816 Spondylosis without myelopathy or radiculopathy, lumbar region: Secondary | ICD-10-CM | POA: Diagnosis not present

## 2023-11-20 DIAGNOSIS — Z961 Presence of intraocular lens: Secondary | ICD-10-CM | POA: Diagnosis not present

## 2023-11-20 DIAGNOSIS — H3322 Serous retinal detachment, left eye: Secondary | ICD-10-CM | POA: Diagnosis not present

## 2023-11-20 DIAGNOSIS — Z8669 Personal history of other diseases of the nervous system and sense organs: Secondary | ICD-10-CM | POA: Diagnosis not present

## 2023-11-21 DIAGNOSIS — H3321 Serous retinal detachment, right eye: Secondary | ICD-10-CM | POA: Diagnosis not present

## 2023-11-21 DIAGNOSIS — H3589 Other specified retinal disorders: Secondary | ICD-10-CM | POA: Diagnosis not present

## 2023-11-22 DIAGNOSIS — I739 Peripheral vascular disease, unspecified: Secondary | ICD-10-CM | POA: Diagnosis not present

## 2023-11-22 DIAGNOSIS — H3322 Serous retinal detachment, left eye: Secondary | ICD-10-CM | POA: Diagnosis not present

## 2023-11-22 DIAGNOSIS — H3321 Serous retinal detachment, right eye: Secondary | ICD-10-CM | POA: Diagnosis not present

## 2023-11-22 DIAGNOSIS — H4422 Degenerative myopia, left eye: Secondary | ICD-10-CM | POA: Diagnosis not present

## 2023-11-22 DIAGNOSIS — I10 Essential (primary) hypertension: Secondary | ICD-10-CM | POA: Diagnosis not present

## 2023-11-22 DIAGNOSIS — I2510 Atherosclerotic heart disease of native coronary artery without angina pectoris: Secondary | ICD-10-CM | POA: Diagnosis not present

## 2023-11-22 DIAGNOSIS — Z961 Presence of intraocular lens: Secondary | ICD-10-CM | POA: Diagnosis not present

## 2023-11-22 DIAGNOSIS — H33002 Unspecified retinal detachment with retinal break, left eye: Secondary | ICD-10-CM | POA: Diagnosis not present

## 2023-11-22 DIAGNOSIS — J4489 Other specified chronic obstructive pulmonary disease: Secondary | ICD-10-CM | POA: Diagnosis not present

## 2023-11-28 DIAGNOSIS — M5416 Radiculopathy, lumbar region: Secondary | ICD-10-CM | POA: Diagnosis not present

## 2023-11-28 DIAGNOSIS — M5126 Other intervertebral disc displacement, lumbar region: Secondary | ICD-10-CM | POA: Diagnosis not present

## 2023-12-26 DIAGNOSIS — M5126 Other intervertebral disc displacement, lumbar region: Secondary | ICD-10-CM | POA: Diagnosis not present

## 2023-12-26 DIAGNOSIS — M5416 Radiculopathy, lumbar region: Secondary | ICD-10-CM | POA: Diagnosis not present

## 2024-01-06 ENCOUNTER — Other Ambulatory Visit: Payer: Self-pay | Admitting: Internal Medicine

## 2024-02-07 ENCOUNTER — Other Ambulatory Visit (INDEPENDENT_AMBULATORY_CARE_PROVIDER_SITE_OTHER): Payer: Self-pay | Admitting: Vascular Surgery

## 2024-02-07 DIAGNOSIS — I739 Peripheral vascular disease, unspecified: Secondary | ICD-10-CM

## 2024-02-09 ENCOUNTER — Ambulatory Visit (INDEPENDENT_AMBULATORY_CARE_PROVIDER_SITE_OTHER): Payer: HMO | Admitting: Vascular Surgery

## 2024-02-09 ENCOUNTER — Other Ambulatory Visit (INDEPENDENT_AMBULATORY_CARE_PROVIDER_SITE_OTHER): Payer: HMO

## 2024-02-09 VITALS — BP 143/87 | HR 55 | Resp 17 | Ht 70.0 in | Wt 177.8 lb

## 2024-02-09 DIAGNOSIS — E7800 Pure hypercholesterolemia, unspecified: Secondary | ICD-10-CM | POA: Diagnosis not present

## 2024-02-09 DIAGNOSIS — I10 Essential (primary) hypertension: Secondary | ICD-10-CM | POA: Diagnosis not present

## 2024-02-09 DIAGNOSIS — I739 Peripheral vascular disease, unspecified: Secondary | ICD-10-CM | POA: Diagnosis not present

## 2024-02-09 NOTE — Progress Notes (Signed)
 "   MRN : [id number]  Travis Gray is a 69 y.o. (1955-07-30) male who presents with chief complaint of  Chief Complaint  Patient presents with   Follow-up    73yr and ABI  .  History of Present Illness:   Discussed the use of AI scribe software for clinical note transcription with the patient, who gave verbal consent to proceed.  History of Present Illness Travis Gray is a 69 year old male with peripheral vascular disease status post iliac artery stenting and SFA intervention who presents for routine annual vascular surgery follow-up.  He reports stable lower extremity symptoms without claudication or pain and has no new or worsening complaints.  He underwent lower extremity intervention with SFA and bilateral iliac intervention about nine years ago and has had no recurrent symptoms or complications since the procedure.    Results Diagnostic Ankle-brachial index bilateral (02/09/2024): Within normal limits: right 1.11, left 1.14, normal waveforms and digit pressures over 100 bilaterally  Current Outpatient Medications  Medication Sig Dispense Refill   albuterol  (VENTOLIN  HFA) 108 (90 Base) MCG/ACT inhaler Inhale 2 puffs into the lungs every 6 (six) hours as needed for wheezing or shortness of breath. 8 g 2   amitriptyline  (ELAVIL ) 25 MG tablet TAKE 1 TABLET BY MOUTH AT BEDTIME 90 tablet 3   atorvastatin  (LIPITOR) 20 MG tablet Take 1 tablet (20 mg total) by mouth at bedtime. 90 tablet 3   clopidogrel  (PLAVIX ) 75 MG tablet TAKE 1 TABLET BY MOUTH ONCE DAILY 30 tablet 11   DULoxetine  (CYMBALTA ) 30 MG capsule Take 1 capsule (30 mg total) by mouth daily. 90 capsule 3   fenofibrate  (TRICOR ) 145 MG tablet Take 1 tablet (145 mg total) by mouth daily. 90 tablet 1   Fluticasone-Umeclidin-Vilant (TRELEGY ELLIPTA ) 100-62.5-25 MCG/ACT AEPB Inhale 1 puff into the lungs daily. 60 each 11   hydrochlorothiazide  (HYDRODIURIL ) 25 MG tablet Take 1 tablet (25 mg total) by mouth daily. 90 tablet  1   losartan  (COZAAR ) 100 MG tablet Take 1 tablet (100 mg total) by mouth daily. 90 tablet 3   metoprolol  succinate (TOPROL -XL) 25 MG 24 hr tablet Take 1 tablet (25 mg total) by mouth daily. 90 tablet 3   Omega-3 Fatty Acids (FISH OIL PO) Take 2 capsules by mouth in the morning.     pantoprazole  (PROTONIX ) 40 MG tablet Take 1 tablet (40 mg total) by mouth daily. 90 tablet 3   vitamin B-12 (CYANOCOBALAMIN ) 1000 MCG tablet Take 1,000 mcg by mouth in the morning.     No current facility-administered medications for this visit.    Past Medical History:  Diagnosis Date   Chronic headaches    migraines, 3-4x/yr   Degenerative disc disease, cervical    Genetic testing 03/20/2017   Multi-Cancer panel (83 genes) @ [facility] - No pathogenic mutations detected   GERD (gastroesophageal reflux disease)    Hypercholesterolemia    Hypertension    Myocardial infarction (HCC)    Old Infarct based on Stress test results in 2024   Obesity    Peripheral vascular disease    groin and right leg stents   Skin cancer    arm/ face area.  Has had removed   Sleep apnea    CPAP   Tobacco abuse     Past Surgical History:  Procedure Laterality Date   CARDIAC CATHETERIZATION     CATARACT EXTRACTION W/PHACO Left 04/11/2018   Procedure: CATARACT EXTRACTION PHACO AND INTRAOCULAR LENS PLACEMENT (IOC)  COMPLICATED  LEFT;  Surgeon: Mittie Gaskin, MD;  Location: [facility];  Service: Ophthalmology;  Laterality: Left;  OMIDRIA  sleep apnea   COLONOSCOPY WITH PROPOFOL  N/A 11/25/2016   Procedure: COLONOSCOPY WITH PROPOFOL ;  Surgeon: Toledo, Ladell POUR, MD;  Location: [facility];  Service: Endoscopy;  Laterality: N/A;   ESOPHAGOGASTRODUODENOSCOPY (EGD) WITH PROPOFOL  N/A 11/25/2016   Procedure: ESOPHAGOGASTRODUODENOSCOPY (EGD) WITH PROPOFOL ;  Surgeon: Toledo, Ladell POUR, MD;  Location: [facility];  Service: Endoscopy;  Laterality: N/A;   HERNIA REPAIR     Inguinal,laparoscopic surgery    HOLEP-LASER ENUCLEATION OF THE PROSTATE WITH MORCELLATION N/A 03/19/2021   Procedure: HOLEP-LASER ENUCLEATION OF THE PROSTATE WITH MORCELLATION;  Surgeon: Francisca Redell BROCKS, MD;  Location: [facility];  Service: Urology;  Laterality: N/A;   INTERCOSTAL NERVE BLOCK Right 11/03/2022   Procedure: INTERCOSTAL NERVE BLOCK;  Surgeon: Shyrl Linnie KIDD, MD;  Location: [facility];  Service: Thoracic;  Laterality: Right;   LYMPH NODE BIOPSY Right 11/03/2022   Procedure: LYMPH NODE BIOPSY;  Surgeon: Shyrl Linnie KIDD, MD;  Location: [facility];  Service: Thoracic;  Laterality: Right;   PERIPHERAL VASCULAR CATHETERIZATION Right 05/21/2015   Procedure: Lower Extremity Angiography;  Surgeon: Selinda GORMAN Gu, MD;  Location: [facility];  Service: Cardiovascular;  Laterality: Right;   PERIPHERAL VASCULAR CATHETERIZATION  05/21/2015   Procedure: Lower Extremity Intervention;  Surgeon: Selinda GORMAN Gu, MD;  Location: [facility];  Service: Cardiovascular;;   radio-ablated therapy     completed in the pain clinic   RETINAL DETACHMENT SURGERY     on right eye x 2     Social History[1]    Family History  Problem Relation Age of Onset   Hypertension Mother    Breast cancer Mother 20   Uterine cancer Mother        unconfirmed; dx 19s; TAH/BSO; currently 68   Hyperlipidemia Father    Bladder Cancer Paternal Grandfather 13       smoker; deceased 75   Diabetes Maternal Grandmother    Non-Hodgkin's lymphoma Paternal Aunt        deceased 88     Allergies[2]   REVIEW OF SYSTEMS (Negative unless checked)   Constitutional: [x] Weight loss  [] Fever  [] Chills Cardiac: [] Chest pain   [] Chest pressure   [] Palpitations   [] Shortness of breath when laying flat   [] Shortness of breath at rest   [x] Shortness of breath with exertion. Vascular:  [x] Pain in legs with walking   [] Pain in legs at rest   [] Pain in legs when laying flat   [] Claudication   [] Pain in feet when walking  [] Pain in feet at rest  [] Pain in  feet when laying flat   [] History of DVT   [] Phlebitis   [] Swelling in legs   [] Varicose veins   [] Non-healing ulcers Pulmonary:   [] Uses home oxygen   [] Productive cough   [] Hemoptysis   [] Wheeze  [] COPD   [] Asthma Neurologic:  [] Dizziness  [] Blackouts   [] Seizures   [] History of stroke   [] History of TIA  [] Aphasia   [] Temporary blindness   [] Dysphagia   [] Weakness or numbness in arms   [] Weakness or numbness in legs Musculoskeletal:  [x] Arthritis   [] Joint swelling   [] Joint pain   [] Low back pain Hematologic:  [] Easy bruising  [] Easy bleeding   [] Hypercoagulable state   [] Anemic   Gastrointestinal:  [] Blood in stool   [] Vomiting blood  [] Gastroesophageal reflux/heartburn   [] Abdominal pain Genitourinary:  [] Chronic kidney disease   [] Difficult  urination  [] Frequent urination  [] Burning with urination   [] Hematuria Skin:  [] Rashes   [] Ulcers   [] Wounds Psychological:  [] History of anxiety   []  History of major depression.  Physical Examination  BP (!) 143/87   Pulse (!) 55   Resp 17   Ht 5' 10 (1.778 m)   Wt 177 lb 12.8 oz (80.6 kg)   BMI 25.51 kg/m  Gen:  WD/WN, NAD Head: [place]/AT, No temporalis wasting. Ear/Nose/Throat: Hearing grossly intact, nares w/o erythema or drainage Eyes: Conjunctiva clear. Sclera non-icteric Neck: Supple.  Trachea midline Pulmonary:  Good air movement, no use of accessory muscles.  Cardiac: RRR, no JVD Vascular:  Vessel Right Left  Radial Palpable Palpable                          PT Palpable Palpable  DP Palpable Palpable   Gastrointestinal: soft, non-tender/non-distended. No guarding/reflex.  Musculoskeletal: M/S 5/5 throughout.  No deformity or atrophy. No edema. Neurologic: Sensation grossly intact in extremities.  Symmetrical.  Speech is fluent.  Psychiatric: Judgment intact, Mood & affect appropriate for pt's clinical situation. Dermatologic: No rashes or ulcers noted.  No cellulitis or open wounds.  Physical Exam     Labs No  results found for this or any previous visit (from the past 2160 hours).  Radiology No results found.  Assessment/Plan Assessment & Plan Peripheral vascular disease Peripheral vascular disease post iliac artery stenting with excellent long-term patency. No progression or complications. Ongoing surveillance needed for potential disease elsewhere. - Continued annual vascular surveillance with noninvasive studies. - Educated him on iliac artery stent durability and potential for disease in other vascular territories.   Essential hypertension, benign blood pressure control important in reducing the progression of atherosclerotic disease. On appropriate oral medications.     Pure hypercholesterolemia lipid control important in reducing the progression of atherosclerotic disease. Continue statin therapy   Selinda Gu, MD  02/09/2024 9:40 AM    This note was created with Dragon medical transcription system.  Any errors from dictation are purely unintentional     [1]  Social History Tobacco Use   Smoking status: Former    Current packs/day: 0.00    Average packs/day: 0.5 packs/day for 37.0 years (18.5 ttl pk-yrs)    Types: Cigarettes    Quit date: 09/02/2022    Years since quitting: 1.4    Passive exposure: Past   Smokeless tobacco: Never   Tobacco comments:    1 pack will last 2-3 days--08/15/2022  Vaping Use   Vaping status: Former  Substance Use Topics   Alcohol use: Yes    Alcohol/week: 0.0 standard drinks of alcohol    Comment: very occasional alcohol use - 3-4x/yr   Drug use: No  [2] No Known Allergies  "

## 2024-02-12 LAB — VAS US ABI WITH/WO TBI
Left ABI: 1.14
Right ABI: 1.11

## 2024-02-15 ENCOUNTER — Other Ambulatory Visit (INDEPENDENT_AMBULATORY_CARE_PROVIDER_SITE_OTHER)

## 2024-02-15 DIAGNOSIS — E7800 Pure hypercholesterolemia, unspecified: Secondary | ICD-10-CM | POA: Diagnosis not present

## 2024-02-15 DIAGNOSIS — R739 Hyperglycemia, unspecified: Secondary | ICD-10-CM

## 2024-02-15 LAB — HEPATIC FUNCTION PANEL
ALT: 14 U/L (ref 3–53)
AST: 16 U/L (ref 5–37)
Albumin: 4.6 g/dL (ref 3.5–5.2)
Alkaline Phosphatase: 35 U/L — ABNORMAL LOW (ref 39–117)
Bilirubin, Direct: 0.1 mg/dL (ref 0.1–0.3)
Total Bilirubin: 0.4 mg/dL (ref 0.2–1.2)
Total Protein: 6.9 g/dL (ref 6.0–8.3)

## 2024-02-15 LAB — BASIC METABOLIC PANEL WITH GFR
BUN: 19 mg/dL (ref 6–23)
CO2: 29 meq/L (ref 19–32)
Calcium: 9.7 mg/dL (ref 8.4–10.5)
Chloride: 104 meq/L (ref 96–112)
Creatinine, Ser: 1.33 mg/dL (ref 0.40–1.50)
GFR: 55.6 mL/min — ABNORMAL LOW
Glucose, Bld: 107 mg/dL — ABNORMAL HIGH (ref 70–99)
Potassium: 4 meq/L (ref 3.5–5.1)
Sodium: 142 meq/L (ref 135–145)

## 2024-02-15 LAB — LIPID PANEL
Cholesterol: 143 mg/dL (ref 28–200)
HDL: 32.7 mg/dL — ABNORMAL LOW
LDL Cholesterol: 68 mg/dL (ref 10–99)
NonHDL: 110.1
Total CHOL/HDL Ratio: 4
Triglycerides: 210 mg/dL — ABNORMAL HIGH (ref 10.0–149.0)
VLDL: 42 mg/dL — ABNORMAL HIGH (ref 0.0–40.0)

## 2024-02-15 LAB — HEMOGLOBIN A1C: Hgb A1c MFr Bld: 5.8 % (ref 4.6–6.5)

## 2024-02-19 ENCOUNTER — Ambulatory Visit: Admitting: Internal Medicine

## 2024-02-19 ENCOUNTER — Encounter: Payer: Self-pay | Admitting: Internal Medicine

## 2024-02-19 ENCOUNTER — Telehealth (INDEPENDENT_AMBULATORY_CARE_PROVIDER_SITE_OTHER): Admitting: Internal Medicine

## 2024-02-19 VITALS — BP 124/80 | HR 65 | Ht 70.0 in | Wt 177.0 lb

## 2024-02-19 DIAGNOSIS — R911 Solitary pulmonary nodule: Secondary | ICD-10-CM | POA: Diagnosis not present

## 2024-02-19 DIAGNOSIS — J449 Chronic obstructive pulmonary disease, unspecified: Secondary | ICD-10-CM | POA: Diagnosis not present

## 2024-02-19 DIAGNOSIS — I10 Essential (primary) hypertension: Secondary | ICD-10-CM

## 2024-02-19 DIAGNOSIS — K219 Gastro-esophageal reflux disease without esophagitis: Secondary | ICD-10-CM | POA: Diagnosis not present

## 2024-02-19 DIAGNOSIS — R739 Hyperglycemia, unspecified: Secondary | ICD-10-CM

## 2024-02-19 DIAGNOSIS — M545 Low back pain, unspecified: Secondary | ICD-10-CM | POA: Diagnosis not present

## 2024-02-19 DIAGNOSIS — E7800 Pure hypercholesterolemia, unspecified: Secondary | ICD-10-CM | POA: Diagnosis not present

## 2024-02-19 DIAGNOSIS — I2510 Atherosclerotic heart disease of native coronary artery without angina pectoris: Secondary | ICD-10-CM

## 2024-02-19 DIAGNOSIS — I739 Peripheral vascular disease, unspecified: Secondary | ICD-10-CM

## 2024-02-19 NOTE — Progress Notes (Unsigned)
 Patient ID: Travis Gray, male   DOB: August 05, 1955, 69 y.o.   MRN: [id number]   Virtual Visit via video Note  I connected with Royal Her by a video enabled telemedicine application or telephone and verified that I am speaking with the correct person using two identifiers. Location patient: home Location provider: home Persons participating in the virtual visit: patient, provider  The limitations, risks, security and privacy concerns of performing an evaluation and management service by video and the availability of in person appointments have been discussed. It has also been discussed with the patient that there may be a patient responsible charge related to this service. The patient expressed understanding and agreed to proceed.  Interactive audio and video telecommunications were attempted between this provider and patient, however failed, due to patient having technical difficulties OR patient did not have access to video capability.  We continued and completed visit with audio only. ***  Reason for visit: follow up appt.   HPI: Follow up appt - follow up regarding hypercholesterolemia, hypertension, PAD, COPD/ashtma.    ROS: See pertinent positives and negatives per HPI.  Past Medical History:  Diagnosis Date   Chronic headaches    migraines, 3-4x/yr   Degenerative disc disease, cervical    Genetic testing 03/20/2017   Multi-Cancer panel (83 genes) @ [facility] - No pathogenic mutations detected   GERD (gastroesophageal reflux disease)    Hypercholesterolemia    Hypertension    Myocardial infarction (HCC)    Old Infarct based on Stress test results in 2024   Obesity    Peripheral vascular disease    groin and right leg stents   Skin cancer    arm/ face area.  Has had removed   Sleep apnea    CPAP   Tobacco abuse     Past Surgical History:  Procedure Laterality Date   CARDIAC CATHETERIZATION     CATARACT EXTRACTION W/PHACO Left 04/11/2018   Procedure: CATARACT  EXTRACTION PHACO AND INTRAOCULAR LENS PLACEMENT (IOC)  COMPLICATED LEFT;  Surgeon: Mittie Gaskin, MD;  Location: [facility];  Service: Ophthalmology;  Laterality: Left;  OMIDRIA  sleep apnea   COLONOSCOPY WITH PROPOFOL  N/A 11/25/2016   Procedure: COLONOSCOPY WITH PROPOFOL ;  Surgeon: Toledo, Ladell POUR, MD;  Location: [facility];  Service: Endoscopy;  Laterality: N/A;   ESOPHAGOGASTRODUODENOSCOPY (EGD) WITH PROPOFOL  N/A 11/25/2016   Procedure: ESOPHAGOGASTRODUODENOSCOPY (EGD) WITH PROPOFOL ;  Surgeon: Toledo, Ladell POUR, MD;  Location: [facility];  Service: Endoscopy;  Laterality: N/A;   HERNIA REPAIR     Inguinal,laparoscopic surgery   HOLEP-LASER ENUCLEATION OF THE PROSTATE WITH MORCELLATION N/A 03/19/2021   Procedure: HOLEP-LASER ENUCLEATION OF THE PROSTATE WITH MORCELLATION;  Surgeon: Francisca Redell BROCKS, MD;  Location: [facility];  Service: Urology;  Laterality: N/A;   INTERCOSTAL NERVE BLOCK Right 11/03/2022   Procedure: INTERCOSTAL NERVE BLOCK;  Surgeon: Shyrl Linnie KIDD, MD;  Location: [facility];  Service: Thoracic;  Laterality: Right;   LYMPH NODE BIOPSY Right 11/03/2022   Procedure: LYMPH NODE BIOPSY;  Surgeon: Shyrl Linnie KIDD, MD;  Location: [facility];  Service: Thoracic;  Laterality: Right;   PERIPHERAL VASCULAR CATHETERIZATION Right 05/21/2015   Procedure: Lower Extremity Angiography;  Surgeon: Selinda GORMAN Gu, MD;  Location: [facility];  Service: Cardiovascular;  Laterality: Right;   PERIPHERAL VASCULAR CATHETERIZATION  05/21/2015   Procedure: Lower Extremity Intervention;  Surgeon: Selinda GORMAN Gu, MD;  Location: [facility];  Service: Cardiovascular;;   radio-ablated therapy     completed in  the pain clinic   RETINAL DETACHMENT SURGERY     on right eye x 2    Family History  Problem Relation Age of Onset   Hypertension Mother    Breast cancer Mother 31   Uterine cancer Mother        unconfirmed; dx 41s; TAH/BSO; currently 2   Hyperlipidemia  Father    Bladder Cancer Paternal Grandfather 78       smoker; deceased 51   Diabetes Maternal Grandmother    Non-Hodgkin's lymphoma Paternal Aunt        deceased 36    SOCIAL HX: ***  Current Medications[1]  EXAM:  VITALS per patient if applicable:  GENERAL: alert, oriented, appears well and in no acute distress  HEENT: atraumatic, conjunttiva clear, no obvious abnormalities on inspection of external nose and ears  NECK: normal movements of the head and neck  LUNGS: on inspection no signs of respiratory distress, breathing rate appears normal, no obvious gross SOB, gasping or wheezing  CV: no obvious cyanosis  MS: moves all visible extremities without noticeable abnormality  PSYCH/NEURO: pleasant and cooperative, no obvious depression or anxiety, speech and thought processing grossly intact  ASSESSMENT AND PLAN:  Discussed the following assessment and plan:  Problem List Items Addressed This Visit   None   No follow-ups on file.   I discussed the assessment and treatment plan with the patient. The patient was provided an opportunity to ask questions and all were answered. The patient agreed with the plan and demonstrated an understanding of the instructions.   The patient was advised to call back or seek an in-person evaluation if the symptoms worsen or if the condition fails to improve as anticipated.  I provided *** minutes of non-face-to-face time during this encounter.   Allena Hamilton, MD       [1]  Current Outpatient Medications:    amitriptyline  (ELAVIL ) 25 MG tablet, TAKE 1 TABLET BY MOUTH AT BEDTIME, Disp: 90 tablet, Rfl: 3   atorvastatin  (LIPITOR) 20 MG tablet, Take 1 tablet (20 mg total) by mouth at bedtime., Disp: 90 tablet, Rfl: 3   clopidogrel  (PLAVIX ) 75 MG tablet, TAKE 1 TABLET BY MOUTH ONCE DAILY, Disp: 30 tablet, Rfl: 11   DULoxetine  (CYMBALTA ) 30 MG capsule, Take 1 capsule (30 mg total) by mouth daily., Disp: 90 capsule, Rfl: 3    fenofibrate  (TRICOR ) 145 MG tablet, Take 1 tablet (145 mg total) by mouth daily., Disp: 90 tablet, Rfl: 1   hydrochlorothiazide  (HYDRODIURIL ) 25 MG tablet, Take 1 tablet (25 mg total) by mouth daily., Disp: 90 tablet, Rfl: 1   losartan  (COZAAR ) 100 MG tablet, Take 1 tablet (100 mg total) by mouth daily., Disp: 90 tablet, Rfl: 3   metoprolol  succinate (TOPROL -XL) 25 MG 24 hr tablet, Take 1 tablet (25 mg total) by mouth daily., Disp: 90 tablet, Rfl: 3   Omega-3 Fatty Acids (FISH OIL PO), Take 2 capsules by mouth in the morning., Disp: , Rfl:    pantoprazole  (PROTONIX ) 40 MG tablet, Take 1 tablet (40 mg total) by mouth daily., Disp: 90 tablet, Rfl: 3   vitamin B-12 (CYANOCOBALAMIN ) 1000 MCG tablet, Take 1,000 mcg by mouth in the morning., Disp: , Rfl:    Fluticasone-Umeclidin-Vilant (TRELEGY ELLIPTA ) 100-62.5-25 MCG/ACT AEPB, Inhale 1 puff into the lungs daily. (Patient not taking: Reported on 02/19/2024), Disp: 60 each, Rfl: 11

## 2024-02-21 ENCOUNTER — Encounter: Payer: Self-pay | Admitting: Internal Medicine

## 2024-02-21 NOTE — Assessment & Plan Note (Signed)
 On toprol  and losartan . Follow pressures. Follow metabolic panel. No change in medication today.

## 2024-02-21 NOTE — Assessment & Plan Note (Signed)
 Continues on protonix . No upper symptoms.

## 2024-02-21 NOTE — Assessment & Plan Note (Signed)
 F/u [facility] 02/19/24 - stable. Recommended continuing annual f/u with [facility].

## 2024-02-21 NOTE — Assessment & Plan Note (Signed)
 Admitted 11/03/22 - 11/07/22 - He underwent robotic assisted right upper lobectomy. Pathology was negative for malignancy, but there was a background of interstitial lung disease. Had f/u with pulmonary 11/15/22 - recommended trelegy. Breathing stable. Not smoking.  No sob. Discussed f/u with pulmonary.

## 2024-02-21 NOTE — Assessment & Plan Note (Signed)
 Low-carb diet and exercise.  Follow met b and A1c.

## 2024-02-21 NOTE — Assessment & Plan Note (Signed)
"   Seeing physiatry - f/u lumbar radiculitis. S/p ESI 12/2023. Did help. Pain returning. Scheduled for f/u [facility] 02/13/24.  "

## 2024-02-21 NOTE — Assessment & Plan Note (Signed)
Breathing stable.  No sob.   

## 2024-02-21 NOTE — Assessment & Plan Note (Signed)
 Continue lipitor and tricor . Reviewed recent lipid panel. Triglycerides improved - recent check 210. Low carb diet and exercise. Follow lipid panel.  Lab Results  Component Value Date   CHOL 143 02/15/2024   HDL 32.70 (L) 02/15/2024   LDLCALC 68 02/15/2024   LDLDIRECT 53.0 06/12/2023   TRIG 210.0 (H) 02/15/2024   CHOLHDL 4 02/15/2024

## 2024-02-21 NOTE — Assessment & Plan Note (Signed)
 Coronary calcium  score of 61. This was 47th percentile for age, sex, and race matched control. 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 There is a retro-aortic course with only one high risk feature: Acute angle take off. CAD-RADS 2. Mild non-obstructive CAD (25-49%). Continue risk factor modification. Denies chest pain or sob.

## 2024-02-22 ENCOUNTER — Other Ambulatory Visit: Payer: Self-pay | Admitting: Internal Medicine

## 2024-02-29 ENCOUNTER — Other Ambulatory Visit: Payer: Self-pay

## 2024-02-29 ENCOUNTER — Encounter: Payer: Self-pay | Admitting: Pulmonary Disease

## 2024-02-29 ENCOUNTER — Ambulatory Visit: Admitting: Pulmonary Disease

## 2024-02-29 VITALS — BP 116/70 | HR 73 | Temp 97.6°F | Ht 70.0 in | Wt 179.8 lb

## 2024-02-29 DIAGNOSIS — Z87891 Personal history of nicotine dependence: Secondary | ICD-10-CM

## 2024-02-29 DIAGNOSIS — Z122 Encounter for screening for malignant neoplasm of respiratory organs: Secondary | ICD-10-CM

## 2024-02-29 DIAGNOSIS — J4489 Other specified chronic obstructive pulmonary disease: Secondary | ICD-10-CM

## 2024-02-29 DIAGNOSIS — J84115 Respiratory bronchiolitis interstitial lung disease: Secondary | ICD-10-CM

## 2024-02-29 DIAGNOSIS — F17210 Nicotine dependence, cigarettes, uncomplicated: Secondary | ICD-10-CM

## 2024-02-29 DIAGNOSIS — J449 Chronic obstructive pulmonary disease, unspecified: Secondary | ICD-10-CM

## 2024-02-29 DIAGNOSIS — J8489 Other specified interstitial pulmonary diseases: Secondary | ICD-10-CM

## 2024-02-29 NOTE — Patient Instructions (Signed)
 VISIT SUMMARY:  During your visit, we discussed your lung function and medication management for your asthma and COPD overlap syndrome, as well as your ongoing monitoring for lung nodules.  YOUR PLAN:  -ASTHMA-COPD OVERLAP SYNDROME: Asthma-COPD overlap syndrome is a condition where both asthma and chronic obstructive pulmonary disease (COPD) are present. Your lung function improved from 55% to 70% with inhaler use, indicating an asthma component. You should use Trelegy daily to maintain your lung function and keep your emergency inhaler available for acute symptoms. We will schedule breathing tests to assess your current lung function and follow up in three months to reassess your condition.  -PULMONARY NODULE: A pulmonary nodule is a small growth in the lung. You have no post-surgical complications and we will set you up to continue with your annual lung cancer screening program. We have sent a notice to the lung cancer screening program to ensure your continued participation.  INSTRUCTIONS:  Please use Trelegy daily as prescribed and keep your emergency inhaler available. We will schedule breathing tests to assess your current lung function. Follow up in three months to reassess your condition.

## 2024-02-29 NOTE — Progress Notes (Unsigned)
 "  Subjective:    Patient ID: Travis Gray, male    DOB: January 07, 1956, 69 y.o.   MRN: [id number]  Patient Care Team: Glendia Shad, MD as PCP - General (Internal Medicine) Green, Davina E, RN as [facility] Care Management Nahser, Aleene PARAS, MD (Inactive) as Consulting Physician (Cardiology) Marea Selinda RAMAN, MD as Referring Physician (Vascular Surgery) Toledo, Teodoro K, MD as Consulting Physician (Gastroenterology)  Chief Complaint  Patient presents with   COPD    No breathing problems. Has not been using Trelegy. Has only filled once since 10/2022.     BACKGROUND/INTERVAL:  HPI   Review of Systems A 10 point review of systems was performed and it is as noted above otherwise negative.   Patient Active Problem List   Diagnosis Date Noted   Right knee pain 06/19/2023   Influenza 03/13/2023   Leukopenia 03/08/2023   Hypokalemia 03/08/2023   COPD (chronic obstructive pulmonary disease) (HCC) 11/16/2022   CAD (coronary artery disease) 09/23/2022   Nasal congestion 06/19/2022   Premature beats 06/06/2022   BPH (benign prostatic hyperplasia) 03/30/2022   Fall 05/14/2021   Low back pain 02/10/2021   Lung nodule 11/07/2020   Bradycardia 11/07/2020   Cerumen impaction 05/03/2020   Hyperglycemia 11/10/2019   Obesity    History of COVID-19 10/09/2019   Stress 08/26/2019   Chronic prostatitis 01/19/2019   Right leg pain 02/18/2018   Degeneration of cervical intervertebral disc 01/31/2018   Aortic atherosclerosis 10/29/2017   Vision changes 10/29/2017   Genetic testing 03/20/2017   Sebaceous adenoma 03/06/2017   Sleep apnea 02/27/2017   H/O hernia repair 02/27/2017   Head injury 02/27/2017   Cataract 02/27/2017   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   Carpal tunnel syndrome of right wrist 02/14/2017   Right arm numbness 01/23/2017   Abdominal pain 08/22/2016   GERD (gastroesophageal reflux disease) 08/22/2016   Anemia 01/21/2016   Peripheral vascular disease  05/03/2015   Scalp lesion 08/28/2014   Chest pain 04/27/2014   Right hip pain 04/27/2014   Health care maintenance 04/27/2014   Numbness and tingling in both hands 03/23/2014   Muscle cramps 03/23/2014   Back pain 03/23/2014   Personal history of other malignant neoplasm of skin 11/11/2013   Left elbow pain 06/18/2013   Right elbow pain 12/02/2012   History of colonic polyps 03/07/2012   Tobacco abuse 03/07/2012   Hypercholesterolemia 03/02/2012   Headache 03/02/2012   Essential hypertension, benign 03/02/2012    Social History   Tobacco Use   Smoking status: Former    Current packs/day: 0.00    Average packs/day: 0.5 packs/day for 37.0 years (18.5 ttl pk-yrs)    Types: Cigarettes    Quit date: 09/02/2022    Years since quitting: 1.4    Passive exposure: Past   Smokeless tobacco: Never   Tobacco comments:    1 pack will last 2-3 days--08/15/2022  Substance Use Topics   Alcohol use: Yes    Alcohol/week: 0.0 standard drinks of alcohol    Comment: very occasional alcohol use - 3-4x/yr    Allergies[1]  Active Medications[2]  Immunization History  Administered Date(s) Administered   Influenza,inj,Quad PF,6+ Mos 10/26/2017        Objective:     Vitals:   02/29/24 1446  BP: 116/70  Pulse: 73  Temp: 97.6 F (36.4 C)  Height: 5' 10 (1.778 m)  Weight: 179 lb 12.8 oz (81.6 kg)  SpO2: 96%  TempSrc: Temporal  BMI (Calculated): 25.8  SpO2: 96 %  GENERAL: HEAD: Normocephalic, atraumatic.  EYES: Pupils equal, round, reactive to light.  No scleral icterus.  MOUTH:  NECK: Supple. No thyromegaly. Trachea midline. No JVD.  No adenopathy. PULMONARY: Good air entry bilaterally.  No adventitious sounds. CARDIOVASCULAR: S1 and S2. Regular rate and rhythm.  ABDOMEN: MUSCULOSKELETAL: No joint deformity, no clubbing, no edema.  NEUROLOGIC:  SKIN: Intact,warm,dry. PSYCH:        Assessment & Plan:   No diagnosis found.  No orders of the defined types were  placed in this encounter.   No orders of the defined types were placed in this encounter.     Advised if symptoms do not improve or worsen, to please contact office for sooner follow up or seek emergency care.    I spent xxx minutes of dedicated to the care of this patient on the date of this encounter to include pre-visit review of records, face-to-face time with the patient discussing conditions above, post visit ordering of testing, clinical documentation with the electronic health record, making appropriate referrals as documented, and communicating necessary findings to members of the patients care team.     C. Leita Sanders, MD Advanced Bronchoscopy [facility]    *This note was generated using voice recognition software/Dragon and/or AI transcription program.  Despite best efforts to proofread, errors can occur which can change the meaning. Any transcriptional errors that result from this process are unintentional and may not be fully corrected at the time of dictation.    [1] No Known Allergies [2]  Current Meds  Medication Sig   amitriptyline  (ELAVIL ) 25 MG tablet TAKE 1 TABLET BY MOUTH AT BEDTIME   atorvastatin  (LIPITOR) 20 MG tablet TAKE 1 TABLET BY MOUTH AT BEDTIME   clopidogrel  (PLAVIX ) 75 MG tablet TAKE 1 TABLET BY MOUTH ONCE DAILY   DULoxetine  (CYMBALTA ) 30 MG capsule Take 1 capsule (30 mg total) by mouth daily.   fenofibrate  (TRICOR ) 145 MG tablet Take 1 tablet (145 mg total) by mouth daily.   Fluticasone-Umeclidin-Vilant (TRELEGY ELLIPTA ) 100-62.5-25 MCG/ACT AEPB Inhale 1 puff into the lungs daily. (Patient taking differently: Inhale 1 puff into the lungs as needed.)   hydrochlorothiazide  (HYDRODIURIL ) 25 MG tablet Take 1 tablet (25 mg total) by mouth daily.   losartan  (COZAAR ) 100 MG tablet Take 1 tablet (100 mg total) by mouth daily.   metoprolol  succinate (TOPROL -XL) 25 MG 24 hr tablet Take 1 tablet (25 mg total) by mouth daily.    Omega-3 Fatty Acids (FISH OIL PO) Take 2 capsules by mouth in the morning.   pantoprazole  (PROTONIX ) 40 MG tablet Take 1 tablet (40 mg total) by mouth daily.   vitamin B-12 (CYANOCOBALAMIN ) 1000 MCG tablet Take 1,000 mcg by mouth in the morning.   "

## 2024-03-01 ENCOUNTER — Encounter: Payer: Self-pay | Admitting: Internal Medicine

## 2024-03-27 ENCOUNTER — Encounter

## 2024-05-30 ENCOUNTER — Ambulatory Visit: Admitting: Pulmonary Disease

## 2024-06-05 ENCOUNTER — Ambulatory Visit

## 2024-06-26 ENCOUNTER — Other Ambulatory Visit

## 2024-06-28 ENCOUNTER — Ambulatory Visit: Admitting: Internal Medicine

## 2024-10-21 ENCOUNTER — Other Ambulatory Visit

## 2024-10-23 ENCOUNTER — Encounter: Admitting: Internal Medicine

## 2025-02-07 ENCOUNTER — Encounter (INDEPENDENT_AMBULATORY_CARE_PROVIDER_SITE_OTHER)

## 2025-02-07 ENCOUNTER — Ambulatory Visit (INDEPENDENT_AMBULATORY_CARE_PROVIDER_SITE_OTHER): Admitting: Vascular Surgery
